# Patient Record
Sex: Male | Born: 1965
Health system: Southern US, Community
[De-identification: ages and names within clinical notes are randomized; demographics above are authoritative.]

## PROBLEM LIST (undated history)

## (undated) DIAGNOSIS — C799 Secondary malignant neoplasm of unspecified site: Principal | ICD-10-CM

## (undated) DIAGNOSIS — C787 Secondary malignant neoplasm of liver and intrahepatic bile duct: Secondary | ICD-10-CM

## (undated) DIAGNOSIS — C160 Malignant neoplasm of cardia: Principal | ICD-10-CM

## (undated) DIAGNOSIS — Z7189 Other specified counseling: Secondary | ICD-10-CM

## (undated) DIAGNOSIS — D500 Iron deficiency anemia secondary to blood loss (chronic): Secondary | ICD-10-CM

## (undated) HISTORY — DX: Iron deficiency anemia secondary to blood loss (chronic): D50.0

## (undated) HISTORY — DX: Malignant neoplasm of cardia: C16.0

## (undated) HISTORY — DX: Secondary malignant neoplasm of unspecified site: C79.9

## (undated) HISTORY — DX: Other specified counseling: Z71.89

## (undated) HISTORY — DX: Secondary malignant neoplasm of liver and intrahepatic bile duct: C78.7

---

## 2015-12-20 ENCOUNTER — Ambulatory Visit: Payer: Self-pay | Admitting: General Surgery

## 2016-01-08 NOTE — Pre-Procedure Instructions (Addendum)
Matthew Reynolds  01/08/2016      [id number] [place], Matthew [place] Phone: [phone] Fax: [phone]    Your procedure is scheduled on Monday, October 30.  Report to [facility] Admitting at 5:30 AM                 Your surgery or procedure is scheduled for 7:30 AM   Call this number if you have problems the morning of [phone]                  For any other questions, please call [phone], Monday - Friday 8 AM - 4 PM.   Remember:  Do not eat food or drink liquids after midnight Sunday, October 30.  Take these medicines the morning of surgery with A SIP OF WATER: cetirizine (ZYRTEC).                 [place]- Preparing For Surgery  Before surgery, you can play an important role. Because skin is not sterile, your skin needs to be as free of germs as possible. You can reduce the number of germs on your skin by washing with CHG (chlorahexidine gluconate) Soap before surgery.  CHG is an antiseptic cleaner which kills germs and bonds with the skin to continue killing germs even after washing.  Please do not use if you have an allergy to CHG or antibacterial soaps. If your skin becomes reddened/irritated stop using the CHG.  Do not shave (including legs and underarms) for at least 48 hours prior to first CHG shower. It is OK to shave your face.  Please follow these instructions carefully.   1. Shower the NIGHT BEFORE SURGERY and the MORNING OF SURGERY with CHG.   2. If you chose to wash your hair, wash your hair first as usual with your normal shampoo.  3. After you shampoo, rinse your hair and body thoroughly to remove the shampoo.  4. Use CHG as you would any other liquid soap. You can apply CHG directly to the skin and wash gently with a scrungie or a clean washcloth.   5. Apply the CHG Soap to your body ONLY FROM THE NECK DOWN.  Do not use on open wounds or open sores. Avoid  contact with your eyes, ears, mouth and genitals (private parts). Wash genitals (private parts) with your normal soap.  6. Wash thoroughly, paying special attention to the area where your surgery will be performed.  7. Thoroughly rinse your body with warm water from the neck down.  8. DO NOT shower/wash with your normal soap after using and rinsing off the CHG Soap.  9. Pat yourself dry with a CLEAN TOWEL.   10. Wear CLEAN PAJAMAS   11. Place CLEAN SHEETS on your bed the night of your first shower and DO NOT SLEEP WITH PETS. Day of Surgery: Do not apply any deodorants/lotions. Please wear clean clothes to the hospital/surgery center.     Do not wear jewelry, make-up or nail polish.  Do not wear lotions, powders, or perfumes, or deodoorant.             Men may shave face and neck.  Do not bring valuables to the hospital.  [facility] is not responsible for any belongings or valuables.  Contacts, dentures or bridgework may not be worn into surgery.  Leave your suitcase in the car.  After surgery it  may be brought to your room.  For patients admitted to the hospital, discharge time will be determined by your treatment team.  Patients discharged the day of surgery will not be allowed to drive home.   Name and phone number of your driver: Matthew Reynolds, spouse Special instructions-  Please read over the following fact sheets that you were given: Coughing and Deep Breathing

## 2016-01-08 NOTE — Pre-Procedure Instructions (Signed)
LEBARON BAUTCH  01/08/2016      [facility] [place], [place] [address] [place] [address] Phone: [phone] Fax: [phone]    Your procedure is scheduled on Monday, October 30.  Report to [facility] Admitting at 5:30 AM .  Call this number if you have problems the morning of surgery:[phone]                For any other questions, please call [phone], Monday - Friday 8 AM - 4 PM.  Remember:  Do not eat food or drink liquids after midnight Sunday 29.  Take these medicines the morning of surgery with A SIP OF WATER: Take if needed:cetirizine (ZYRTEC).                 1 Week prior to surgery STOP taking Aspirin , Aspirin Products (Goody Powder, Excedrin Migraine), Ibuprofen (Advil), Naproxen (Aleve), Vitamins and Herbal Products (ie Fish Oil)                 [place]- Preparing For Surgery  Before surgery, you can play an important role. Because skin is not sterile, your skin needs to be as free of germs as possible. You can reduce the number of germs on your skin by washing with CHG (chlorahexidine gluconate) Soap before surgery.  CHG is an antiseptic cleaner which kills germs and bonds with the skin to continue killing germs even after washing.  Please do not use if you have an allergy to CHG or antibacterial soaps. If your skin becomes reddened/irritated stop using the CHG.  Do not shave (including legs and underarms) for at least 48 hours prior to first CHG shower. It is OK to shave your face.  Please follow these instructions carefully.   1. Shower the NIGHT BEFORE SURGERY and the MORNING OF SURGERY with CHG.   2. If you chose to wash your hair, wash your hair first as usual with your normal shampoo.  3. After you shampoo, rinse your hair and body thoroughly to remove the shampoo.  4. Use CHG as you would any other liquid soap. You can apply CHG directly to the skin and wash gently with a scrungie or a  clean washcloth.   5. Apply the CHG Soap to your body ONLY FROM THE NECK DOWN.  Do not use on open wounds or open sores. Avoid contact with your eyes, ears, mouth and genitals (private parts). Wash genitals (private parts) with your normal soap.  6. Wash thoroughly, paying special attention to the area where your surgery will be performed.  7. Thoroughly rinse your body with warm water from the neck down.  8. DO NOT shower/wash with your normal soap after using and rinsing off the CHG Soap.  9. Pat yourself dry with a CLEAN TOWEL.   10. Wear CLEAN PAJAMAS   11. Place CLEAN SHEETS on your bed the night of your first shower and DO NOT SLEEP WITH PETS. 12.  Day of Surgery: Do not apply any deodorants/lotions. Please wear clean clothes to the hospital/surgery center.     Do not wear jewelry, make-up or nail polish.  Do not wear lotions, powders, or perfumes, or deodorant.   Men may shave face and neck.  Do not bring valuables to the hospital.  [facility] is not responsible for any belongings or valuables.  Contacts, dentures or bridgework may not be worn into surgery.  Leave your suitcase  in the car.  After surgery it may be brought to your room.  For patients admitted to the hospital, discharge time will be determined by your treatment team.  Patients discharged the day of surgery will not be allowed to drive home.  Name and phone number of your driver:    Special instructions:-   Please read over the following fact sheets that you were given

## 2016-01-09 ENCOUNTER — Encounter (HOSPITAL_COMMUNITY): Payer: Self-pay

## 2016-01-09 ENCOUNTER — Encounter (HOSPITAL_COMMUNITY)
Admission: RE | Admit: 2016-01-09 | Discharge: 2016-01-09 | Disposition: A | Discharge status: Home / Self Care | Payer: 59 | Source: Ambulatory Visit | Attending: General Surgery | Admitting: General Surgery

## 2016-01-09 DIAGNOSIS — L723 Sebaceous cyst: Secondary | ICD-10-CM | POA: Diagnosis not present

## 2016-01-09 LAB — CBC
HEMATOCRIT: 51.3 % (ref 39.0–52.0)
Hemoglobin: 17.9 g/dL — ABNORMAL HIGH (ref 13.0–17.0)
MCH: 30.5 pg (ref 26.0–34.0)
MCHC: 34.9 g/dL (ref 30.0–36.0)
MCV: 87.5 fL (ref 78.0–100.0)
Platelets: 168 10*3/uL (ref 150–400)
RBC: 5.86 MIL/uL — ABNORMAL HIGH (ref 4.22–5.81)
RDW: 12.9 % (ref 11.5–15.5)
WBC: 4.6 10*3/uL (ref 4.0–10.5)

## 2016-01-09 NOTE — Progress Notes (Signed)
PCP:Dr. Theadore Nan @ [facility] [address]

## 2016-01-15 ENCOUNTER — Encounter (HOSPITAL_COMMUNITY)
Admission: RE | Disposition: A | Discharge status: Home / Self Care | Payer: Self-pay | Source: Ambulatory Visit | Attending: General Surgery

## 2016-01-15 ENCOUNTER — Ambulatory Visit (HOSPITAL_COMMUNITY): Payer: 59 | Admitting: Vascular Surgery

## 2016-01-15 ENCOUNTER — Ambulatory Visit (HOSPITAL_COMMUNITY): Payer: 59 | Admitting: Anesthesiology

## 2016-01-15 ENCOUNTER — Ambulatory Visit (HOSPITAL_COMMUNITY)
Admission: RE | Admit: 2016-01-15 | Discharge: 2016-01-15 | Disposition: A | Discharge status: Home / Self Care | Payer: 59 | Source: Ambulatory Visit | Attending: General Surgery | Admitting: General Surgery

## 2016-01-15 ENCOUNTER — Encounter (HOSPITAL_COMMUNITY): Payer: Self-pay | Admitting: Anesthesiology

## 2016-01-15 DIAGNOSIS — L723 Sebaceous cyst: Secondary | ICD-10-CM | POA: Diagnosis present

## 2016-01-15 HISTORY — PX: CYST EXCISION: SHX5701

## 2016-01-15 SURGERY — CYST REMOVAL
Anesthesia: General | Site: Back | Laterality: Left

## 2016-01-15 MED ORDER — FENTANYL CITRATE (PF) 100 MCG/2ML IJ SOLN
INTRAMUSCULAR | Status: AC
  Filled 2016-01-15: qty 2

## 2016-01-15 MED ORDER — ONDANSETRON HCL 4 MG/2ML IJ SOLN
INTRAMUSCULAR | Status: DC | PRN
  Administered 2016-01-15: 4 mg via INTRAVENOUS

## 2016-01-15 MED ORDER — FENTANYL CITRATE (PF) 100 MCG/2ML IJ SOLN: 25.0000 ug | INTRAMUSCULAR | Status: DC | PRN

## 2016-01-15 MED ORDER — PROPOFOL 10 MG/ML IV BOLUS
INTRAVENOUS | Status: AC
  Filled 2016-01-15: qty 20

## 2016-01-15 MED ORDER — CEFAZOLIN SODIUM-DEXTROSE 2-4 GM/100ML-% IV SOLN
2.0000 g | INTRAVENOUS | Status: AC
  Administered 2016-01-15: 2 g via INTRAVENOUS
  Filled 2016-01-15: qty 100

## 2016-01-15 MED ORDER — OXYCODONE HCL 5 MG PO TABS: 5.0000 mg | ORAL_TABLET | Freq: Four times a day (QID) | ORAL | 0 refills | Status: DC | PRN

## 2016-01-15 MED ORDER — OXYCODONE HCL 5 MG PO TABS: 5.0000 mg | ORAL_TABLET | Freq: Once | ORAL | Status: DC | PRN

## 2016-01-15 MED ORDER — BUPIVACAINE HCL (PF) 0.25 % IJ SOLN
INTRAMUSCULAR | Status: AC
  Filled 2016-01-15: qty 30

## 2016-01-15 MED ORDER — LIDOCAINE HCL (CARDIAC) 20 MG/ML IV SOLN
INTRAVENOUS | Status: DC | PRN
  Administered 2016-01-15: 60 mg via INTRAVENOUS

## 2016-01-15 MED ORDER — CHLORHEXIDINE GLUCONATE CLOTH 2 % EX PADS: 6.0000 | MEDICATED_PAD | Freq: Once | CUTANEOUS | Status: DC

## 2016-01-15 MED ORDER — MIDAZOLAM HCL 5 MG/5ML IJ SOLN
INTRAMUSCULAR | Status: DC | PRN
  Administered 2016-01-15: 2 mg via INTRAVENOUS

## 2016-01-15 MED ORDER — MIDAZOLAM HCL 2 MG/2ML IJ SOLN
INTRAMUSCULAR | Status: AC
  Filled 2016-01-15: qty 2

## 2016-01-15 MED ORDER — LACTATED RINGERS IV SOLN
INTRAVENOUS | Status: DC | PRN
  Administered 2016-01-15: 07:00:00 via INTRAVENOUS

## 2016-01-15 MED ORDER — SUGAMMADEX SODIUM 200 MG/2ML IV SOLN
INTRAVENOUS | Status: AC
  Filled 2016-01-15: qty 2

## 2016-01-15 MED ORDER — EPHEDRINE 5 MG/ML INJ
INTRAVENOUS | Status: AC
  Filled 2016-01-15: qty 10

## 2016-01-15 MED ORDER — BUPIVACAINE-EPINEPHRINE 0.25% -1:200000 IJ SOLN
INTRAMUSCULAR | Status: DC | PRN
  Administered 2016-01-15: 20 mL

## 2016-01-15 MED ORDER — FENTANYL CITRATE (PF) 100 MCG/2ML IJ SOLN
INTRAMUSCULAR | Status: DC | PRN
  Administered 2016-01-15: 100 ug via INTRAVENOUS
  Administered 2016-01-15: 50 ug via INTRAVENOUS

## 2016-01-15 MED ORDER — SUGAMMADEX SODIUM 200 MG/2ML IV SOLN
INTRAVENOUS | Status: DC | PRN
Start: 2016-01-15 — End: 2016-01-15
  Administered 2016-01-15: 200 mg via INTRAVENOUS

## 2016-01-15 MED ORDER — PROPOFOL 10 MG/ML IV BOLUS
INTRAVENOUS | Status: DC | PRN
  Administered 2016-01-15: 200 mg via INTRAVENOUS

## 2016-01-15 MED ORDER — 0.9 % SODIUM CHLORIDE (POUR BTL) OPTIME
TOPICAL | Status: DC | PRN
  Administered 2016-01-15: 1000 mL

## 2016-01-15 MED ORDER — OXYCODONE HCL 5 MG/5ML PO SOLN: 5.0000 mg | Freq: Once | ORAL | Status: DC | PRN

## 2016-01-15 MED ORDER — ARTIFICIAL TEARS OP OINT
TOPICAL_OINTMENT | OPHTHALMIC | Status: AC
  Filled 2016-01-15: qty 3.5

## 2016-01-15 MED ORDER — ROCURONIUM BROMIDE 10 MG/ML (PF) SYRINGE
PREFILLED_SYRINGE | INTRAVENOUS | Status: AC
  Filled 2016-01-15: qty 10

## 2016-01-15 MED ORDER — LIDOCAINE 2% (20 MG/ML) 5 ML SYRINGE
INTRAMUSCULAR | Status: AC
  Filled 2016-01-15: qty 5

## 2016-01-15 MED ORDER — ARTIFICIAL TEARS OP OINT
TOPICAL_OINTMENT | OPHTHALMIC | Status: DC | PRN
  Administered 2016-01-15: 1 via OPHTHALMIC

## 2016-01-15 MED ORDER — ONDANSETRON HCL 4 MG/2ML IJ SOLN
INTRAMUSCULAR | Status: AC
  Filled 2016-01-15: qty 2

## 2016-01-15 MED ORDER — ROCURONIUM BROMIDE 100 MG/10ML IV SOLN
INTRAVENOUS | Status: DC | PRN
  Administered 2016-01-15: 40 mg via INTRAVENOUS

## 2016-01-15 SURGICAL SUPPLY — 36 items
BLADE SURG ROTATE 9660 (MISCELLANEOUS) IMPLANT
CANISTER SUCTION 2500CC (MISCELLANEOUS) ×2 IMPLANT
CHLORAPREP W/TINT 26ML (MISCELLANEOUS) ×2 IMPLANT
COVER SURGICAL LIGHT HANDLE (MISCELLANEOUS) ×2 IMPLANT
DECANTER SPIKE VIAL GLASS SM (MISCELLANEOUS) ×2 IMPLANT
DERMABOND ADHESIVE PROPEN (GAUZE/BANDAGES/DRESSINGS) ×1
DERMABOND ADVANCED .7 DNX6 (GAUZE/BANDAGES/DRESSINGS) ×1 IMPLANT
DRAPE HALF SHEET 40X57 (DRAPES) ×2 IMPLANT
DRAPE LAPAROSCOPIC ABDOMINAL (DRAPES) ×2 IMPLANT
DRAPE UTILITY XL STRL (DRAPES) IMPLANT
ELECT CAUTERY BLADE 6.4 (BLADE) ×2 IMPLANT
ELECT REM PT RETURN 9FT ADLT (ELECTROSURGICAL) ×2
ELECTRODE REM PT RTRN 9FT ADLT (ELECTROSURGICAL) ×1 IMPLANT
GAUZE SPONGE 4X4 12PLY STRL (GAUZE/BANDAGES/DRESSINGS) ×2 IMPLANT
GLOVE BIO SURGEON STRL SZ8 (GLOVE) ×2 IMPLANT
GLOVE BIOGEL PI IND STRL 8 (GLOVE) ×1 IMPLANT
GLOVE BIOGEL PI INDICATOR 8 (GLOVE) ×1
GOWN STRL REUS W/ TWL LRG LVL3 (GOWN DISPOSABLE) ×1 IMPLANT
GOWN STRL REUS W/ TWL XL LVL3 (GOWN DISPOSABLE) ×1 IMPLANT
GOWN STRL REUS W/TWL LRG LVL3 (GOWN DISPOSABLE) ×1
GOWN STRL REUS W/TWL XL LVL3 (GOWN DISPOSABLE) ×1
KIT BASIN OR (CUSTOM PROCEDURE TRAY) ×2 IMPLANT
KIT ROOM TURNOVER OR (KITS) ×2 IMPLANT
NEEDLE 22X1 1/2 (OR ONLY) (NEEDLE) ×2 IMPLANT
NS IRRIG 1000ML POUR BTL (IV SOLUTION) ×2 IMPLANT
PACK GENERAL/GYN (CUSTOM PROCEDURE TRAY) ×2 IMPLANT
PAD ARMBOARD 7.5X6 YLW CONV (MISCELLANEOUS) ×6 IMPLANT
SPECIMEN JAR MEDIUM (MISCELLANEOUS) ×2 IMPLANT
SUT MNCRL AB 4-0 PS2 18 (SUTURE) ×2 IMPLANT
SUT VIC AB 3-0 SH 27 (SUTURE) ×1
SUT VIC AB 3-0 SH 27X BRD (SUTURE) ×1 IMPLANT
SUT VICRYL AB 3 0 TIES (SUTURE) IMPLANT
SYR CONTROL 10ML LL (SYRINGE) IMPLANT
TOWEL OR 17X24 6PK STRL BLUE (TOWEL DISPOSABLE) IMPLANT
TOWEL OR 17X26 10 PK STRL BLUE (TOWEL DISPOSABLE) ×2 IMPLANT
WATER STERILE IRR 1000ML POUR (IV SOLUTION) ×2 IMPLANT

## 2016-01-15 NOTE — Anesthesia Preprocedure Evaluation (Addendum)
Anesthesia Evaluation  Patient identified by MRN, date of birth, ID band Patient awake    Reviewed: Allergy & Precautions, NPO status , Patient's Chart, lab work & pertinent test results  Airway Mallampati: I   Neck ROM: Full    Dental  (+) Teeth Intact, Dental Advisory Given   Pulmonary    breath sounds clear to auscultation       Cardiovascular  Rhythm:Regular     Neuro/Psych    GI/Hepatic   Endo/Other    Renal/GU      Musculoskeletal   Abdominal   Peds  Hematology   Anesthesia Other Findings   Reproductive/Obstetrics                            Anesthesia Physical Anesthesia Plan  ASA: II  Anesthesia Plan: General   Post-op Pain Management:    Induction: Intravenous  Airway Management Planned: Oral ETT  Additional Equipment: None  Intra-op Plan:   Post-operative Plan: Extubation in OR  Informed Consent: I have reviewed the patients History and Physical, chart, labs and discussed the procedure including the risks, benefits and alternatives for the proposed anesthesia with the patient or authorized representative who has indicated his/her understanding and acceptance.   Dental advisory given  Plan Discussed with: CRNA and Surgeon  Anesthesia Plan Comments:         Anesthesia Quick Evaluation

## 2016-01-15 NOTE — Transfer of Care (Signed)
Immediate Anesthesia Transfer of Care Note  Patient: Matthew Reynolds  Procedure(s) Performed: Procedure(s): EXCISION SEBACEOUS CYST LEFT UPPER BACK (Left)  Patient Location: PACU  Anesthesia Type:General  Level of Consciousness: awake, alert  and oriented  Airway & Oxygen Therapy: Patient Spontanous Breathing and Patient connected to nasal cannula oxygen  Post-op Assessment: Report given to RN, Post -op Vital signs reviewed and stable and Patient moving all extremities X 4  Post vital signs: Reviewed and stable  Last Vitals:  Vitals:   01/15/16 0607  BP: (!) 153/93  Pulse: 64  Resp: 20  Temp: 36.8 C    Last Pain:  Vitals:   01/15/16 0607  TempSrc: Oral      Patients Stated Pain Goal: 4 (90/93/11 2162)  Complications: No apparent anesthesia complications

## 2016-01-15 NOTE — H&P (Signed)
Matthew Reynolds 12/20/2015 10:50 AM Location: [facility] Patient #: [id number] DOB: Feb 13, 1966 Married / Language: English / Race: White Male   History of Present Illness Matthew Neri E. Grandville Silos MD; 12/20/2015 11:00 AM) The patient is a 50 year old male who presents with a complaint of Mass. I was asked to see Othell in consultation by Dr. Addison Lank regarding a sebaceous cyst on his upper back. This is on the left side of his upper back. His neck. He has had for many years. It is gradually gotten larger. It is becoming more uncomfortable. It has never become infected nor drained anything from the skin. 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 Frequently when he has some cervical strain related to work, this exacerbates the discomfort at the cyst site.   Allergies Elbert Ewings, CMA; 12/20/2015 10:51 AM) No Known Drug Allergies10/06/2015  Medication History Elbert Ewings, [place]; 12/20/2015 10:51 AM) No Current Medications Medications Reconciled    Review of Systems Elbert Ewings CMA; 12/20/2015 10:51 AM) General Not Present- Appetite Loss, Chills, Fatigue, Fever, Night Sweats, Weight Gain and Weight Loss. Skin Not Present- Change in Wart/Mole, Dryness, Hives, Jaundice, New Lesions, Non-Healing Wounds, Rash and Ulcer. HEENT Present- Seasonal Allergies and Wears glasses/contact lenses. Not Present- Earache, Hearing Loss, Hoarseness, Nose Bleed, Oral Ulcers, Ringing in the Ears, Sinus Pain, Sore Throat, Visual Disturbances and Yellow Eyes. Respiratory Not Present- Bloody sputum, Chronic Cough, Difficulty Breathing, Snoring and Wheezing. Breast Not Present- Breast Mass, Breast Pain, Nipple Discharge and Skin Changes. Cardiovascular Not Present- Chest Pain, Difficulty Breathing Lying Down, Leg Cramps, Palpitations, Rapid Heart Rate, Shortness of Breath and Swelling of Extremities. Gastrointestinal Not Present- Abdominal Pain, Bloating, Bloody Stool, Change in Bowel Habits, Chronic  diarrhea, Constipation, Difficulty Swallowing, Excessive gas, Gets full quickly at meals, Hemorrhoids, Indigestion, Nausea, Rectal Pain and Vomiting. Male Genitourinary Not Present- Blood in Urine, Change in Urinary Stream, Frequency, Impotence, Nocturia, Painful Urination, Urgency and Urine Leakage. Musculoskeletal Not Present- Back Pain, Joint Pain, Joint Stiffness, Muscle Pain, Muscle Weakness and Swelling of Extremities. Neurological Not Present- Decreased Memory, Fainting, Headaches, Numbness, Seizures, Tingling, Tremor, Trouble walking and Weakness. Psychiatric Not Present- Anxiety, Bipolar, Change in Sleep Pattern, Depression, Fearful and Frequent crying. Endocrine Not Present- Cold Intolerance, Excessive Hunger, Hair Changes, Heat Intolerance, Hot flashes and New Diabetes. Hematology Not Present- Blood Thinners, Easy Bruising, Excessive bleeding, Gland problems, HIV and Persistent Infections.  Vitals Elbert Ewings CMA; 12/20/2015 10:51 AM) 12/20/2015 10:51 AM Weight: 222 lb Height: 69in Body Surface Area: 2.16 m Body Mass Index: 32.78 kg/m  Temp.: 98.30F(Temporal)  Pulse: 95 (Regular)  BP: 128/86 (Sitting, Left Arm, Standard)       Physical Exam Matthew Neri E. Grandville Silos MD; 12/20/2015 11:00 AM) General Mental Status-Alert. General Appearance-Consistent with stated age. Hydration-Well hydrated. Voice-Normal.  Integumentary Note: Upper back on the left side near the base of the stomach has a 3 cm subcutaneous mass, no overlying cellulitis, no drainage   Head and Neck Head-normocephalic, atraumatic with no lesions or palpable masses. Trachea-midline. Thyroid Gland Characteristics - normal size and consistency.  Eye Eyeball - Bilateral-Extraocular movements intact. Sclera/Conjunctiva - Bilateral-No scleral icterus.  Chest and Lung Exam Chest and lung exam reveals -quiet, even and easy respiratory effort with no use of accessory muscles and on  auscultation, normal breath sounds, no adventitious sounds and normal vocal resonance. Inspection Chest Wall - Normal. Back - normal.  Cardiovascular Cardiovascular examination reveals -normal heart sounds, regular rate and rhythm with no murmurs and normal pedal pulses  bilaterally.  Abdomen Inspection Inspection of the abdomen reveals - No Hernias. Palpation/Percussion Palpation and Percussion of the abdomen reveal - Soft, Non Tender, No Rebound tenderness, No Rigidity (guarding) and No hepatosplenomegaly. Auscultation Auscultation of the abdomen reveals - Bowel sounds normal.  Neurologic Neurologic evaluation reveals -alert and oriented x 3 with no impairment of recent or remote memory. Mental Status-Normal.  Musculoskeletal Global Assessment -Note: no gross deformities.  Normal Exam - Left-Upper Extremity Strength Normal and Lower Extremity Strength Normal. Normal Exam - Right-Upper Extremity Strength Normal and Lower Extremity Strength Normal.  Lymphatic Head & Neck  General Head & Neck Lymphatics: Bilateral - Description - Normal. Axillary  General Axillary Region: Bilateral - Description - Normal. Tenderness - Non Tender. Femoral & Inguinal  Generalized Femoral & Inguinal Lymphatics: Bilateral - Description - No Generalized lymphadenopathy.    Assessment & Plan Matthew Neri E. Grandville Silos MD; 12/20/2015 11:01 AM) SEBACEOUS CYST (L72.3) Impression: Upper left back. I have offered excision under general anesthesia. Procedure, risks, and benefits were discussed in detail with him. He is agreeable. We discussed the expected postoperative course and I answered his questions.

## 2016-01-15 NOTE — Interval H&P Note (Signed)
History and Physical Interval Note:  01/15/2016 6:59 AM  Matthew Reynolds  has presented today for surgery, with the diagnosis of sebaceous cyst left upper back  The various methods of treatment have been discussed with the patient and family. After consideration of risks, benefits and other options for treatment, the patient has consented to  Procedure(s): EXCISION SEBACEOUS CYST UPPER BACK (N/A) as a surgical intervention .  The patient's history has been reviewed, patient examined, no change in status, stable for surgery.  I have reviewed the patient's chart and labs.  Questions were answered to the patient's satisfaction.     Graydon Fofana E

## 2016-01-15 NOTE — Op Note (Signed)
01/15/2016  8:07 AM  PATIENT:  Matthew Reynolds  50 y.o. male  PRE-OPERATIVE DIAGNOSIS:  sebaceous cyst left upper back  POST-OPERATIVE DIAGNOSIS:  sebaceous cyst left upper back  PROCEDURE:  Procedure(s): EXCISION SEBACEOUS CYST LEFT UPPER BACK 3CM WITH LAYERED CLOSURE  SURGEON:  Surgeon(s): Georganna Skeans, MD  ASSISTANTS: none   ANESTHESIA:   local and general  EBL:  Total I/O In: -  Out: 5 [Blood:5]  BLOOD ADMINISTERED:none  DRAINS: none   SPECIMEN:  Excision  DISPOSITION OF SPECIMEN:  PATHOLOGY  COUNTS:  YES  DICTATION: .Dragon Dictation [name] presents for excision sebaceous cyst left upper back. Informed consent was obtained. His site was marked. He received intravenous antibiotics. He was brought to operating room and general endotracheal anesthesia was administered by the anesthesia staff. He was placed in prone position. His upper back was prepped and draped in sterile fashion. We did time out procedure. Local anesthetic was injected around the cyst area. A transverse incision was made. Subcutaneous tissues were dissected down revealing the cyst wall. This was circumferentially dissected and excised in its entirety. It was sent to pathology. The wound was copiously irrigated. Hemostasis was ensured. Some additional local was injected. Deep tissues were approximated with interrupted 3-0 Vicryl. The skin was closed with running 4-0 Monocryl followed by Dermabond. All counts were correct. He tolerated the procedure well without apparent complication was taken recovery in stable condition. PATIENT DISPOSITION:  PACU - hemodynamically stable.   Delay start of Pharmacological VTE agent (>24hrs) due to surgical blood loss or risk of bleeding:  no  Georganna Skeans, MD, MPH, FACS Pager: [phone]  10/30/20178:07 AM

## 2016-01-15 NOTE — Anesthesia Procedure Notes (Signed)
Procedure Name: Intubation Date/Time: 01/15/2016 7:33 AM Performed by: Suzy Bouchard Pre-anesthesia Checklist: Patient identified, Emergency Drugs available, Suction available, Timeout performed and Patient being monitored Patient Re-evaluated:Patient Re-evaluated prior to inductionOxygen Delivery Method: Circle system utilized Preoxygenation: Pre-oxygenation with 100% oxygen Intubation Type: IV induction Ventilation: Mask ventilation without difficulty Laryngoscope Size: Miller and 2 Grade View: Grade I Tube type: Oral Laser Tube: Cuffed inflated with minimal occlusive pressure - saline Tube size: 7.5 mm Number of attempts: 1 Airway Equipment and Method: Stylet Placement Confirmation: ETT inserted through vocal cords under direct vision,  positive ETCO2 and breath sounds checked- equal and bilateral Secured at: 23 cm Tube secured with: Tape Dental Injury: Teeth and Oropharynx as per pre-operative assessment

## 2016-01-16 ENCOUNTER — Encounter (HOSPITAL_COMMUNITY): Payer: Self-pay | Admitting: General Surgery

## 2016-01-16 NOTE — Anesthesia Postprocedure Evaluation (Signed)
Anesthesia Post Note  Patient: Matthew Reynolds  Procedure(s) Performed: Procedure(s) (LRB): EXCISION SEBACEOUS CYST LEFT UPPER BACK (Left)  Patient location during evaluation: PACU Anesthesia Type: General Level of consciousness: awake Pain management: pain level controlled Vital Signs Assessment: post-procedure vital signs reviewed and stable Respiratory status: spontaneous breathing Cardiovascular status: stable Postop Assessment: no signs of nausea or vomiting Anesthetic complications: no    Last Vitals:  Vitals:   01/15/16 0900 01/15/16 0909  BP:  (!) 125/95  Pulse: 76 73  Resp: 19 20  Temp: 36.5 C     Last Pain:  Vitals:   01/15/16 0607  TempSrc: Oral                 Robby Bulkley

## 2016-06-19 DIAGNOSIS — S39012A Strain of muscle, fascia and tendon of lower back, initial encounter: Secondary | ICD-10-CM | POA: Diagnosis not present

## 2016-12-19 DIAGNOSIS — E7800 Pure hypercholesterolemia, unspecified: Secondary | ICD-10-CM | POA: Diagnosis not present

## 2016-12-19 DIAGNOSIS — Z125 Encounter for screening for malignant neoplasm of prostate: Secondary | ICD-10-CM | POA: Diagnosis not present

## 2016-12-24 DIAGNOSIS — Z0000 Encounter for general adult medical examination without abnormal findings: Secondary | ICD-10-CM | POA: Diagnosis not present

## 2016-12-24 DIAGNOSIS — Z23 Encounter for immunization: Secondary | ICD-10-CM | POA: Diagnosis not present

## 2017-09-17 DIAGNOSIS — E7800 Pure hypercholesterolemia, unspecified: Secondary | ICD-10-CM | POA: Diagnosis not present

## 2017-10-30 DIAGNOSIS — E7800 Pure hypercholesterolemia, unspecified: Secondary | ICD-10-CM | POA: Diagnosis not present

## 2017-11-13 ENCOUNTER — Other Ambulatory Visit: Payer: Self-pay

## 2017-11-13 ENCOUNTER — Encounter (HOSPITAL_BASED_OUTPATIENT_CLINIC_OR_DEPARTMENT_OTHER): Payer: Self-pay

## 2017-11-13 ENCOUNTER — Emergency Department (HOSPITAL_BASED_OUTPATIENT_CLINIC_OR_DEPARTMENT_OTHER)
Admission: EM | Admit: 2017-11-13 | Discharge: 2017-11-14 | Disposition: A | Discharge status: Home / Self Care | Payer: 59 | Attending: Emergency Medicine | Admitting: Emergency Medicine

## 2017-11-13 ENCOUNTER — Emergency Department (HOSPITAL_BASED_OUTPATIENT_CLINIC_OR_DEPARTMENT_OTHER): Payer: 59

## 2017-11-13 DIAGNOSIS — R1031 Right lower quadrant pain: Secondary | ICD-10-CM | POA: Insufficient documentation

## 2017-11-13 DIAGNOSIS — K5900 Constipation, unspecified: Secondary | ICD-10-CM | POA: Insufficient documentation

## 2017-11-13 DIAGNOSIS — K228 Other specified diseases of esophagus: Secondary | ICD-10-CM | POA: Insufficient documentation

## 2017-11-13 DIAGNOSIS — Z79899 Other long term (current) drug therapy: Secondary | ICD-10-CM | POA: Insufficient documentation

## 2017-11-13 DIAGNOSIS — R109 Unspecified abdominal pain: Secondary | ICD-10-CM

## 2017-11-13 LAB — CBC
HEMATOCRIT: 49.2 % (ref 39.0–52.0)
Hemoglobin: 17.4 g/dL — ABNORMAL HIGH (ref 13.0–17.0)
MCH: 28.3 pg (ref 26.0–34.0)
MCHC: 35.4 g/dL (ref 30.0–36.0)
MCV: 80.1 fL (ref 78.0–100.0)
Platelets: 230 10*3/uL (ref 150–400)
RBC: 6.14 MIL/uL — ABNORMAL HIGH (ref 4.22–5.81)
RDW: 12.6 % (ref 11.5–15.5)
WBC: 10 10*3/uL (ref 4.0–10.5)

## 2017-11-13 LAB — URINALYSIS, ROUTINE W REFLEX MICROSCOPIC
Bilirubin Urine: NEGATIVE
GLUCOSE, UA: NEGATIVE mg/dL
Hgb urine dipstick: NEGATIVE
Ketones, ur: >80 mg/dL — AB
Leukocytes, UA: NEGATIVE
Nitrite: NEGATIVE
PROTEIN: NEGATIVE mg/dL
Specific Gravity, Urine: 1.015 (ref 1.005–1.030)
pH: 8.5 — ABNORMAL HIGH (ref 5.0–8.0)

## 2017-11-13 MED ORDER — KETOROLAC TROMETHAMINE 30 MG/ML IJ SOLN
30.0000 mg | Freq: Once | INTRAMUSCULAR | Status: AC
  Administered 2017-11-13: 30 mg via INTRAVENOUS
  Filled 2017-11-13: qty 1

## 2017-11-13 MED ORDER — MORPHINE SULFATE (PF) 4 MG/ML IV SOLN
4.0000 mg | Freq: Once | INTRAVENOUS | Status: AC
  Administered 2017-11-13: 4 mg via INTRAVENOUS
  Filled 2017-11-13: qty 1

## 2017-11-13 MED ORDER — ONDANSETRON HCL 4 MG/2ML IJ SOLN
4.0000 mg | Freq: Once | INTRAMUSCULAR | Status: AC | PRN
  Administered 2017-11-13: 4 mg via INTRAVENOUS
  Filled 2017-11-13: qty 2

## 2017-11-13 NOTE — ED Triage Notes (Signed)
C/o right side abd.pain/flank pain x today-recent issues with constipation after starting new med-NAD-steady gait

## 2017-11-13 NOTE — ED Provider Notes (Signed)
[facility] DEPARTMENT Provider Note   CSN: [id number] Arrival date & time: 11/13/17  2139     History   Chief Complaint Chief Complaint  Patient presents with  . Abdominal Pain    HPI Matthew Reynolds is a 52 y.o. male.  Patient is a 52 year old male with no significant past medical history.  He presents for evaluation of right flank and abdominal pain.  This started earlier this afternoon in the absence of any injury or trauma.  He reports the sudden onset of pain to the right lower quadrant but radiates into his right flank.  He feels nauseated but has not vomited.  He does report recent constipation for which she has been taking different medications.  He denies any fevers or chills.  He denies any urinary complaints.  The history is provided by the patient.  Flank Pain  This is a new problem. Episode onset: Earlier this afternoon. The problem occurs constantly. The problem has been rapidly worsening. Associated symptoms include abdominal pain. Nothing relieves the symptoms. He has tried nothing for the symptoms.    History reviewed. No pertinent past medical history.  There are no active problems to display for this patient.   Past Surgical History:  Procedure Laterality Date  . CYST EXCISION Left 01/15/2016   Procedure: EXCISION SEBACEOUS CYST LEFT UPPER BACK;  Surgeon: Georganna Skeans, MD;  Location: [facility];  Service: General;  Laterality: Left;        Home Medications    Prior to Admission medications   Medication Sig Start Date End Date Taking? Authorizing Provider  cetirizine (ZYRTEC) 10 MG tablet Take 10 mg by mouth daily as needed for allergies.    [provider]  oxyCODONE (OXY IR/ROXICODONE) 5 MG immediate release tablet Take 1-2 tablets (5-10 mg total) by mouth every 6 (six) hours as needed for moderate pain or severe pain. 01/15/16   Georganna Skeans, MD    Family History No family history on file.  Social History Social  History   Tobacco Use  . Smoking status: Never Smoker  . Smokeless tobacco: Never Used  Substance Use Topics  . Alcohol use: Yes    Comment: weekly  . Drug use: No     Allergies   Patient has no known allergies.   Review of Systems Review of Systems  Gastrointestinal: Positive for abdominal pain.  Genitourinary: Positive for flank pain.  All other systems reviewed and are negative.    Physical Exam Updated Vital Signs BP (!) 143/100 (BP Location: Right Arm)   Pulse 93   Temp 97.9 F (36.6 C) (Oral)   Resp 18   Ht 5\' 9"  (1.753 m)   Wt 94.8 kg   SpO2 100%   BMI 30.86 kg/m   Physical Exam  Constitutional: He is oriented to person, place, and time. He appears well-developed and well-nourished. No distress.  HENT:  Head: Normocephalic and atraumatic.  Mouth/Throat: Oropharynx is clear and moist.  Neck: Normal range of motion. Neck supple.  Cardiovascular: Normal rate and regular rhythm. Exam reveals no friction rub.  No murmur heard. Pulmonary/Chest: Effort normal and breath sounds normal. No respiratory distress. He has no wheezes. He has no rales.  Abdominal: Soft. Bowel sounds are normal. He exhibits no distension. There is tenderness in the right lower quadrant. There is CVA tenderness. There is no rigidity, no rebound and no guarding.  Musculoskeletal: Normal range of motion. He exhibits no edema.  Neurological: He is alert and  oriented to person, place, and time. Coordination normal.  Skin: Skin is warm and dry. He is not diaphoretic.  Nursing note and vitals reviewed.    ED Treatments / Results  Labs (all labs ordered are listed, but only abnormal results are displayed) Labs Reviewed  URINALYSIS, ROUTINE W REFLEX MICROSCOPIC - Abnormal; Notable for the following components:      Result Value   pH 8.5 (*)    Ketones, ur >80 (*)    All other components within normal limits  CBC - Abnormal; Notable for the following components:   RBC 6.14 (*)     Hemoglobin 17.4 (*)    All other components within normal limits  LIPASE, BLOOD  COMPREHENSIVE METABOLIC PANEL    EKG None  Radiology No results found.  Procedures Procedures (including critical care time)  Medications Ordered in ED Medications  ketorolac (TORADOL) 30 MG/ML injection 30 mg (has no administration in time range)  morphine 4 MG/ML injection 4 mg (has no administration in time range)  ondansetron (ZOFRAN) injection 4 mg (4 mg Intravenous Given 11/13/17 2338)     Initial Impression / Assessment and Plan / ED Course  I have reviewed the triage vital signs and the nursing notes.  Pertinent labs & imaging results that were available during my care of the patient were reviewed by me and considered in my medical decision making (see chart for details).  Patient presents with right flank pain.  His presentation was suggestive of a renal calculus.  He underwent a renal CT which was negative for this, however did show what appears to be a mass at the GE junction with possible metastatic disease with endoscopy recommended.  He is feeling better after medications given in the ER and nothing appears emergent.  I have advised the patient of the significance of these findings and the importance of GI follow-up.  He has been given the number for Drs. Collene Mares and Graton to follow-up to discuss this endoscopy.  He is to call in the morning to make these arrangements.  Final Clinical Impressions(s) / ED Diagnoses   Final diagnoses:  None    ED Discharge Orders    None       Veryl Speak, MD 11/14/17 [id number]

## 2017-11-14 ENCOUNTER — Emergency Department (HOSPITAL_BASED_OUTPATIENT_CLINIC_OR_DEPARTMENT_OTHER): Payer: 59

## 2017-11-14 LAB — COMPREHENSIVE METABOLIC PANEL
ALBUMIN: 3.6 g/dL (ref 3.5–5.0)
ALK PHOS: 90 U/L (ref 38–126)
ALT: 45 U/L — AB (ref 0–44)
AST: 31 U/L (ref 15–41)
Anion gap: 12 (ref 5–15)
BILIRUBIN TOTAL: 0.9 mg/dL (ref 0.3–1.2)
BUN: 10 mg/dL (ref 6–20)
CALCIUM: 8.6 mg/dL — AB (ref 8.9–10.3)
CO2: 22 mmol/L (ref 22–32)
CREATININE: 0.9 mg/dL (ref 0.61–1.24)
Chloride: 103 mmol/L (ref 98–111)
GFR calc non Af Amer: >60 mL/min (ref >60)
GLUCOSE: 140 mg/dL — AB (ref 70–99)
Potassium: 3.7 mmol/L (ref 3.5–5.1)
SODIUM: 137 mmol/L (ref 135–145)
TOTAL PROTEIN: 7 g/dL (ref 6.5–8.1)

## 2017-11-14 LAB — LIPASE, BLOOD: Lipase: 33 U/L (ref 11–51)

## 2017-11-14 MED ORDER — MORPHINE SULFATE (PF) 4 MG/ML IV SOLN
4.0000 mg | Freq: Once | INTRAVENOUS | Status: AC
  Administered 2017-11-14: 4 mg via INTRAVENOUS

## 2017-11-14 MED ORDER — HYDROCODONE-ACETAMINOPHEN 5-325 MG PO TABS: 1.0000 | ORAL_TABLET | Freq: Four times a day (QID) | ORAL | 0 refills | Status: DC | PRN

## 2017-11-14 MED ORDER — MORPHINE SULFATE (PF) 4 MG/ML IV SOLN
INTRAVENOUS | Status: AC
  Filled 2017-11-14: qty 1

## 2017-11-14 MED ORDER — IOPAMIDOL (ISOVUE-300) INJECTION 61%
100.0000 mL | Freq: Once | INTRAVENOUS | Status: AC | PRN
  Administered 2017-11-14: 100 mL via INTRAVENOUS

## 2017-11-14 NOTE — Discharge Instructions (Addendum)
Follow-up with gastroenterology as soon as possible.  The contact information for Dr. Collene Mares has been provided in this discharge summary for you to call and make these arrangements.  Hydrocodone is prescribed as needed for pain.  Magnesium citrate: Drink the entire 10 ounce bottle mixed with equal parts Sprite or Gatorade for relief of constipation.

## 2017-11-14 NOTE — ED Notes (Signed)
Pt verbalizes understanding of dc instructions and denies any further needs at this time.  Dr. Stark Jock sent email to Dr. Collene Mares regarding pt's follow up with GI

## 2017-11-14 NOTE — ED Notes (Signed)
Patient transported to CT 

## 2017-11-21 ENCOUNTER — Other Ambulatory Visit: Payer: Self-pay

## 2017-11-21 ENCOUNTER — Inpatient Hospital Stay: Payer: 59 | Attending: Hematology & Oncology | Admitting: Hematology & Oncology

## 2017-11-21 ENCOUNTER — Encounter: Payer: Self-pay | Admitting: Hematology & Oncology

## 2017-11-21 ENCOUNTER — Inpatient Hospital Stay: Payer: 59

## 2017-11-21 VITALS — BP 133/85 | HR 110 | Temp 98.0°F | Resp 18 | Wt 199.0 lb

## 2017-11-21 DIAGNOSIS — C799 Secondary malignant neoplasm of unspecified site: Principal | ICD-10-CM

## 2017-11-21 DIAGNOSIS — C160 Malignant neoplasm of cardia: Secondary | ICD-10-CM

## 2017-11-21 DIAGNOSIS — R7989 Other specified abnormal findings of blood chemistry: Secondary | ICD-10-CM | POA: Insufficient documentation

## 2017-11-21 DIAGNOSIS — C787 Secondary malignant neoplasm of liver and intrahepatic bile duct: Secondary | ICD-10-CM | POA: Insufficient documentation

## 2017-11-21 DIAGNOSIS — C786 Secondary malignant neoplasm of retroperitoneum and peritoneum: Secondary | ICD-10-CM | POA: Insufficient documentation

## 2017-11-21 DIAGNOSIS — R970 Elevated carcinoembryonic antigen [CEA]: Secondary | ICD-10-CM | POA: Diagnosis not present

## 2017-11-21 DIAGNOSIS — K2270 Barrett's esophagus without dysplasia: Secondary | ICD-10-CM

## 2017-11-21 DIAGNOSIS — E611 Iron deficiency: Secondary | ICD-10-CM | POA: Insufficient documentation

## 2017-11-21 DIAGNOSIS — D649 Anemia, unspecified: Secondary | ICD-10-CM | POA: Insufficient documentation

## 2017-11-21 LAB — CMP (CANCER CENTER ONLY)
ALK PHOS: 111 U/L — AB (ref 26–84)
ALT: 61 U/L — AB (ref 10–47)
AST: 36 U/L (ref 11–38)
Albumin: 3.7 g/dL (ref 3.5–5.0)
Anion gap: 0 — ABNORMAL LOW (ref 5–15)
BUN: 9 mg/dL (ref 7–22)
CALCIUM: 9 mg/dL (ref 8.0–10.3)
CO2: 28 mmol/L (ref 18–33)
Chloride: 108 mmol/L (ref 98–108)
Creatinine: 1.3 mg/dL — ABNORMAL HIGH (ref 0.60–1.20)
GLUCOSE: 126 mg/dL — AB (ref 73–118)
Potassium: 3.9 mmol/L (ref 3.3–4.7)
Sodium: 135 mmol/L (ref 128–145)
TOTAL PROTEIN: 7.1 g/dL (ref 6.4–8.1)
Total Bilirubin: 0.5 mg/dL (ref 0.2–1.6)

## 2017-11-21 LAB — CBC WITH DIFFERENTIAL (CANCER CENTER ONLY)
BASOS ABS: 0 10*3/uL (ref 0.0–0.1)
Basophils Relative: 0 %
Eosinophils Absolute: 0 10*3/uL (ref 0.0–0.5)
Eosinophils Relative: 0 %
HEMATOCRIT: 48.3 % (ref 38.7–49.9)
HEMOGLOBIN: 16.4 g/dL (ref 13.0–17.1)
LYMPHS PCT: 16 %
Lymphs Abs: 1.1 10*3/uL (ref 0.9–3.3)
MCH: 28.6 pg (ref 28.0–33.4)
MCHC: 34 g/dL (ref 32.0–35.9)
MCV: 84.1 fL (ref 82.0–98.0)
Monocytes Absolute: 0.6 10*3/uL (ref 0.1–0.9)
Monocytes Relative: 9 %
NEUTROS ABS: 5.1 10*3/uL (ref 1.5–6.5)
NEUTROS PCT: 75 %
PLATELETS: 147 10*3/uL (ref 145–400)
RBC: 5.74 MIL/uL — AB (ref 4.20–5.70)
RDW: 12.7 % (ref 11.1–15.7)
WBC: 6.8 10*3/uL (ref 4.0–10.0)

## 2017-11-21 NOTE — Progress Notes (Signed)
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 New Patient (Initial Visit)  : I have cancer in my liver.  HPI: Mr. Matthew Reynolds is a really nice 52 year old white male.  I must say that he is in incredible shape.  He is working.  He has his own Immunologist.  He comes in with his wife and daughter.  He was having some issues with dyspepsia.  He was having a little bit of dysphasia.  He has family had gone down to [place] in August.  He was having some difficulty down there.  He said that food did not get stuck.  He has not lost weight.  He is still exercising.  He also was having some discomfort on the right side of his abdomen.  He thought this might have been kidney stones.    He initially went to the emergency room on November 14, 2017.  This was because of the right-sided pain.  A CT scan was done which showed multiple hepatic lesions.  They measured up to 12 mm.  There is no biliary dilatation.  There is no adenopathy.  There is no chest lesions in the lower lungs.  Also noted was thickening of the distal esophagus and gastroesophageal junction.  He was then referred to Dr. Collene Mares of gastroenterology.  She did a upper endoscopy on him on 11/19/2017.  She, and 40, found a large mass in the esophagus.  He had Barrett's esophagus.  The mass was noted from 39-43 cm.  It was nearly obstructing the esophageal lumen.  Biopsies were taken.  The pathology report ([id number]) showed a poorly differentiated invasive adenocarcinoma in the background of Barrett's esophagus.  Upon finding this, Dr. Collene Mares kindly referred Mr. Mukai to the [facility] for an evaluation.  He does not smoke.  He may have an occasional alcoholic beverage.  He really does not recall any problems with dyspepsia.  Para graph he has had some slight constipation.  He has had no urinary issues.  There is been  no leg swelling.  He has had no headache.  He has had no visual changes.  There is no family history of malignancy.  Overall, [name].   History reviewed. No pertinent past medical history.:  Past Surgical History:  Procedure Laterality Date  . CYST EXCISION Left 01/15/2016   Procedure: EXCISION SEBACEOUS CYST LEFT UPPER BACK;  Surgeon: Georganna Skeans, MD;  Location: [facility];  Service: General;  Laterality: Left;  :  No current outpatient medications on file.:  :  No Known Allergies:  History reviewed. No pertinent family history.:  Social History   Socioeconomic History  . Marital status: Married    Spouse name: Not on file  . Number of children: Not on file  . Years of education: Not on file  . Highest education level: Not on file  Occupational History  . Not on file  Social Needs  . Financial resource strain: Not on file  . Food insecurity:    Worry: Not on file    Inability: Not on file  . Transportation needs:    Medical: Not on file    Non-medical: Not on file  Tobacco Use  . Smoking status: Never Smoker  . Smokeless tobacco: Never Used  Substance and Sexual Activity  . Alcohol use: Yes    Comment: weekly  . Drug use:  No  . Sexual activity: Not on file  Lifestyle  . Physical activity:    Days per week: Not on file    Minutes per session: Not on file  . Stress: Not on file  Relationships  . Social connections:    Talks on phone: Not on file    Gets together: Not on file    Attends religious service: Not on file    Active member of club or organization: Not on file    Attends meetings of clubs or organizations: Not on file    Relationship status: Not on file  . Intimate partner violence:    Fear of current or ex partner: Not on file    Emotionally abused: Not on file    Physically abused: Not on file    Forced sexual activity: Not on file  Other Topics Concern  . Not on file  Social History Narrative  . Not on file   :  Review of Systems  Constitutional: Negative.   HENT: Negative.   Eyes: Negative.   Respiratory: Negative.   Cardiovascular: Negative.   Gastrointestinal: Positive for abdominal pain, constipation and heartburn.  Genitourinary: Negative.   Musculoskeletal: Negative.   Skin: Negative.   Neurological: Negative.   Endo/Heme/Allergies: Negative.   Psychiatric/Behavioral: Negative.      Exam: Well-developed well-nourished white male in no obvious distress.  Vital signs show a temperature of 98.  Pulse 110.  Blood pressure 133/85.  Weight is 199 pounds.  Head neck exam shows no ocular or oral lesions.  There are no palpable cervical or supraclavicular lymph nodes.  Thyroid is nonpalpable.  Lungs are clear bilaterally.  Cardiac exam tachycardic but regular.  There are no murmurs, rubs or bruits.  Abdomen is soft.  He has good bowel sounds.  There is no distention.  There is no fluid wave.  His liver edge is palpable at the right costal margin.  There is no palpable spleen tip.  There is no inguinal adenopathy bilaterally.  Back exam shows no tenderness over the spine, ribs or hips.  Extremities shows no clubbing, cyanosis or edema.  Neurological exam shows no focal neurological deficits.  Skin exam shows no rashes, ecchymoses or petechia. '@IPVITALS'$ @   Recent Labs    11/21/17 1448  WBC 6.8  HGB 16.4  HCT 48.3  PLT 147   Recent Labs    11/21/17 1448  NA 135  K 3.9  CL 108  CO2 28  GLUCOSE 126*  BUN 9  CREATININE 1.30*  CALCIUM 9.0    Blood smear review: None  Pathology: See above    Assessment and Plan: Mr. Abernathy is a very nice 52 year old white male.  It appears that he has a metastatic adenocarcinoma of the esophagus.  He underwent a biopsy which showed a poorly differentiated adenocarcinoma.  I would suspect that he probably does have liver metastasis.  He does have some slightly elevated LFTs.  I do think that we are going to have to get a liver biopsy so that we  can see what the genetic profile is of 1 of the metastatic lesions.  It would be helpful as we might be able to see if there is any actionable mutation that we can target.  I would get a PET scan on him so we can see the extent of his disease.  I would also consider an MRI of the brain.  The primary tumor in the esophagus is being sent off for HER-2 analysis.  He really looks quite good.  He is really not having a lot of problems swallowing.  I agree with trying to have soft foods or liquids.  Milkshakes would certainly be a great idea.  Given that he is in such great shape.  I think that we probably will consider him for a clinical trial.  He is young.  Again he is in fantastic shape.  I would really like to be aggressive and again I think a clinical trial would be the way to do this.  We will see what we have with the PET scan and MRI.  Again, I think the real "key" is the genetic profile of 1 of the metastatic liver lesions.  I spent a good hour with he and his family.  All the time spent face-to-face.  I counseled them.  I help coordinate all of his care and appointments.  I answered all their questions.  I did talk to him about the fact that this looks like it was stage IV disease.  Given that I think this is stage IV disease, that this is some that is not curable but is certainly treatable.  We will have the final stage known once we have our results back from the above studies.

## 2017-11-24 LAB — FERRITIN: Ferritin: 66 ng/mL (ref 24–336)

## 2017-11-24 LAB — LACTATE DEHYDROGENASE: LDH: 168 U/L (ref 98–192)

## 2017-11-24 LAB — IRON AND TIBC
Iron: 29 ug/dL — ABNORMAL LOW (ref 42–163)
Saturation Ratios: 10 % — ABNORMAL LOW (ref 42–163)
TIBC: 288 ug/dL (ref 202–409)
UIBC: 259 ug/dL

## 2017-11-24 LAB — CEA (IN HOUSE-CHCC): CEA (CHCC-IN HOUSE): 4.35 ng/mL (ref 0.00–5.00)

## 2017-12-01 ENCOUNTER — Ambulatory Visit (HOSPITAL_COMMUNITY)
Admission: RE | Admit: 2017-12-01 | Discharge: 2017-12-01 | Disposition: A | Discharge status: Home / Self Care | Payer: 59 | Source: Ambulatory Visit | Attending: Hematology & Oncology | Admitting: Hematology & Oncology

## 2017-12-01 DIAGNOSIS — C160 Malignant neoplasm of cardia: Secondary | ICD-10-CM

## 2017-12-01 DIAGNOSIS — C799 Secondary malignant neoplasm of unspecified site: Secondary | ICD-10-CM | POA: Insufficient documentation

## 2017-12-01 MED ORDER — GADOBUTROL 1 MMOL/ML IV SOLN
9.0000 mL | Freq: Once | INTRAVENOUS | Status: AC | PRN
  Administered 2017-12-01: 9 mL via INTRAVENOUS

## 2017-12-02 ENCOUNTER — Telehealth: Payer: Self-pay | Admitting: *Deleted

## 2017-12-02 NOTE — Telephone Encounter (Addendum)
-----   Message from [name], MD sent at 12/02/2017  5:51 AM EDT ----- Called patient for [name] to let him know that the MRI of the brain is (-) for any cancer.  Patient very relieved and thankful for this information

## 2017-12-03 ENCOUNTER — Other Ambulatory Visit: Payer: Self-pay | Admitting: Radiology

## 2017-12-03 ENCOUNTER — Other Ambulatory Visit: Payer: Self-pay | Admitting: Physician Assistant

## 2017-12-04 ENCOUNTER — Other Ambulatory Visit: Payer: Self-pay

## 2017-12-04 ENCOUNTER — Ambulatory Visit (HOSPITAL_COMMUNITY)
Admission: RE | Admit: 2017-12-04 | Discharge: 2017-12-04 | Disposition: A | Discharge status: Home / Self Care | Payer: 59 | Source: Ambulatory Visit | Attending: Family Medicine | Admitting: Family Medicine

## 2017-12-04 ENCOUNTER — Ambulatory Visit (HOSPITAL_COMMUNITY)
Admission: RE | Admit: 2017-12-04 | Discharge: 2017-12-04 | Disposition: A | Discharge status: Home / Self Care | Payer: 59 | Source: Ambulatory Visit | Attending: Hematology & Oncology | Admitting: Hematology & Oncology

## 2017-12-04 ENCOUNTER — Encounter (HOSPITAL_COMMUNITY): Payer: Self-pay

## 2017-12-04 DIAGNOSIS — C787 Secondary malignant neoplasm of liver and intrahepatic bile duct: Secondary | ICD-10-CM | POA: Insufficient documentation

## 2017-12-04 DIAGNOSIS — C160 Malignant neoplasm of cardia: Secondary | ICD-10-CM

## 2017-12-04 DIAGNOSIS — C799 Secondary malignant neoplasm of unspecified site: Secondary | ICD-10-CM

## 2017-12-04 DIAGNOSIS — C159 Malignant neoplasm of esophagus, unspecified: Secondary | ICD-10-CM | POA: Insufficient documentation

## 2017-12-04 LAB — CBC WITH DIFFERENTIAL/PLATELET
BASOS ABS: 0 10*3/uL (ref 0.0–0.1)
BASOS PCT: 0 %
Eosinophils Absolute: 0 10*3/uL (ref 0.0–0.7)
Eosinophils Relative: 1 %
HEMATOCRIT: 39.4 % (ref 39.0–52.0)
Hemoglobin: 13.5 g/dL (ref 13.0–17.0)
Lymphocytes Relative: 17 %
Lymphs Abs: 1.1 10*3/uL (ref 0.7–4.0)
MCH: 28.8 pg (ref 26.0–34.0)
MCHC: 34.3 g/dL (ref 30.0–36.0)
MCV: 84.2 fL (ref 78.0–100.0)
MONO ABS: 0.6 10*3/uL (ref 0.1–1.0)
Monocytes Relative: 9 %
NEUTROS ABS: 4.7 10*3/uL (ref 1.7–7.7)
Neutrophils Relative %: 73 %
PLATELETS: 281 10*3/uL (ref 150–400)
RBC: 4.68 MIL/uL (ref 4.22–5.81)
RDW: 12.9 % (ref 11.5–15.5)
WBC: 6.4 10*3/uL (ref 4.0–10.5)

## 2017-12-04 LAB — COMPREHENSIVE METABOLIC PANEL
ALBUMIN: 3.3 g/dL — AB (ref 3.5–5.0)
ALT: 126 U/L — ABNORMAL HIGH (ref 0–44)
ANION GAP: 10 (ref 5–15)
AST: 70 U/L — ABNORMAL HIGH (ref 15–41)
Alkaline Phosphatase: 158 U/L — ABNORMAL HIGH (ref 38–126)
BUN: 13 mg/dL (ref 6–20)
CHLORIDE: 103 mmol/L (ref 98–111)
CO2: 26 mmol/L (ref 22–32)
Calcium: 9 mg/dL (ref 8.9–10.3)
Creatinine, Ser: 0.85 mg/dL (ref 0.61–1.24)
GFR calc Af Amer: >60 mL/min (ref >60)
GFR calc non Af Amer: >60 mL/min (ref >60)
GLUCOSE: 112 mg/dL — AB (ref 70–99)
POTASSIUM: 4.1 mmol/L (ref 3.5–5.1)
SODIUM: 139 mmol/L (ref 135–145)
TOTAL PROTEIN: 6.6 g/dL (ref 6.5–8.1)
Total Bilirubin: 0.7 mg/dL (ref 0.3–1.2)

## 2017-12-04 LAB — PROTIME-INR
INR: 0.99
Prothrombin Time: 13 seconds (ref 11.4–15.2)

## 2017-12-04 MED ORDER — SODIUM CHLORIDE 0.9 % IV SOLN
INTRAVENOUS | Status: DC
  Administered 2017-12-04: 11:00:00 via INTRAVENOUS

## 2017-12-04 MED ORDER — FENTANYL CITRATE (PF) 100 MCG/2ML IJ SOLN
INTRAMUSCULAR | Status: AC | PRN
  Administered 2017-12-04 (×2): 50 ug via INTRAVENOUS

## 2017-12-04 MED ORDER — LIDOCAINE HCL (PF) 1 % IJ SOLN
INTRAMUSCULAR | Status: AC | PRN
  Administered 2017-12-04: 10 mL

## 2017-12-04 MED ORDER — LIDOCAINE HCL 1 % IJ SOLN
INTRAMUSCULAR | Status: AC
  Filled 2017-12-04: qty 10

## 2017-12-04 MED ORDER — MIDAZOLAM HCL 2 MG/2ML IJ SOLN
INTRAMUSCULAR | Status: AC | PRN
  Administered 2017-12-04: 1 mg via INTRAVENOUS
  Administered 2017-12-04: 2 mg via INTRAVENOUS

## 2017-12-04 MED ORDER — MIDAZOLAM HCL 2 MG/2ML IJ SOLN
INTRAMUSCULAR | Status: AC
  Filled 2017-12-04: qty 2

## 2017-12-04 MED ORDER — FENTANYL CITRATE (PF) 100 MCG/2ML IJ SOLN
INTRAMUSCULAR | Status: AC
  Filled 2017-12-04: qty 2

## 2017-12-04 MED ORDER — GELATIN ABSORBABLE 12-7 MM EX MISC
CUTANEOUS | Status: AC
  Filled 2017-12-04: qty 1

## 2017-12-04 NOTE — H&P (Signed)
Chief Complaint: Patient was seen in consultation today for liver lesion.  Referring Physician(s): Ennever,Peter R  Supervising Physician: Corrie Mckusick  Patient Status: [facility] - Out-pt  History of Present Illness: Matthew Reynolds is a 52 y.o. male with a past medical history of esophageal cancer. He was unfortunately diagnosed with esophageal cancer earlier this month. During his oncology workup, he underwent a CT abdomen/pelvis which revealed multiple hepatic hypodense lesions.  IR requested by [name] for possible image-guided liver lesion biopsy. Patient awake and alert laying in bed. Complains of intermittent abdominal pain over the past few weeks, stable at this time. Denies fever, chills, chest pain, dyspnea, dizziness, or headache.   No past medical history on file.  Past Surgical History:  Procedure Laterality Date  . CYST EXCISION Left 01/15/2016   Procedure: EXCISION SEBACEOUS CYST LEFT UPPER BACK;  Surgeon: Georganna Skeans, MD;  Location: [place];  Service: General;  Laterality: Left;    Allergies: Patient has no known allergies.  Medications: Prior to Admission medications   Not on File     No family history on file.  Social History   Socioeconomic History  . Marital status: Married    Spouse name: Not on file  . Number of children: Not on file  . Years of education: Not on file  . Highest education level: Not on file  Occupational History  . Not on file  Social Needs  . Financial resource strain: Not on file  . Food insecurity:    Worry: Not on file    Inability: Not on file  . Transportation needs:    Medical: Not on file    Non-medical: Not on file  Tobacco Use  . Smoking status: Never Smoker  . Smokeless tobacco: Never Used  Substance and Sexual Activity  . Alcohol use: Yes    Comment: weekly  . Drug use: No  . Sexual activity: Not on file  Lifestyle  . Physical activity:    Days per week: Not on file    Minutes per session: Not on  file  . Stress: Not on file  Relationships  . Social connections:    Talks on phone: Not on file    Gets together: Not on file    Attends religious service: Not on file    Active member of club or organization: Not on file    Attends meetings of clubs or organizations: Not on file    Relationship status: Not on file  Other Topics Concern  . Not on file  Social History Narrative  . Not on file     Review of Systems: A 12 point ROS discussed and pertinent positives are indicated in the HPI above.  All other systems are negative.  Review of Systems  Constitutional: Negative for chills and fever.  Respiratory: Negative for shortness of breath and wheezing.   Cardiovascular: Negative for chest pain and palpitations.  Gastrointestinal: Positive for abdominal pain.  Neurological: Negative for dizziness and headaches.  Psychiatric/Behavioral: Negative for behavioral problems and confusion.    Vital Signs: There were no vitals taken for this visit.  Physical Exam  Constitutional: He is oriented to person, place, and time. He appears well-developed and well-nourished. No distress.  Cardiovascular: Normal rate, regular rhythm and normal heart sounds.  No murmur heard. Pulmonary/Chest: Effort normal and breath sounds normal. No respiratory distress. He has no wheezes.  Neurological: He is alert and oriented to person, place, and time.  Skin: Skin is warm  and dry.  Psychiatric: He has a normal mood and affect. His behavior is normal. Judgment and thought content normal.  Nursing note and vitals reviewed.    MD Evaluation Airway: WNL Heart: WNL Abdomen: WNL Chest/ Lungs: WNL ASA  Classification: 3 Mallampati/Airway Score: One   Imaging: Mr Jeri Cos XQ Contrast  Result Date: 12/01/2017 CLINICAL DATA:  52 y/o  M; esophageal cancer, initial workup. EXAM: MRI HEAD WITHOUT AND WITH CONTRAST TECHNIQUE: Multiplanar, multiecho pulse sequences of the brain and surrounding structures  were obtained without and with intravenous contrast. CONTRAST:  10 cc MultiHance. COMPARISON:  None. FINDINGS: Brain: No acute infarction, hemorrhage, hydrocephalus, extra-axial collection or mass lesion. Few punctate foci of T2 FLAIR hyperintense signal abnormality in bifrontal subcortical white matter of unlikely clinical significance given age. After administration of intravenous contrast there is no abnormal enhancement of the brain. Vascular: Normal flow voids. Skull and upper cervical spine: Normal marrow signal. Sinuses/Orbits: Negative. Other: None. IMPRESSION: No intracranial metastatic disease identified. Unremarkable MRI of the brain for age. Electronically Signed   By: Kristine Garbe M.D.   On: 12/01/2017 21:42   Ct Abdomen Pelvis W Contrast  Result Date: 11/14/2017 CLINICAL DATA:  52 year old male with right lower quadrant/right flank pain. EXAM: CT ABDOMEN AND PELVIS WITH CONTRAST TECHNIQUE: Multidetector CT imaging of the abdomen and pelvis was performed using the standard protocol following bolus administration of intravenous contrast. CONTRAST:  12mL ISOVUE-300 IOPAMIDOL (ISOVUE-300) INJECTION 61% COMPARISON:  CT of the abdomen pelvis dated 11/14/2017 FINDINGS: Lower chest: Minimal bibasilar dependent atelectatic changes. The visualized lung bases are otherwise clear. No intra-abdominal free air.  Small free fluid within the pelvis. Hepatobiliary: There are multiple hepatic hypodense lesions measure up to 12 mm in the dome of the liver, indeterminate but concerning for metastatic disease. Further characterization with MRI is recommended. There is no intrahepatic biliary ductal dilatation. The gallbladder is unremarkable. Pancreas: Unremarkable. No pancreatic ductal dilatation or surrounding inflammatory changes. Spleen: Normal in size without focal abnormality. Adrenals/Urinary Tract: Adrenal glands are unremarkable. Kidneys are normal, without renal calculi, focal lesion, or  hydronephrosis. Bladder is unremarkable. Stomach/Bowel: There is thickening of the distal esophagus which although may be related to varices or represent esophagitis, is concerning for a neoplastic process. Further evaluation with endoscopy is recommended. There is infiltration of the fat surrounding the gastroesophageal junction and along the gastrohepatic ligament concerning for tumoral infiltration. There is no bowel obstruction. The appendix is normal. Vascular/Lymphatic: The abdominal aorta and IVC appear unremarkable. The origins of the celiac axis, SMA, IMA and the renal arteries are patent. The SMV, splenic vein, and main portal vein are patent. No portal venous gas. Several small but somewhat rounded retroperitoneal lymph nodes for example at the cavoatrial junction on series 2, image 43 measuring 9 mm. Reproductive: The prostate and seminal vesicles are grossly unremarkable. Other: There is scattered areas of omental nodularity primarily in the right anterior abdomen (series 2, image 45) as well as additional scattered nodularity in the left abdomen concerning for implants. Musculoskeletal: Bilateral L5 pars defects with grade 1 L5-S1 anterolisthesis. No acute osseous pathology. IMPRESSION: 1. Thickened and irregular distal esophagus and gastroesophageal junction concerning for neoplasm. Further evaluation with endoscopy is recommended. 2. Infiltration of the fat surrounding the GE junction and along the gastrohepatic ligament suspicious for tumoral infiltration. 3. Multiple hepatic hypodense lesions are not characterized but concerning for metastatic disease. Further evaluation with MRI is recommended. 4. Omental nodularity represent implants. Electronically Signed   By: Milas Hock  Radparvar M.D.   On: 11/14/2017 02:01   Ct Renal Stone Study  Result Date: 11/14/2017 CLINICAL DATA:  Right flank pain EXAM: CT ABDOMEN AND PELVIS WITHOUT CONTRAST TECHNIQUE: Multidetector CT imaging of the abdomen and pelvis  was performed following the standard protocol without IV contrast. COMPARISON:  None. FINDINGS: Lower chest: Scarring at the left base. No effusions. There small hiatal hernia. Hepatobiliary: Small hypodensity in the right hepatic dome which cannot be characterized on this unenhanced study but likely small cysts. No biliary duct dilatation. Gallbladder unremarkable. Pancreas: No focal abnormality or ductal dilatation. Spleen: No focal abnormality.  Normal size. Adrenals/Urinary Tract: Bilateral perinephric stranding. No renal or ureteral stones. No hydronephrosis. Adrenal glands and urinary bladder unremarkable. Stomach/Bowel: Normal appendix Stomach, large and small bowel grossly unremarkable. Vascular/Lymphatic: No evidence of aneurysm or adenopathy. Reproductive: No visible focal abnormality. Other: Small amount of free fluid in the pelvis. There appear to be prominent blood vessels, possibly varices in the gastrohepatic ligament region of the upper abdomen with stranding noted in the gastrohepatic ligament. This is of unknown etiology. This could conceivably be related to vessel occlusion. This would be better evaluated with contrast-enhanced CT. Musculoskeletal: No acute bony abnormality. Bilateral L5 pars defects with grade 1 anterolisthesis of L5 on S1. IMPRESSION: Prominent, tangled vessels noted in the gastrohepatic ligament with stranding noted. These may reflect varices although there are no changes of cirrhosis to suggest the cause of the varices. These could be related to vessel occlusion. Recommend further evaluation with contrast-enhanced CT of the abdomen. No renal or ureteral stones. Small amount of free fluid in the pelvis. Electronically Signed   By: Rolm Baptise M.D.   On: 11/14/2017 00:56    Labs:  CBC: Recent Labs    11/13/17 2332 11/21/17 1448  WBC 10.0 6.8  HGB 17.4* 16.4  HCT 49.2 48.3  PLT 230 147    COAGS: No results for input(s): INR, APTT in the last 8760  hours.  BMP: Recent Labs    11/14/17 0010 11/21/17 1448  NA 137 135  K 3.7 3.9  CL 103 108  CO2 22 28  GLUCOSE 140* 126*  BUN 10 9  CALCIUM 8.6* 9.0  CREATININE 0.90 1.30*  GFRNONAA >60  --   GFRAA >60  --     LIVER FUNCTION TESTS: Recent Labs    11/14/17 0010 11/21/17 1448  BILITOT 0.9 0.5  AST 31 36  ALT 45* 61*  ALKPHOS 90 111*  PROT 7.0 7.1  ALBUMIN 3.6 3.7    TUMOR MARKERS: No results for input(s): AFPTM, CEA, CA199, CHROMGRNA in the last 8760 hours.  Assessment and Plan:  Liver lesion. Plan for image-guided liver lesion biopsy today with Dr. Earleen Newport. Patient is NPO. Denies fever. He does not take blood thinners. INR pending.  Risks and benefits discussed with the patient including, but not limited to bleeding, infection, damage to adjacent structures or low yield requiring additional tests. All of the patient's questions were answered, patient is agreeable to proceed. Consent signed and in chart.   Thank you for this interesting consult.  I greatly enjoyed meeting Matthew Reynolds and look forward to participating in their care.  A copy of this report was sent to the requesting provider on this date.  Electronically Signed: Earley Abide, PA-C 12/04/2017, 11:05 AM   I spent a total of 15 Minutes in face to face in clinical consultation, greater than 50% of which was counseling/coordinating care for liver lesion.

## 2017-12-04 NOTE — Procedures (Signed)
Interventional Radiology Procedure Note  Procedure: US guided liver lesion bx.  mx 16O core  Complications: None Recommendations:  - Ok to shower tomorrow - Do not submerge for 7 days - Routine care - 2 hour dc home - advance diet   Signed,  Dulcy Fanny. Earleen Newport, DO

## 2017-12-04 NOTE — Discharge Instructions (Signed)
Moderate Conscious Sedation, Adult, Care After °These instructions provide you with information about caring for yourself after your procedure. Your health care provider may also give you more specific instructions. Your treatment has been planned according to current medical practices, but problems sometimes occur. Call your health care provider if you have any problems or questions after your procedure. °What can I expect after the procedure? °After your procedure, it is common: °· To feel sleepy for several hours. °· To feel clumsy and have poor balance for several hours. °· To have poor judgment for several hours. °· To vomit if you eat too soon. ° °Follow these instructions at home: °For at least 24 hours after the procedure: ° °· Do not: °? Participate in activities where you could fall or become injured. °? Drive. °? Use heavy machinery. °? Drink alcohol. °? Take sleeping pills or medicines that cause drowsiness. °? Make important decisions or sign legal documents. °? Take care of children on your own. °· Rest. °Eating and drinking °· Follow the diet recommended by your health care provider. °· If you vomit: °? Drink water, juice, or soup when you can drink without vomiting. °? Make sure you have little or no nausea before eating solid foods. °General instructions °· Have a responsible adult stay with you until you are awake and alert. °· Take over-the-counter and prescription medicines only as told by your health care provider. °· If you smoke, do not smoke without supervision. °· Keep all follow-up visits as told by your health care provider. This is important. °Contact a health care provider if: °· You keep feeling nauseous or you keep vomiting. °· You feel light-headed. °· You develop a rash. °· You have a fever. °Get help right away if: °· You have trouble breathing. °This information is not intended to replace advice given to you by your health care provider. Make sure you discuss any questions you have  with your health care provider. °Document Released: 12/23/2012 Document Revised: 08/07/2015 Document Reviewed: 06/24/2015 °Elsevier Interactive Patient Education © 2018 [employer] ° ° °Liver Biopsy, Care After °These instructions give you information on caring for yourself after your procedure. Your doctor may also give you more specific instructions. Call your doctor if you have any problems or questions after your procedure. °Follow these instructions at home: °· Rest at home for 1-2 days or as told by your doctor. °· Have someone stay with you for at least 24 hours. °· Do not do these things in the first 24 hours: °? Drive. °? Use machinery. °? Take care of other people. °? Sign legal documents. °? Take a bath or shower. °· There are many different ways to close and cover a cut (incision). For example, a cut can be closed with stitches, skin glue, or adhesive strips. Follow your doctor's instructions on: °? Taking care of your cut. °? Changing and removing your bandage (dressing). °? Removing whatever was used to close your cut. °· Do not drink alcohol in the first week. °· Do not lift more than 5 pounds or play contact sports for the first 2 weeks. °· Take medicines only as told by your doctor. For 1 week, do not take medicine that has aspirin in it or medicines like ibuprofen. °· Get your test results. °Contact a doctor if: °· A cut bleeds and leaves more than just a small spot of blood. °· A cut is red, puffs up (swells), or hurts more than before. °· Fluid or something else   comes from a cut. °· A cut smells bad. °· You have a fever or chills. °Get help right away if: °· You have swelling, bloating, or pain in your belly (abdomen). °· You get dizzy or faint. °· You have a rash. °· You feel sick to your stomach (nauseous) or throw up (vomit). °· You have trouble breathing, feel short of breath, or feel faint. °· Your chest hurts. °· You have problems talking or seeing. °· You have trouble balancing or moving  your arms or legs. °This information is not intended to replace advice given to you by your health care provider. Make sure you discuss any questions you have with your health care provider. °Document Released: 12/12/2007 Document Revised: 08/10/2015 Document Reviewed: 04/30/2013 °Elsevier Interactive Patient Education © 2018 [employer] Inc[employer] ° ° °

## 2017-12-08 ENCOUNTER — Encounter (HOSPITAL_COMMUNITY)
Admission: RE | Admit: 2017-12-08 | Discharge: 2017-12-08 | Disposition: A | Discharge status: Home / Self Care | Payer: 59 | Source: Ambulatory Visit | Attending: Hematology & Oncology | Admitting: Hematology & Oncology

## 2017-12-08 DIAGNOSIS — C799 Secondary malignant neoplasm of unspecified site: Secondary | ICD-10-CM | POA: Diagnosis not present

## 2017-12-08 DIAGNOSIS — C160 Malignant neoplasm of cardia: Secondary | ICD-10-CM | POA: Insufficient documentation

## 2017-12-08 LAB — GLUCOSE, CAPILLARY: GLUCOSE-CAPILLARY: 115 mg/dL — AB (ref 70–99)

## 2017-12-08 MED ORDER — FLUDEOXYGLUCOSE F - 18 (FDG) INJECTION
9.7000 | Freq: Once | INTRAVENOUS | Status: AC | PRN
  Administered 2017-12-08: 9.7 via INTRAVENOUS

## 2017-12-10 ENCOUNTER — Other Ambulatory Visit: Payer: Self-pay | Admitting: Hematology & Oncology

## 2017-12-10 ENCOUNTER — Encounter: Payer: Self-pay | Admitting: Hematology & Oncology

## 2017-12-10 DIAGNOSIS — C160 Malignant neoplasm of cardia: Secondary | ICD-10-CM | POA: Insufficient documentation

## 2017-12-10 DIAGNOSIS — Z7189 Other specified counseling: Secondary | ICD-10-CM

## 2017-12-10 DIAGNOSIS — C799 Secondary malignant neoplasm of unspecified site: Principal | ICD-10-CM

## 2017-12-10 DIAGNOSIS — C787 Secondary malignant neoplasm of liver and intrahepatic bile duct: Secondary | ICD-10-CM

## 2017-12-10 HISTORY — DX: Secondary malignant neoplasm of liver and intrahepatic bile duct: C78.7

## 2017-12-10 HISTORY — DX: Other specified counseling: Z71.89

## 2017-12-10 HISTORY — DX: Malignant neoplasm of cardia: C79.9

## 2017-12-10 HISTORY — DX: Malignant neoplasm of cardia: C16.0

## 2017-12-11 ENCOUNTER — Encounter: Payer: Self-pay | Admitting: Hematology & Oncology

## 2017-12-11 ENCOUNTER — Inpatient Hospital Stay (HOSPITAL_BASED_OUTPATIENT_CLINIC_OR_DEPARTMENT_OTHER): Payer: 59 | Admitting: Hematology & Oncology

## 2017-12-11 ENCOUNTER — Other Ambulatory Visit: Payer: Self-pay | Admitting: *Deleted

## 2017-12-11 ENCOUNTER — Other Ambulatory Visit: Payer: Self-pay

## 2017-12-11 ENCOUNTER — Inpatient Hospital Stay: Payer: 59

## 2017-12-11 VITALS — BP 122/67 | HR 113 | Temp 98.9°F | Resp 18 | Wt 195.0 lb

## 2017-12-11 DIAGNOSIS — Z5111 Encounter for antineoplastic chemotherapy: Secondary | ICD-10-CM

## 2017-12-11 DIAGNOSIS — C786 Secondary malignant neoplasm of retroperitoneum and peritoneum: Secondary | ICD-10-CM

## 2017-12-11 DIAGNOSIS — C160 Malignant neoplasm of cardia: Secondary | ICD-10-CM | POA: Diagnosis not present

## 2017-12-11 DIAGNOSIS — C787 Secondary malignant neoplasm of liver and intrahepatic bile duct: Secondary | ICD-10-CM

## 2017-12-11 DIAGNOSIS — R970 Elevated carcinoembryonic antigen [CEA]: Secondary | ICD-10-CM

## 2017-12-11 DIAGNOSIS — D500 Iron deficiency anemia secondary to blood loss (chronic): Secondary | ICD-10-CM

## 2017-12-11 DIAGNOSIS — E611 Iron deficiency: Secondary | ICD-10-CM

## 2017-12-11 DIAGNOSIS — D649 Anemia, unspecified: Secondary | ICD-10-CM | POA: Diagnosis not present

## 2017-12-11 DIAGNOSIS — C799 Secondary malignant neoplasm of unspecified site: Principal | ICD-10-CM

## 2017-12-11 HISTORY — DX: Iron deficiency anemia secondary to blood loss (chronic): D50.0

## 2017-12-11 LAB — CBC WITH DIFFERENTIAL (CANCER CENTER ONLY)
Basophils Absolute: 0 10*3/uL (ref 0.0–0.1)
Basophils Relative: 0 %
Eosinophils Absolute: 0 10*3/uL (ref 0.0–0.5)
Eosinophils Relative: 0 %
HCT: 24.5 % — ABNORMAL LOW (ref 38.7–49.9)
HEMOGLOBIN: 8 g/dL — AB (ref 13.0–17.1)
LYMPHS ABS: 1.1 10*3/uL (ref 0.9–3.3)
LYMPHS PCT: 12 %
MCH: 28 pg (ref 28.0–33.4)
MCHC: 32.7 g/dL (ref 32.0–35.9)
MCV: 85.7 fL (ref 82.0–98.0)
Monocytes Absolute: 0.7 10*3/uL (ref 0.1–0.9)
Monocytes Relative: 8 %
Neutro Abs: 7.1 10*3/uL — ABNORMAL HIGH (ref 1.5–6.5)
Neutrophils Relative %: 80 %
Platelet Count: 321 10*3/uL (ref 145–400)
RBC: 2.86 MIL/uL — AB (ref 4.20–5.70)
RDW: 12.3 % (ref 11.1–15.7)
WBC Count: 9 10*3/uL (ref 4.0–10.0)

## 2017-12-11 LAB — PREPARE RBC (CROSSMATCH)

## 2017-12-11 LAB — CMP (CANCER CENTER ONLY)
ALK PHOS: 176 U/L — AB (ref 26–84)
ALT: 93 U/L — AB (ref 10–47)
AST: 44 U/L — AB (ref 11–38)
Albumin: 3 g/dL — ABNORMAL LOW (ref 3.5–5.0)
Anion gap: 0 — ABNORMAL LOW (ref 5–15)
BUN: 15 mg/dL (ref 7–22)
CALCIUM: 8.5 mg/dL (ref 8.0–10.3)
CO2: 29 mmol/L (ref 18–33)
CREATININE: 0.9 mg/dL (ref 0.60–1.20)
Chloride: 106 mmol/L (ref 98–108)
Glucose, Bld: 112 mg/dL (ref 73–118)
POTASSIUM: 4.4 mmol/L (ref 3.3–4.7)
Sodium: 132 mmol/L (ref 128–145)
TOTAL PROTEIN: 6.1 g/dL — AB (ref 6.4–8.1)
Total Bilirubin: 0.5 mg/dL (ref 0.2–1.6)

## 2017-12-11 LAB — SAMPLE TO BLOOD BANK

## 2017-12-11 LAB — LACTATE DEHYDROGENASE: LDH: 145 U/L (ref 98–192)

## 2017-12-11 NOTE — Progress Notes (Signed)
START ON PATHWAY REGIMEN - Gastroesophageal     Cycles 1 through 8: every 14 days:     Oxaliplatin      Leucovorin      5-Fluorouracil      5-Fluorouracil      Trastuzumab-xxxx      Trastuzumab-xxxx    Cycles 9 and beyond: every 21 days :     Trastuzumab-xxxx   **Always confirm dose/schedule in your pharmacy ordering system**  Patient Characteristics: Distant Metastases (cM1/pM1) / Locally Recurrent Disease, Adenocarcinoma - Esophageal, GE Junction, and Gastric, First Line, HER2 Positive Histology: Adenocarcinoma Disease Classification: GE Junction Therapeutic Status: Distant Metastases (No Additional Staging) Line of Therapy: First Line HER2 Status: Positive Intent of Therapy: Non-Curative / Palliative Intent, Discussed with Patient

## 2017-12-11 NOTE — Progress Notes (Signed)
Hematology and Oncology Follow Up Visit  Matthew Reynolds [id number] 21-Apr-1965 52 y.o. 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 Frady is back for follow-up.  We have moved along pretty quickly with his work-up.  I did get a MRI of the brain.  This was negative for any brain metastasis.  I did get a PET scan on him.  Not surprisingly, the PET scan showed the primary at the GE junction.  This had SUV of 17.  He had multiple hepatic tumors.  There was mediastinal nodal involvement.  He had left supraclavicular nodal involvement.  He had upper abdominal nodal involvement.  Also noted was a left pelvic metastasis in the peritoneum.  There is also a metastasis at T7.  We did get some additional studies on the primary biopsy.  We found that the tumor is HER-2 positive.    I did do a liver biopsy.  I really would like to have molecular analysis of a metastatic focus.  This was done on 12/04/2017.  The pathology report ([id number]) showed adenocarcinoma consistent with esophageal/gastroesophageal primary.  The genetic profiling is pending.  He says his stools are black.  I am sure that he is bleeding from the primary tumor.  He is still able to swallow.  However he is able to swallow liquids and soft food.  His hemoglobin today was 8.  As such, we are going to have to get started on treatment quickly.  He is going to need to be transfused with 2 units of blood.  We will set him up for a blood transfusion tomorrow.  He is still in great shape.  He looks like he can run a marathon.  As [name], I feel that we can be very aggressive with his therapy.  I would favor systemic chemotherapy with FOLFOX/Herceptin.  I think this would be considered standard of care for his kind of tumor.  He will need to have a Port-A-Cath  to be placed.  I will also have to get an echocardiogram on him.  He is not hurting.  He has had no cough or shortness of breath.  He has had no nausea or vomiting.  There is been no leg swelling.  Again, [name].  Medications:  Current Outpatient Medications:  .  bisacodyl (DULCOLAX) 5 MG EC tablet, Take 5 mg by mouth daily as needed for moderate constipation., Disp: , Rfl:  .  diphenhydramine-acetaminophen (TYLENOL PM) 25-500 MG TABS tablet, Take 1 tablet by mouth at bedtime as needed., Disp: , Rfl:  .  ranitidine (ZANTAC) 150 MG tablet, Take 250 mg by mouth 2 (two) times daily., Disp: , Rfl:  .  rosuvastatin (CRESTOR) 5 MG tablet, Take 5 mg by mouth daily., Disp: , Rfl:   Allergies: No Known Allergies  Past Medical History, Surgical history, Social history, and Family History were reviewed and updated.  Review of Systems: Review of Systems  Constitutional: Negative.   HENT:  Negative.   Eyes: Negative.   Respiratory: Negative.   Cardiovascular: Negative.   Gastrointestinal: Positive for blood in stool and constipation.  Endocrine: Negative.   Genitourinary: Negative.    Musculoskeletal: Negative.   Neurological: Negative.   Hematological: Negative.   Psychiatric/Behavioral: Negative.     Physical Exam:  weight is 195 lb (  88.5 kg). His oral temperature is 98.9 F (37.2 C). His blood pressure is 122/67 and his pulse is 113 (abnormal). His respiration is 18 and oxygen saturation is 100%.   Wt Readings from Last 3 Encounters:  12/11/17 195 lb (88.5 kg)  12/08/17 195 lb (88.5 kg)  11/21/17 199 lb (90.3 kg)    Physical Exam  Constitutional: He is oriented to person, place, and time.  Well-developed and well-nourished white male.  There is no adenopathy in the neck.  His lungs are clear.  Oral exam shows no mucositis.  Extremities shows no clubbing, cyanosis or edema.  HENT:  Head: Normocephalic and atraumatic.  Mouth/Throat: Oropharynx is clear  and moist.  Eyes: Pupils are equal, round, and reactive to light. EOM are normal.  Neck: Normal range of motion.  Cardiovascular: Normal rate, regular rhythm and normal heart sounds.  Pulmonary/Chest: Effort normal and breath sounds normal.  Abdominal: Soft. Bowel sounds are normal.  Abdomen is soft.  He has decent bowel sounds.  There is no fluid wave.  There is no obvious hepato-splenomegaly.  Musculoskeletal: Normal range of motion. He exhibits no edema, tenderness or deformity.  Lymphadenopathy:    He has no cervical adenopathy.  Neurological: He is alert and oriented to person, place, and time.  Skin: Skin is warm and dry. No rash noted. No erythema.  Psychiatric: He has a normal mood and affect. His behavior is normal. Judgment and thought content normal.  Vitals reviewed.    Lab Results  Component Value Date   WBC 9.0 12/11/2017   HGB 8.0 (L) 12/11/2017   HCT 24.5 (L) 12/11/2017   MCV 85.7 12/11/2017   PLT 321 12/11/2017     Chemistry      Component Value Date/Time   NA 132 12/11/2017 1339   K 4.4 12/11/2017 1339   CL 106 12/11/2017 1339   CO2 29 12/11/2017 1339   BUN 15 12/11/2017 1339   CREATININE 0.90 12/11/2017 1339      Component Value Date/Time   CALCIUM 8.5 12/11/2017 1339   ALKPHOS 176 (H) 12/11/2017 1339   AST 44 (H) 12/11/2017 1339   ALT 93 (H) 12/11/2017 1339   BILITOT 0.5 12/11/2017 1339       Impression and Plan: Mr. Seeberger is a 52 year old white male.  He has metastatic adenocarcinoma of the GE junction.  I would clearly treat this as a GE junction primary.  Thankfully, his tumor is HER-2 positive.  I believe that the FOLFOX/Herceptin protocol would be very appropriate for him.  I think that the chance of response is over 75-80%.  I forgot to mention that he does not have an elevated CEA level.  As such, we cannot utilize this to test for response.  I think that we will be able to look at his liver function studies and his hemoglobin as a  measure of response.  We clearly need to transfuse him.  I will set up 2 units of blood for him tomorrow.  His iron studies are also low.  I will give him a dose of IV iron.  I talked to he and his wife for about an hour.  I explained the chemotherapy for them.  I went over the side effects of treatment.  I gave him information sheets about each agent that we will use.  He understands all this.  He wishes to be aggressive.  We will move ahead with treatment as soon as we can get the Port-A-Cath in.  I do not think it is mandatory that we have a echocardiogram before the Herceptin.  He has no clinical issue with respect to cardiac disease.  I would plan for 4 cycles of FOLFOX/Herceptin and then we will really evaluate him with our PET scan.  I reviewed the PET scan with he and his wife.  I spent 80% of the time with he and his wife face-to-face.  I counseled them.  I coordinated all of his care and his transfusions.  I plan to start his treatments on October 2.  We will have him come back to see [place] for cycle #2 of treatment on October 16.   [name], MD 9/26/20195:23 PM

## 2017-12-12 ENCOUNTER — Other Ambulatory Visit: Payer: Self-pay | Admitting: Radiology

## 2017-12-12 ENCOUNTER — Inpatient Hospital Stay: Payer: 59

## 2017-12-12 ENCOUNTER — Encounter: Payer: Self-pay | Admitting: Hematology & Oncology

## 2017-12-12 VITALS — BP 115/68 | HR 84 | Temp 97.9°F | Resp 16

## 2017-12-12 DIAGNOSIS — C787 Secondary malignant neoplasm of liver and intrahepatic bile duct: Secondary | ICD-10-CM | POA: Diagnosis not present

## 2017-12-12 DIAGNOSIS — D500 Iron deficiency anemia secondary to blood loss (chronic): Secondary | ICD-10-CM

## 2017-12-12 DIAGNOSIS — C799 Secondary malignant neoplasm of unspecified site: Secondary | ICD-10-CM

## 2017-12-12 DIAGNOSIS — C160 Malignant neoplasm of cardia: Secondary | ICD-10-CM

## 2017-12-12 LAB — IRON AND TIBC
IRON: 13 ug/dL — AB (ref 42–163)
Saturation Ratios: 4 % — ABNORMAL LOW (ref 42–163)
TIBC: 294 ug/dL (ref 202–409)
UIBC: 281 ug/dL

## 2017-12-12 LAB — CEA (IN HOUSE-CHCC): CEA (CHCC-In House): 7.51 ng/mL — ABNORMAL HIGH (ref 0.00–5.00)

## 2017-12-12 LAB — ABO/RH: ABO/RH(D): O POS

## 2017-12-12 LAB — FERRITIN: Ferritin: 12 ng/mL — ABNORMAL LOW (ref 24–336)

## 2017-12-12 MED ORDER — ACETAMINOPHEN 325 MG PO TABS
650.0000 mg | ORAL_TABLET | Freq: Once | ORAL | Status: AC
  Administered 2017-12-12: 650 mg via ORAL

## 2017-12-12 MED ORDER — FUROSEMIDE 10 MG/ML IJ SOLN: 20.0000 mg | Freq: Once | INTRAMUSCULAR | Status: DC

## 2017-12-12 MED ORDER — DIPHENHYDRAMINE HCL 25 MG PO CAPS
25.0000 mg | ORAL_CAPSULE | Freq: Once | ORAL | Status: AC
  Administered 2017-12-12: 25 mg via ORAL

## 2017-12-12 MED ORDER — FAMOTIDINE 40 MG PO TABS: 40.0000 mg | ORAL_TABLET | Freq: Two times a day (BID) | ORAL | 3 refills | Status: DC

## 2017-12-12 MED ORDER — SODIUM CHLORIDE 0.9 % IV SOLN
Freq: Once | INTRAVENOUS | Status: AC
  Administered 2017-12-12: 10:00:00 via INTRAVENOUS
  Filled 2017-12-12: qty 250

## 2017-12-12 MED ORDER — SODIUM CHLORIDE 0.9% IV SOLUTION
250.0000 mL | Freq: Once | INTRAVENOUS | Status: AC
  Filled 2017-12-12: qty 250

## 2017-12-12 MED ORDER — ACETAMINOPHEN 325 MG PO TABS
ORAL_TABLET | ORAL | Status: AC
  Filled 2017-12-12: qty 2

## 2017-12-12 MED ORDER — DIPHENHYDRAMINE HCL 25 MG PO CAPS
ORAL_CAPSULE | ORAL | Status: AC
  Filled 2017-12-12: qty 1

## 2017-12-12 MED ORDER — SODIUM CHLORIDE 0.9 % IV SOLN
510.0000 mg | Freq: Once | INTRAVENOUS | Status: AC
  Administered 2017-12-12: 510 mg via INTRAVENOUS
  Filled 2017-12-12: qty 17

## 2017-12-12 MED FILL — FAMOTIDINE 40 MG TABLET: 40 | 30 days supply | Qty: 60 | Fill #0

## 2017-12-12 NOTE — Patient Instructions (Addendum)

## 2017-12-15 ENCOUNTER — Telehealth: Payer: Self-pay | Admitting: Hematology & Oncology

## 2017-12-15 ENCOUNTER — Encounter: Payer: Self-pay | Admitting: Hematology & Oncology

## 2017-12-15 ENCOUNTER — Ambulatory Visit (HOSPITAL_COMMUNITY)
Admission: RE | Admit: 2017-12-15 | Discharge: 2017-12-15 | Disposition: A | Discharge status: Home / Self Care | Payer: 59 | Source: Ambulatory Visit | Attending: Hematology & Oncology | Admitting: Hematology & Oncology

## 2017-12-15 ENCOUNTER — Encounter (HOSPITAL_COMMUNITY): Payer: Self-pay

## 2017-12-15 ENCOUNTER — Encounter: Payer: Self-pay | Admitting: *Deleted

## 2017-12-15 DIAGNOSIS — Z5111 Encounter for antineoplastic chemotherapy: Secondary | ICD-10-CM

## 2017-12-15 DIAGNOSIS — C7951 Secondary malignant neoplasm of bone: Secondary | ICD-10-CM | POA: Insufficient documentation

## 2017-12-15 DIAGNOSIS — Z79899 Other long term (current) drug therapy: Secondary | ICD-10-CM | POA: Diagnosis not present

## 2017-12-15 DIAGNOSIS — D500 Iron deficiency anemia secondary to blood loss (chronic): Secondary | ICD-10-CM | POA: Insufficient documentation

## 2017-12-15 DIAGNOSIS — C787 Secondary malignant neoplasm of liver and intrahepatic bile duct: Secondary | ICD-10-CM | POA: Diagnosis not present

## 2017-12-15 DIAGNOSIS — C160 Malignant neoplasm of cardia: Secondary | ICD-10-CM

## 2017-12-15 DIAGNOSIS — Z9889 Other specified postprocedural states: Secondary | ICD-10-CM | POA: Insufficient documentation

## 2017-12-15 DIAGNOSIS — C778 Secondary and unspecified malignant neoplasm of lymph nodes of multiple regions: Secondary | ICD-10-CM | POA: Diagnosis not present

## 2017-12-15 HISTORY — PX: IR IMAGING GUIDED PORT INSERTION: IMG5740

## 2017-12-15 LAB — BASIC METABOLIC PANEL
ANION GAP: 9 (ref 5–15)
BUN: 6 mg/dL (ref 6–20)
CALCIUM: 8.6 mg/dL — AB (ref 8.9–10.3)
CHLORIDE: 102 mmol/L (ref 98–111)
CO2: 26 mmol/L (ref 22–32)
Creatinine, Ser: 0.94 mg/dL (ref 0.61–1.24)
GFR calc non Af Amer: >60 mL/min (ref >60)
Glucose, Bld: 106 mg/dL — ABNORMAL HIGH (ref 70–99)
Potassium: 4.1 mmol/L (ref 3.5–5.1)
Sodium: 137 mmol/L (ref 135–145)

## 2017-12-15 LAB — CBC
HEMATOCRIT: 34.3 % — AB (ref 39.0–52.0)
Hemoglobin: 10.9 g/dL — ABNORMAL LOW (ref 13.0–17.0)
MCH: 28.2 pg (ref 26.0–34.0)
MCHC: 31.8 g/dL (ref 30.0–36.0)
MCV: 88.9 fL (ref 78.0–100.0)
Platelets: 316 10*3/uL (ref 150–400)
RBC: 3.86 MIL/uL — ABNORMAL LOW (ref 4.22–5.81)
RDW: 13.5 % (ref 11.5–15.5)
WBC: 6 10*3/uL (ref 4.0–10.5)

## 2017-12-15 LAB — TYPE AND SCREEN
ABO/RH(D): O POS
Antibody Screen: NEGATIVE
UNIT DIVISION: 0
Unit division: 0

## 2017-12-15 LAB — BPAM RBC
BLOOD PRODUCT EXPIRATION DATE: 201910232359
Blood Product Expiration Date: 201910232359
ISSUE DATE / TIME: 201909270735
ISSUE DATE / TIME: 201909270735
Unit Type and Rh: 5100
Unit Type and Rh: 5100

## 2017-12-15 LAB — PROTIME-INR
INR: 1.08
PROTHROMBIN TIME: 13.9 s (ref 11.4–15.2)

## 2017-12-15 MED ORDER — CEFAZOLIN SODIUM-DEXTROSE 2-4 GM/100ML-% IV SOLN
2.0000 g | INTRAVENOUS | Status: AC
  Administered 2017-12-15: 2 g via INTRAVENOUS

## 2017-12-15 MED ORDER — LIDOCAINE HCL 1 % IJ SOLN
INTRAMUSCULAR | Status: AC
  Filled 2017-12-15: qty 20

## 2017-12-15 MED ORDER — HEPARIN SOD (PORK) LOCK FLUSH 100 UNIT/ML IV SOLN
INTRAVENOUS | Status: AC
  Filled 2017-12-15: qty 5

## 2017-12-15 MED ORDER — LIDOCAINE HCL (PF) 1 % IJ SOLN
INTRAMUSCULAR | Status: AC | PRN
  Administered 2017-12-15: 15 mL

## 2017-12-15 MED ORDER — MIDAZOLAM HCL 2 MG/2ML IJ SOLN
INTRAMUSCULAR | Status: AC | PRN
  Administered 2017-12-15: 2 mg via INTRAVENOUS
  Administered 2017-12-15 (×3): 0.5 mg via INTRAVENOUS

## 2017-12-15 MED ORDER — FENTANYL CITRATE (PF) 100 MCG/2ML IJ SOLN
INTRAMUSCULAR | Status: AC
  Filled 2017-12-15: qty 4

## 2017-12-15 MED ORDER — CEFAZOLIN SODIUM-DEXTROSE 2-4 GM/100ML-% IV SOLN
INTRAVENOUS | Status: AC
  Filled 2017-12-15: qty 100

## 2017-12-15 MED ORDER — FENTANYL CITRATE (PF) 100 MCG/2ML IJ SOLN
INTRAMUSCULAR | Status: AC | PRN
  Administered 2017-12-15: 25 ug via INTRAVENOUS
  Administered 2017-12-15: 50 ug via INTRAVENOUS
  Administered 2017-12-15: 25 ug via INTRAVENOUS

## 2017-12-15 MED ORDER — SODIUM CHLORIDE 0.9 % IV SOLN: INTRAVENOUS | Status: DC

## 2017-12-15 MED ORDER — MIDAZOLAM HCL 2 MG/2ML IJ SOLN
INTRAMUSCULAR | Status: AC
  Filled 2017-12-15: qty 4

## 2017-12-15 MED ORDER — SODIUM CHLORIDE 0.9 % IV SOLN
INTRAVENOUS | Status: AC | PRN
  Administered 2017-12-15: 10 mL/h via INTRAVENOUS

## 2017-12-15 NOTE — Discharge Instructions (Addendum)
Implanted Port Insertion, Care After °This sheet gives you information about how to care for yourself after your procedure. Your health care provider may also give you more specific instructions. If you have problems or questions, contact your health care provider. °What can I expect after the procedure? °After your procedure, it is common to have: °· Discomfort at the port insertion site. °· Bruising on the skin over the port. This should improve over 3-4 days. ° °Follow these instructions at home: °Port care °· After your port is placed, you will get a manufacturer's information card. The card has information about your port. Keep this card with you at all times. °· Take care of the port as told by your health care provider. Ask your health care provider if you or a family member can get training for taking care of the port at home. A home health care nurse may also take care of the port. °· Make sure to remember what type of port you have. °Incision care °· Follow instructions from your health care provider about how to take care of your port insertion site. Make sure you: °? Wash your hands with soap and water before you change your bandage (dressing). If soap and water are not available, use hand sanitizer. °? Change your dressing as told by your health care provider. °? Leave stitches (sutures), skin glue, or adhesive strips in place. These skin closures may need to stay in place for 2 weeks or longer. If adhesive strip edges start to loosen and curl up, you may trim the loose edges. Do not remove adhesive strips completely unless your health care provider tells you to do that. °· Check your port insertion site every day for signs of infection. Check for: °? More redness, swelling, or pain. °? More fluid or blood. °? Warmth. °? Pus or a bad smell. °General instructions °· Do not take baths, swim, or use a hot tub until your health care provider approves. °· Do not lift anything that is heavier than 10 lb (4.5  kg) for a week, or as told by your health care provider. °· Ask your health care provider when it is okay to: °? Return to work or school. °? Resume usual physical activities or sports. °· Do not drive for 24 hours if you were given a medicine to help you relax (sedative). °· Take over-the-counter and prescription medicines only as told by your health care provider. °· Wear a medical alert bracelet in case of an emergency. This will tell any health care providers that you have a port. °· Keep all follow-up visits as told by your health care provider. This is important. °Contact a health care provider if: °· You cannot flush your port with saline as directed, or you cannot draw blood from the port. °· You have a fever or chills. °· You have more redness, swelling, or pain around your port insertion site. °· You have more fluid or blood coming from your port insertion site. °· Your port insertion site feels warm to the touch. °· You have pus or a bad smell coming from the port insertion site. °Get help right away if: °· You have chest pain or shortness of breath. °· You have bleeding from your port that you cannot control. °Summary °· Take care of the port as told by your health care provider. °· Change your dressing as told by your health care provider. °· Keep all follow-up visits as told by your health care provider. °  This information is not intended to replace advice given to you by your health care provider. Make sure you discuss any questions you have with your health care provider. °Document Released: 12/23/2012 Document Revised: 01/24/2016 Document Reviewed: 01/24/2016 °Elsevier Interactive Patient Education © 2017 [employer] °Moderate Conscious Sedation, Adult, Care After °These instructions provide you with information about caring for yourself after your procedure. Your health care provider may also give you more specific instructions. Your treatment has been planned according to current medical  practices, but problems sometimes occur. Call your health care provider if you have any problems or questions after your procedure. °What can I expect after the procedure? °After your procedure, it is common: °· To feel sleepy for several hours. °· To feel clumsy and have poor balance for several hours. °· To have poor judgment for several hours. °· To vomit if you eat too soon. ° °Follow these instructions at home: °For at least 24 hours after the procedure: ° °· Do not: °? Participate in activities where you could fall or become injured. °? Drive. °? Use heavy machinery. °? Drink alcohol. °? Take sleeping pills or medicines that cause drowsiness. °? Make important decisions or sign legal documents. °? Take care of children on your own. °· Rest. °Eating and drinking °· Follow the diet recommended by your health care provider. °· If you vomit: °? Drink water, juice, or soup when you can drink without vomiting. °? Make sure you have little or no nausea before eating solid foods. °General instructions °· Have a responsible adult stay with you until you are awake and alert. °· Take over-the-counter and prescription medicines only as told by your health care provider. °· If you smoke, do not smoke without supervision. °· Keep all follow-up visits as told by your health care provider. This is important. °Contact a health care provider if: °· You keep feeling nauseous or you keep vomiting. °· You feel light-headed. °· You develop a rash. °· You have a fever. °Get help right away if: °· You have trouble breathing. °This information is not intended to replace advice given to you by your health care provider. Make sure you discuss any questions you have with your health care provider. °Document Released: 12/23/2012 Document Revised: 08/07/2015 Document Reviewed: 06/24/2015 °Elsevier Interactive Patient Education © 2018 [employer] ° °

## 2017-12-15 NOTE — H&P (Signed)
Chief Complaint: scheduled for [facility] a cath placement as ordered per Dr Pearletha Alfred   Referring Physician(s): [name]  Supervising Physician: Corrie Mckusick  Patient Status: [facility] - Out-pt  History of Present Illness: Matthew Reynolds is a 52 y.o. 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 a Cath placement    Past Medical History:  Diagnosis Date  . 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 Goals of care, counseling/discussion 12/10/2017  . Iron deficiency anemia due to chronic blood loss 12/11/2017  . 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 CYST EXCISION Left 01/15/2016   Procedure: EXCISION SEBACEOUS CYST LEFT UPPER BACK;  Surgeon: Georganna Skeans, MD;  Location: [place];  Service: General;  Laterality: Left;    Allergies: Patient has no known allergies.  Medications: Prior to Admission medications   Medication Sig Start Date End Date Taking? Authorizing Provider  famotidine (PEPCID) 40 MG tablet Take 1 tablet (40 mg total) by mouth 2 (two) times daily. 12/12/17  Yes [name], MD  rosuvastatin (CRESTOR) 5 MG tablet Take 5 mg by mouth daily. 11/28/17  Yes [provider]  bisacodyl (DULCOLAX) 5 MG EC tablet Take 5 mg by mouth daily as needed for moderate constipation.    [provider]  diphenhydramine-acetaminophen (TYLENOL PM) 25-500 MG TABS tablet Take 1 tablet by mouth at bedtime as needed.    [provider]  ranitidine (ZANTAC) 150 MG tablet Take 250 mg by mouth 2 (two) times daily.    [provider]     History reviewed. No pertinent family history.  Social History   Socioeconomic History  . Marital status: Married    Spouse name: Not on file  . Number of  children: Not on file  . Years of education: Not on file  . Highest education level: Not on file  Occupational History  . Not on file  Social Needs  . Financial resource strain: Not on file  . Food insecurity:    Worry: Not on file    Inability: Not on file  . Transportation needs:    Medical: Not on file    Non-medical: Not on file  Tobacco Use  . Smoking status: Never Smoker  . Smokeless tobacco: Never Used  Substance and Sexual Activity  . Alcohol use: Yes    Comment: weekly  . Drug use: No  . Sexual activity: Not on file  Lifestyle  . Physical activity:    Days per week: Not on file    Minutes per session: Not on file  . Stress: Not on file  Relationships  . Social connections:    Talks on phone: Not on file    Gets together: Not on file    Attends religious service: Not on file    Active member of club or organization: Not on file    Attends meetings of clubs or organizations: Not on file    Relationship status: Not on file  Other Topics Concern  . Not on file  Social History Narrative  . Not on file    Review of Systems: A 12 point ROS discussed and pertinent positives are indicated in the HPI above.  All other systems are negative.  Review of Systems  Constitutional: Negative for fatigue and fever.  Respiratory: Negative for cough and shortness of breath.   Cardiovascular: Negative for chest pain.  Gastrointestinal: Negative for abdominal pain.  Neurological: Negative for weakness.  Psychiatric/Behavioral: Negative for behavioral problems and confusion.    Vital Signs: BP 120/86 (BP Location: Right Arm)   Pulse 84   Temp 97.8 F (36.6 C) (Oral)   Ht 5' 9" (1.753 m)   Wt 195 lb (88.5 kg)   SpO2 100%   BMI 28.80 kg/m   Physical Exam  Constitutional: He is oriented to person, place, and time.  Cardiovascular: Normal rate, regular rhythm and normal heart sounds.  Pulmonary/Chest: Effort normal and breath sounds normal.  Abdominal: Soft. Bowel  sounds are normal.  Musculoskeletal: Normal range of motion.  Neurological: He is alert and oriented to person, place, and time.  Skin: Skin is warm and dry.  Psychiatric: He has a normal mood and affect. His behavior is normal. Judgment and thought content normal.  Vitals reviewed.   Imaging: Mr Brain W Wo Contrast  Result Date: 12/01/2017 CLINICAL DATA:  52 y/o  M; esophageal cancer, initial workup. EXAM: MRI HEAD WITHOUT AND WITH CONTRAST TECHNIQUE: Multiplanar, multiecho pulse sequences of the brain and surrounding structures were obtained without and with intravenous contrast. CONTRAST:  10 cc MultiHance. COMPARISON:  None. FINDINGS: Brain: No acute infarction, hemorrhage, hydrocephalus, extra-axial collection or mass lesion. Few punctate foci of T2 FLAIR hyperintense signal abnormality in bifrontal subcortical white matter of unlikely clinical significance given age. After administration of intravenous contrast there is no abnormal enhancement of the brain. Vascular: Normal flow voids. Skull and upper cervical spine: Normal marrow signal. Sinuses/Orbits: Negative. Other: None. IMPRESSION: No intracranial metastatic disease identified. Unremarkable MRI of the brain for age. Electronically Signed   By: Lance  Furusawa-Stratton M.D.   On: 12/01/2017 21:42   Nm Pet Image Initial (pi) Skull Base To Thigh  Result Date: 12/08/2017 CLINICAL DATA:  Initial treatment strategy for esophageal cancer. EXAM: NUCLEAR MEDICINE PET SKULL BASE TO THIGH TECHNIQUE: 9.7 mCi F-18 FDG was injected intravenously. Full-ring PET imaging was performed from the skull base to thigh after the radiotracer. CT data was obtained and used for attenuation correction and anatomic localization. Fasting blood glucose: 115 mg/dl COMPARISON:  None. FINDINGS: Mediastinal blood pool activity: SUV max 2.2 NECK: Hypermetabolic LEFT supraclavicular lymph node is small at 1 cm but with intense metabolic activity for size with SUV max equal  5.6 Incidental CT findings: none CHEST: Intense metabolic activity involving the distal esophagus up to the GE junction with SUV max equal 17. Activity in extends into the gastric cardiac region. There is fluid within the esophagus. Hypermetabolic lymph node between the esophagus and bronchus intermedius SUV max equal 8.3. Small hypermetabolic paratracheal lymph nodes. There is a thin rim of hypermetabolic activity along the anterior margin of the diaphragm and over the RIGHT hepatic lobe consistent with metastasis along the diaphragm (SUV max equal 7.0 anteriorly on image 105). Incidental CT findings: none ABDOMEN/PELVIS: Hypermetabolic lesion in the RIGHT hepatic lobe SUV max equal 12.7. Three smaller lesions in the more superior RIGHT hepatic lobe in central LEFT hepatic lobe on image 94. Intensely hypermetabolic lymph nodes in the gastrohepatic ligament with SUV max equal 12.9. Hypermetabolic lymph nodes in the upper abdomen in the periaortic retroperitoneum with SUV max equal 12.5. Hypermetabolic nodule in the deep pelvis just superior to the LEFT seminal vesicle SUV max equal 8.2. This small nodule measures 7 mm (image 186/4). Moderate volume intraperitoneal free fluid collects in   the pelvis. Incidental CT findings: Large volume intraperitoneal free fluid collects in the pelvis. SKELETON: Hypermetabolic lesion centrally within the T7 vertebral body with SUV max equal 5.6. No clear CT lesion to correlate. Incidental CT findings: none IMPRESSION: 1. Intense hypermetabolic activity in distal esophagus extending into the gastric cardia consistent with primary esophageal carcinoma. 2. Hypermetabolic mediastinal nodal metastasis and LEFT supraclavicular nodal metastasis. 3. Nodal metastasis in the abdomen to the gastrohepatic ligament and upper abdominal periaortic lymph nodes. 4. Four hypermetabolic HEPATIC METASTASIS. 5. Hypermetabolic thickening along the central and RIGHT diaphragm consistent with peritoneal or  pleural metastasis. 6. Small nodule in the deep LEFT pelvis most consistent with PERITONEAL METASTASIS. 7. Solitary SKELETAL METASTASIS to T7 vertebral body. Electronically Signed   By: Stewart  Edmunds M.D.   On: 12/08/2017 16:53   Us Biopsy (liver)  Result Date: 12/04/2017 INDICATION: 52-year-old male with a history of esophageal cancer and multiple lesions on CT scan. He has been referred for biopsy EXAM: ULTRASOUND-GUIDED BIOPSY LIVER LESION MEDICATIONS: None. ANESTHESIA/SEDATION: Moderate (conscious) sedation was employed during this procedure. A total of Versed 3.0 mg and Fentanyl 100 mcg was administered intravenously. Moderate Sedation Time: 10 minutes. The patient's level of consciousness and vital signs were monitored continuously by radiology nursing throughout the procedure under my direct supervision. FLUOROSCOPY TIME:  Ultrasound COMPLICATIONS: None PROCEDURE: Informed written consent was obtained from the patient after a thorough discussion of the procedural risks, benefits and alternatives. All questions were addressed. Maximal Sterile Barrier Technique was utilized including caps, mask, sterile gowns, sterile gloves, sterile drape, hand hygiene and skin antiseptic. A timeout was performed prior to the initiation of the procedure. Patient positioned supine position on the ultrasound table, right anterior oblique. Ultrasound images were acquired with images stored sent to PACs. Patient is prepped and draped in the usual sterile fashion. 1% lidocaine was used for local anesthesia. Using ultrasound guidance, hyperechoic lesion of the right liver was targeted with a 17 gauge guide needle. Once we confirmed needle position, multiple 18 gauge core biopsy were acquired. Two Gel-Foam pledgets were infused in the needle was removed. Final images were stored. Patient tolerated the procedure well and remained hemodynamically stable throughout. No complications were encountered and no significant blood loss.  IMPRESSION: Status post ultrasound-guided biopsy of right liver lesion. Tissue specimen sent to pathology for complete histopathologic analysis. Signed, Jaime S. Wagner, DO, RPVI [facility] Electronically Signed   By: Jaime  Wagner D.O.   On: 12/04/2017 13:56    Labs:  CBC: Recent Labs    11/21/17 1448 12/04/17 1115 12/11/17 1339 12/15/17 1047  WBC 6.8 6.4 9.0 6.0  HGB 16.4 13.5 8.0* 10.9*  HCT 48.3 39.4 24.5* 34.3*  PLT 147 281 321 316    COAGS: Recent Labs    12/04/17 1115  INR 0.99    BMP: Recent Labs    11/14/17 0010 11/21/17 1448 12/04/17 1115 12/11/17 1339  NA 137 135 139 132  K 3.7 3.9 4.1 4.4  CL 103 108 103 106  CO2 22 28 26 29  GLUCOSE 140* 126* 112* 112  BUN 10 9 13 15  CALCIUM 8.6* 9.0 9.0 8.5  CREATININE 0.90 1.30* 0.85 0.90  GFRNONAA >60  --  >60  --   GFRAA >60  --  >60  --     LIVER FUNCTION TESTS: Recent Labs    11/14/17 0010 11/21/17 1448 12/04/17 1115 12/11/17 1339  BILITOT 0.9 0.5 0.7 0.5  AST 31   36 70* 44*  ALT 45* 61* 126* 93*  ALKPHOS 90 111* 158* 176*  PROT 7.0 7.1 6.6 6.1*  ALBUMIN 3.6 3.7 3.3* 3.0*    TUMOR MARKERS: No results for input(s): AFPTM, CEA, CA199, CHROMGRNA in the last 8760 hours.  Assessment and Plan:  Gastro esophageal Ca Liver; Lymph; bony metastasis Scheduled for chemo Wednesday Port a cath placement in IR today Risks and benefits of image guided port-a-catheter placement was discussed with the patient including, but not limited to bleeding, infection, pneumothorax, or fibrin sheath development and need for additional procedures. All of the patient's questions were answered, patient is agreeable to proceed. Consent signed and in chart.   Thank you for this interesting consult.  I greatly enjoyed meeting Matthew Reynolds and look forward to participating in their care.  A copy of this report was sent to the requesting provider on this  date.  Electronically Signed: Lavonia Drafts, PA-C 12/15/2017, 11:18 AM   I spent a total of  30 Minutes   in face to face in clinical consultation, greater than 50% of which was counseling/coordinating care for [facility]

## 2017-12-15 NOTE — Procedures (Signed)
Interventional Radiology Procedure Note  Procedure: Placement of a right IJ approach single lumen PowerPort.  Tip is positioned at the superior cavoatrial junction and catheter is ready for immediate use.  Complications: None Recommendations:  - Ok to shower tomorrow - Do not submerge for 7 days - Routine line care   Signed,  Korban Shearer S. Aleasha Fregeau, DO   

## 2017-12-15 NOTE — Telephone Encounter (Signed)
lmom to inform patient of chemo ed aappt 10/ at 9 am at [facility] and echo appt 10/1 at 11 am at [facility]. Also informed patient of treatment start date of 10/2 at 8 am at [facility] location

## 2017-12-16 ENCOUNTER — Ambulatory Visit: Payer: 59

## 2017-12-16 ENCOUNTER — Other Ambulatory Visit: Payer: 59

## 2017-12-16 ENCOUNTER — Other Ambulatory Visit: Payer: Self-pay | Admitting: *Deleted

## 2017-12-16 ENCOUNTER — Inpatient Hospital Stay: Payer: 59 | Attending: Hematology & Oncology

## 2017-12-16 ENCOUNTER — Ambulatory Visit (HOSPITAL_COMMUNITY)
Admission: RE | Admit: 2017-12-16 | Discharge: 2017-12-16 | Disposition: A | Discharge status: Home / Self Care | Payer: 59 | Source: Ambulatory Visit | Attending: Hematology & Oncology | Admitting: Hematology & Oncology

## 2017-12-16 ENCOUNTER — Encounter: Payer: Self-pay | Admitting: *Deleted

## 2017-12-16 DIAGNOSIS — Z5112 Encounter for antineoplastic immunotherapy: Secondary | ICD-10-CM | POA: Insufficient documentation

## 2017-12-16 DIAGNOSIS — Z79899 Other long term (current) drug therapy: Secondary | ICD-10-CM | POA: Insufficient documentation

## 2017-12-16 DIAGNOSIS — C160 Malignant neoplasm of cardia: Secondary | ICD-10-CM

## 2017-12-16 DIAGNOSIS — D500 Iron deficiency anemia secondary to blood loss (chronic): Secondary | ICD-10-CM | POA: Insufficient documentation

## 2017-12-16 DIAGNOSIS — Z5111 Encounter for antineoplastic chemotherapy: Secondary | ICD-10-CM | POA: Insufficient documentation

## 2017-12-16 DIAGNOSIS — Z452 Encounter for adjustment and management of vascular access device: Secondary | ICD-10-CM | POA: Insufficient documentation

## 2017-12-16 DIAGNOSIS — K5900 Constipation, unspecified: Secondary | ICD-10-CM | POA: Insufficient documentation

## 2017-12-16 DIAGNOSIS — C799 Secondary malignant neoplasm of unspecified site: Principal | ICD-10-CM

## 2017-12-16 DIAGNOSIS — C787 Secondary malignant neoplasm of liver and intrahepatic bile duct: Secondary | ICD-10-CM | POA: Insufficient documentation

## 2017-12-16 DIAGNOSIS — K921 Melena: Secondary | ICD-10-CM | POA: Insufficient documentation

## 2017-12-16 MED ORDER — TEMAZEPAM 15 MG PO CAPS: 15.0000 mg | ORAL_CAPSULE | Freq: Every evening | ORAL | 0 refills | Status: DC | PRN

## 2017-12-16 MED ORDER — LORAZEPAM 0.5 MG PO TABS: 0.5000 mg | ORAL_TABLET | Freq: Three times a day (TID) | ORAL | 0 refills | Status: DC

## 2017-12-16 MED ORDER — DEXAMETHASONE 4 MG PO TABS: 8.0000 mg | ORAL_TABLET | Freq: Every day | ORAL | 1 refills | Status: DC

## 2017-12-16 MED ORDER — LORAZEPAM 0.5 MG PO TABS: 0.5000 mg | ORAL_TABLET | Freq: Four times a day (QID) | ORAL | 0 refills | Status: DC | PRN

## 2017-12-16 MED ORDER — LIDOCAINE-PRILOCAINE 2.5-2.5 % EX CREA: TOPICAL_CREAM | CUTANEOUS | 3 refills | Status: DC

## 2017-12-16 MED ORDER — PROCHLORPERAZINE MALEATE 10 MG PO TABS: 10.0000 mg | ORAL_TABLET | Freq: Four times a day (QID) | ORAL | 1 refills | Status: DC | PRN

## 2017-12-16 MED ORDER — ONDANSETRON HCL 8 MG PO TABS: 8.0000 mg | ORAL_TABLET | Freq: Two times a day (BID) | ORAL | 1 refills | Status: DC | PRN

## 2017-12-16 NOTE — Progress Notes (Signed)
Echocardiogram 2D Echocardiogram has been performed.  Matthew Reynolds 12/16/2017, 11:33 AM

## 2017-12-17 ENCOUNTER — Inpatient Hospital Stay: Payer: 59

## 2017-12-17 DIAGNOSIS — K921 Melena: Secondary | ICD-10-CM | POA: Diagnosis not present

## 2017-12-17 DIAGNOSIS — Z79899 Other long term (current) drug therapy: Secondary | ICD-10-CM | POA: Diagnosis not present

## 2017-12-17 DIAGNOSIS — D500 Iron deficiency anemia secondary to blood loss (chronic): Secondary | ICD-10-CM

## 2017-12-17 DIAGNOSIS — Z5111 Encounter for antineoplastic chemotherapy: Secondary | ICD-10-CM | POA: Diagnosis not present

## 2017-12-17 DIAGNOSIS — Z5112 Encounter for antineoplastic immunotherapy: Secondary | ICD-10-CM | POA: Diagnosis not present

## 2017-12-17 DIAGNOSIS — C160 Malignant neoplasm of cardia: Secondary | ICD-10-CM | POA: Diagnosis not present

## 2017-12-17 DIAGNOSIS — K5900 Constipation, unspecified: Secondary | ICD-10-CM | POA: Diagnosis not present

## 2017-12-17 DIAGNOSIS — C799 Secondary malignant neoplasm of unspecified site: Principal | ICD-10-CM

## 2017-12-17 DIAGNOSIS — Z452 Encounter for adjustment and management of vascular access device: Secondary | ICD-10-CM | POA: Diagnosis not present

## 2017-12-17 DIAGNOSIS — C787 Secondary malignant neoplasm of liver and intrahepatic bile duct: Secondary | ICD-10-CM | POA: Diagnosis not present

## 2017-12-17 LAB — CBC WITH DIFFERENTIAL (CANCER CENTER ONLY)
BASOS ABS: 0 10*3/uL (ref 0.0–0.1)
BASOS PCT: 1 %
EOS ABS: 0 10*3/uL (ref 0.0–0.5)
EOS PCT: 1 %
HCT: 34 % — ABNORMAL LOW (ref 38.7–49.9)
Hemoglobin: 11.1 g/dL — ABNORMAL LOW (ref 13.0–17.1)
Lymphocytes Relative: 16 %
Lymphs Abs: 0.7 10*3/uL — ABNORMAL LOW (ref 0.9–3.3)
MCH: 28.8 pg (ref 28.0–33.4)
MCHC: 32.6 g/dL (ref 32.0–35.9)
MCV: 88.1 fL (ref 82.0–98.0)
MONO ABS: 0.7 10*3/uL (ref 0.1–0.9)
Monocytes Relative: 16 %
Neutro Abs: 2.7 10*3/uL (ref 1.5–6.5)
Neutrophils Relative %: 66 %
PLATELETS: 275 10*3/uL (ref 145–400)
RBC: 3.86 MIL/uL — ABNORMAL LOW (ref 4.20–5.70)
RDW: 14.9 % (ref 11.1–15.7)
WBC Count: 4.1 10*3/uL (ref 4.0–10.0)

## 2017-12-17 LAB — CMP (CANCER CENTER ONLY)
ALBUMIN: 3.1 g/dL — AB (ref 3.5–5.0)
ALK PHOS: 243 U/L — AB (ref 26–84)
ALT: 77 U/L — ABNORMAL HIGH (ref 10–47)
AST: 53 U/L — AB (ref 11–38)
Anion gap: 0 — ABNORMAL LOW (ref 5–15)
BUN: 8 mg/dL (ref 7–22)
CALCIUM: 9 mg/dL (ref 8.0–10.3)
CO2: 28 mmol/L (ref 18–33)
CREATININE: 0.7 mg/dL (ref 0.60–1.20)
Chloride: 109 mmol/L — ABNORMAL HIGH (ref 98–108)
Glucose, Bld: 117 mg/dL (ref 73–118)
Potassium: 4 mmol/L (ref 3.3–4.7)
SODIUM: 135 mmol/L (ref 128–145)
Total Bilirubin: 0.5 mg/dL (ref 0.2–1.6)
Total Protein: 6.3 g/dL — ABNORMAL LOW (ref 6.4–8.1)

## 2017-12-17 MED ORDER — DIPHENHYDRAMINE HCL 25 MG PO CAPS
50.0000 mg | ORAL_CAPSULE | Freq: Once | ORAL | Status: AC
  Administered 2017-12-17: 50 mg via ORAL

## 2017-12-17 MED ORDER — KETOROLAC TROMETHAMINE 30 MG/ML IJ SOLN
30.0000 mg | Freq: Once | INTRAMUSCULAR | Status: AC
  Administered 2017-12-17: 30 mg via INTRAVENOUS
  Filled 2017-12-17: qty 1

## 2017-12-17 MED ORDER — ACETAMINOPHEN 325 MG PO TABS
ORAL_TABLET | ORAL | Status: AC
  Filled 2017-12-17: qty 2

## 2017-12-17 MED ORDER — DEXAMETHASONE SODIUM PHOSPHATE 10 MG/ML IJ SOLN
INTRAMUSCULAR | Status: AC
  Filled 2017-12-17: qty 1

## 2017-12-17 MED ORDER — PALONOSETRON HCL INJECTION 0.25 MG/5ML
INTRAVENOUS | Status: AC
  Filled 2017-12-17: qty 5

## 2017-12-17 MED ORDER — KETOROLAC TROMETHAMINE 15 MG/ML IJ SOLN
INTRAMUSCULAR | Status: AC
  Filled 2017-12-17: qty 2

## 2017-12-17 MED ORDER — KETOROLAC TROMETHAMINE 10 MG PO TABS: 10.0000 mg | ORAL_TABLET | Freq: Four times a day (QID) | ORAL | 2 refills | Status: DC | PRN

## 2017-12-17 MED ORDER — TRASTUZUMAB CHEMO 150 MG IV SOLR
4.0000 mg/kg | Freq: Once | INTRAVENOUS | Status: AC
  Administered 2017-12-17: 357 mg via INTRAVENOUS
  Filled 2017-12-17: qty 17

## 2017-12-17 MED ORDER — DIPHENHYDRAMINE HCL 25 MG PO CAPS
ORAL_CAPSULE | ORAL | Status: AC
  Filled 2017-12-17: qty 2

## 2017-12-17 MED ORDER — SODIUM CHLORIDE 0.9 % IV SOLN
Freq: Once | INTRAVENOUS | Status: AC
  Administered 2017-12-17: 10:00:00 via INTRAVENOUS
  Filled 2017-12-17: qty 250

## 2017-12-17 MED ORDER — LEUCOVORIN CALCIUM INJECTION 350 MG
400.0000 mg/m2 | Freq: Once | INTRAVENOUS | Status: AC
  Administered 2017-12-17: 832 mg via INTRAVENOUS
  Filled 2017-12-17: qty 41.6

## 2017-12-17 MED ORDER — ACETAMINOPHEN 325 MG PO TABS
650.0000 mg | ORAL_TABLET | Freq: Once | ORAL | Status: AC
  Administered 2017-12-17: 650 mg via ORAL

## 2017-12-17 MED ORDER — OXALIPLATIN CHEMO INJECTION 100 MG/20ML
85.0000 mg/m2 | Freq: Once | INTRAVENOUS | Status: AC
  Administered 2017-12-17: 175 mg via INTRAVENOUS
  Filled 2017-12-17: qty 35

## 2017-12-17 MED ORDER — ACETAMINOPHEN 500 MG PO TABS
ORAL_TABLET | ORAL | Status: AC
  Filled 2017-12-17: qty 2

## 2017-12-17 MED ORDER — DEXTROSE 5 % IV SOLN
Freq: Once | INTRAVENOUS | Status: AC
  Administered 2017-12-17: 12:00:00 via INTRAVENOUS
  Filled 2017-12-17: qty 250

## 2017-12-17 MED ORDER — DEXAMETHASONE SODIUM PHOSPHATE 10 MG/ML IJ SOLN
10.0000 mg | Freq: Once | INTRAMUSCULAR | Status: AC
  Administered 2017-12-17: 10 mg via INTRAVENOUS

## 2017-12-17 MED ORDER — PALONOSETRON HCL INJECTION 0.25 MG/5ML
0.2500 mg | Freq: Once | INTRAVENOUS | Status: AC
  Administered 2017-12-17: 0.25 mg via INTRAVENOUS

## 2017-12-17 MED ORDER — FLUOROURACIL CHEMO INJECTION 2.5 GM/50ML
400.0000 mg/m2 | Freq: Once | INTRAVENOUS | Status: AC
  Administered 2017-12-17: 850 mg via INTRAVENOUS
  Filled 2017-12-17: qty 17

## 2017-12-17 MED ORDER — SODIUM CHLORIDE 0.9 % IV SOLN
2400.0000 mg/m2 | INTRAVENOUS | Status: DC
  Administered 2017-12-17: 5000 mg via INTRAVENOUS
  Filled 2017-12-17: qty 100

## 2017-12-17 MED FILL — KETOROLAC 10 MG TABLET: 10 | 7 days supply | Qty: 30 | Fill #0

## 2017-12-17 NOTE — Patient Instructions (Signed)
Leucovorin injection What is this medicine? LEUCOVORIN (loo koe VOR in) is used to prevent or treat the harmful effects of some medicines. This medicine is used to treat anemia caused by a low amount of folic acid in the body. It is also used with 5-fluorouracil (5-FU) to treat colon cancer. This medicine may be used for other purposes; ask your health care provider or pharmacist if you have questions. What should I tell my health care provider before I take this medicine? They need to know if you have any of these conditions: -anemia from low levels of vitamin B-12 in the blood -an unusual or allergic reaction to leucovorin, folic acid, other medicines, foods, dyes, or preservatives -pregnant or trying to get pregnant -breast-feeding How should I use this medicine? This medicine is for injection into a muscle or into a vein. It is given by a health care professional in a hospital or clinic setting. Talk to your pediatrician regarding the use of this medicine in children. Special care may be needed. Overdosage: If you think you have taken too much of this medicine contact a poison control center or emergency room at once. NOTE: This medicine is only for you. Do not share this medicine with others. What if I miss a dose? This does not apply. What may interact with this medicine? -capecitabine -fluorouracil -phenobarbital -phenytoin -primidone -trimethoprim-sulfamethoxazole This list may not describe all possible interactions. Give your health care provider a list of all the medicines, herbs, non-prescription drugs, or dietary supplements you use. Also tell them if you smoke, drink alcohol, or use illegal drugs. Some items may interact with your medicine. What should I watch for while using this medicine? Your condition will be monitored carefully while you are receiving this medicine. This medicine may increase the side effects of 5-fluorouracil, 5-FU. Tell your doctor or health care  professional if you have diarrhea or mouth sores that do not get better or that get worse. What side effects may I notice from receiving this medicine? Side effects that you should report to your doctor or health care professional as soon as possible: -allergic reactions like skin rash, itching or hives, swelling of the face, lips, or tongue -breathing problems -fever, infection -mouth sores -unusual bleeding or bruising -unusually weak or tired Side effects that usually do not require medical attention (report to your doctor or health care professional if they continue or are bothersome): -constipation or diarrhea -loss of appetite -nausea, vomiting This list may not describe all possible side effects. Call your doctor for medical advice about side effects. You may report side effects to FDA at 1-800-FDA-1088. Where should I keep my medicine? This drug is given in a hospital or clinic and will not be stored at home. NOTE: This sheet is a summary. It may not cover all possible information. If you have questions about this medicine, talk to your doctor, pharmacist, or health care provider.  2018 Else[employer]/Gold Standard (2007-09-08 16:50:29) Oxaliplatin Injection What is this medicine? OXALIPLATIN (ox AL i PLA tin) is a chemotherapy drug. It targets fast dividing cells, like cancer cells, and causes these cells to die. This medicine is used to treat cancers of the colon and rectum, and many other cancers. This medicine may be used for other purposes; ask your health care provider or pharmacist if you have questions. COMMON BRAND NAME(S): Eloxatin What should I tell my health care provider before I take this medicine? They need to know if you have any of these conditions: -kidney  disease -an unusual or allergic reaction to oxaliplatin, other chemotherapy, other medicines, foods, dyes, or preservatives -pregnant or trying to get pregnant -breast-feeding How should I use this medicine? This  drug is given as an infusion into a vein. It is administered in a hospital or clinic by a specially trained health care professional. Talk to your pediatrician regarding the use of this medicine in children. Special care may be needed. Overdosage: If you think you have taken too much of this medicine contact a poison control center or emergency room at once. NOTE: This medicine is only for you. Do not share this medicine with others. What if I miss a dose? It is important not to miss a dose. Call your doctor or health care professional if you are unable to keep an appointment. What may interact with this medicine? -medicines to increase blood counts like filgrastim, pegfilgrastim, sargramostim -probenecid -some antibiotics like amikacin, gentamicin, neomycin, polymyxin B, streptomycin, tobramycin -zalcitabine Talk to your doctor or health care professional before taking any of these medicines: -acetaminophen -aspirin -ibuprofen -ketoprofen -naproxen This list may not describe all possible interactions. Give your health care provider a list of all the medicines, herbs, non-prescription drugs, or dietary supplements you use. Also tell them if you smoke, drink alcohol, or use illegal drugs. Some items may interact with your medicine. What should I watch for while using this medicine? Your condition will be monitored carefully while you are receiving this medicine. You will need important blood work done while you are taking this medicine. This medicine can make you more sensitive to cold. Do not drink cold drinks or use ice. Cover exposed skin before coming in contact with cold temperatures or cold objects. When out in cold weather wear warm clothing and cover your mouth and nose to warm the air that goes into your lungs. Tell your doctor if you get sensitive to the cold. This drug may make you feel generally unwell. This is not uncommon, as chemotherapy can affect healthy cells as well as cancer  cells. Report any side effects. Continue your course of treatment even though you feel ill unless your doctor tells you to stop. In some cases, you may be given additional medicines to help with side effects. Follow all directions for their use. Call your doctor or health care professional for advice if you get a fever, chills or sore throat, or other symptoms of a cold or flu. Do not treat yourself. This drug decreases your body's ability to fight infections. Try to avoid being around people who are sick. This medicine may increase your risk to bruise or bleed. Call your doctor or health care professional if you notice any unusual bleeding. Be careful brushing and flossing your teeth or using a toothpick because you may get an infection or bleed more easily. If you have any dental work done, tell your dentist you are receiving this medicine. Avoid taking products that contain aspirin, acetaminophen, ibuprofen, naproxen, or ketoprofen unless instructed by your doctor. These medicines may hide a fever. Do not become pregnant while taking this medicine. Women should inform their doctor if they wish to become pregnant or think they might be pregnant. There is a potential for serious side effects to an unborn child. Talk to your health care professional or pharmacist for more information. Do not breast-feed an infant while taking this medicine. Call your doctor or health care professional if you get diarrhea. Do not treat yourself. What side effects may I notice from receiving  this medicine? Side effects that you should report to your doctor or health care professional as soon as possible: -allergic reactions like skin rash, itching or hives, swelling of the face, lips, or tongue -low blood counts - This drug may decrease the number of white blood cells, red blood cells and platelets. You may be at increased risk for infections and bleeding. -signs of infection - fever or chills, cough, sore throat, pain or  difficulty passing urine -signs of decreased platelets or bleeding - bruising, pinpoint red spots on the skin, black, tarry stools, nosebleeds -signs of decreased red blood cells - unusually weak or tired, fainting spells, lightheadedness -breathing problems -chest pain, pressure -cough -diarrhea -jaw tightness -mouth sores -nausea and vomiting -pain, swelling, redness or irritation at the injection site -pain, tingling, numbness in the hands or feet -problems with balance, talking, walking -redness, blistering, peeling or loosening of the skin, including inside the mouth -trouble passing urine or change in the amount of urine Side effects that usually do not require medical attention (report to your doctor or health care professional if they continue or are bothersome): -changes in vision -constipation -hair loss -loss of appetite -metallic taste in the mouth or changes in taste -stomach pain This list may not describe all possible side effects. Call your doctor for medical advice about side effects. You may report side effects to FDA at 1-800-FDA-1088. Where should I keep my medicine? This drug is given in a hospital or clinic and will not be stored at home. NOTE: This sheet is a summary. It may not cover all possible information. If you have questions about this medicine, talk to your doctor, pharmacist, or health care provider.  2018 Elsevier/Gold Standard (2007-09-29 17:22:47) Trastuzumab injection for infusion What is this medicine? TRASTUZUMAB (tras TOO zoo mab) is a monoclonal antibody. It is used to treat breast cancer and stomach cancer. This medicine may be used for other purposes; ask your health care provider or pharmacist if you have questions. COMMON BRAND NAME(S): Herceptin What should I tell my health care provider before I take this medicine? They need to know if you have any of these conditions: -heart disease -heart failure -lung or breathing disease, like  asthma -an unusual or allergic reaction to trastuzumab, benzyl alcohol, or other medications, foods, dyes, or preservatives -pregnant or trying to get pregnant -breast-feeding How should I use this medicine? This drug is given as an infusion into a vein. It is administered in a hospital or clinic by a specially trained health care professional. Talk to your pediatrician regarding the use of this medicine in children. This medicine is not approved for use in children. Overdosage: If you think you have taken too much of this medicine contact a poison control center or emergency room at once. NOTE: This medicine is only for you. Do not share this medicine with others. What if I miss a dose? It is important not to miss a dose. Call your doctor or health care professional if you are unable to keep an appointment. What may interact with this medicine? This medicine may interact with the following medications: -certain types of chemotherapy, such as daunorubicin, doxorubicin, epirubicin, and idarubicin This list may not describe all possible interactions. Give your health care provider a list of all the medicines, herbs, non-prescription drugs, or dietary supplements you use. Also tell them if you smoke, drink alcohol, or use illegal drugs. Some items may interact with your medicine. What should I watch for while using this  medicine? Visit your doctor for checks on your progress. Report any side effects. Continue your course of treatment even though you feel ill unless your doctor tells you to stop. Call your doctor or health care professional for advice if you get a fever, chills or sore throat, or other symptoms of a cold or flu. Do not treat yourself. Try to avoid being around people who are sick. You may experience fever, chills and shaking during your first infusion. These effects are usually mild and can be treated with other medicines. Report any side effects during the infusion to your health care  professional. Fever and chills usually do not happen with later infusions. Do not become pregnant while taking this medicine or for 7 months after stopping it. Women should inform their doctor if they wish to become pregnant or think they might be pregnant. Women of child-bearing potential will need to have a negative pregnancy test before starting this medicine. There is a potential for serious side effects to an unborn child. Talk to your health care professional or pharmacist for more information. Do not breast-feed an infant while taking this medicine or for 7 months after stopping it. Women must use effective birth control with this medicine. What side effects may I notice from receiving this medicine? Side effects that you should report to your doctor or health care professional as soon as possible: -allergic reactions like skin rash, itching or hives, swelling of the face, lips, or tongue -chest pain or palpitations -cough -dizziness -feeling faint or lightheaded, falls -fever -general ill feeling or flu-like symptoms -signs of worsening heart failure like breathing problems; swelling in your legs and feet -unusually weak or tired Side effects that usually do not require medical attention (report to your doctor or health care professional if they continue or are bothersome): -bone pain -changes in taste -diarrhea -joint pain -nausea/vomiting -weight loss This list may not describe all possible side effects. Call your doctor for medical advice about side effects. You may report side effects to FDA at 1-800-FDA-1088. Where should I keep my medicine? This drug is given in a hospital or clinic and will not be stored at home. NOTE: This sheet is a summary. It may not cover all possible information. If you have questions about this medicine, talk to your doctor, pharmacist, or health care provider.  2018 Else[employer]/Gold Standard (2016-02-27 14:37:52) Fluorouracil, 5-FU injection What is  this medicine? FLUOROURACIL, 5-FU (flure oh YOOR a sil) is a chemotherapy drug. It slows the growth of cancer cells. This medicine is used to treat many types of cancer like breast cancer, colon or rectal cancer, pancreatic cancer, and stomach cancer. This medicine may be used for other purposes; ask your health care provider or pharmacist if you have questions. COMMON BRAND NAME(S): Adrucil What should I tell my health care provider before I take this medicine? They need to know if you have any of these conditions: -blood disorders -dihydropyrimidine dehydrogenase (DPD) deficiency -infection (especially a virus infection such as chickenpox, cold sores, or herpes) -kidney disease -liver disease -malnourished, poor nutrition -recent or ongoing radiation therapy -an unusual or allergic reaction to fluorouracil, other chemotherapy, other medicines, foods, dyes, or preservatives -pregnant or trying to get pregnant -breast-feeding How should I use this medicine? This drug is given as an infusion or injection into a vein. It is administered in a hospital or clinic by a specially trained health care professional. Talk to your pediatrician regarding the use of this medicine in children. Special  care may be needed. Overdosage: If you think you have taken too much of this medicine contact a poison control center or emergency room at once. NOTE: This medicine is only for you. Do not share this medicine with others. What if I miss a dose? It is important not to miss your dose. Call your doctor or health care professional if you are unable to keep an appointment. What may interact with this medicine? -allopurinol -cimetidine -dapsone -digoxin -hydroxyurea -leucovorin -levamisole -medicines for seizures like ethotoin, fosphenytoin, phenytoin -medicines to increase blood counts like filgrastim, pegfilgrastim, sargramostim -medicines that treat or prevent blood clots like warfarin, enoxaparin, and  dalteparin -methotrexate -metronidazole -pyrimethamine -some other chemotherapy drugs like busulfan, cisplatin, estramustine, vinblastine -trimethoprim -trimetrexate -vaccines Talk to your doctor or health care professional before taking any of these medicines: -acetaminophen -aspirin -ibuprofen -ketoprofen -naproxen This list may not describe all possible interactions. Give your health care provider a list of all the medicines, herbs, non-prescription drugs, or dietary supplements you use. Also tell them if you smoke, drink alcohol, or use illegal drugs. Some items may interact with your medicine. What should I watch for while using this medicine? Visit your doctor for checks on your progress. This drug may make you feel generally unwell. This is not uncommon, as chemotherapy can affect healthy cells as well as cancer cells. Report any side effects. Continue your course of treatment even though you feel ill unless your doctor tells you to stop. In some cases, you may be given additional medicines to help with side effects. Follow all directions for their use. Call your doctor or health care professional for advice if you get a fever, chills or sore throat, or other symptoms of a cold or flu. Do not treat yourself. This drug decreases your body's ability to fight infections. Try to avoid being around people who are sick. This medicine may increase your risk to bruise or bleed. Call your doctor or health care professional if you notice any unusual bleeding. Be careful brushing and flossing your teeth or using a toothpick because you may get an infection or bleed more easily. If you have any dental work done, tell your dentist you are receiving this medicine. Avoid taking products that contain aspirin, acetaminophen, ibuprofen, naproxen, or ketoprofen unless instructed by your doctor. These medicines may hide a fever. Do not become pregnant while taking this medicine. Women should inform their  doctor if they wish to become pregnant or think they might be pregnant. There is a potential for serious side effects to an unborn child. Talk to your health care professional or pharmacist for more information. Do not breast-feed an infant while taking this medicine. Men should inform their doctor if they wish to father a child. This medicine may lower sperm counts. Do not treat diarrhea with over the counter products. Contact your doctor if you have diarrhea that lasts more than 2 days or if it is severe and watery. This medicine can make you more sensitive to the sun. Keep out of the sun. If you cannot avoid being in the sun, wear protective clothing and use sunscreen. Do not use sun lamps or tanning beds/booths. What side effects may I notice from receiving this medicine? Side effects that you should report to your doctor or health care professional as soon as possible: -allergic reactions like skin rash, itching or hives, swelling of the face, lips, or tongue -low blood counts - this medicine may decrease the number of white blood cells, red  blood cells and platelets. You may be at increased risk for infections and bleeding. -signs of infection - fever or chills, cough, sore throat, pain or difficulty passing urine -signs of decreased platelets or bleeding - bruising, pinpoint red spots on the skin, black, tarry stools, blood in the urine -signs of decreased red blood cells - unusually weak or tired, fainting spells, lightheadedness -breathing problems -changes in vision -chest pain -mouth sores -nausea and vomiting -pain, swelling, redness at site where injected -pain, tingling, numbness in the hands or feet -redness, swelling, or sores on hands or feet -stomach pain -unusual bleeding Side effects that usually do not require medical attention (report to your doctor or health care professional if they continue or are bothersome): -changes in finger or toe nails -diarrhea -dry or itchy  skin -hair loss -headache -loss of appetite -sensitivity of eyes to the light -stomach upset -unusually teary eyes This list may not describe all possible side effects. Call your doctor for medical advice about side effects. You may report side effects to FDA at 1-800-FDA-1088. Where should I keep my medicine? This drug is given in a hospital or clinic and will not be stored at home. NOTE: This sheet is a summary. It may not cover all possible information. If you have questions about this medicine, talk to your doctor, pharmacist, or health care provider.  2018 [employer]/Gold Standard (2007-07-08 13:53:16) Dexamethasone injection What is this medicine? DEXAMETHASONE (dex a METH a sone) is a corticosteroid. It is used to treat inflammation of the skin, joints, lungs, and other organs. Common conditions treated include asthma, allergies, and arthritis. It is also used for other conditions, like blood disorders and diseases of the adrenal glands. This medicine may be used for other purposes; ask your health care provider or pharmacist if you have questions. COMMON BRAND NAME(S): Decadron, DoubleDex, Simplist Dexamethasone, Solurex What should I tell my health care provider before I take this medicine? They need to know if you have any of these conditions: -blood clotting problems -Cushing's syndrome -diabetes -glaucoma -heart problems or disease -high blood pressure -infection like herpes, measles, tuberculosis, or chickenpox -kidney disease -liver disease -mental problems -myasthenia gravis -osteoporosis -previous heart attack -seizures -stomach, ulcer or intestine disease including colitis and diverticulitis -thyroid problem -an unusual or allergic reaction to dexamethasone, corticosteroids, other medicines, lactose, foods, dyes, or preservatives -pregnant or trying to get pregnant -breast-feeding How should I use this medicine? This medicine is for injection into a muscle,  joint, lesion, soft tissue, or vein. It is given by a health care professional in a hospital or clinic setting. Talk to your pediatrician regarding the use of this medicine in children. Special care may be needed. Overdosage: If you think you have taken too much of this medicine contact a poison control center or emergency room at once. NOTE: This medicine is only for you. Do not share this medicine with others. What if I miss a dose? This may not apply. If you are having a series of injections over a prolonged period, try not to miss an appointment. Call your doctor or health care professional to reschedule if you are unable to keep an appointment. What may interact with this medicine? Do not take this medicine with any of the following medications: -mifepristone, RU-486 -vaccines This medicine may also interact with the following medications: -amphotericin B -antibiotics like clarithromycin, erythromycin, and troleandomycin -aspirin and aspirin-like drugs -barbiturates like phenobarbital -carbamazepine -cholestyramine -cholinesterase inhibitors like donepezil, galantamine, rivastigmine, and tacrine -cyclosporine -digoxin -diuretics -  ephedrine -male hormones, like estrogens or progestins and birth control pills -indinavir -isoniazid -ketoconazole -medicines for diabetes -medicines that improve muscle tone or strength for conditions like myasthenia gravis -NSAIDs, medicines for pain and inflammation, like ibuprofen or naproxen -phenytoin -rifampin -thalidomide -warfarin This list may not describe all possible interactions. Give your health care provider a list of all the medicines, herbs, non-prescription drugs, or dietary supplements you use. Also tell them if you smoke, drink alcohol, or use illegal drugs. Some items may interact with your medicine. What should I watch for while using this medicine? Your condition will be monitored carefully while you are receiving this  medicine. If you are taking this medicine for a long time, carry an identification card with your name and address, the type and dose of your medicine, and your doctor's name and address. This medicine may increase your risk of getting an infection. Stay away from people who are sick. Tell your doctor or health care professional if you are around anyone with measles or chickenpox. Talk to your health care provider before you get any vaccines that you take this medicine. If you are going to have surgery, tell your doctor or health care professional that you have taken this medicine within the last twelve months. Ask your doctor or health care professional about your diet. You may need to lower the amount of salt you eat. The medicine can increase your blood sugar. If you are a diabetic check with your doctor if you need help adjusting the dose of your diabetic medicine. What side effects may I notice from receiving this medicine? Side effects that you should report to your doctor or health care professional as soon as possible: -allergic reactions like skin rash, itching or hives, swelling of the face, lips, or tongue -black or tarry stools -change in the amount of urine -changes in vision -confusion, excitement, restlessness, a false sense of well-being -fever, sore throat, sneezing, cough, or other signs of infection, wounds that will not heal -hallucinations -increased thirst -mental depression, mood swings, mistaken feelings of self importance or of being mistreated -pain in hips, back, ribs, arms, shoulders, or legs -pain, redness, or irritation at the injection site -redness, blistering, peeling or loosening of the skin, including inside the mouth -rounding out of face -swelling of feet or lower legs -unusual bleeding or bruising -unusual tired or weak -wounds that do not heal Side effects that usually do not require medical attention (report to your doctor or health care professional  if they continue or are bothersome): -diarrhea or constipation -change in taste -headache -nausea, vomiting -skin problems, acne, thin and shiny skin -touble sleeping -unusual growth of hair on the face or body -weight gain This list may not describe all possible side effects. Call your doctor for medical advice about side effects. You may report side effects to FDA at 1-800-FDA-1088. Where should I keep my medicine? This drug is given in a hospital or clinic and will not be stored at home. NOTE: This sheet is a summary. It may not cover all possible information. If you have questions about this medicine, talk to your doctor, pharmacist, or health care provider.  2018 Elsevier/Gold Standard (2007-06-25 14:04:12) Palonosetron Injection What is this medicine? PALONOSETRON (pal oh NOE se tron) is used to prevent nausea and vomiting caused by chemotherapy. It also helps prevent delayed nausea and vomiting that may occur a few days after your treatment. This medicine may be used for other purposes; ask your health care provider  or pharmacist if you have questions. COMMON BRAND NAME(S): Aloxi What should I tell my health care provider before I take this medicine? They need to know if you have any of these conditions: -an unusual or allergic reaction to palonosetron, dolasetron, granisetron, ondansetron, other medicines, foods, dyes, or preservatives -pregnant or trying to get pregnant -breast-feeding How should I use this medicine? This medicine is for infusion into a vein. It is given by a health care professional in a hospital or clinic setting. Talk to your pediatrician regarding the use of this medicine in children. While this drug may be prescribed for children as young as 1 month for selected conditions, precautions do apply. Overdosage: If you think you have taken too much of this medicine contact a poison control center or emergency room at once. NOTE: This medicine is only for you. Do  not share this medicine with others. What if I miss a dose? This does not apply. What may interact with this medicine? -certain medicines for depression, anxiety, or psychotic disturbances -fentanyl -linezolid -MAOIs like Carbex, Eldepryl, Marplan, Nardil, and Parnate -methylene blue (injected into a vein) -tramadol This list may not describe all possible interactions. Give your health care provider a list of all the medicines, herbs, non-prescription drugs, or dietary supplements you use. Also tell them if you smoke, drink alcohol, or use illegal drugs. Some items may interact with your medicine. What should I watch for while using this medicine? Your condition will be monitored carefully while you are receiving this medicine. What side effects may I notice from receiving this medicine? Side effects that you should report to your doctor or health care professional as soon as possible: -allergic reactions like skin rash, itching or hives, swelling of the face, lips, or tongue -breathing problems -confusion -dizziness -fast, irregular heartbeat -fever and chills -loss of balance or coordination -seizures -sweating -swelling of the hands and feet -tremors -unusually weak or tired Side effects that usually do not require medical attention (report to your doctor or health care professional if they continue or are bothersome): -constipation or diarrhea -headache This list may not describe all possible side effects. Call your doctor for medical advice about side effects. You may report side effects to FDA at 1-800-FDA-1088. Where should I keep my medicine? This drug is given in a hospital or clinic and will not be stored at home. NOTE: This sheet is a summary. It may not cover all possible information. If you have questions about this medicine, talk to your doctor, pharmacist, or health care provider.  2018 Elsevier/Gold Standard (2013-01-08 10:38:36) Diphenhydramine capsules or  tablets What is this medicine? DIPHENHYDRAMINE (dye fen HYE dra meen) is an antihistamine. It is used to treat the symptoms of an allergic reaction. It is also used to treat Parkinson's disease. This medicine is also used to prevent and to treat motion sickness and as a nighttime sleep aid. This medicine may be used for other purposes; ask your health care provider or pharmacist if you have questions. COMMON BRAND NAME(S): Alka-Seltzer Plus Allergy, Aller-G-Time, Banophen, Benadryl Allergy, Benadryl Allergy Dye Free, Benadryl Allergy Kapgel, Benadryl Allergy Ultratab, Diphedryl, Diphenhist, Genahist, PHARBEDRYL, Q-Dryl, Gretta Began, Valu-Dryl, Vicks ZzzQuil Nightime Sleep-Aid What should I tell my health care provider before I take this medicine? They need to know if you have any of these conditions: -asthma or lung disease -glaucoma -high blood pressure or heart disease -liver disease -pain or difficulty passing urine -prostate trouble -ulcers or other stomach problems -an unusual or allergic  reaction to diphenhydramine, other medicines foods, dyes, or preservatives such as sulfites -pregnant or trying to get pregnant -breast-feeding How should I use this medicine? Take this medicine by mouth with a full glass of water. Follow the directions on the prescription label. Take your doses at regular intervals. Do not take your medicine more often than directed. To prevent motion sickness start taking this medicine 30 to 60 minutes before you leave. Talk to your pediatrician regarding the use of this medicine in children. Special care may be needed. Patients over 66 years old may have a stronger reaction and need a smaller dose. Overdosage: If you think you have taken too much of this medicine contact a poison control center or emergency room at once. NOTE: This medicine is only for you. Do not share this medicine with others. What if I miss a dose? If you miss a dose, take it as soon as you can. If  it is almost time for your next dose, take only that dose. Do not take double or extra doses. What may interact with this medicine? Do not take this medicine with any of the following medications: -MAOIs like Carbex, Eldepryl, Marplan, Nardil, and Parnate This medicine may also interact with the following medications: -alcohol -barbiturates, like phenobarbital -medicines for bladder spasm like oxybutynin, tolterodine -medicines for blood pressure -medicines for depression, anxiety, or psychotic disturbances -medicines for movement abnormalities or Parkinson's disease -medicines for sleep -other medicines for cold, cough or allergy -some medicines for the stomach like chlordiazepoxide, dicyclomine This list may not describe all possible interactions. Give your health care provider a list of all the medicines, herbs, non-prescription drugs, or dietary supplements you use. Also tell them if you smoke, drink alcohol, or use illegal drugs. Some items may interact with your medicine. What should I watch for while using this medicine? Visit your doctor or health care professional for regular check ups. Tell your doctor if your symptoms do not improve or if they get worse. Your mouth may get dry. Chewing sugarless gum or sucking hard candy, and drinking plenty of water may help. Contact your doctor if the problem does not go away or is severe. This medicine may cause dry eyes and blurred vision. If you wear contact lenses you may feel some discomfort. Lubricating drops may help. See your eye doctor if the problem does not go away or is severe. You may get drowsy or dizzy. Do not drive, use machinery, or do anything that needs mental alertness until you know how this medicine affects you. Do not stand or sit up quickly, especially if you are an older patient. This reduces the risk of dizzy or fainting spells. Alcohol may interfere with the effect of this medicine. Avoid alcoholic drinks. What side effects  may I notice from receiving this medicine? Side effects that you should report to your doctor or health care professional as soon as possible: -allergic reactions like skin rash, itching or hives, swelling of the face, lips, or tongue -changes in vision -confused, agitated, nervous -irregular or fast heartbeat -tremor -trouble passing urine -unusual bleeding or bruising -unusually weak or tired Side effects that usually do not require medical attention (report to your doctor or health care professional if they continue or are bothersome): -constipation, diarrhea -drowsy -headache -loss of appetite -stomach upset, vomiting -thick mucous This list may not describe all possible side effects. Call your doctor for medical advice about side effects. You may report side effects to FDA at 1-800-FDA-1088. Where should  I keep my medicine? Keep out of the reach of children. Store at room temperature between 15 and 30 degrees C (59 and 86 degrees F). Keep container closed tightly. Throw away any unused medicine after the expiration date. NOTE: This sheet is a summary. It may not cover all possible information. If you have questions about this medicine, talk to your doctor, pharmacist, or health care provider.  2018 Elsevier/Gold Standard (2007-06-22 17:06:22) Acetaminophen tablets or caplets What is this medicine? ACETAMINOPHEN (a set a MEE noe fen) is a pain reliever. It is used to treat mild pain and fever. This medicine may be used for other purposes; ask your health care provider or pharmacist if you have questions. COMMON BRAND NAME(S): Aceta, Actamin, Anacin Aspirin Free, Genapap, Genebs, Mapap, Pain & Fever, Pain and Fever, PAIN RELIEF, PAIN RELIEF Extra Strength, Pain Reliever, Panadol, PHARBETOL, Q-Pap, Q-Pap Extra Strength, Tylenol, Tylenol CrushableTablet, Tylenol Extra Strength, XS No Aspirin, XS Pain Reliever What should I tell my health care provider before I take this medicine? They  need to know if you have any of these conditions: -if you often drink alcohol -liver disease -an unusual or allergic reaction to acetaminophen, other medicines, foods, dyes, or preservatives -pregnant or trying to get pregnant -breast-feeding How should I use this medicine? Take this medicine by mouth with a glass of water. Follow the directions on the package or prescription label. Take your medicine at regular intervals. Do not take your medicine more often than directed. Talk to your pediatrician regarding the use of this medicine in children. While this drug may be prescribed for children as young as 80 years of age for selected conditions, precautions do apply. Overdosage: If you think you have taken too much of this medicine contact a poison control center or emergency room at once. NOTE: This medicine is only for you. Do not share this medicine with others. What if I miss a dose? If you miss a dose, take it as soon as you can. If it is almost time for your next dose, take only that dose. Do not take double or extra doses. What may interact with this medicine? -alcohol -imatinib -isoniazid -other medicines with acetaminophen This list may not describe all possible interactions. Give your health care provider a list of all the medicines, herbs, non-prescription drugs, or dietary supplements you use. Also tell them if you smoke, drink alcohol, or use illegal drugs. Some items may interact with your medicine. What should I watch for while using this medicine? Tell your doctor or health care professional if the pain lasts more than 10 days (5 days for children), if it gets worse, or if there is a new or different kind of pain. Also, check with your doctor if a fever lasts for more than 3 days. Do not take other medicines that contain acetaminophen with this medicine. Always read labels carefully. If you have questions, ask your doctor or pharmacist. If you take too much acetaminophen get  medical help right away. Too much acetaminophen can be very dangerous and cause liver damage. Even if you do not have symptoms, it is important to get help right away. What side effects may I notice from receiving this medicine? Side effects that you should report to your doctor or health care professional as soon as possible: -allergic reactions like skin rash, itching or hives, swelling of the face, lips, or tongue -breathing problems -fever or sore throat -redness, blistering, peeling or loosening of the skin, including inside the  mouth -trouble passing urine or change in the amount of urine -unusual bleeding or bruising -unusually weak or tired -yellowing of the eyes or skin Side effects that usually do not require medical attention (report to your doctor or health care professional if they continue or are bothersome): -headache -nausea, stomach upset This list may not describe all possible side effects. Call your doctor for medical advice about side effects. You may report side effects to FDA at 1-800-FDA-1088. Where should I keep my medicine? Keep out of reach of children. Store at room temperature between 20 and 25 degrees C (68 and 77 degrees F). Protect from moisture and heat. Throw away any unused medicine after the expiration date. NOTE: This sheet is a summary. It may not cover all possible information. If you have questions about this medicine, talk to your doctor, pharmacist, or health care provider.  2018 Elsevier/Gold Standard (2012-10-26 12:54:16)

## 2017-12-17 NOTE — Patient Instructions (Signed)
Implanted Port Home Guide An implanted port is a type of central line that is placed under the skin. Central lines are used to provide IV access when treatment or nutrition needs to be given through a person's veins. Implanted ports are used for long-term IV access. An implanted port may be placed because:  You need IV medicine that would be irritating to the small veins in your hands or arms.  You need long-term IV medicines, such as antibiotics.  You need IV nutrition for a long period.  You need frequent blood draws for lab tests.  You need dialysis.  Implanted ports are usually placed in the chest area, but they can also be placed in the upper arm, the abdomen, or the leg. An implanted port has two main parts:  Reservoir. The reservoir is round and will appear as a small, raised area under your skin. The reservoir is the part where a needle is inserted to give medicines or draw blood.  Catheter. The catheter is a thin, flexible tube that extends from the reservoir. The catheter is placed into a large vein. Medicine that is inserted into the reservoir goes into the catheter and then into the vein.  How will I care for my incision site? Do not get the incision site wet. Bathe or shower as directed by your health care provider. How is my port accessed? Special steps must be taken to access the port:  Before the port is accessed, a numbing cream can be placed on the skin. This helps numb the skin over the port site.  Your health care provider uses a sterile technique to access the port. ? Your health care provider must put on a mask and sterile gloves. ? The skin over your port is cleaned carefully with an antiseptic and allowed to dry. ? The port is gently pinched between sterile gloves, and a needle is inserted into the port.  Only "non-coring" port needles should be used to access the port. Once the port is accessed, a blood return should be checked. This helps ensure that the port  is in the vein and is not clogged.  If your port needs to remain accessed for a constant infusion, a clear (transparent) bandage will be placed over the needle site. The bandage and needle will need to be changed every week, or as directed by your health care provider.  Keep the bandage covering the needle clean and dry. Do not get it wet. Follow your health care provider's instructions on how to take a shower or bath while the port is accessed.  If your port does not need to stay accessed, no bandage is needed over the port.  What is flushing? Flushing helps keep the port from getting clogged. Follow your health care provider's instructions on how and when to flush the port. Ports are usually flushed with saline solution or a medicine called heparin. The need for flushing will depend on how the port is used.  If the port is used for intermittent medicines or blood draws, the port will need to be flushed: ? After medicines have been given. ? After blood has been drawn. ? As part of routine maintenance.  If a constant infusion is running, the port may not need to be flushed.  How long will my port stay implanted? The port can stay in for as long as your health care provider thinks it is needed. When it is time for the port to come out, surgery will be   done to remove it. The procedure is similar to the one performed when the port was put in. When should I seek immediate medical care? When you have an implanted port, you should seek immediate medical care if:  You notice a bad smell coming from the incision site.  You have swelling, redness, or drainage at the incision site.  You have more swelling or pain at the port site or the surrounding area.  You have a fever that is not controlled with medicine.  This information is not intended to replace advice given to you by your health care provider. Make sure you discuss any questions you have with your health care provider. Document  Released: 03/04/2005 Document Revised: 08/10/2015 Document Reviewed: 11/09/2012 Elsevier Interactive Patient Education  2017 [employer]  

## 2017-12-19 ENCOUNTER — Telehealth: Payer: Self-pay | Admitting: Oncology

## 2017-12-19 ENCOUNTER — Other Ambulatory Visit: Payer: Self-pay

## 2017-12-19 ENCOUNTER — Inpatient Hospital Stay: Payer: 59

## 2017-12-19 VITALS — BP 128/86 | HR 75 | Temp 97.8°F | Resp 18

## 2017-12-19 DIAGNOSIS — C160 Malignant neoplasm of cardia: Secondary | ICD-10-CM | POA: Diagnosis not present

## 2017-12-19 DIAGNOSIS — C799 Secondary malignant neoplasm of unspecified site: Principal | ICD-10-CM

## 2017-12-19 MED ORDER — SODIUM CHLORIDE 0.9% FLUSH
10.0000 mL | INTRAVENOUS | Status: DC | PRN
  Administered 2017-12-19: 10 mL
  Filled 2017-12-19: qty 10

## 2017-12-19 MED ORDER — HEPARIN SOD (PORK) LOCK FLUSH 100 UNIT/ML IV SOLN
500.0000 [IU] | Freq: Once | INTRAVENOUS | Status: AC | PRN
  Administered 2017-12-19: 500 [IU]
  Filled 2017-12-19: qty 5

## 2017-12-19 NOTE — Telephone Encounter (Signed)
Patient received Herceptin/FOLFOX on 12/17/2017. Patient returns for pump DC today. Patient states he has no complaints at this time. Patient reports no side effects from Chemotherapy administered on 12/17/2017. Patient instructed if he has any questions or complaints to feel free to contact our office. Patient and spouse verbalized understanding.

## 2017-12-19 NOTE — Patient Instructions (Signed)
Implanted Port Home Guide An implanted port is a type of central line that is placed under the skin. Central lines are used to provide IV access when treatment or nutrition needs to be given through a person's veins. Implanted ports are used for long-term IV access. An implanted port may be placed because:  You need IV medicine that would be irritating to the small veins in your hands or arms.  You need long-term IV medicines, such as antibiotics.  You need IV nutrition for a long period.  You need frequent blood draws for lab tests.  You need dialysis.  Implanted ports are usually placed in the chest area, but they can also be placed in the upper arm, the abdomen, or the leg. An implanted port has two main parts:  Reservoir. The reservoir is round and will appear as a small, raised area under your skin. The reservoir is the part where a needle is inserted to give medicines or draw blood.  Catheter. The catheter is a thin, flexible tube that extends from the reservoir. The catheter is placed into a large vein. Medicine that is inserted into the reservoir goes into the catheter and then into the vein.  How will I care for my incision site? Do not get the incision site wet. Bathe or shower as directed by your health care provider. How is my port accessed? Special steps must be taken to access the port:  Before the port is accessed, a numbing cream can be placed on the skin. This helps numb the skin over the port site.  Your health care provider uses a sterile technique to access the port. ? Your health care provider must put on a mask and sterile gloves. ? The skin over your port is cleaned carefully with an antiseptic and allowed to dry. ? The port is gently pinched between sterile gloves, and a needle is inserted into the port.  Only "non-coring" port needles should be used to access the port. Once the port is accessed, a blood return should be checked. This helps ensure that the port  is in the vein and is not clogged.  If your port needs to remain accessed for a constant infusion, a clear (transparent) bandage will be placed over the needle site. The bandage and needle will need to be changed every week, or as directed by your health care provider.  Keep the bandage covering the needle clean and dry. Do not get it wet. Follow your health care provider's instructions on how to take a shower or bath while the port is accessed.  If your port does not need to stay accessed, no bandage is needed over the port.  What is flushing? Flushing helps keep the port from getting clogged. Follow your health care provider's instructions on how and when to flush the port. Ports are usually flushed with saline solution or a medicine called heparin. The need for flushing will depend on how the port is used.  If the port is used for intermittent medicines or blood draws, the port will need to be flushed: ? After medicines have been given. ? After blood has been drawn. ? As part of routine maintenance.  If a constant infusion is running, the port may not need to be flushed.  How long will my port stay implanted? The port can stay in for as long as your health care provider thinks it is needed. When it is time for the port to come out, surgery will be   done to remove it. The procedure is similar to the one performed when the port was put in. When should I seek immediate medical care? When you have an implanted port, you should seek immediate medical care if:  You notice a bad smell coming from the incision site.  You have swelling, redness, or drainage at the incision site.  You have more swelling or pain at the port site or the surrounding area.  You have a fever that is not controlled with medicine.  This information is not intended to replace advice given to you by your health care provider. Make sure you discuss any questions you have with your health care provider. Document  Released: 03/04/2005 Document Revised: 08/10/2015 Document Reviewed: 11/09/2012 Elsevier Interactive Patient Education  2017 [employer]  

## 2017-12-24 ENCOUNTER — Encounter: Payer: Self-pay | Admitting: Hematology & Oncology

## 2017-12-30 ENCOUNTER — Inpatient Hospital Stay: Payer: 59

## 2017-12-30 ENCOUNTER — Encounter: Payer: Self-pay | Admitting: Hematology & Oncology

## 2017-12-30 ENCOUNTER — Inpatient Hospital Stay (HOSPITAL_BASED_OUTPATIENT_CLINIC_OR_DEPARTMENT_OTHER): Payer: 59 | Admitting: Hematology & Oncology

## 2017-12-30 ENCOUNTER — Other Ambulatory Visit: Payer: Self-pay

## 2017-12-30 VITALS — BP 118/78 | HR 83 | Temp 99.1°F | Resp 16 | Wt 184.4 lb

## 2017-12-30 DIAGNOSIS — C160 Malignant neoplasm of cardia: Secondary | ICD-10-CM | POA: Diagnosis not present

## 2017-12-30 DIAGNOSIS — Z79899 Other long term (current) drug therapy: Secondary | ICD-10-CM

## 2017-12-30 DIAGNOSIS — K921 Melena: Secondary | ICD-10-CM

## 2017-12-30 DIAGNOSIS — K5900 Constipation, unspecified: Secondary | ICD-10-CM | POA: Diagnosis not present

## 2017-12-30 DIAGNOSIS — D500 Iron deficiency anemia secondary to blood loss (chronic): Secondary | ICD-10-CM

## 2017-12-30 DIAGNOSIS — C799 Secondary malignant neoplasm of unspecified site: Principal | ICD-10-CM

## 2017-12-30 DIAGNOSIS — Z5111 Encounter for antineoplastic chemotherapy: Secondary | ICD-10-CM

## 2017-12-30 LAB — CBC WITH DIFFERENTIAL (CANCER CENTER ONLY)
ABS IMMATURE GRANULOCYTES: 0.01 10*3/uL (ref 0.00–0.07)
BASOS ABS: 0 10*3/uL (ref 0.0–0.1)
BASOS PCT: 0 %
EOS ABS: 0.1 10*3/uL (ref 0.0–0.5)
Eosinophils Relative: 2 %
HCT: 33.5 % — ABNORMAL LOW (ref 39.0–52.0)
Hemoglobin: 10.5 g/dL — ABNORMAL LOW (ref 13.0–17.0)
IMMATURE GRANULOCYTES: 0 %
LYMPHS ABS: 0.8 10*3/uL (ref 0.7–4.0)
Lymphocytes Relative: 34 %
MCH: 27.5 pg (ref 26.0–34.0)
MCHC: 31.3 g/dL (ref 30.0–36.0)
MCV: 87.7 fL (ref 80.0–100.0)
MONOS PCT: 12 %
Monocytes Absolute: 0.3 10*3/uL (ref 0.1–1.0)
NEUTROS ABS: 1.3 10*3/uL — AB (ref 1.7–7.7)
NEUTROS PCT: 52 %
NRBC: 0 % (ref 0.0–0.2)
Platelet Count: 187 10*3/uL (ref 150–400)
RBC: 3.82 MIL/uL — ABNORMAL LOW (ref 4.22–5.81)
RDW: 14.6 % (ref 11.5–15.5)
WBC Count: 2.4 10*3/uL — ABNORMAL LOW (ref 4.0–10.5)

## 2017-12-30 LAB — CMP (CANCER CENTER ONLY)
ALBUMIN: 2.9 g/dL — AB (ref 3.5–5.0)
ALT: 64 U/L — AB (ref 10–47)
AST: 37 U/L (ref 11–38)
Alkaline Phosphatase: 218 U/L — ABNORMAL HIGH (ref 26–84)
Anion gap: 3 — ABNORMAL LOW (ref 5–15)
BUN: 15 mg/dL (ref 7–22)
CHLORIDE: 110 mmol/L — AB (ref 98–108)
CO2: 28 mmol/L (ref 18–33)
Calcium: 8.6 mg/dL (ref 8.0–10.3)
Creatinine: 0.9 mg/dL (ref 0.60–1.20)
GLUCOSE: 128 mg/dL — AB (ref 73–118)
Potassium: 4 mmol/L (ref 3.3–4.7)
Sodium: 141 mmol/L (ref 128–145)
Total Bilirubin: 0.4 mg/dL (ref 0.2–1.6)
Total Protein: 6 g/dL — ABNORMAL LOW (ref 6.4–8.1)

## 2017-12-30 LAB — IRON AND TIBC
Iron: 27 ug/dL — ABNORMAL LOW (ref 42–163)
Saturation Ratios: 9 % — ABNORMAL LOW (ref 42–163)
TIBC: 298 ug/dL (ref 202–409)
UIBC: 270 ug/dL

## 2017-12-30 LAB — CEA (IN HOUSE-CHCC): CEA (CHCC-In House): 8.59 ng/mL — ABNORMAL HIGH (ref 0.00–5.00)

## 2017-12-30 LAB — LACTATE DEHYDROGENASE: LDH: 135 U/L (ref 98–192)

## 2017-12-30 LAB — FERRITIN: Ferritin: 147 ng/mL (ref 24–336)

## 2017-12-30 MED ORDER — ALTEPLASE 2 MG IJ SOLR
2.0000 mg | Freq: Once | INTRAMUSCULAR | Status: DC | PRN
  Filled 2017-12-30: qty 2

## 2017-12-30 MED ORDER — SODIUM CHLORIDE 0.9% FLUSH
10.0000 mL | INTRAVENOUS | Status: DC | PRN
  Filled 2017-12-30: qty 10

## 2017-12-30 MED ORDER — FLUOROURACIL CHEMO INJECTION 2.5 GM/50ML
400.0000 mg/m2 | Freq: Once | INTRAVENOUS | Status: AC
  Administered 2017-12-30: 850 mg via INTRAVENOUS
  Filled 2017-12-30: qty 17

## 2017-12-30 MED ORDER — PALONOSETRON HCL INJECTION 0.25 MG/5ML
0.2500 mg | Freq: Once | INTRAVENOUS | Status: AC
  Administered 2017-12-30: 0.25 mg via INTRAVENOUS

## 2017-12-30 MED ORDER — DEXTROSE 5 % IV SOLN
Freq: Once | INTRAVENOUS | Status: AC
  Administered 2017-12-30: 10:00:00 via INTRAVENOUS
  Filled 2017-12-30: qty 250

## 2017-12-30 MED ORDER — ACETAMINOPHEN 325 MG PO TABS
650.0000 mg | ORAL_TABLET | Freq: Once | ORAL | Status: AC
  Administered 2017-12-30: 650 mg via ORAL

## 2017-12-30 MED ORDER — TRASTUZUMAB CHEMO 150 MG IV SOLR
4.0000 mg/kg | Freq: Once | INTRAVENOUS | Status: AC
  Administered 2017-12-30: 357 mg via INTRAVENOUS
  Filled 2017-12-30: qty 17

## 2017-12-30 MED ORDER — DIPHENHYDRAMINE HCL 25 MG PO CAPS
ORAL_CAPSULE | ORAL | Status: AC
  Filled 2017-12-30: qty 2

## 2017-12-30 MED ORDER — HEPARIN SOD (PORK) LOCK FLUSH 100 UNIT/ML IV SOLN
500.0000 [IU] | Freq: Once | INTRAVENOUS | Status: DC | PRN
  Filled 2017-12-30: qty 5

## 2017-12-30 MED ORDER — ACETAMINOPHEN 325 MG PO TABS
ORAL_TABLET | ORAL | Status: AC
  Filled 2017-12-30: qty 2

## 2017-12-30 MED ORDER — DEXTROSE 5 % IV SOLN
Freq: Once | INTRAVENOUS | Status: AC
  Administered 2017-12-30: 11:00:00 via INTRAVENOUS
  Filled 2017-12-30: qty 250

## 2017-12-30 MED ORDER — DIPHENHYDRAMINE HCL 25 MG PO CAPS
50.0000 mg | ORAL_CAPSULE | Freq: Once | ORAL | Status: AC
  Administered 2017-12-30: 50 mg via ORAL

## 2017-12-30 MED ORDER — DEXAMETHASONE SODIUM PHOSPHATE 10 MG/ML IJ SOLN
INTRAMUSCULAR | Status: AC
  Filled 2017-12-30: qty 1

## 2017-12-30 MED ORDER — SODIUM CHLORIDE 0.9 % IV SOLN
Freq: Once | INTRAVENOUS | Status: AC
  Administered 2017-12-30: 10:00:00 via INTRAVENOUS
  Filled 2017-12-30: qty 250

## 2017-12-30 MED ORDER — PALONOSETRON HCL INJECTION 0.25 MG/5ML
INTRAVENOUS | Status: AC
  Filled 2017-12-30: qty 5

## 2017-12-30 MED ORDER — OXALIPLATIN CHEMO INJECTION 100 MG/20ML
85.0000 mg/m2 | Freq: Once | INTRAVENOUS | Status: AC
  Administered 2017-12-30: 175 mg via INTRAVENOUS
  Filled 2017-12-30: qty 35

## 2017-12-30 MED ORDER — SODIUM CHLORIDE 0.9 % IV SOLN
2400.0000 mg/m2 | INTRAVENOUS | Status: DC
  Administered 2017-12-30: 5000 mg via INTRAVENOUS
  Filled 2017-12-30: qty 100

## 2017-12-30 MED ORDER — DEXAMETHASONE SODIUM PHOSPHATE 10 MG/ML IJ SOLN
10.0000 mg | Freq: Once | INTRAMUSCULAR | Status: AC
  Administered 2017-12-30: 10 mg via INTRAVENOUS

## 2017-12-30 MED ORDER — LEUCOVORIN CALCIUM INJECTION 350 MG
400.0000 mg/m2 | Freq: Once | INTRAVENOUS | Status: AC
  Administered 2017-12-30: 832 mg via INTRAVENOUS
  Filled 2017-12-30: qty 41.6

## 2017-12-30 NOTE — Progress Notes (Signed)
Hematology and Oncology Follow Up Visit  Matthew Reynolds [id number] 07-20-1965 52 y.o. 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  He tolerated the first cycle of treatment quite well.  He is now eating.  He began to eat about 4-5 days after his treatment.  I have to believe that this is a very good sign for [place] that he is responding.  His weight is still down a little bit.  Hopefully this will continue to improve.  He has had no melena.  Is been no bright red blood per rectum.   There has been no nausea or vomiting.  He has had no mouth sores.  There is been no tingling in the hands or feet.  Overall, I said his performance status is ECOG 1.  Medications:  Current Outpatient Medications:  .  polyethylene glycol (MIRALAX / GLYCOLAX) packet, Take 17 g by mouth 2 (two) times daily., Disp: , Rfl:  .  dexamethasone (DECADRON) 4 MG tablet, Take 2 tablets (8 mg total) by mouth daily. Start the day after chemotherapy for 2 days. Take with food., Disp: 30 tablet, Rfl: 1 .  famotidine (PEPCID) 40 MG tablet, Take 1 tablet (40 mg total) by mouth 2 (two) times daily., Disp: 180 tablet, Rfl: 3 .  ketorolac (TORADOL) 10 MG tablet, Take 1 tablet (10 mg total) by mouth every 6 (six) hours as needed., Disp: 30 tablet, Rfl: 2 .  lidocaine-prilocaine (EMLA) cream, Apply to affected area once, Disp: 30 g, Rfl: 3 .  LORazepam (ATIVAN) 0.5 MG tablet, Take 1 tablet (0.5 mg total) by mouth every 6 (six) hours as needed (Nausea or vomiting)., Disp: 30 tablet, Rfl: 0 .  LORazepam (ATIVAN) 0.5 MG tablet, Take 1 tablet (0.5 mg total) by mouth every 8 (eight) hours., Disp: 30 tablet, Rfl: 0 .  ondansetron (ZOFRAN) 8 MG tablet, Take 1 tablet (8 mg total) by mouth 2 (two) times daily as needed for refractory nausea / vomiting. Start on day 3 after  chemotherapy., Disp: 30 tablet, Rfl: 1 .  prochlorperazine (COMPAZINE) 10 MG tablet, Take 1 tablet (10 mg total) by mouth every 6 (six) hours as needed (Nausea or vomiting)., Disp: 30 tablet, Rfl: 1 .  temazepam (RESTORIL) 15 MG capsule, Take 1 capsule (15 mg total) by mouth at bedtime as needed for sleep (or nausea or vomiting)., Disp: 30 capsule, Rfl: 0  Allergies: No Known Allergies  Past Medical History, Surgical history, Social history, and Family History were reviewed and updated.  Review of Systems: Review of Systems  Constitutional: Negative.   HENT:  Negative.   Eyes: Negative.   Respiratory: Negative.   Cardiovascular: Negative.   Gastrointestinal: Positive for blood in stool and constipation.  Endocrine: Negative.   Genitourinary: Negative.    Musculoskeletal: Negative.   Neurological: Negative.   Hematological: Negative.   Psychiatric/Behavioral: Negative.     Physical Exam:  weight is 184 lb 6 oz (83.6 kg). His oral temperature is 99.1 F (37.3 C). His blood pressure is 118/78 and his pulse is 83. His respiration is 16 and oxygen saturation is 100%.   Wt Readings from Last 3 Encounters:  12/30/17 184 lb 6 oz (83.6 kg)  12/15/17 195 lb (88.5 kg)  12/11/17 195 lb (88.5 kg)    Physical Exam  Constitutional: He is oriented to  person, place, and time.  Well-developed and well-nourished white male.  There is no adenopathy in the neck.  His lungs are clear.  Oral exam shows no mucositis.  Extremities shows no clubbing, cyanosis or edema.  HENT:  Head: Normocephalic and atraumatic.  Mouth/Throat: Oropharynx is clear and moist.  Eyes: Pupils are equal, round, and reactive to light. EOM are normal.  Neck: Normal range of motion.  Cardiovascular: Normal rate, regular rhythm and normal heart sounds.  Pulmonary/Chest: Effort normal and breath sounds normal.  Abdominal: Soft. Bowel sounds are normal.  Abdomen is soft.  He has decent bowel sounds.  There is no fluid wave.   There is no obvious hepato-splenomegaly.  Musculoskeletal: Normal range of motion. He exhibits no edema, tenderness or deformity.  Lymphadenopathy:    He has no cervical adenopathy.  Neurological: He is alert and oriented to person, place, and time.  Skin: Skin is warm and dry. No rash noted. No erythema.  Psychiatric: He has a normal mood and affect. His behavior is normal. Judgment and thought content normal.  Vitals reviewed.    Lab Results  Component Value Date   WBC 2.4 (L) 12/30/2017   HGB 10.5 (L) 12/30/2017   HCT 33.5 (L) 12/30/2017   MCV 87.7 12/30/2017   PLT 187 12/30/2017     Chemistry      Component Value Date/Time   NA 141 12/30/2017 0823   K 4.0 12/30/2017 0823   CL 110 (H) 12/30/2017 0823   CO2 28 12/30/2017 0823   BUN 15 12/30/2017 0823   CREATININE 0.90 12/30/2017 0823      Component Value Date/Time   CALCIUM 8.6 12/30/2017 0823   ALKPHOS 218 (H) 12/30/2017 0823   AST 37 12/30/2017 0823   ALT 64 (H) 12/30/2017 0823   BILITOT 0.4 12/30/2017 0823       [name] is a 52 year old white male.  He has metastatic adenocarcinoma of the GE junction.  I would clearly treat this as a GE junction primary.  Thankfully, his tumor is HER-2 positive.  We will go ahead with his second cycle of treatment.  I am still awaiting the genetic profile of his malignancy.  Hopefully, we will have this back soon.  I think the fact that he is eating better is a good sign that he is responding.  The fact that the liver function tests are improved.  And the fact that his hemoglobin really has not dropped much I think all point to the fact that he is responding.  We will plan to have him back in 2 more weeks for follow-up and cycle #3.   [name], MD 10/15/20199:38 AM

## 2017-12-30 NOTE — Progress Notes (Signed)
Okay to treat today with ANC = 1.3 per [name].

## 2018-01-01 ENCOUNTER — Inpatient Hospital Stay: Payer: 59

## 2018-01-01 VITALS — BP 125/84 | HR 68 | Temp 98.2°F | Resp 20

## 2018-01-01 DIAGNOSIS — C160 Malignant neoplasm of cardia: Secondary | ICD-10-CM | POA: Diagnosis not present

## 2018-01-01 DIAGNOSIS — C799 Secondary malignant neoplasm of unspecified site: Principal | ICD-10-CM

## 2018-01-01 MED ORDER — SODIUM CHLORIDE 0.9% FLUSH
10.0000 mL | INTRAVENOUS | Status: DC | PRN
  Administered 2018-01-01: 10 mL
  Filled 2018-01-01: qty 10

## 2018-01-01 MED ORDER — HEPARIN SOD (PORK) LOCK FLUSH 100 UNIT/ML IV SOLN
500.0000 [IU] | Freq: Once | INTRAVENOUS | Status: AC | PRN
  Administered 2018-01-01: 500 [IU]
  Filled 2018-01-01: qty 5

## 2018-01-02 ENCOUNTER — Telehealth: Payer: Self-pay

## 2018-01-02 NOTE — Telephone Encounter (Signed)
Received call from pt stating that he is taking Restoril as prescribed but is still "tossing and turning" until about 3-4am when the medications seems to start to take effect.   Per Judson Roch, NP, pt to increase Restoril dose to 2 pills (30mg ) qhs. Pt verbalizes understanding and repeats back instructions. dph

## 2018-01-06 ENCOUNTER — Telehealth: Payer: Self-pay | Admitting: *Deleted

## 2018-01-06 ENCOUNTER — Other Ambulatory Visit: Payer: Self-pay | Admitting: Family

## 2018-01-06 NOTE — Telephone Encounter (Signed)
Patient continues to have problems falling asleep. He is taking 30mg  of Restoril qhs without effective treatment of his insomnia. He states he still stays awake until 2-3a each night.  Reviewed with Laverna Peace NP. She would like patient to try taking his ATIVAN 1mg  45 minutes prior to sleep to see if that helps with his ability to fall asleep. Patient understands instruction. He will use ativan for the next few nights and let office know how effective this treatment is.

## 2018-01-08 MED FILL — FAMOTIDINE 40 MG TABLET: 40 | 30 days supply | Qty: 60 | Fill #1

## 2018-01-13 ENCOUNTER — Inpatient Hospital Stay: Payer: 59

## 2018-01-13 ENCOUNTER — Other Ambulatory Visit: Payer: Self-pay

## 2018-01-13 ENCOUNTER — Inpatient Hospital Stay (HOSPITAL_BASED_OUTPATIENT_CLINIC_OR_DEPARTMENT_OTHER): Payer: 59 | Admitting: Hematology & Oncology

## 2018-01-13 ENCOUNTER — Encounter: Payer: Self-pay | Admitting: Hematology & Oncology

## 2018-01-13 VITALS — BP 116/79 | HR 78 | Temp 98.6°F | Resp 16 | Wt 179.4 lb

## 2018-01-13 DIAGNOSIS — D500 Iron deficiency anemia secondary to blood loss (chronic): Secondary | ICD-10-CM

## 2018-01-13 DIAGNOSIS — K921 Melena: Secondary | ICD-10-CM

## 2018-01-13 DIAGNOSIS — C787 Secondary malignant neoplasm of liver and intrahepatic bile duct: Secondary | ICD-10-CM

## 2018-01-13 DIAGNOSIS — Z79899 Other long term (current) drug therapy: Secondary | ICD-10-CM

## 2018-01-13 DIAGNOSIS — C160 Malignant neoplasm of cardia: Secondary | ICD-10-CM

## 2018-01-13 DIAGNOSIS — C799 Secondary malignant neoplasm of unspecified site: Principal | ICD-10-CM

## 2018-01-13 DIAGNOSIS — K5900 Constipation, unspecified: Secondary | ICD-10-CM

## 2018-01-13 LAB — CMP (CANCER CENTER ONLY)
ALBUMIN: 3.1 g/dL — AB (ref 3.5–5.0)
ALK PHOS: 132 U/L — AB (ref 26–84)
ALT: 51 U/L — AB (ref 10–47)
AST: 33 U/L (ref 11–38)
Anion gap: 4 — ABNORMAL LOW (ref 5–15)
BILIRUBIN TOTAL: 0.5 mg/dL (ref 0.2–1.6)
BUN: 11 mg/dL (ref 7–22)
CALCIUM: 9.3 mg/dL (ref 8.0–10.3)
CO2: 28 mmol/L (ref 18–33)
CREATININE: 1.1 mg/dL (ref 0.60–1.20)
Chloride: 111 mmol/L — ABNORMAL HIGH (ref 98–108)
Glucose, Bld: 151 mg/dL — ABNORMAL HIGH (ref 73–118)
Potassium: 3.9 mmol/L (ref 3.3–4.7)
SODIUM: 143 mmol/L (ref 128–145)
TOTAL PROTEIN: 6.2 g/dL — AB (ref 6.4–8.1)

## 2018-01-13 LAB — CBC WITH DIFFERENTIAL (CANCER CENTER ONLY)
ABS IMMATURE GRANULOCYTES: 0.01 10*3/uL (ref 0.00–0.07)
BASOS PCT: 1 %
Basophils Absolute: 0 10*3/uL (ref 0.0–0.1)
EOS ABS: 0 10*3/uL (ref 0.0–0.5)
EOS PCT: 1 %
HCT: 35.3 % — ABNORMAL LOW (ref 39.0–52.0)
Hemoglobin: 11.1 g/dL — ABNORMAL LOW (ref 13.0–17.0)
Immature Granulocytes: 0 %
Lymphocytes Relative: 38 %
Lymphs Abs: 1.1 10*3/uL (ref 0.7–4.0)
MCH: 26.7 pg (ref 26.0–34.0)
MCHC: 31.4 g/dL (ref 30.0–36.0)
MCV: 85.1 fL (ref 80.0–100.0)
MONO ABS: 0.4 10*3/uL (ref 0.1–1.0)
MONOS PCT: 14 %
Neutro Abs: 1.3 10*3/uL — ABNORMAL LOW (ref 1.7–7.7)
Neutrophils Relative %: 46 %
Platelet Count: 118 10*3/uL — ABNORMAL LOW (ref 150–400)
RBC: 4.15 MIL/uL — ABNORMAL LOW (ref 4.22–5.81)
RDW: 14.1 % (ref 11.5–15.5)
WBC Count: 2.8 10*3/uL — ABNORMAL LOW (ref 4.0–10.5)
nRBC: 0 % (ref 0.0–0.2)

## 2018-01-13 LAB — IRON AND TIBC
IRON: 31 ug/dL — AB (ref 42–163)
SATURATION RATIOS: 10 % — AB (ref 20–55)
TIBC: 319 ug/dL (ref 202–409)
UIBC: 288 ug/dL (ref 117–376)

## 2018-01-13 LAB — FERRITIN: FERRITIN: 105 ng/mL (ref 24–336)

## 2018-01-13 LAB — CEA (IN HOUSE-CHCC): CEA (CHCC-IN HOUSE): 3.91 ng/mL (ref 0.00–5.00)

## 2018-01-13 MED ORDER — TRASTUZUMAB CHEMO 150 MG IV SOLR
4.0000 mg/kg | Freq: Once | INTRAVENOUS | Status: AC
  Administered 2018-01-13: 357 mg via INTRAVENOUS
  Filled 2018-01-13: qty 17

## 2018-01-13 MED ORDER — LORAZEPAM 0.5 MG PO TABS: 1.0000 mg | ORAL_TABLET | Freq: Every day | ORAL | 0 refills | Status: DC

## 2018-01-13 MED ORDER — DEXAMETHASONE SODIUM PHOSPHATE 10 MG/ML IJ SOLN
INTRAMUSCULAR | Status: AC
  Filled 2018-01-13: qty 1

## 2018-01-13 MED ORDER — SODIUM CHLORIDE 0.9% FLUSH
10.0000 mL | INTRAVENOUS | Status: DC | PRN
  Filled 2018-01-13: qty 10

## 2018-01-13 MED ORDER — SODIUM CHLORIDE 0.9 % IV SOLN
2400.0000 mg/m2 | INTRAVENOUS | Status: DC
  Administered 2018-01-13: 5000 mg via INTRAVENOUS
  Filled 2018-01-13: qty 100

## 2018-01-13 MED ORDER — ACETAMINOPHEN 325 MG PO TABS
650.0000 mg | ORAL_TABLET | Freq: Once | ORAL | Status: AC
  Administered 2018-01-13: 650 mg via ORAL

## 2018-01-13 MED ORDER — PALONOSETRON HCL INJECTION 0.25 MG/5ML
INTRAVENOUS | Status: AC
  Filled 2018-01-13: qty 5

## 2018-01-13 MED ORDER — DEXAMETHASONE SODIUM PHOSPHATE 10 MG/ML IJ SOLN
10.0000 mg | Freq: Once | INTRAMUSCULAR | Status: AC
  Administered 2018-01-13: 10 mg via INTRAVENOUS

## 2018-01-13 MED ORDER — HEPARIN SOD (PORK) LOCK FLUSH 100 UNIT/ML IV SOLN
500.0000 [IU] | Freq: Once | INTRAVENOUS | Status: DC | PRN
  Filled 2018-01-13: qty 5

## 2018-01-13 MED ORDER — ACETAMINOPHEN 325 MG PO TABS
ORAL_TABLET | ORAL | Status: AC
  Filled 2018-01-13: qty 2

## 2018-01-13 MED ORDER — SODIUM CHLORIDE 0.9 % IV SOLN
Freq: Once | INTRAVENOUS | Status: AC
  Administered 2018-01-13: 13:00:00 via INTRAVENOUS
  Filled 2018-01-13: qty 250

## 2018-01-13 MED ORDER — OXALIPLATIN CHEMO INJECTION 100 MG/20ML
85.0000 mg/m2 | Freq: Once | INTRAVENOUS | Status: AC
  Administered 2018-01-13: 175 mg via INTRAVENOUS
  Filled 2018-01-13: qty 35

## 2018-01-13 MED ORDER — PALONOSETRON HCL INJECTION 0.25 MG/5ML
0.2500 mg | Freq: Once | INTRAVENOUS | Status: AC
  Administered 2018-01-13: 0.25 mg via INTRAVENOUS

## 2018-01-13 MED ORDER — FLUOROURACIL CHEMO INJECTION 2.5 GM/50ML
400.0000 mg/m2 | Freq: Once | INTRAVENOUS | Status: AC
  Administered 2018-01-13: 850 mg via INTRAVENOUS
  Filled 2018-01-13: qty 17

## 2018-01-13 MED ORDER — DEXTROSE 5 % IV SOLN
Freq: Once | INTRAVENOUS | Status: AC
  Administered 2018-01-13: 10:00:00 via INTRAVENOUS
  Filled 2018-01-13: qty 250

## 2018-01-13 MED ORDER — DEXTROSE 5 % IV SOLN
Freq: Once | INTRAVENOUS | Status: AC
  Administered 2018-01-13: 11:00:00 via INTRAVENOUS
  Filled 2018-01-13: qty 250

## 2018-01-13 MED ORDER — LEUCOVORIN CALCIUM INJECTION 350 MG
400.0000 mg/m2 | Freq: Once | INTRAVENOUS | Status: AC
  Administered 2018-01-13: 832 mg via INTRAVENOUS
  Filled 2018-01-13: qty 41.6

## 2018-01-13 MED ORDER — DIPHENHYDRAMINE HCL 25 MG PO CAPS
50.0000 mg | ORAL_CAPSULE | Freq: Once | ORAL | Status: AC
  Administered 2018-01-13: 50 mg via ORAL

## 2018-01-13 MED ORDER — ONDANSETRON HCL 8 MG PO TABS: 8.0000 mg | ORAL_TABLET | Freq: Two times a day (BID) | ORAL | 1 refills | Status: DC | PRN

## 2018-01-13 MED ORDER — DIPHENHYDRAMINE HCL 25 MG PO CAPS
ORAL_CAPSULE | ORAL | Status: AC
  Filled 2018-01-13: qty 2

## 2018-01-13 MED FILL — LORazepam 0.5 MG TABS: 0.5 | 30 days supply | Qty: 60 | Fill #0

## 2018-01-13 MED FILL — ONDANSETRON HCL 8 MG TAB: 8 | 15 days supply | Qty: 30 | Fill #0

## 2018-01-13 NOTE — Addendum Note (Signed)
Addended by: Burney Gauze R on: 01/13/2018 10:09 AM   Modules accepted: Orders

## 2018-01-13 NOTE — Patient Instructions (Signed)
[facility] Discharge Instructions for Patients Receiving Chemotherapy  Today you received the following chemotherapy agents 5FU, Oxaliplatin  To help prevent nausea and vomiting after your treatment, we encourage you to take your nausea medication    If you develop nausea and vomiting that is not controlled by your nausea medication, call the clinic.   BELOW ARE SYMPTOMS THAT SHOULD BE REPORTED IMMEDIATELY:  *FEVER GREATER THAN 100.5 F  *CHILLS WITH OR WITHOUT FEVER  NAUSEA AND VOMITING THAT IS NOT CONTROLLED WITH YOUR NAUSEA MEDICATION  *UNUSUAL SHORTNESS OF BREATH  *UNUSUAL BRUISING OR BLEEDING  TENDERNESS IN MOUTH AND THROAT WITH OR WITHOUT PRESENCE OF ULCERS  *URINARY PROBLEMS  *BOWEL PROBLEMS  UNUSUAL RASH Items with * indicate a potential emergency and should be followed up as soon as possible.  Feel free to call the clinic should you have any questions or concerns. The clinic phone number is [phone].  Please show the [facility] at check-in to the Emergency Department and triage nurse.

## 2018-01-13 NOTE — Progress Notes (Signed)
Ok to treat with ANC of 1.3 per [name]

## 2018-01-13 NOTE — Patient Instructions (Signed)
Implanted Port Insertion, Care After °This sheet gives you information about how to care for yourself after your procedure. Your health care provider may also give you more specific instructions. If you have problems or questions, contact your health care provider. °What can I expect after the procedure? °After your procedure, it is common to have: °· Discomfort at the port insertion site. °· Bruising on the skin over the port. This should improve over 3-4 days. ° °Follow these instructions at home: °Port care °· After your port is placed, you will get a manufacturer's information card. The card has information about your port. Keep this card with you at all times. °· Take care of the port as told by your health care provider. Ask your health care provider if you or a family member can get training for taking care of the port at home. A home health care nurse may also take care of the port. °· Make sure to remember what type of port you have. °Incision care °· Follow instructions from your health care provider about how to take care of your port insertion site. Make sure you: °? Wash your hands with soap and water before you change your bandage (dressing). If soap and water are not available, use hand sanitizer. °? Change your dressing as told by your health care provider. °? Leave stitches (sutures), skin glue, or adhesive strips in place. These skin closures may need to stay in place for 2 weeks or longer. If adhesive strip edges start to loosen and curl up, you may trim the loose edges. Do not remove adhesive strips completely unless your health care provider tells you to do that. °· Check your port insertion site every day for signs of infection. Check for: °? More redness, swelling, or pain. °? More fluid or blood. °? Warmth. °? Pus or a bad smell. °General instructions °· Do not take baths, swim, or use a hot tub until your health care provider approves. °· Do not lift anything that is heavier than 10 lb (4.5  kg) for a week, or as told by your health care provider. °· Ask your health care provider when it is okay to: °? Return to work or school. °? Resume usual physical activities or sports. °· Do not drive for 24 hours if you were given a medicine to help you relax (sedative). °· Take over-the-counter and prescription medicines only as told by your health care provider. °· Wear a medical alert bracelet in case of an emergency. This will tell any health care providers that you have a port. °· Keep all follow-up visits as told by your health care provider. This is important. °Contact a health care provider if: °· You cannot flush your port with saline as directed, or you cannot draw blood from the port. °· You have a fever or chills. °· You have more redness, swelling, or pain around your port insertion site. °· You have more fluid or blood coming from your port insertion site. °· Your port insertion site feels warm to the touch. °· You have pus or a bad smell coming from the port insertion site. °Get help right away if: °· You have chest pain or shortness of breath. °· You have bleeding from your port that you cannot control. °Summary °· Take care of the port as told by your health care provider. °· Change your dressing as told by your health care provider. °· Keep all follow-up visits as told by your health care provider. °  This information is not intended to replace advice given to you by your health care provider. Make sure you discuss any questions you have with your health care provider. °Document Released: 12/23/2012 Document Revised: 01/24/2016 Document Reviewed: 01/24/2016 °Elsevier Interactive Patient Education © 2017 [employer] ° °

## 2018-01-13 NOTE — Progress Notes (Signed)
Hematology and Oncology Follow Up Visit  Matthew Reynolds [id number] [birth year] 52 y.o. 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  He continues to do incredibly well.  He is swallowing much better.  He is eating solid food right now.  He is having steak and chicken.  There is been no bleeding.  He has had no melena or bright red blood per rectum.  He has had no abdominal pain.  He has had no cough.  There has been no tingling in the hands or feet.  He has had no mouth sores.  He has had no rashes.  His liver studies have improved nicely.  We have not had to transfuse him.  His genetic profile came back.  Unfortunately, the really is not much for [place] to target outside of the HER-2.  He is negative for PD-L1.  He has a stable MSI.  His tumor mutation burden is low.    Overall,  his performance status is ECOG 1.  Medications:  Current Outpatient Medications:  .  dexamethasone (DECADRON) 4 MG tablet, Take 2 tablets (8 mg total) by mouth daily. Start the day after chemotherapy for 2 days. Take with food., Disp: 30 tablet, Rfl: 1 .  famotidine (PEPCID) 40 MG tablet, Take 1 tablet (40 mg total) by mouth 2 (two) times daily., Disp: 180 tablet, Rfl: 3 .  lidocaine-prilocaine (EMLA) cream, Apply to affected area once, Disp: 30 g, Rfl: 3 .  LORazepam (ATIVAN) 0.5 MG tablet, Take 1 tablet (0.5 mg total) by mouth every 8 (eight) hours. (Patient taking differently: Take 1 mg by mouth at bedtime. ), Disp: 30 tablet, Rfl: 0 .  ondansetron (ZOFRAN) 8 MG tablet, Take 1 tablet (8 mg total) by mouth 2 (two) times daily as needed for refractory nausea / vomiting. Start on day 3 after chemotherapy., Disp: 30 tablet, Rfl: 1 .  polyethylene glycol (MIRALAX / GLYCOLAX) packet, Take 17 g by mouth 2 (two) times daily., Disp: , Rfl:  .  prochlorperazine  (COMPAZINE) 10 MG tablet, Take 1 tablet (10 mg total) by mouth every 6 (six) hours as needed (Nausea or vomiting)., Disp: 30 tablet, Rfl: 1  Allergies: No Known Allergies  Past Medical History, Surgical history, Social history, and Family History were reviewed and updated.  Review of Systems: Review of Systems  Constitutional: Negative.   HENT:  Negative.   Eyes: Negative.   Respiratory: Negative.   Cardiovascular: Negative.   Gastrointestinal: Positive for blood in stool and constipation.  Endocrine: Negative.   Genitourinary: Negative.    Musculoskeletal: Negative.   Neurological: Negative.   Hematological: Negative.   Psychiatric/Behavioral: Negative.     Physical Exam:  weight is 179 lb 6.4 oz (81.4 kg). His oral temperature is 98.6 F (37 C). His blood pressure is 116/79 and his pulse is 78. His respiration is 16 and oxygen saturation is 100%.   Wt Readings from Last 3 Encounters:  01/13/18 179 lb 6.4 oz (81.4 kg)  12/30/17 184 lb 6 oz (83.6 kg)  12/15/17 195 lb (88.5 kg)    Physical Exam  Constitutional: He is oriented to person, place, and time.  Well-developed and well-nourished white male.  There is no adenopathy in the neck.  His lungs are clear.  Oral exam shows no mucositis.  Extremities shows no clubbing, cyanosis  or edema.  HENT:  Head: Normocephalic and atraumatic.  Mouth/Throat: Oropharynx is clear and moist.  Eyes: Pupils are equal, round, and reactive to light. EOM are normal.  Neck: Normal range of motion.  Cardiovascular: Normal rate, regular rhythm and normal heart sounds.  Pulmonary/Chest: Effort normal and breath sounds normal.  Abdominal: Soft. Bowel sounds are normal.  Abdomen is soft.  He has decent bowel sounds.  There is no fluid wave.  There is no obvious hepato-splenomegaly.  Musculoskeletal: Normal range of motion. He exhibits no edema, tenderness or deformity.  Lymphadenopathy:    He has no cervical adenopathy.  Neurological: He is alert  and oriented to person, place, and time.  Skin: Skin is warm and dry. No rash noted. No erythema.  Psychiatric: He has a normal mood and affect. His behavior is normal. Judgment and thought content normal.  Vitals reviewed.    Lab Results  Component Value Date   WBC 2.8 (L) 01/13/2018   HGB 11.1 (L) 01/13/2018   HCT 35.3 (L) 01/13/2018   MCV 85.1 01/13/2018   PLT 118 (L) 01/13/2018     Chemistry      Component Value Date/Time   NA 143 01/13/2018 0800   K 3.9 01/13/2018 0800   CL 111 (H) 01/13/2018 0800   CO2 28 01/13/2018 0800   BUN 11 01/13/2018 0800   CREATININE 1.10 01/13/2018 0800      Component Value Date/Time   CALCIUM 9.3 01/13/2018 0800   ALKPHOS 132 (H) 01/13/2018 0800   AST 33 01/13/2018 0800   ALT 51 (H) 01/13/2018 0800   BILITOT 0.5 01/13/2018 0800       [name] is a 52 year old white male.  He has metastatic adenocarcinoma of the GE junction.  I would clearly treat this as a GE junction primary.  Thankfully, his tumor is HER-2 positive.  We will go ahead with his 3rd cycle of treatment.  I think the fact that he is eating better is a good sign that he is responding.  The fact that the liver function tests are improved.  And the fact that his hemoglobin really has not dropped much I think all point to the fact that he is responding.  We will plan to have him back in 2 more weeks for follow-up and cycle #4.  When we repeat his scans, also will get him back to gastroenterology for an EGD.  [name], MD 10/29/20199:38 AM

## 2018-01-15 ENCOUNTER — Inpatient Hospital Stay: Payer: 59

## 2018-01-15 VITALS — BP 117/81 | HR 85 | Temp 98.2°F | Resp 18

## 2018-01-15 DIAGNOSIS — C160 Malignant neoplasm of cardia: Secondary | ICD-10-CM

## 2018-01-15 DIAGNOSIS — C799 Secondary malignant neoplasm of unspecified site: Principal | ICD-10-CM

## 2018-01-15 MED ORDER — HEPARIN SOD (PORK) LOCK FLUSH 100 UNIT/ML IV SOLN
500.0000 [IU] | Freq: Once | INTRAVENOUS | Status: AC | PRN
  Administered 2018-01-15: 500 [IU]
  Filled 2018-01-15: qty 5

## 2018-01-15 MED ORDER — SODIUM CHLORIDE 0.9% FLUSH
10.0000 mL | INTRAVENOUS | Status: DC | PRN
  Administered 2018-01-15: 10 mL
  Filled 2018-01-15: qty 10

## 2018-01-27 ENCOUNTER — Inpatient Hospital Stay (HOSPITAL_BASED_OUTPATIENT_CLINIC_OR_DEPARTMENT_OTHER): Payer: 59 | Admitting: Hematology & Oncology

## 2018-01-27 ENCOUNTER — Inpatient Hospital Stay: Payer: 59

## 2018-01-27 ENCOUNTER — Encounter: Payer: Self-pay | Admitting: Hematology & Oncology

## 2018-01-27 ENCOUNTER — Inpatient Hospital Stay: Payer: 59 | Attending: Hematology & Oncology

## 2018-01-27 ENCOUNTER — Other Ambulatory Visit: Payer: Self-pay

## 2018-01-27 VITALS — Wt 173.1 lb

## 2018-01-27 DIAGNOSIS — K5900 Constipation, unspecified: Secondary | ICD-10-CM

## 2018-01-27 DIAGNOSIS — C160 Malignant neoplasm of cardia: Secondary | ICD-10-CM

## 2018-01-27 DIAGNOSIS — Z5111 Encounter for antineoplastic chemotherapy: Secondary | ICD-10-CM | POA: Insufficient documentation

## 2018-01-27 DIAGNOSIS — R202 Paresthesia of skin: Secondary | ICD-10-CM | POA: Diagnosis not present

## 2018-01-27 DIAGNOSIS — Z5112 Encounter for antineoplastic immunotherapy: Secondary | ICD-10-CM | POA: Insufficient documentation

## 2018-01-27 DIAGNOSIS — K921 Melena: Secondary | ICD-10-CM

## 2018-01-27 DIAGNOSIS — D500 Iron deficiency anemia secondary to blood loss (chronic): Secondary | ICD-10-CM

## 2018-01-27 DIAGNOSIS — Z79899 Other long term (current) drug therapy: Secondary | ICD-10-CM | POA: Insufficient documentation

## 2018-01-27 DIAGNOSIS — C799 Secondary malignant neoplasm of unspecified site: Secondary | ICD-10-CM

## 2018-01-27 LAB — CBC WITH DIFFERENTIAL (CANCER CENTER ONLY)
ABS IMMATURE GRANULOCYTES: 0 10*3/uL (ref 0.00–0.07)
BASOS PCT: 1 %
Basophils Absolute: 0 10*3/uL (ref 0.0–0.1)
EOS PCT: 1 %
Eosinophils Absolute: 0 10*3/uL (ref 0.0–0.5)
HCT: 36.5 % — ABNORMAL LOW (ref 39.0–52.0)
Hemoglobin: 11.5 g/dL — ABNORMAL LOW (ref 13.0–17.0)
Immature Granulocytes: 0 %
Lymphocytes Relative: 41 %
Lymphs Abs: 1.2 10*3/uL (ref 0.7–4.0)
MCH: 26.5 pg (ref 26.0–34.0)
MCHC: 31.5 g/dL (ref 30.0–36.0)
MCV: 84.1 fL (ref 80.0–100.0)
MONO ABS: 0.5 10*3/uL (ref 0.1–1.0)
MONOS PCT: 16 %
NEUTROS ABS: 1.2 10*3/uL — AB (ref 1.7–7.7)
Neutrophils Relative %: 41 %
PLATELETS: 90 10*3/uL — AB (ref 150–400)
RBC: 4.34 MIL/uL (ref 4.22–5.81)
RDW: 14.1 % (ref 11.5–15.5)
WBC Count: 2.9 10*3/uL — ABNORMAL LOW (ref 4.0–10.5)
nRBC: 0 % (ref 0.0–0.2)

## 2018-01-27 LAB — CMP (CANCER CENTER ONLY)
ALT: 29 U/L (ref 10–47)
ANION GAP: 5 (ref 5–15)
AST: 34 U/L (ref 11–38)
Albumin: 3.1 g/dL — ABNORMAL LOW (ref 3.5–5.0)
Alkaline Phosphatase: 88 U/L — ABNORMAL HIGH (ref 26–84)
BUN: 13 mg/dL (ref 7–22)
CHLORIDE: 107 mmol/L (ref 98–108)
CO2: 27 mmol/L (ref 18–33)
Calcium: 9.2 mg/dL (ref 8.0–10.3)
Creatinine: 1.1 mg/dL (ref 0.60–1.20)
GLUCOSE: 113 mg/dL (ref 73–118)
POTASSIUM: 4.1 mmol/L (ref 3.3–4.7)
SODIUM: 139 mmol/L (ref 128–145)
TOTAL PROTEIN: 6.3 g/dL — AB (ref 6.4–8.1)
Total Bilirubin: 0.7 mg/dL (ref 0.2–1.6)

## 2018-01-27 MED ORDER — DEXTROSE 5 % IV SOLN
Freq: Once | INTRAVENOUS | Status: AC
  Administered 2018-01-27: 12:00:00 via INTRAVENOUS
  Filled 2018-01-27: qty 250

## 2018-01-27 MED ORDER — LEUCOVORIN CALCIUM INJECTION 350 MG
400.0000 mg/m2 | Freq: Once | INTRAVENOUS | Status: AC
  Administered 2018-01-27: 832 mg via INTRAVENOUS
  Filled 2018-01-27: qty 41.6

## 2018-01-27 MED ORDER — ACETAMINOPHEN 325 MG PO TABS
650.0000 mg | ORAL_TABLET | Freq: Once | ORAL | Status: AC
  Administered 2018-01-27: 650 mg via ORAL

## 2018-01-27 MED ORDER — PALONOSETRON HCL INJECTION 0.25 MG/5ML
0.2500 mg | Freq: Once | INTRAVENOUS | Status: AC
  Administered 2018-01-27: 0.25 mg via INTRAVENOUS

## 2018-01-27 MED ORDER — TRASTUZUMAB CHEMO 150 MG IV SOLR
4.0000 mg/kg | Freq: Once | INTRAVENOUS | Status: AC
  Administered 2018-01-27: 357 mg via INTRAVENOUS
  Filled 2018-01-27: qty 17

## 2018-01-27 MED ORDER — ACETAMINOPHEN 325 MG PO TABS
ORAL_TABLET | ORAL | Status: AC
  Filled 2018-01-27: qty 2

## 2018-01-27 MED ORDER — HEPARIN SOD (PORK) LOCK FLUSH 100 UNIT/ML IV SOLN
500.0000 [IU] | Freq: Once | INTRAVENOUS | Status: DC | PRN
  Filled 2018-01-27: qty 5

## 2018-01-27 MED ORDER — SODIUM CHLORIDE 0.9 % IV SOLN
Freq: Once | INTRAVENOUS | Status: AC
  Administered 2018-01-27: 10:00:00 via INTRAVENOUS
  Filled 2018-01-27: qty 250

## 2018-01-27 MED ORDER — PALONOSETRON HCL INJECTION 0.25 MG/5ML
INTRAVENOUS | Status: AC
  Filled 2018-01-27: qty 5

## 2018-01-27 MED ORDER — SODIUM CHLORIDE 0.9 % IV SOLN
2400.0000 mg/m2 | INTRAVENOUS | Status: DC
  Administered 2018-01-27: 5000 mg via INTRAVENOUS
  Filled 2018-01-27: qty 100

## 2018-01-27 MED ORDER — SODIUM CHLORIDE 0.9 % IV SOLN
510.0000 mg | Freq: Once | INTRAVENOUS | Status: AC
  Administered 2018-01-27: 510 mg via INTRAVENOUS
  Filled 2018-01-27: qty 17

## 2018-01-27 MED ORDER — DIPHENHYDRAMINE HCL 25 MG PO CAPS
50.0000 mg | ORAL_CAPSULE | Freq: Once | ORAL | Status: AC
  Administered 2018-01-27: 50 mg via ORAL

## 2018-01-27 MED ORDER — OXALIPLATIN CHEMO INJECTION 100 MG/20ML
85.0000 mg/m2 | Freq: Once | INTRAVENOUS | Status: AC
  Administered 2018-01-27: 175 mg via INTRAVENOUS
  Filled 2018-01-27: qty 35

## 2018-01-27 MED ORDER — DEXAMETHASONE SODIUM PHOSPHATE 10 MG/ML IJ SOLN
10.0000 mg | Freq: Once | INTRAMUSCULAR | Status: AC
  Administered 2018-01-27: 10 mg via INTRAVENOUS

## 2018-01-27 MED ORDER — FLUOROURACIL CHEMO INJECTION 2.5 GM/50ML
400.0000 mg/m2 | Freq: Once | INTRAVENOUS | Status: AC
  Administered 2018-01-27: 850 mg via INTRAVENOUS
  Filled 2018-01-27: qty 17

## 2018-01-27 MED ORDER — SODIUM CHLORIDE 0.9% FLUSH
10.0000 mL | INTRAVENOUS | Status: DC | PRN
  Filled 2018-01-27: qty 10

## 2018-01-27 MED ORDER — DEXAMETHASONE SODIUM PHOSPHATE 10 MG/ML IJ SOLN
INTRAMUSCULAR | Status: AC
  Filled 2018-01-27: qty 1

## 2018-01-27 MED ORDER — DIPHENHYDRAMINE HCL 25 MG PO CAPS
ORAL_CAPSULE | ORAL | Status: AC
  Filled 2018-01-27: qty 2

## 2018-01-27 NOTE — Progress Notes (Signed)
Ok to treat with platelets of 90 per Dr Marin Olp. dph

## 2018-01-27 NOTE — Patient Instructions (Signed)
Implanted Port Home Guide An implanted port is a type of central line that is placed under the skin. Central lines are used to provide IV access when treatment or nutrition needs to be given through a person's veins. Implanted ports are used for long-term IV access. An implanted port may be placed because:  You need IV medicine that would be irritating to the small veins in your hands or arms.  You need long-term IV medicines, such as antibiotics.  You need IV nutrition for a long period.  You need frequent blood draws for lab tests.  You need dialysis.  Implanted ports are usually placed in the chest area, but they can also be placed in the upper arm, the abdomen, or the leg. An implanted port has two main parts:  Reservoir. The reservoir is round and will appear as a small, raised area under your skin. The reservoir is the part where a needle is inserted to give medicines or draw blood.  Catheter. The catheter is a thin, flexible tube that extends from the reservoir. The catheter is placed into a large vein. Medicine that is inserted into the reservoir goes into the catheter and then into the vein.  How will I care for my incision site? Do not get the incision site wet. Bathe or shower as directed by your health care provider. How is my port accessed? Special steps must be taken to access the port:  Before the port is accessed, a numbing cream can be placed on the skin. This helps numb the skin over the port site.  Your health care provider uses a sterile technique to access the port. ? Your health care provider must put on a mask and sterile gloves. ? The skin over your port is cleaned carefully with an antiseptic and allowed to dry. ? The port is gently pinched between sterile gloves, and a needle is inserted into the port.  Only "non-coring" port needles should be used to access the port. Once the port is accessed, a blood return should be checked. This helps ensure that the port  is in the vein and is not clogged.  If your port needs to remain accessed for a constant infusion, a clear (transparent) bandage will be placed over the needle site. The bandage and needle will need to be changed every week, or as directed by your health care provider.  Keep the bandage covering the needle clean and dry. Do not get it wet. Follow your health care provider's instructions on how to take a shower or bath while the port is accessed.  If your port does not need to stay accessed, no bandage is needed over the port.  What is flushing? Flushing helps keep the port from getting clogged. Follow your health care provider's instructions on how and when to flush the port. Ports are usually flushed with saline solution or a medicine called heparin. The need for flushing will depend on how the port is used.  If the port is used for intermittent medicines or blood draws, the port will need to be flushed: ? After medicines have been given. ? After blood has been drawn. ? As part of routine maintenance.  If a constant infusion is running, the port may not need to be flushed.  How long will my port stay implanted? The port can stay in for as long as your health care provider thinks it is needed. When it is time for the port to come out, surgery will be   done to remove it. The procedure is similar to the one performed when the port was put in. When should I seek immediate medical care? When you have an implanted port, you should seek immediate medical care if:  You notice a bad smell coming from the incision site.  You have swelling, redness, or drainage at the incision site.  You have more swelling or pain at the port site or the surrounding area.  You have a fever that is not controlled with medicine.  This information is not intended to replace advice given to you by your health care provider. Make sure you discuss any questions you have with your health care provider. Document  Released: 03/04/2005 Document Revised: 08/10/2015 Document Reviewed: 11/09/2012 Elsevier Interactive Patient Education  2017 [employer]  

## 2018-01-27 NOTE — Patient Instructions (Signed)
[facility] Discharge Instructions for Patients Receiving Chemotherapy  Today you received the following chemotherapy agents 5FU, Leucovorin, Oxaliplatin, Herceptin  To help prevent nausea and vomiting after your treatment, we encourage you to take your nausea medication    If you develop nausea and vomiting that is not controlled by your nausea medication, call the clinic.   BELOW ARE SYMPTOMS THAT SHOULD BE REPORTED IMMEDIATELY:  *FEVER GREATER THAN 100.5 F  *CHILLS WITH OR WITHOUT FEVER  NAUSEA AND VOMITING THAT IS NOT CONTROLLED WITH YOUR NAUSEA MEDICATION  *UNUSUAL SHORTNESS OF BREATH  *UNUSUAL BRUISING OR BLEEDING  TENDERNESS IN MOUTH AND THROAT WITH OR WITHOUT PRESENCE OF ULCERS  *URINARY PROBLEMS  *BOWEL PROBLEMS  UNUSUAL RASH Items with * indicate a potential emergency and should be followed up as soon as possible.  Feel free to call the clinic should you have any questions or concerns. The clinic phone number is [phone].  Please show the [facility] at check-in to the Emergency Department and triage nurse.

## 2018-01-27 NOTE — Progress Notes (Signed)
Patient has had weight loss.  Leave Herceptin at current dose per [name].

## 2018-01-27 NOTE — Progress Notes (Signed)
Reviewed labs with [name].  Ok to treat

## 2018-01-27 NOTE — Progress Notes (Signed)
Hematology and Oncology Follow Up Visit  CORAL SOLER [id number] February 24, 1966 52 y.o. 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 Dacanay is back for follow-up.  He looks fantastic.  He really has improved ever since we started him on treatment.  He is eating well.  He is pretty much swallowing what he would like although he is having some problems with bread and pastries.  He is not having any pain.  There is no bleeding.  His iron is still on the low side.  Only last saw him a couple weeks ago, his ferritin was only 10.  We will give him a dose of IV iron today.  He has had no cough.  Has had no mouth sores.    He has had some tingling in the hands and feet.  I am sure this is probably from the oxaliplatin.  There is no evidence of any type of cardiac issues from the Herceptin.  He is still working.  He and his family looking forward to a nice Thanksgiving.  Overall,  his performance status is ECOG 1.  Medications:  Current Outpatient Medications:  .  dexamethasone (DECADRON) 4 MG tablet, Take 2 tablets (8 mg total) by mouth daily. Start the day after chemotherapy for 2 days. Take with food., Disp: 30 tablet, Rfl: 1 .  famotidine (PEPCID) 40 MG tablet, Take 1 tablet (40 mg total) by mouth 2 (two) times daily., Disp: 180 tablet, Rfl: 3 .  lidocaine-prilocaine (EMLA) cream, Apply to affected area once, Disp: 30 g, Rfl: 3 .  LORazepam (ATIVAN) 0.5 MG tablet, Take 2 tablets (1 mg total) by mouth at bedtime., Disp: 90 tablet, Rfl: 0 .  ondansetron (ZOFRAN) 8 MG tablet, Take 1 tablet (8 mg total) by mouth 2 (two) times daily as needed for refractory nausea / vomiting. Start on day 3 after chemotherapy., Disp: 30 tablet, Rfl: 1 .  polyethylene glycol (MIRALAX / GLYCOLAX) packet, Take 17 g by mouth 2 (two) times daily., Disp: , Rfl:  .  prochlorperazine  (COMPAZINE) 10 MG tablet, Take 1 tablet (10 mg total) by mouth every 6 (six) hours as needed (Nausea or vomiting)., Disp: 30 tablet, Rfl: 1 No current facility-administered medications for this visit.   Facility-Administered Medications Ordered in Other Visits:  .  fluorouracil (ADRUCIL) 5,000 mg in sodium chloride 0.9 % 150 mL chemo infusion, 2,400 mg/m2 (Treatment Plan Recorded), Intravenous, 1 day or 1 dose, Addelynn Batte R, MD .  heparin lock flush 100 unit/mL, 500 Units, Intracatheter, Once PRN, [name], MD .  sodium chloride flush (NS) 0.9 % injection 10 mL, 10 mL, Intracatheter, PRN, Marin Olp, Rudell Cobb, MD  Allergies: No Known Allergies  Past Medical History, Surgical history, Social history, and Family History were reviewed and updated.  Review of Systems: Review of Systems  Constitutional: Negative.   HENT:  Negative.   Eyes: Negative.   Respiratory: Negative.   Cardiovascular: Negative.   Gastrointestinal: Positive for blood in stool and constipation.  Endocrine: Negative.   Genitourinary: Negative.    Musculoskeletal: Negative.   Neurological: Negative.   Hematological: Negative.   Psychiatric/Behavioral: Negative.     Physical Exam:  vitals were not taken for this visit.   Wt Readings from Last 3 Encounters:  01/27/18 173 lb 1.3 oz (78.5 kg)  01/13/18 179 lb 6.4 oz (81.4 kg)  12/30/17 184 lb 6 oz (83.6 kg)    Physical Exam  Constitutional: He is oriented to person, place, and time.  Well-developed and well-nourished white male.  There is no adenopathy in the neck.  His lungs are clear.  Oral exam shows no mucositis.  Extremities shows no clubbing, cyanosis or edema.  HENT:  Head: Normocephalic and atraumatic.  Mouth/Throat: Oropharynx is clear and moist.  Eyes: Pupils are equal, round, and reactive to light. EOM are normal.  Neck: Normal range of motion.  Cardiovascular: Normal rate, regular rhythm and normal heart sounds.  Pulmonary/Chest: Effort  normal and breath sounds normal.  Abdominal: Soft. Bowel sounds are normal.  Abdomen is soft.  He has decent bowel sounds.  There is no fluid wave.  There is no obvious hepato-splenomegaly.  Musculoskeletal: Normal range of motion. He exhibits no edema, tenderness or deformity.  Lymphadenopathy:    He has no cervical adenopathy.  Neurological: He is alert and oriented to person, place, and time.  Skin: Skin is warm and dry. No rash noted. No erythema.  Psychiatric: He has a normal mood and affect. His behavior is normal. Judgment and thought content normal.  Vitals reviewed.    Lab Results  Component Value Date   WBC 2.9 (L) 01/27/2018   HGB 11.5 (L) 01/27/2018   HCT 36.5 (L) 01/27/2018   MCV 84.1 01/27/2018   PLT 90 (L) 01/27/2018     Chemistry      Component Value Date/Time   NA 139 01/27/2018 0823   K 4.1 01/27/2018 0823   CL 107 01/27/2018 0823   CO2 27 01/27/2018 0823   BUN 13 01/27/2018 0823   CREATININE 1.10 01/27/2018 0823      Component Value Date/Time   CALCIUM 9.2 01/27/2018 0823   ALKPHOS 88 (H) 01/27/2018 0823   AST 34 01/27/2018 0823   ALT 29 01/27/2018 0823   BILITOT 0.7 01/27/2018 0823       Impression and Plan: Mr. Hollenbach is a 52 year old white male.  He has metastatic adenocarcinoma of the GE junction.  I would clearly treat this as a GE junction primary.  Thankfully, his tumor is HER-2 positive.  We will now go with his fourth cycle of treatment.  With the fourth cycle, we will then rescan him with a PET scan.  I will also set him up with Dr.Mann of gastroenterology so that she can do a EGD on him.  I think that she will be very surprised as to how well he is responded.  I will give him an extra week off.  I think this would help.  His platelet count is down a little bit which might indicate some toxicity from treatment.  We will get him back in 3 weeks.  I am just so happy that his quality of life is doing so well right now.  [name],  MD 11/12/20191:55 PM

## 2018-01-29 ENCOUNTER — Inpatient Hospital Stay: Payer: 59

## 2018-01-29 ENCOUNTER — Other Ambulatory Visit: Payer: Self-pay

## 2018-01-29 VITALS — BP 120/84 | HR 61 | Temp 98.4°F | Resp 20

## 2018-01-29 DIAGNOSIS — C799 Secondary malignant neoplasm of unspecified site: Principal | ICD-10-CM

## 2018-01-29 DIAGNOSIS — C160 Malignant neoplasm of cardia: Secondary | ICD-10-CM | POA: Diagnosis not present

## 2018-01-29 MED ORDER — HEPARIN SOD (PORK) LOCK FLUSH 100 UNIT/ML IV SOLN
500.0000 [IU] | Freq: Once | INTRAVENOUS | Status: AC | PRN
  Administered 2018-01-29: 500 [IU]
  Filled 2018-01-29: qty 5

## 2018-01-29 MED ORDER — SODIUM CHLORIDE 0.9% FLUSH
10.0000 mL | INTRAVENOUS | Status: DC | PRN
  Administered 2018-01-29: 10 mL
  Filled 2018-01-29: qty 10

## 2018-01-29 NOTE — Patient Instructions (Signed)
Implanted Port Home Guide An implanted port is a type of central line that is placed under the skin. Central lines are used to provide IV access when treatment or nutrition needs to be given through a person's veins. Implanted ports are used for long-term IV access. An implanted port may be placed because:  You need IV medicine that would be irritating to the small veins in your hands or arms.  You need long-term IV medicines, such as antibiotics.  You need IV nutrition for a long period.  You need frequent blood draws for lab tests.  You need dialysis.  Implanted ports are usually placed in the chest area, but they can also be placed in the upper arm, the abdomen, or the leg. An implanted port has two main parts:  Reservoir. The reservoir is round and will appear as a small, raised area under your skin. The reservoir is the part where a needle is inserted to give medicines or draw blood.  Catheter. The catheter is a thin, flexible tube that extends from the reservoir. The catheter is placed into a large vein. Medicine that is inserted into the reservoir goes into the catheter and then into the vein.  How will I care for my incision site? Do not get the incision site wet. Bathe or shower as directed by your health care provider. How is my port accessed? Special steps must be taken to access the port:  Before the port is accessed, a numbing cream can be placed on the skin. This helps numb the skin over the port site.  Your health care provider uses a sterile technique to access the port. ? Your health care provider must put on a mask and sterile gloves. ? The skin over your port is cleaned carefully with an antiseptic and allowed to dry. ? The port is gently pinched between sterile gloves, and a needle is inserted into the port.  Only "non-coring" port needles should be used to access the port. Once the port is accessed, a blood return should be checked. This helps ensure that the port  is in the vein and is not clogged.  If your port needs to remain accessed for a constant infusion, a clear (transparent) bandage will be placed over the needle site. The bandage and needle will need to be changed every week, or as directed by your health care provider.  Keep the bandage covering the needle clean and dry. Do not get it wet. Follow your health care provider's instructions on how to take a shower or bath while the port is accessed.  If your port does not need to stay accessed, no bandage is needed over the port.  What is flushing? Flushing helps keep the port from getting clogged. Follow your health care provider's instructions on how and when to flush the port. Ports are usually flushed with saline solution or a medicine called heparin. The need for flushing will depend on how the port is used.  If the port is used for intermittent medicines or blood draws, the port will need to be flushed: ? After medicines have been given. ? After blood has been drawn. ? As part of routine maintenance.  If a constant infusion is running, the port may not need to be flushed.  How long will my port stay implanted? The port can stay in for as long as your health care provider thinks it is needed. When it is time for the port to come out, surgery will be   done to remove it. The procedure is similar to the one performed when the port was put in. When should I seek immediate medical care? When you have an implanted port, you should seek immediate medical care if:  You notice a bad smell coming from the incision site.  You have swelling, redness, or drainage at the incision site.  You have more swelling or pain at the port site or the surrounding area.  You have a fever that is not controlled with medicine.  This information is not intended to replace advice given to you by your health care provider. Make sure you discuss any questions you have with your health care provider. Document  Released: 03/04/2005 Document Revised: 08/10/2015 Document Reviewed: 11/09/2012 Elsevier Interactive Patient Education  2017 [employer]  

## 2018-02-09 ENCOUNTER — Ambulatory Visit: Payer: 59 | Admitting: Hematology & Oncology

## 2018-02-09 ENCOUNTER — Other Ambulatory Visit: Payer: 59

## 2018-02-09 ENCOUNTER — Ambulatory Visit: Payer: 59

## 2018-02-09 MED FILL — FAMOTIDINE 40 MG TABLET: 40 | 30 days supply | Qty: 60 | Fill #2

## 2018-02-10 ENCOUNTER — Other Ambulatory Visit: Payer: 59

## 2018-02-10 ENCOUNTER — Ambulatory Visit: Payer: 59

## 2018-02-10 ENCOUNTER — Encounter (HOSPITAL_COMMUNITY)
Admission: RE | Admit: 2018-02-10 | Discharge: 2018-02-10 | Disposition: A | Discharge status: Home / Self Care | Payer: 59 | Source: Ambulatory Visit | Attending: Hematology & Oncology | Admitting: Hematology & Oncology

## 2018-02-10 ENCOUNTER — Ambulatory Visit: Payer: 59 | Admitting: Hematology & Oncology

## 2018-02-10 ENCOUNTER — Telehealth: Payer: Self-pay | Admitting: *Deleted

## 2018-02-10 DIAGNOSIS — C160 Malignant neoplasm of cardia: Secondary | ICD-10-CM | POA: Diagnosis not present

## 2018-02-10 LAB — GLUCOSE, CAPILLARY: Glucose-Capillary: 89 mg/dL (ref 70–99)

## 2018-02-10 MED ORDER — FLUDEOXYGLUCOSE F - 18 (FDG) INJECTION
8.6000 | Freq: Once | INTRAVENOUS | Status: AC | PRN
  Administered 2018-02-10: 8.6 via INTRAVENOUS

## 2018-02-10 NOTE — Telephone Encounter (Signed)
As noted below by [name], I informed the patient that he has had a dramatic response to therapy. A lot of the PET scan activity is gone. He verbalized understanding.

## 2018-02-10 NOTE — Telephone Encounter (Signed)
-----   Message from Matthew Napoleon, MD sent at 02/10/2018 11:21 AM EST ----- Call - dramatic response to therapy!!  A lot of the PET scan activity is now gone!!!  Happy thanksgiving!!!  Laurey Arrow

## 2018-02-13 ENCOUNTER — Encounter: Payer: Self-pay | Admitting: Hematology & Oncology

## 2018-02-16 ENCOUNTER — Other Ambulatory Visit: Payer: Self-pay | Admitting: Gastroenterology

## 2018-02-16 DIAGNOSIS — R933 Abnormal findings on diagnostic imaging of other parts of digestive tract: Secondary | ICD-10-CM

## 2018-02-17 ENCOUNTER — Inpatient Hospital Stay: Payer: 59 | Attending: Hematology & Oncology

## 2018-02-17 ENCOUNTER — Other Ambulatory Visit: Payer: Self-pay

## 2018-02-17 ENCOUNTER — Encounter: Payer: Self-pay | Admitting: Hematology & Oncology

## 2018-02-17 ENCOUNTER — Inpatient Hospital Stay: Payer: 59

## 2018-02-17 ENCOUNTER — Inpatient Hospital Stay (HOSPITAL_BASED_OUTPATIENT_CLINIC_OR_DEPARTMENT_OTHER): Payer: 59 | Admitting: Hematology & Oncology

## 2018-02-17 ENCOUNTER — Telehealth: Payer: Self-pay | Admitting: Hematology & Oncology

## 2018-02-17 VITALS — BP 126/76 | HR 71 | Temp 98.4°F | Resp 16 | Wt 175.0 lb

## 2018-02-17 DIAGNOSIS — K921 Melena: Secondary | ICD-10-CM | POA: Insufficient documentation

## 2018-02-17 DIAGNOSIS — Z79899 Other long term (current) drug therapy: Secondary | ICD-10-CM | POA: Diagnosis not present

## 2018-02-17 DIAGNOSIS — K5900 Constipation, unspecified: Secondary | ICD-10-CM | POA: Diagnosis not present

## 2018-02-17 DIAGNOSIS — Z5112 Encounter for antineoplastic immunotherapy: Secondary | ICD-10-CM | POA: Diagnosis not present

## 2018-02-17 DIAGNOSIS — C160 Malignant neoplasm of cardia: Secondary | ICD-10-CM | POA: Diagnosis not present

## 2018-02-17 DIAGNOSIS — C799 Secondary malignant neoplasm of unspecified site: Principal | ICD-10-CM

## 2018-02-17 DIAGNOSIS — Z5111 Encounter for antineoplastic chemotherapy: Secondary | ICD-10-CM | POA: Diagnosis not present

## 2018-02-17 LAB — CBC WITH DIFFERENTIAL (CANCER CENTER ONLY)
Abs Immature Granulocytes: 0.01 10*3/uL (ref 0.00–0.07)
Basophils Absolute: 0 10*3/uL (ref 0.0–0.1)
Basophils Relative: 1 %
EOS ABS: 0 10*3/uL (ref 0.0–0.5)
Eosinophils Relative: 1 %
HCT: 41.9 % (ref 39.0–52.0)
Hemoglobin: 13 g/dL (ref 13.0–17.0)
IMMATURE GRANULOCYTES: 1 %
Lymphocytes Relative: 54 %
Lymphs Abs: 1.2 10*3/uL (ref 0.7–4.0)
MCH: 26.8 pg (ref 26.0–34.0)
MCHC: 31 g/dL (ref 30.0–36.0)
MCV: 86.4 fL (ref 80.0–100.0)
MONOS PCT: 19 %
Monocytes Absolute: 0.4 10*3/uL (ref 0.1–1.0)
NEUTROS PCT: 24 %
NRBC: 0 % (ref 0.0–0.2)
Neutro Abs: 0.5 10*3/uL — ABNORMAL LOW (ref 1.7–7.7)
PLATELETS: 169 10*3/uL (ref 150–400)
RBC: 4.85 MIL/uL (ref 4.22–5.81)
RDW: 16.5 % — AB (ref 11.5–15.5)
WBC: 2.1 10*3/uL — AB (ref 4.0–10.5)

## 2018-02-17 LAB — CMP (CANCER CENTER ONLY)
ALK PHOS: 91 U/L (ref 38–126)
ALT: 29 U/L (ref 0–44)
AST: 31 U/L (ref 15–41)
Albumin: 3.7 g/dL (ref 3.5–5.0)
Anion gap: 8 (ref 5–15)
BILIRUBIN TOTAL: 0.3 mg/dL (ref 0.3–1.2)
BUN: 7 mg/dL (ref 6–20)
CALCIUM: 8.8 mg/dL — AB (ref 8.9–10.3)
CO2: 28 mmol/L (ref 22–32)
CREATININE: 0.77 mg/dL (ref 0.61–1.24)
Chloride: 106 mmol/L (ref 98–111)
GFR, Est AFR Am: >60 mL/min (ref >60)
GFR, Estimated: >60 mL/min (ref >60)
Glucose, Bld: 94 mg/dL (ref 70–99)
Potassium: 4 mmol/L (ref 3.5–5.1)
Sodium: 142 mmol/L (ref 135–145)
TOTAL PROTEIN: 6.3 g/dL — AB (ref 6.5–8.1)

## 2018-02-17 MED ORDER — DIPHENHYDRAMINE HCL 25 MG PO CAPS
ORAL_CAPSULE | ORAL | Status: AC
  Filled 2018-02-17: qty 2

## 2018-02-17 MED ORDER — DIPHENHYDRAMINE HCL 25 MG PO CAPS
50.0000 mg | ORAL_CAPSULE | Freq: Once | ORAL | Status: AC
  Administered 2018-02-17: 50 mg via ORAL

## 2018-02-17 MED ORDER — DEXAMETHASONE SODIUM PHOSPHATE 10 MG/ML IJ SOLN
10.0000 mg | Freq: Once | INTRAMUSCULAR | Status: AC
  Administered 2018-02-17: 10 mg via INTRAVENOUS

## 2018-02-17 MED ORDER — LEUCOVORIN CALCIUM INJECTION 350 MG
400.0000 mg/m2 | Freq: Once | INTRAVENOUS | Status: AC
  Administered 2018-02-17: 832 mg via INTRAVENOUS
  Filled 2018-02-17: qty 41.6

## 2018-02-17 MED ORDER — PALONOSETRON HCL INJECTION 0.25 MG/5ML
INTRAVENOUS | Status: AC
  Filled 2018-02-17: qty 5

## 2018-02-17 MED ORDER — HEPARIN SOD (PORK) LOCK FLUSH 100 UNIT/ML IV SOLN
500.0000 [IU] | Freq: Once | INTRAVENOUS | Status: DC | PRN
  Filled 2018-02-17: qty 5

## 2018-02-17 MED ORDER — SODIUM CHLORIDE 0.9 % IV SOLN
Freq: Once | INTRAVENOUS | Status: AC
  Administered 2018-02-17: 12:00:00 via INTRAVENOUS
  Filled 2018-02-17: qty 250

## 2018-02-17 MED ORDER — DEXTROSE 5 % IV SOLN
Freq: Once | INTRAVENOUS | Status: AC
  Administered 2018-02-17: 14:00:00 via INTRAVENOUS
  Filled 2018-02-17: qty 250

## 2018-02-17 MED ORDER — PALONOSETRON HCL INJECTION 0.25 MG/5ML
0.2500 mg | Freq: Once | INTRAVENOUS | Status: AC
  Administered 2018-02-17: 0.25 mg via INTRAVENOUS

## 2018-02-17 MED ORDER — ACETAMINOPHEN 325 MG PO TABS
650.0000 mg | ORAL_TABLET | Freq: Once | ORAL | Status: AC
  Administered 2018-02-17: 650 mg via ORAL

## 2018-02-17 MED ORDER — SODIUM CHLORIDE 0.9% FLUSH
10.0000 mL | INTRAVENOUS | Status: DC | PRN
  Filled 2018-02-17: qty 10

## 2018-02-17 MED ORDER — ACETAMINOPHEN 325 MG PO TABS
ORAL_TABLET | ORAL | Status: AC
  Filled 2018-02-17: qty 2

## 2018-02-17 MED ORDER — TRASTUZUMAB CHEMO 150 MG IV SOLR
4.0000 mg/kg | Freq: Once | INTRAVENOUS | Status: AC
  Administered 2018-02-17: 357 mg via INTRAVENOUS
  Filled 2018-02-17: qty 17

## 2018-02-17 MED ORDER — SODIUM CHLORIDE 0.9 % IV SOLN
2400.0000 mg/m2 | INTRAVENOUS | Status: DC
  Administered 2018-02-17: 5000 mg via INTRAVENOUS
  Filled 2018-02-17: qty 100

## 2018-02-17 MED ORDER — FLUOROURACIL CHEMO INJECTION 2.5 GM/50ML
400.0000 mg/m2 | Freq: Once | INTRAVENOUS | Status: AC
  Administered 2018-02-17: 850 mg via INTRAVENOUS
  Filled 2018-02-17: qty 17

## 2018-02-17 MED ORDER — OXALIPLATIN CHEMO INJECTION 100 MG/20ML
85.0000 mg/m2 | Freq: Once | INTRAVENOUS | Status: AC
  Administered 2018-02-17: 175 mg via INTRAVENOUS
  Filled 2018-02-17: qty 20

## 2018-02-17 MED ORDER — DEXAMETHASONE SODIUM PHOSPHATE 10 MG/ML IJ SOLN
INTRAMUSCULAR | Status: AC
  Filled 2018-02-17: qty 1

## 2018-02-17 MED FILL — ONDANSETRON HCL 8 MG TABLET: 8 | 15 days supply | Qty: 30 | Fill #1

## 2018-02-17 NOTE — Progress Notes (Signed)
Hematology and Oncology Follow Up Visit  EZIO WIECK [id number] 18-Mar-1966 52 y.o. 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 Blandon is back for follow-up.  He is doing incredibly well.  We got good news on 2 different "fronts."  On his PET scan, the vast majority of his disease has disappeared.  He only has I think a small area of activity noted in the distal esophagus.  He had an upper endoscopy done.  Dr. Collene Mares could not find any obvious malignancy that was residual.  As such, I would have to say that Mr. Manninen is in a very good partial remission.  After 4 cycles of treatment, this is amazing.  He had a wonderful Thanksgiving.  He was able to eat what he would like.  He has had no problems with fatigue or weakness.  There is been no bleeding.  He has had no cough.  He has had no fever.  He has had no mouth sores.  The iron that we have given him on occasion really has helped his hemoglobin.  Overall,  his performance status is ECOG 1.  Medications:  Current Outpatient Medications:  .  dexlansoprazole (DEXILANT) 60 MG capsule, Take 60 mg by mouth daily., Disp: , Rfl:  .  dexamethasone (DECADRON) 4 MG tablet, Take 2 tablets (8 mg total) by mouth daily. Start the day after chemotherapy for 2 days. Take with food., Disp: 30 tablet, Rfl: 1 .  lidocaine-prilocaine (EMLA) cream, Apply to affected area once, Disp: 30 g, Rfl: 3 .  ondansetron (ZOFRAN) 8 MG tablet, Take 1 tablet (8 mg total) by mouth 2 (two) times daily as needed for refractory nausea / vomiting. Start on day 3 after chemotherapy., Disp: 30 tablet, Rfl: 1 .  polyethylene glycol (MIRALAX / GLYCOLAX) packet, Take 17 g by mouth 2 (two) times daily., Disp: , Rfl:  .  prochlorperazine (COMPAZINE) 10 MG tablet, Take 1 tablet (10 mg total) by mouth every 6 (six) hours as needed (Nausea or  vomiting)., Disp: 30 tablet, Rfl: 1  Allergies: No Known Allergies  Past Medical History, Surgical history, Social history, and Family History were reviewed and updated.  Review of Systems: Review of Systems  Constitutional: Negative.   HENT:  Negative.   Eyes: Negative.   Respiratory: Negative.   Cardiovascular: Negative.   Gastrointestinal: Positive for blood in stool and constipation.  Endocrine: Negative.   Genitourinary: Negative.    Musculoskeletal: Negative.   Neurological: Negative.   Hematological: Negative.   Psychiatric/Behavioral: Negative.     Physical Exam:  weight is 175 lb (79.4 kg). His oral temperature is 98.4 F (36.9 C). His blood pressure is 126/76 and his pulse is 71. His respiration is 16 and oxygen saturation is 100%.   Wt Readings from Last 3 Encounters:  02/17/18 175 lb (79.4 kg)  01/27/18 173 lb 1.3 oz (78.5 kg)  01/13/18 179 lb 6.4 oz (81.4 kg)    Physical Exam  Constitutional: He is oriented to person, place, and time.  Well-developed and well-nourished white male.  There is no adenopathy in the neck.  His lungs are clear.  Oral exam shows no mucositis.  Extremities shows no clubbing, cyanosis or edema.  HENT:  Head: Normocephalic and atraumatic.  Mouth/Throat: Oropharynx is clear and moist.  Eyes: Pupils are equal, round, and reactive to light. EOM are  normal.  Neck: Normal range of motion.  Cardiovascular: Normal rate, regular rhythm and normal heart sounds.  Pulmonary/Chest: Effort normal and breath sounds normal.  Abdominal: Soft. Bowel sounds are normal.  Abdomen is soft.  He has decent bowel sounds.  There is no fluid wave.  There is no obvious hepato-splenomegaly.  Musculoskeletal: Normal range of motion. He exhibits no edema, tenderness or deformity.  Lymphadenopathy:    He has no cervical adenopathy.  Neurological: He is alert and oriented to person, place, and time.  Skin: Skin is warm and dry. No rash noted. No erythema.    Psychiatric: He has a normal mood and affect. His behavior is normal. Judgment and thought content normal.  Vitals reviewed.    Lab Results  Component Value Date   WBC 2.1 (L) 02/17/2018   HGB 13.0 02/17/2018   HCT 41.9 02/17/2018   MCV 86.4 02/17/2018   PLT 169 02/17/2018     Chemistry      Component Value Date/Time   NA 142 02/17/2018 1051   K 4.0 02/17/2018 1051   CL 106 02/17/2018 1051   CO2 28 02/17/2018 1051   BUN 7 02/17/2018 1051   CREATININE 0.77 02/17/2018 1051      Component Value Date/Time   CALCIUM 8.8 (L) 02/17/2018 1051   ALKPHOS 91 02/17/2018 1051   AST 31 02/17/2018 1051   ALT 29 02/17/2018 1051   BILITOT 0.3 02/17/2018 1051       Impression and Plan: Mr. Sanger is a 52 year old white male.  He has metastatic adenocarcinoma of the GE junction.  I would clearly treat this as a GE junction primary.  Thankfully, his tumor is HER-2 positive.  I believe that the vast majority of the positive response is because of his tumor being HER-2 positive.  We will continue him on our FOLFOX/Herceptin protocol.  We will give him 4 more cycles.  After the eighth cycle, we will then repeat another PET scan on him.  If he has remained in a very good partial remission or even a complete remission, then we will see about a maintenance protocol for him.  I am just so happy that his quality of life has improved.  He feels like his old self.  His friends are incredibly impressed with how well he looks.  I know that he will have a wonderful Christmas and Sunset holiday.    I will plan to see him back in 2 more weeks.Marland Kitchen  [name], MD 12/3/201911:50 AM

## 2018-02-17 NOTE — Patient Instructions (Signed)
Implanted Port Insertion, Care After °This sheet gives you information about how to care for yourself after your procedure. Your health care provider may also give you more specific instructions. If you have problems or questions, contact your health care provider. °What can I expect after the procedure? °After your procedure, it is common to have: °· Discomfort at the port insertion site. °· Bruising on the skin over the port. This should improve over 3-4 days. ° °Follow these instructions at home: °Port care °· After your port is placed, you will get a manufacturer's information card. The card has information about your port. Keep this card with you at all times. °· Take care of the port as told by your health care provider. Ask your health care provider if you or a family member can get training for taking care of the port at home. A home health care nurse may also take care of the port. °· Make sure to remember what type of port you have. °Incision care °· Follow instructions from your health care provider about how to take care of your port insertion site. Make sure you: °? Wash your hands with soap and water before you change your bandage (dressing). If soap and water are not available, use hand sanitizer. °? Change your dressing as told by your health care provider. °? Leave stitches (sutures), skin glue, or adhesive strips in place. These skin closures may need to stay in place for 2 weeks or longer. If adhesive strip edges start to loosen and curl up, you may trim the loose edges. Do not remove adhesive strips completely unless your health care provider tells you to do that. °· Check your port insertion site every day for signs of infection. Check for: °? More redness, swelling, or pain. °? More fluid or blood. °? Warmth. °? Pus or a bad smell. °General instructions °· Do not take baths, swim, or use a hot tub until your health care provider approves. °· Do not lift anything that is heavier than 10 lb (4.5  kg) for a week, or as told by your health care provider. °· Ask your health care provider when it is okay to: °? Return to work or school. °? Resume usual physical activities or sports. °· Do not drive for 24 hours if you were given a medicine to help you relax (sedative). °· Take over-the-counter and prescription medicines only as told by your health care provider. °· Wear a medical alert bracelet in case of an emergency. This will tell any health care providers that you have a port. °· Keep all follow-up visits as told by your health care provider. This is important. °Contact a health care provider if: °· You cannot flush your port with saline as directed, or you cannot draw blood from the port. °· You have a fever or chills. °· You have more redness, swelling, or pain around your port insertion site. °· You have more fluid or blood coming from your port insertion site. °· Your port insertion site feels warm to the touch. °· You have pus or a bad smell coming from the port insertion site. °Get help right away if: °· You have chest pain or shortness of breath. °· You have bleeding from your port that you cannot control. °Summary °· Take care of the port as told by your health care provider. °· Change your dressing as told by your health care provider. °· Keep all follow-up visits as told by your health care provider. °  This information is not intended to replace advice given to you by your health care provider. Make sure you discuss any questions you have with your health care provider. °Document Released: 12/23/2012 Document Revised: 01/24/2016 Document Reviewed: 01/24/2016 °Elsevier Interactive Patient Education © 2017 [employer] ° °

## 2018-02-17 NOTE — Telephone Encounter (Signed)
Appointments scheduled calendar printed per 12/3 los

## 2018-02-17 NOTE — Patient Instructions (Signed)
[facility] °Discharge Instructions for Patients receiving Home Portable Chemo Pump  ° ° °**The bag should finish at 46 hours, 96 hours or 7 days. For example, if your pump is scheduled for 46 hours and it was put on at 4pm, it should finish at 2 pm the day it is scheduled to come off regardless of your appointment time.   ° °Estimated time to finish   _________________________ °(Have your nurse fill in)   °  °** if the display on your pump reads "Low Volume" and it is beeping, take the batteries out of the pump and come to the cancer center for it to be taken off.  ° °**If the pump alarms go off prior to the pump reading "Low Volume" then call the [phone] and someone can assist you. ° °**If the plunger comes out and the bag fluid is running out, please use your chemo spill kit to clean up the spill. Do not use paper towels or other house hold products. ° °** If you have problems or questions regarding your pump, please call either the [phone] or the cancer center Monday-Friday 8:00am-4:30pm at [phone] and we will assist you.  If you are unable to get assistance then go to [facility] Emergency Room, ask the staff to contact the IV team for assistance.   ° °

## 2018-02-17 NOTE — Progress Notes (Signed)
Patient with weight loss, leave Herceptin at current dose per [name].

## 2018-02-17 NOTE — Progress Notes (Signed)
Reviewed CBC with [name].  Ok to treat.

## 2018-02-18 LAB — IRON AND TIBC
Iron: 61 ug/dL (ref 42–163)
Saturation Ratios: 18 % — ABNORMAL LOW (ref 20–55)
TIBC: 335 ug/dL (ref 202–409)
UIBC: 274 ug/dL (ref 117–376)

## 2018-02-18 LAB — FERRITIN: Ferritin: 238 ng/mL (ref 24–336)

## 2018-02-19 ENCOUNTER — Inpatient Hospital Stay: Payer: 59

## 2018-02-19 ENCOUNTER — Other Ambulatory Visit: Payer: Self-pay

## 2018-02-19 VITALS — BP 128/89 | HR 66 | Temp 98.4°F | Resp 20

## 2018-02-19 DIAGNOSIS — C160 Malignant neoplasm of cardia: Secondary | ICD-10-CM

## 2018-02-19 DIAGNOSIS — C799 Secondary malignant neoplasm of unspecified site: Principal | ICD-10-CM

## 2018-02-19 MED ORDER — HEPARIN SOD (PORK) LOCK FLUSH 100 UNIT/ML IV SOLN
500.0000 [IU] | Freq: Once | INTRAVENOUS | Status: AC | PRN
  Administered 2018-02-19: 500 [IU]
  Filled 2018-02-19: qty 5

## 2018-02-19 MED ORDER — SODIUM CHLORIDE 0.9% FLUSH
10.0000 mL | INTRAVENOUS | Status: DC | PRN
  Administered 2018-02-19: 10 mL
  Filled 2018-02-19: qty 10

## 2018-02-19 NOTE — Patient Instructions (Signed)
Implanted Port Home Guide An implanted port is a type of central line that is placed under the skin. Central lines are used to provide IV access when treatment or nutrition needs to be given through a person's veins. Implanted ports are used for long-term IV access. An implanted port may be placed because:  You need IV medicine that would be irritating to the small veins in your hands or arms.  You need long-term IV medicines, such as antibiotics.  You need IV nutrition for a long period.  You need frequent blood draws for lab tests.  You need dialysis.  Implanted ports are usually placed in the chest area, but they can also be placed in the upper arm, the abdomen, or the leg. An implanted port has two main parts:  Reservoir. The reservoir is round and will appear as a small, raised area under your skin. The reservoir is the part where a needle is inserted to give medicines or draw blood.  Catheter. The catheter is a thin, flexible tube that extends from the reservoir. The catheter is placed into a large vein. Medicine that is inserted into the reservoir goes into the catheter and then into the vein.  How will I care for my incision site? Do not get the incision site wet. Bathe or shower as directed by your health care provider. How is my port accessed? Special steps must be taken to access the port:  Before the port is accessed, a numbing cream can be placed on the skin. This helps numb the skin over the port site.  Your health care provider uses a sterile technique to access the port. ? Your health care provider must put on a mask and sterile gloves. ? The skin over your port is cleaned carefully with an antiseptic and allowed to dry. ? The port is gently pinched between sterile gloves, and a needle is inserted into the port.  Only "non-coring" port needles should be used to access the port. Once the port is accessed, a blood return should be checked. This helps ensure that the port  is in the vein and is not clogged.  If your port needs to remain accessed for a constant infusion, a clear (transparent) bandage will be placed over the needle site. The bandage and needle will need to be changed every week, or as directed by your health care provider.  Keep the bandage covering the needle clean and dry. Do not get it wet. Follow your health care provider's instructions on how to take a shower or bath while the port is accessed.  If your port does not need to stay accessed, no bandage is needed over the port.  What is flushing? Flushing helps keep the port from getting clogged. Follow your health care provider's instructions on how and when to flush the port. Ports are usually flushed with saline solution or a medicine called heparin. The need for flushing will depend on how the port is used.  If the port is used for intermittent medicines or blood draws, the port will need to be flushed: ? After medicines have been given. ? After blood has been drawn. ? As part of routine maintenance.  If a constant infusion is running, the port may not need to be flushed.  How long will my port stay implanted? The port can stay in for as long as your health care provider thinks it is needed. When it is time for the port to come out, surgery will be   done to remove it. The procedure is similar to the one performed when the port was put in. When should I seek immediate medical care? When you have an implanted port, you should seek immediate medical care if:  You notice a bad smell coming from the incision site.  You have swelling, redness, or drainage at the incision site.  You have more swelling or pain at the port site or the surrounding area.  You have a fever that is not controlled with medicine.  This information is not intended to replace advice given to you by your health care provider. Make sure you discuss any questions you have with your health care provider. Document  Released: 03/04/2005 Document Revised: 08/10/2015 Document Reviewed: 11/09/2012 Elsevier Interactive Patient Education  2017 [employer]  

## 2018-02-24 ENCOUNTER — Other Ambulatory Visit: Payer: 59

## 2018-02-24 ENCOUNTER — Ambulatory Visit: Payer: 59

## 2018-02-24 ENCOUNTER — Ambulatory Visit: Payer: 59 | Admitting: Hematology & Oncology

## 2018-03-03 ENCOUNTER — Inpatient Hospital Stay: Payer: 59

## 2018-03-03 ENCOUNTER — Inpatient Hospital Stay (HOSPITAL_BASED_OUTPATIENT_CLINIC_OR_DEPARTMENT_OTHER): Payer: 59 | Admitting: Family

## 2018-03-03 ENCOUNTER — Other Ambulatory Visit: Payer: Self-pay

## 2018-03-03 VITALS — BP 120/83 | HR 82 | Temp 98.3°F | Resp 20 | Wt 177.0 lb

## 2018-03-03 DIAGNOSIS — K5900 Constipation, unspecified: Secondary | ICD-10-CM | POA: Diagnosis not present

## 2018-03-03 DIAGNOSIS — C160 Malignant neoplasm of cardia: Secondary | ICD-10-CM

## 2018-03-03 DIAGNOSIS — Z79899 Other long term (current) drug therapy: Secondary | ICD-10-CM | POA: Diagnosis not present

## 2018-03-03 DIAGNOSIS — K921 Melena: Secondary | ICD-10-CM | POA: Diagnosis not present

## 2018-03-03 DIAGNOSIS — C799 Secondary malignant neoplasm of unspecified site: Principal | ICD-10-CM

## 2018-03-03 LAB — IRON AND TIBC
Iron: 57 ug/dL (ref 42–163)
SATURATION RATIOS: 17 % — AB (ref 20–55)
TIBC: 334 ug/dL (ref 202–409)
UIBC: 277 ug/dL (ref 117–376)

## 2018-03-03 LAB — CMP (CANCER CENTER ONLY)
ALBUMIN: 3.7 g/dL (ref 3.5–5.0)
ALT: 33 U/L (ref 0–44)
AST: 29 U/L (ref 15–41)
Alkaline Phosphatase: 90 U/L (ref 38–126)
Anion gap: 8 (ref 5–15)
BUN: 9 mg/dL (ref 6–20)
CO2: 27 mmol/L (ref 22–32)
Calcium: 8.8 mg/dL — ABNORMAL LOW (ref 8.9–10.3)
Chloride: 106 mmol/L (ref 98–111)
Creatinine: 0.86 mg/dL (ref 0.61–1.24)
GFR, Est AFR Am: >60 mL/min (ref >60)
GFR, Estimated: >60 mL/min (ref >60)
Glucose, Bld: 93 mg/dL (ref 70–99)
Potassium: 3.7 mmol/L (ref 3.5–5.1)
Sodium: 141 mmol/L (ref 135–145)
Total Bilirubin: 0.4 mg/dL (ref 0.3–1.2)
Total Protein: 6 g/dL — ABNORMAL LOW (ref 6.5–8.1)

## 2018-03-03 LAB — CBC WITH DIFFERENTIAL (CANCER CENTER ONLY)
Abs Immature Granulocytes: 0.01 10*3/uL (ref 0.00–0.07)
BASOS ABS: 0 10*3/uL (ref 0.0–0.1)
Basophils Relative: 1 %
Eosinophils Absolute: 0 10*3/uL (ref 0.0–0.5)
Eosinophils Relative: 1 %
HEMATOCRIT: 40.1 % (ref 39.0–52.0)
Hemoglobin: 12.7 g/dL — ABNORMAL LOW (ref 13.0–17.0)
Immature Granulocytes: 0 %
LYMPHS ABS: 1.3 10*3/uL (ref 0.7–4.0)
Lymphocytes Relative: 44 %
MCH: 27.1 pg (ref 26.0–34.0)
MCHC: 31.7 g/dL (ref 30.0–36.0)
MCV: 85.7 fL (ref 80.0–100.0)
Monocytes Absolute: 0.4 10*3/uL (ref 0.1–1.0)
Monocytes Relative: 14 %
Neutro Abs: 1.2 10*3/uL — ABNORMAL LOW (ref 1.7–7.7)
Neutrophils Relative %: 40 %
Platelet Count: 65 10*3/uL — ABNORMAL LOW (ref 150–400)
RBC: 4.68 MIL/uL (ref 4.22–5.81)
RDW: 17.2 % — ABNORMAL HIGH (ref 11.5–15.5)
WBC Count: 3 10*3/uL — ABNORMAL LOW (ref 4.0–10.5)
nRBC: 0 % (ref 0.0–0.2)

## 2018-03-03 LAB — LACTATE DEHYDROGENASE: LDH: 162 U/L (ref 98–192)

## 2018-03-03 LAB — FERRITIN: Ferritin: 156 ng/mL (ref 24–336)

## 2018-03-03 MED ORDER — HEPARIN SOD (PORK) LOCK FLUSH 100 UNIT/ML IV SOLN
500.0000 [IU] | Freq: Once | INTRAVENOUS | Status: AC
  Administered 2018-03-03: 500 [IU] via INTRAVENOUS
  Filled 2018-03-03: qty 5

## 2018-03-03 MED ORDER — SODIUM CHLORIDE 0.9% FLUSH
10.0000 mL | INTRAVENOUS | Status: DC | PRN
  Administered 2018-03-03: 10 mL via INTRAVENOUS
  Filled 2018-03-03: qty 10

## 2018-03-03 NOTE — Progress Notes (Signed)
Hold treatment today per Dr. Benay Spice d/t platelet count of 65.

## 2018-03-03 NOTE — Progress Notes (Signed)
Hematology and Oncology Follow Up Visit  Matthew Reynolds [id number] 10-29-1965 52 y.o. 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 He is doing quite well and has tolerated treatment nicely so far.  He has had some tenderness right inside the nose after treatment the last couple cycles. On exam there are no lesions present. No oral sores. He will try moisturizing for dryness and seeing if this helps.  No fever, chills, n/v, cough, rash, dizziness, SOB, chest pain, palpitations, abdominal pain or changes in bladder habits.  He has mild constipation after each cycle and takes Miralax as needed.  No swelling, tenderness, numbness or tingling in his extremities at this time. He will have the sensitivity to cold for several days after each treatment.  No c/o pain at this time.  No episodes of bleeding, no bruising or petechiae.  No lymphadenopathy noted on exam.  He has maintained a good appetite and is staying well hydrated. His weight is stable.   ECOG Performance Status: 1 - Symptomatic but completely ambulatory  Medications:  Allergies as of 03/03/2018   No Known Allergies     Medication List       Accurate as of March 03, 2018  8:15 AM. Always use your most recent med list.        dexamethasone 4 MG tablet Commonly known as:  DECADRON Take 2 tablets (8 mg total) by mouth daily. Start the day after chemotherapy for 2 days. Take with food.   DEXILANT 60 MG capsule Generic drug:  dexlansoprazole Take 60 mg by mouth daily.   lidocaine-prilocaine cream Commonly known as:  EMLA Apply to affected area once   ondansetron 8 MG tablet Commonly known as:  ZOFRAN Take 1 tablet (8 mg total) by mouth 2 (two) times daily as needed for refractory nausea / vomiting. Start on day 3 after chemotherapy.     polyethylene glycol packet Commonly known as:  MIRALAX / GLYCOLAX Take 17 g by mouth 2 (two) times daily.   prochlorperazine 10 MG tablet Commonly known as:  COMPAZINE Take 1 tablet (10 mg total) by mouth every 6 (six) hours as needed (Nausea or vomiting).       Allergies: No Known Allergies  Past Medical History, Surgical history, Social history, and Family History were reviewed and updated.  Review of Systems: All other 10 point review of systems is negative.   Physical Exam:  vitals were not taken for this visit.   Wt Readings from Last 3 Encounters:  02/17/18 175 lb (79.4 kg)  01/27/18 173 lb 1.3 oz (78.5 kg)  01/13/18 179 lb 6.4 oz (81.4 kg)    Ocular: Sclerae unicteric, pupils equal, round and reactive to light Ear-nose-throat: Oropharynx clear, dentition fair Lymphatic: No cervical, supraclavicular or axillary adenopathy Lungs no rales or rhonchi, good excursion bilaterally Heart regular rate and rhythm, no murmur appreciated Abd soft, nontender, positive bowel sounds, no liver or spleen tip palpated on exam, no fluid wave  MSK no focal spinal tenderness, no joint edema Neuro: non-focal, well-oriented, appropriate affect Breasts: Deferred   Lab Results  Component Value Date   WBC 2.1 (L) 02/17/2018   HGB 13.0 02/17/2018   HCT 41.9 02/17/2018   MCV 86.4 02/17/2018   PLT 169 02/17/2018   Lab Results  Component Value Date   FERRITIN 238 02/17/2018  IRON 61 02/17/2018   TIBC 335 02/17/2018   UIBC 274 02/17/2018   IRONPCTSAT 18 (L) 02/17/2018   Lab Results  Component Value Date   RBC 4.85 02/17/2018   No results found for: KPAFRELGTCHN, LAMBDASER, KAPLAMBRATIO No results found for: IGGSERUM, IGA, IGMSERUM No results found for: Odetta Pink, SPEI   Chemistry      Component Value Date/Time   NA 142 02/17/2018 1051   K 4.0 02/17/2018 1051   CL 106 02/17/2018 1051   CO2 28 02/17/2018 1051   BUN 7  02/17/2018 1051   CREATININE 0.77 02/17/2018 1051      Component Value Date/Time   CALCIUM 8.8 (L) 02/17/2018 1051   ALKPHOS 91 02/17/2018 1051   AST 31 02/17/2018 1051   ALT 29 02/17/2018 1051   BILITOT 0.3 02/17/2018 1051      [name] is a very pleasant 52 yo caucasian gentleman with metastatic adenocarcinoma of the GE junction, HER-2 positive.  He continues to do well and is tolerating treatment nicely. His platelet count is donw to 65 but he remains asymptomatic. We contacted Dr. Ammie Dalton regarding his lab work and will hold treatment today.  We will see him in another 2 weeks, after Christmas.  We will repeat a PET scan after cycle 8 and then determine if he can go to maintenance therapy.  They will contact our office with any questions or concerns. We can certainly see her sooner if need be.    Laverna Peace, NP 12/17/20198:15 AM

## 2018-03-04 ENCOUNTER — Inpatient Hospital Stay: Payer: 59

## 2018-03-05 ENCOUNTER — Inpatient Hospital Stay: Payer: 59

## 2018-03-06 ENCOUNTER — Inpatient Hospital Stay: Payer: 59

## 2018-03-17 ENCOUNTER — Other Ambulatory Visit: Payer: Self-pay | Admitting: Family

## 2018-03-17 ENCOUNTER — Inpatient Hospital Stay: Payer: 59

## 2018-03-17 ENCOUNTER — Encounter: Payer: Self-pay | Admitting: Hematology & Oncology

## 2018-03-17 ENCOUNTER — Other Ambulatory Visit: Payer: Self-pay

## 2018-03-17 ENCOUNTER — Inpatient Hospital Stay (HOSPITAL_BASED_OUTPATIENT_CLINIC_OR_DEPARTMENT_OTHER): Payer: 59 | Admitting: Hematology & Oncology

## 2018-03-17 VITALS — BP 117/83 | HR 72 | Resp 18 | Wt 181.0 lb

## 2018-03-17 DIAGNOSIS — D500 Iron deficiency anemia secondary to blood loss (chronic): Secondary | ICD-10-CM

## 2018-03-17 DIAGNOSIS — C799 Secondary malignant neoplasm of unspecified site: Principal | ICD-10-CM

## 2018-03-17 DIAGNOSIS — K5900 Constipation, unspecified: Secondary | ICD-10-CM

## 2018-03-17 DIAGNOSIS — C160 Malignant neoplasm of cardia: Secondary | ICD-10-CM

## 2018-03-17 DIAGNOSIS — Z79899 Other long term (current) drug therapy: Secondary | ICD-10-CM | POA: Diagnosis not present

## 2018-03-17 DIAGNOSIS — K921 Melena: Secondary | ICD-10-CM | POA: Diagnosis not present

## 2018-03-17 LAB — CBC WITH DIFFERENTIAL (CANCER CENTER ONLY)
Abs Immature Granulocytes: 0.05 10*3/uL (ref 0.00–0.07)
Basophils Absolute: 0 10*3/uL (ref 0.0–0.1)
Basophils Relative: 1 %
Eosinophils Absolute: 0.1 10*3/uL (ref 0.0–0.5)
Eosinophils Relative: 2 %
HCT: 43.1 % (ref 39.0–52.0)
Hemoglobin: 13.4 g/dL (ref 13.0–17.0)
Immature Granulocytes: 1 %
Lymphocytes Relative: 37 %
Lymphs Abs: 1.6 10*3/uL (ref 0.7–4.0)
MCH: 27 pg (ref 26.0–34.0)
MCHC: 31.1 g/dL (ref 30.0–36.0)
MCV: 86.7 fL (ref 80.0–100.0)
MONO ABS: 0.4 10*3/uL (ref 0.1–1.0)
MONOS PCT: 10 %
Neutro Abs: 2.1 10*3/uL (ref 1.7–7.7)
Neutrophils Relative %: 49 %
Platelet Count: 145 10*3/uL — ABNORMAL LOW (ref 150–400)
RBC: 4.97 MIL/uL (ref 4.22–5.81)
RDW: 17.2 % — ABNORMAL HIGH (ref 11.5–15.5)
WBC Count: 4.2 10*3/uL (ref 4.0–10.5)
nRBC: 0 % (ref 0.0–0.2)

## 2018-03-17 LAB — CMP (CANCER CENTER ONLY)
ALT: 27 U/L (ref 0–44)
AST: 29 U/L (ref 15–41)
Albumin: 3.7 g/dL (ref 3.5–5.0)
Alkaline Phosphatase: 88 U/L (ref 38–126)
Anion gap: 7 (ref 5–15)
BILIRUBIN TOTAL: 0.3 mg/dL (ref 0.3–1.2)
BUN: 10 mg/dL (ref 6–20)
CO2: 28 mmol/L (ref 22–32)
Calcium: 8.8 mg/dL — ABNORMAL LOW (ref 8.9–10.3)
Chloride: 108 mmol/L (ref 98–111)
Creatinine: 0.85 mg/dL (ref 0.61–1.24)
GFR, Est AFR Am: >60 mL/min (ref >60)
GFR, Estimated: >60 mL/min (ref >60)
Glucose, Bld: 122 mg/dL — ABNORMAL HIGH (ref 70–99)
Potassium: 3.6 mmol/L (ref 3.5–5.1)
Sodium: 143 mmol/L (ref 135–145)
TOTAL PROTEIN: 6.5 g/dL (ref 6.5–8.1)

## 2018-03-17 LAB — IRON AND TIBC
Iron: 52 ug/dL (ref 42–163)
Saturation Ratios: 15 % — ABNORMAL LOW (ref 20–55)
TIBC: 344 ug/dL (ref 202–409)
UIBC: 291 ug/dL (ref 117–376)

## 2018-03-17 LAB — LACTATE DEHYDROGENASE: LDH: 156 U/L (ref 98–192)

## 2018-03-17 LAB — FERRITIN: Ferritin: 21 ng/mL — ABNORMAL LOW (ref 24–336)

## 2018-03-17 LAB — CEA (IN HOUSE-CHCC): CEA (CHCC-In House): 1.79 ng/mL (ref 0.00–5.00)

## 2018-03-17 MED ORDER — PALONOSETRON HCL INJECTION 0.25 MG/5ML
INTRAVENOUS | Status: AC
  Filled 2018-03-17: qty 5

## 2018-03-17 MED ORDER — ACETAMINOPHEN 325 MG PO TABS
650.0000 mg | ORAL_TABLET | Freq: Once | ORAL | Status: AC
  Administered 2018-03-17: 650 mg via ORAL

## 2018-03-17 MED ORDER — SODIUM CHLORIDE 0.9 % IV SOLN
510.0000 mg | Freq: Once | INTRAVENOUS | Status: AC
  Administered 2018-03-17: 510 mg via INTRAVENOUS
  Filled 2018-03-17: qty 17

## 2018-03-17 MED ORDER — DIPHENHYDRAMINE HCL 25 MG PO CAPS
ORAL_CAPSULE | ORAL | Status: AC
  Filled 2018-03-17: qty 2

## 2018-03-17 MED ORDER — TRASTUZUMAB CHEMO 150 MG IV SOLR
4.0000 mg/kg | Freq: Once | INTRAVENOUS | Status: AC
  Administered 2018-03-17: 357 mg via INTRAVENOUS
  Filled 2018-03-17: qty 17

## 2018-03-17 MED ORDER — SODIUM CHLORIDE 0.9 % IV SOLN
2400.0000 mg/m2 | INTRAVENOUS | Status: DC
  Administered 2018-03-17: 5000 mg via INTRAVENOUS
  Filled 2018-03-17: qty 100

## 2018-03-17 MED ORDER — LEUCOVORIN CALCIUM INJECTION 350 MG
400.0000 mg/m2 | Freq: Once | INTRAVENOUS | Status: AC
  Administered 2018-03-17: 832 mg via INTRAVENOUS
  Filled 2018-03-17: qty 41.6

## 2018-03-17 MED ORDER — HEPARIN SOD (PORK) LOCK FLUSH 100 UNIT/ML IV SOLN
500.0000 [IU] | Freq: Once | INTRAVENOUS | Status: DC | PRN
  Filled 2018-03-17: qty 5

## 2018-03-17 MED ORDER — PALONOSETRON HCL INJECTION 0.25 MG/5ML
0.2500 mg | Freq: Once | INTRAVENOUS | Status: AC
  Administered 2018-03-17: 0.25 mg via INTRAVENOUS

## 2018-03-17 MED ORDER — DEXTROSE 5 % IV SOLN
Freq: Once | INTRAVENOUS | Status: AC
  Administered 2018-03-17: 11:00:00 via INTRAVENOUS
  Filled 2018-03-17: qty 250

## 2018-03-17 MED ORDER — FLUOROURACIL CHEMO INJECTION 2.5 GM/50ML
400.0000 mg/m2 | Freq: Once | INTRAVENOUS | Status: AC
  Administered 2018-03-17: 850 mg via INTRAVENOUS
  Filled 2018-03-17: qty 17

## 2018-03-17 MED ORDER — SODIUM CHLORIDE 0.9% FLUSH
10.0000 mL | INTRAVENOUS | Status: DC | PRN
  Filled 2018-03-17: qty 10

## 2018-03-17 MED ORDER — DEXAMETHASONE SODIUM PHOSPHATE 10 MG/ML IJ SOLN
INTRAMUSCULAR | Status: AC
  Filled 2018-03-17: qty 1

## 2018-03-17 MED ORDER — SODIUM CHLORIDE 0.9 % IV SOLN
Freq: Once | INTRAVENOUS | Status: AC
  Filled 2018-03-17: qty 250

## 2018-03-17 MED ORDER — DEXAMETHASONE SODIUM PHOSPHATE 10 MG/ML IJ SOLN
10.0000 mg | Freq: Once | INTRAMUSCULAR | Status: AC
  Administered 2018-03-17: 10 mg via INTRAVENOUS

## 2018-03-17 MED ORDER — ACETAMINOPHEN 325 MG PO TABS
ORAL_TABLET | ORAL | Status: AC
  Filled 2018-03-17: qty 2

## 2018-03-17 MED ORDER — DIPHENHYDRAMINE HCL 25 MG PO CAPS
50.0000 mg | ORAL_CAPSULE | Freq: Once | ORAL | Status: AC
  Administered 2018-03-17: 50 mg via ORAL

## 2018-03-17 MED ORDER — DIPHENHYDRAMINE HCL 25 MG PO CAPS
ORAL_CAPSULE | ORAL | Status: AC
  Filled 2018-03-17: qty 1

## 2018-03-17 MED ORDER — SODIUM CHLORIDE 0.9 % IV SOLN
Freq: Once | INTRAVENOUS | Status: AC
  Administered 2018-03-17: 10:00:00 via INTRAVENOUS
  Filled 2018-03-17: qty 250

## 2018-03-17 MED ORDER — OXALIPLATIN CHEMO INJECTION 100 MG/20ML
85.0000 mg/m2 | Freq: Once | INTRAVENOUS | Status: AC
  Administered 2018-03-17: 175 mg via INTRAVENOUS
  Filled 2018-03-17: qty 35

## 2018-03-17 NOTE — Progress Notes (Signed)
Hematology and Oncology Follow Up Visit  Matthew Reynolds [id number] 1965-05-27 52 y.o. 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  So far, he is doing incredibly well.  He had a wonderful Christmas.  He is pretty much eating what he would like to eat.  There is no problems with pain.  He has had no mouth sores.  He has had no change in bowel or bladder habits.  He has had no bleeding.  He has had no leg swelling.  He has had no issues with fever.  Again, he is come along way.  I have just so happy that his quality of life has been so good.  Overall, I would say his performance status is ECOG 1.   Medications:  Allergies as of 03/17/2018   No Known Allergies     Medication List       Accurate as of March 17, 2018  9:21 AM. Always use your most recent med list.        dexamethasone 4 MG tablet Commonly known as:  DECADRON Take 2 tablets (8 mg total) by mouth daily. Start the day after chemotherapy for 2 days. Take with food.   DEXILANT 60 MG capsule Generic drug:  dexlansoprazole Take 60 mg by mouth daily.   lidocaine-prilocaine cream Commonly known as:  EMLA Apply to affected area once   ondansetron 8 MG tablet Commonly known as:  ZOFRAN Take 1 tablet (8 mg total) by mouth 2 (two) times daily as needed for refractory nausea / vomiting. Start on day 3 after chemotherapy.   polyethylene glycol packet Commonly known as:  MIRALAX / GLYCOLAX Take 17 g by mouth 2 (two) times daily.   prochlorperazine 10 MG tablet Commonly known as:  COMPAZINE Take 1 tablet (10 mg total) by mouth every 6 (six) hours as needed (Nausea or vomiting).       Allergies: No Known Allergies  Past Medical History, Surgical history, Social history, and Family History were reviewed and updated.  Review of  Systems: Review of Systems  Constitutional: Negative.   HENT: Negative.   Eyes: Negative.   Respiratory: Negative.   Cardiovascular: Negative.   Gastrointestinal: Negative.   Genitourinary: Negative.   Musculoskeletal: Negative.   Skin: Negative.   Neurological: Negative.   Endo/Heme/Allergies: Negative.   Psychiatric/Behavioral: Negative.      Physical Exam:  weight is 181 lb (82.1 kg). His blood pressure is 117/83 and his pulse is 72. His respiration is 18 and oxygen saturation is 99%.   Wt Readings from Last 3 Encounters:  03/17/18 181 lb (82.1 kg)  03/03/18 177 lb (80.3 kg)  02/17/18 175 lb (79.4 kg)    Physical Exam Vitals signs reviewed.  HENT:     Head: Normocephalic and atraumatic.  Eyes:     Pupils: Pupils are equal, round, and reactive to light.  Neck:     Musculoskeletal: Normal range of motion.  Cardiovascular:     Rate and Rhythm: Normal rate and regular rhythm.     Heart sounds: Normal heart sounds.  Pulmonary:     Effort: Pulmonary effort is normal.     Breath sounds: Normal breath sounds.  Abdominal:     General: Bowel sounds are normal.     Palpations: Abdomen is soft.  Musculoskeletal: Normal range of motion.  General: No tenderness or deformity.  Lymphadenopathy:     Cervical: No cervical adenopathy.  Skin:    General: Skin is warm and dry.     Findings: No erythema or rash.  Neurological:     Mental Status: He is alert and oriented to person, place, and time.  Psychiatric:        Behavior: Behavior normal.        Thought Content: Thought content normal.        Judgment: Judgment normal.      Lab Results  Component Value Date   WBC 4.2 03/17/2018   HGB 13.4 03/17/2018   HCT 43.1 03/17/2018   MCV 86.7 03/17/2018   PLT 145 (L) 03/17/2018   Lab Results  Component Value Date   FERRITIN 156 03/03/2018   IRON 57 03/03/2018   TIBC 334 03/03/2018   UIBC 277 03/03/2018   IRONPCTSAT 17 (L) 03/03/2018   Lab Results  Component  Value Date   RBC 4.97 03/17/2018   No results found for: KPAFRELGTCHN, LAMBDASER, KAPLAMBRATIO No results found for: IGGSERUM, IGA, IGMSERUM No results found for: Odetta Pink, SPEI   Chemistry      Component Value Date/Time   NA 143 03/17/2018 0850   K 3.6 03/17/2018 0850   CL 108 03/17/2018 0850   CO2 28 03/17/2018 0850   BUN 10 03/17/2018 0850   CREATININE 0.85 03/17/2018 0850      Component Value Date/Time   CALCIUM 8.8 (L) 03/17/2018 0850   ALKPHOS 88 03/17/2018 0850   AST 29 03/17/2018 0850   ALT 27 03/17/2018 0850   BILITOT 0.3 03/17/2018 0850      [name] is a very pleasant 52 yo caucasian gentleman with metastatic adenocarcinoma of the GE junction, HER-2 positive.   He is doing incredibly well.  Again, everything is being driven by the fact that his cancer is HER-2 positive.  We will proceed with his sixth cycle of treatment.  I will do 8 cycles and then we will rescan and then think about some kind of maintenance therapy.  Given the fact that he has a HER-2 positive cancer, I would think that Herceptin is going to be critical with respect to maintenance therapy.  We will get her back in 2 weeks.  I know that he will have a very nice New Year's Eve.     [name], MD 12/31/20199:21 AM

## 2018-03-17 NOTE — Patient Instructions (Signed)
Ferumoxytol injection What is this medicine? FERUMOXYTOL is an iron complex. Iron is used to make healthy red blood cells, which carry oxygen and nutrients throughout the body. This medicine is used to treat iron deficiency anemia. This medicine may be used for other purposes; ask your health care provider or pharmacist if you have questions. COMMON BRAND NAME(S): Feraheme What should I tell my health care provider before I take this medicine? They need to know if you have any of these conditions: -anemia not caused by low iron levels -high levels of iron in the blood -magnetic resonance imaging (MRI) test scheduled -an unusual or allergic reaction to iron, other medicines, foods, dyes, or preservatives -pregnant or trying to get pregnant -breast-feeding How should I use this medicine? This medicine is for injection into a vein. It is given by a health care professional in a hospital or clinic setting. Talk to your pediatrician regarding the use of this medicine in children. Special care may be needed. Overdosage: If you think you have taken too much of this medicine contact a poison control center or emergency room at once. NOTE: This medicine is only for you. Do not share this medicine with others. What if I miss a dose? It is important not to miss your dose. Call your doctor or health care professional if you are unable to keep an appointment. What may interact with this medicine? This medicine may interact with the following medications: -other iron products This list may not describe all possible interactions. Give your health care provider a list of all the medicines, herbs, non-prescription drugs, or dietary supplements you use. Also tell them if you smoke, drink alcohol, or use illegal drugs. Some items may interact with your medicine. What should I watch for while using this medicine? Visit your doctor or healthcare professional regularly. Tell your doctor or healthcare professional  if your symptoms do not start to get better or if they get worse. You may need blood work done while you are taking this medicine. You may need to follow a special diet. Talk to your doctor. Foods that contain iron include: whole grains/cereals, dried fruits, beans, or peas, leafy green vegetables, and organ meats (liver, kidney). What side effects may I notice from receiving this medicine? Side effects that you should report to your doctor or health care professional as soon as possible: -allergic reactions like skin rash, itching or hives, swelling of the face, lips, or tongue -breathing problems -changes in blood pressure -feeling faint or lightheaded, falls -fever or chills -flushing, sweating, or hot feelings -swelling of the ankles or feet Side effects that usually do not require medical attention (report to your doctor or health care professional if they continue or are bothersome): -diarrhea -headache -nausea, vomiting -stomach pain This list may not describe all possible side effects. Call your doctor for medical advice about side effects. You may report side effects to FDA at 1-800-FDA-1088. Where should I keep my medicine? This drug is given in a hospital or clinic and will not be stored at home. NOTE: This sheet is a summary. It may not cover all possible information. If you have questions about this medicine, talk to your doctor, pharmacist, or health care provider.  2019 Else[employer]/Gold Standard (2016-04-22 20:21:10) Fluorouracil, 5-FU injection What is this medicine? FLUOROURACIL, 5-FU (flure oh YOOR a sil) is a chemotherapy drug. It slows the growth of cancer cells. This medicine is used to treat many types of cancer like breast cancer, colon or rectal cancer,  pancreatic cancer, and stomach cancer. This medicine may be used for other purposes; ask your health care provider or pharmacist if you have questions. COMMON BRAND NAME(S): Adrucil What should I tell my health care  provider before I take this medicine? They need to know if you have any of these conditions: -blood disorders -dihydropyrimidine dehydrogenase (DPD) deficiency -infection (especially a virus infection such as chickenpox, cold sores, or herpes) -kidney disease -liver disease -malnourished, poor nutrition -recent or ongoing radiation therapy -an unusual or allergic reaction to fluorouracil, other chemotherapy, other medicines, foods, dyes, or preservatives -pregnant or trying to get pregnant -breast-feeding How should I use this medicine? This drug is given as an infusion or injection into a vein. It is administered in a hospital or clinic by a specially trained health care professional. Talk to your pediatrician regarding the use of this medicine in children. Special care may be needed. Overdosage: If you think you have taken too much of this medicine contact a poison control center or emergency room at once. NOTE: This medicine is only for you. Do not share this medicine with others. What if I miss a dose? It is important not to miss your dose. Call your doctor or health care professional if you are unable to keep an appointment. What may interact with this medicine? -allopurinol -cimetidine -dapsone -digoxin -hydroxyurea -leucovorin -levamisole -medicines for seizures like ethotoin, fosphenytoin, phenytoin -medicines to increase blood counts like filgrastim, pegfilgrastim, sargramostim -medicines that treat or prevent blood clots like warfarin, enoxaparin, and dalteparin -methotrexate -metronidazole -pyrimethamine -some other chemotherapy drugs like busulfan, cisplatin, estramustine, vinblastine -trimethoprim -trimetrexate -vaccines Talk to your doctor or health care professional before taking any of these medicines: -acetaminophen -aspirin -ibuprofen -ketoprofen -naproxen This list may not describe all possible interactions. Give your health care provider a list of all  the medicines, herbs, non-prescription drugs, or dietary supplements you use. Also tell them if you smoke, drink alcohol, or use illegal drugs. Some items may interact with your medicine. What should I watch for while using this medicine? Visit your doctor for checks on your progress. This drug may make you feel generally unwell. This is not uncommon, as chemotherapy can affect healthy cells as well as cancer cells. Report any side effects. Continue your course of treatment even though you feel ill unless your doctor tells you to stop. In some cases, you may be given additional medicines to help with side effects. Follow all directions for their use. Call your doctor or health care professional for advice if you get a fever, chills or sore throat, or other symptoms of a cold or flu. Do not treat yourself. This drug decreases your body's ability to fight infections. Try to avoid being around people who are sick. This medicine may increase your risk to bruise or bleed. Call your doctor or health care professional if you notice any unusual bleeding. Be careful brushing and flossing your teeth or using a toothpick because you may get an infection or bleed more easily. If you have any dental work done, tell your dentist you are receiving this medicine. Avoid taking products that contain aspirin, acetaminophen, ibuprofen, naproxen, or ketoprofen unless instructed by your doctor. These medicines may hide a fever. Do not become pregnant while taking this medicine. Women should inform their doctor if they wish to become pregnant or think they might be pregnant. There is a potential for serious side effects to an unborn child. Talk to your health care professional or pharmacist for more information.  Do not breast-feed an infant while taking this medicine. Men should inform their doctor if they wish to father a child. This medicine may lower sperm counts. Do not treat diarrhea with over the counter products. Contact  your doctor if you have diarrhea that lasts more than 2 days or if it is severe and watery. This medicine can make you more sensitive to the sun. Keep out of the sun. If you cannot avoid being in the sun, wear protective clothing and use sunscreen. Do not use sun lamps or tanning beds/booths. What side effects may I notice from receiving this medicine? Side effects that you should report to your doctor or health care professional as soon as possible: -allergic reactions like skin rash, itching or hives, swelling of the face, lips, or tongue -low blood counts - this medicine may decrease the number of white blood cells, red blood cells and platelets. You may be at increased risk for infections and bleeding. -signs of infection - fever or chills, cough, sore throat, pain or difficulty passing urine -signs of decreased platelets or bleeding - bruising, pinpoint red spots on the skin, black, tarry stools, blood in the urine -signs of decreased red blood cells - unusually weak or tired, fainting spells, lightheadedness -breathing problems -changes in vision -chest pain -mouth sores -nausea and vomiting -pain, swelling, redness at site where injected -pain, tingling, numbness in the hands or feet -redness, swelling, or sores on hands or feet -stomach pain -unusual bleeding Side effects that usually do not require medical attention (report to your doctor or health care professional if they continue or are bothersome): -changes in finger or toe nails -diarrhea -dry or itchy skin -hair loss -headache -loss of appetite -sensitivity of eyes to the light -stomach upset -unusually teary eyes This list may not describe all possible side effects. Call your doctor for medical advice about side effects. You may report side effects to FDA at 1-800-FDA-1088. Where should I keep my medicine? This drug is given in a hospital or clinic and will not be stored at home. NOTE: This sheet is a summary. It may  not cover all possible information. If you have questions about this medicine, talk to your doctor, pharmacist, or health care provider.  2019 Elsevier/Gold Standard (2007-07-08 13:53:16) Trastuzumab injection for infusion What is this medicine? TRASTUZUMAB (tras TOO zoo mab) is a monoclonal antibody. It is used to treat breast cancer and stomach cancer. This medicine may be used for other purposes; ask your health care provider or pharmacist if you have questions. COMMON BRAND NAME(S): Herceptin, Calla Kicks, OGIVRI What should I tell my health care provider before I take this medicine? They need to know if you have any of these conditions: -heart disease -heart failure -lung or breathing disease, like asthma -an unusual or allergic reaction to trastuzumab, benzyl alcohol, or other medications, foods, dyes, or preservatives -pregnant or trying to get pregnant -breast-feeding How should I use this medicine? This drug is given as an infusion into a vein. It is administered in a hospital or clinic by a specially trained health care professional. Talk to your pediatrician regarding the use of this medicine in children. This medicine is not approved for use in children. Overdosage: If you think you have taken too much of this medicine contact a poison control center or emergency room at once. NOTE: This medicine is only for you. Do not share this medicine with others. What if I miss a dose? It is important not to miss a  dose. Call your doctor or health care professional if you are unable to keep an appointment. What may interact with this medicine? This medicine may interact with the following medications: -certain types of chemotherapy, such as daunorubicin, doxorubicin, epirubicin, and idarubicin This list may not describe all possible interactions. Give your health care provider a list of all the medicines, herbs, non-prescription drugs, or dietary supplements you use. Also tell them if you  smoke, drink alcohol, or use illegal drugs. Some items may interact with your medicine. What should I watch for while using this medicine? Visit your doctor for checks on your progress. Report any side effects. Continue your course of treatment even though you feel ill unless your doctor tells you to stop. Call your doctor or health care professional for advice if you get a fever, chills or sore throat, or other symptoms of a cold or flu. Do not treat yourself. Try to avoid being around people who are sick. You may experience fever, chills and shaking during your first infusion. These effects are usually mild and can be treated with other medicines. Report any side effects during the infusion to your health care professional. Fever and chills usually do not happen with later infusions. Do not become pregnant while taking this medicine or for 7 months after stopping it. Women should inform their doctor if they wish to become pregnant or think they might be pregnant. Women of child-bearing potential will need to have a negative pregnancy test before starting this medicine. There is a potential for serious side effects to an unborn child. Talk to your health care professional or pharmacist for more information. Do not breast-feed an infant while taking this medicine or for 7 months after stopping it. Women must use effective birth control with this medicine. What side effects may I notice from receiving this medicine? Side effects that you should report to your doctor or health care professional as soon as possible: -allergic reactions like skin rash, itching or hives, swelling of the face, lips, or tongue -chest pain or palpitations -cough -dizziness -feeling faint or lightheaded, falls -fever -general ill feeling or flu-like symptoms -signs of worsening heart failure like breathing problems; swelling in your legs and feet -unusually weak or tired Side effects that usually do not require medical  attention (report to your doctor or health care professional if they continue or are bothersome): -bone pain -changes in taste -diarrhea -joint pain -nausea/vomiting -weight loss This list may not describe all possible side effects. Call your doctor for medical advice about side effects. You may report side effects to FDA at 1-800-FDA-1088. Where should I keep my medicine? This drug is given in a hospital or clinic and will not be stored at home. NOTE: This sheet is a summary. It may not cover all possible information. If you have questions about this medicine, talk to your doctor, pharmacist, or health care provider.  2019 Else[employer] Standard (2016-02-27 14:37:52) Leucovorin injection What is this medicine? LEUCOVORIN (loo koe VOR in) is used to prevent or treat the harmful effects of some medicines. This medicine is used to treat anemia caused by a low amount of folic acid in the body. It is also used with 5-fluorouracil (5-FU) to treat colon cancer. This medicine may be used for other purposes; ask your health care provider or pharmacist if you have questions. What should I tell my health care provider before I take this medicine? They need to know if you have any of these conditions: -anemia from  low levels of vitamin B-12 in the blood -an unusual or allergic reaction to leucovorin, folic acid, other medicines, foods, dyes, or preservatives -pregnant or trying to get pregnant -breast-feeding How should I use this medicine? This medicine is for injection into a muscle or into a vein. It is given by a health care professional in a hospital or clinic setting. Talk to your pediatrician regarding the use of this medicine in children. Special care may be needed. Overdosage: If you think you have taken too much of this medicine contact a poison control center or emergency room at once. NOTE: This medicine is only for you. Do not share this medicine with others. What if I miss a  dose? This does not apply. What may interact with this medicine? -capecitabine -fluorouracil -phenobarbital -phenytoin -primidone -trimethoprim-sulfamethoxazole This list may not describe all possible interactions. Give your health care provider a list of all the medicines, herbs, non-prescription drugs, or dietary supplements you use. Also tell them if you smoke, drink alcohol, or use illegal drugs. Some items may interact with your medicine. What should I watch for while using this medicine? Your condition will be monitored carefully while you are receiving this medicine. This medicine may increase the side effects of 5-fluorouracil, 5-FU. Tell your doctor or health care professional if you have diarrhea or mouth sores that do not get better or that get worse. What side effects may I notice from receiving this medicine? Side effects that you should report to your doctor or health care professional as soon as possible: -allergic reactions like skin rash, itching or hives, swelling of the face, lips, or tongue -breathing problems -fever, infection -mouth sores -unusual bleeding or bruising -unusually weak or tired Side effects that usually do not require medical attention (report to your doctor or health care professional if they continue or are bothersome): -constipation or diarrhea -loss of appetite -nausea, vomiting This list may not describe all possible side effects. Call your doctor for medical advice about side effects. You may report side effects to FDA at 1-800-FDA-1088. Where should I keep my medicine? This drug is given in a hospital or clinic and will not be stored at home. NOTE: This sheet is a summary. It may not cover all possible information. If you have questions about this medicine, talk to your doctor, pharmacist, or health care provider.  2019 Elsevier/Gold Standard (2007-09-08 16:50:29) Oxaliplatin Injection What is this medicine? OXALIPLATIN (ox AL i PLA tin) is  a chemotherapy drug. It targets fast dividing cells, like cancer cells, and causes these cells to die. This medicine is used to treat cancers of the colon and rectum, and many other cancers. This medicine may be used for other purposes; ask your health care provider or pharmacist if you have questions. COMMON BRAND NAME(S): Eloxatin What should I tell my health care provider before I take this medicine? They need to know if you have any of these conditions: -kidney disease -an unusual or allergic reaction to oxaliplatin, other chemotherapy, other medicines, foods, dyes, or preservatives -pregnant or trying to get pregnant -breast-feeding How should I use this medicine? This drug is given as an infusion into a vein. It is administered in a hospital or clinic by a specially trained health care professional. Talk to your pediatrician regarding the use of this medicine in children. Special care may be needed. Overdosage: If you think you have taken too much of this medicine contact a poison control center or emergency room at once. NOTE: This medicine  is only for you. Do not share this medicine with others. What if I miss a dose? It is important not to miss a dose. Call your doctor or health care professional if you are unable to keep an appointment. What may interact with this medicine? -medicines to increase blood counts like filgrastim, pegfilgrastim, sargramostim -probenecid -some antibiotics like amikacin, gentamicin, neomycin, polymyxin B, streptomycin, tobramycin -zalcitabine Talk to your doctor or health care professional before taking any of these medicines: -acetaminophen -aspirin -ibuprofen -ketoprofen -naproxen This list may not describe all possible interactions. Give your health care provider a list of all the medicines, herbs, non-prescription drugs, or dietary supplements you use. Also tell them if you smoke, drink alcohol, or use illegal drugs. Some items may interact with  your medicine. What should I watch for while using this medicine? Your condition will be monitored carefully while you are receiving this medicine. You will need important blood work done while you are taking this medicine. This medicine can make you more sensitive to cold. Do not drink cold drinks or use ice. Cover exposed skin before coming in contact with cold temperatures or cold objects. When out in cold weather wear warm clothing and cover your mouth and nose to warm the air that goes into your lungs. Tell your doctor if you get sensitive to the cold. This drug may make you feel generally unwell. This is not uncommon, as chemotherapy can affect healthy cells as well as cancer cells. Report any side effects. Continue your course of treatment even though you feel ill unless your doctor tells you to stop. In some cases, you may be given additional medicines to help with side effects. Follow all directions for their use. Call your doctor or health care professional for advice if you get a fever, chills or sore throat, or other symptoms of a cold or flu. Do not treat yourself. This drug decreases your body's ability to fight infections. Try to avoid being around people who are sick. This medicine may increase your risk to bruise or bleed. Call your doctor or health care professional if you notice any unusual bleeding. Be careful brushing and flossing your teeth or using a toothpick because you may get an infection or bleed more easily. If you have any dental work done, tell your dentist you are receiving this medicine. Avoid taking products that contain aspirin, acetaminophen, ibuprofen, naproxen, or ketoprofen unless instructed by your doctor. These medicines may hide a fever. Do not become pregnant while taking this medicine. Women should inform their doctor if they wish to become pregnant or think they might be pregnant. There is a potential for serious side effects to an unborn child. Talk to your  health care professional or pharmacist for more information. Do not breast-feed an infant while taking this medicine. Call your doctor or health care professional if you get diarrhea. Do not treat yourself. What side effects may I notice from receiving this medicine? Side effects that you should report to your doctor or health care professional as soon as possible: -allergic reactions like skin rash, itching or hives, swelling of the face, lips, or tongue -low blood counts - This drug may decrease the number of white blood cells, red blood cells and platelets. You may be at increased risk for infections and bleeding. -signs of infection - fever or chills, cough, sore throat, pain or difficulty passing urine -signs of decreased platelets or bleeding - bruising, pinpoint red spots on the skin, black, tarry stools, nosebleeds -  signs of decreased red blood cells - unusually weak or tired, fainting spells, lightheadedness -breathing problems -chest pain, pressure -cough -diarrhea -jaw tightness -mouth sores -nausea and vomiting -pain, swelling, redness or irritation at the injection site -pain, tingling, numbness in the hands or feet -problems with balance, talking, walking -redness, blistering, peeling or loosening of the skin, including inside the mouth -trouble passing urine or change in the amount of urine Side effects that usually do not require medical attention (report to your doctor or health care professional if they continue or are bothersome): -changes in vision -constipation -hair loss -loss of appetite -metallic taste in the mouth or changes in taste -stomach pain This list may not describe all possible side effects. Call your doctor for medical advice about side effects. You may report side effects to FDA at 1-800-FDA-1088. Where should I keep my medicine? This drug is given in a hospital or clinic and will not be stored at home. NOTE: This sheet is a summary. It may not cover  all possible information. If you have questions about this medicine, talk to your doctor, pharmacist, or health care provider.  2019 Elsevier/Gold Standard (2007-09-29 17:22:47)

## 2018-03-17 NOTE — Patient Instructions (Signed)
Implanted Port Insertion, Care After  This sheet gives you information about how to care for yourself after your procedure. Your health care provider may also give you more specific instructions. If you have problems or questions, contact your health care provider.  What can I expect after the procedure?  After the procedure, it is common to have:  · Discomfort at the port insertion site.  · Bruising on the skin over the port. This should improve over 3-4 days.  Follow these instructions at home:  Port care  · After your port is placed, you will get a manufacturer's information card. The card has information about your port. Keep this card with you at all times.  · Take care of the port as told by your health care provider. Ask your health care provider if you or a family member can get training for taking care of the port at home. A home health care nurse may also take care of the port.  · Make sure to remember what type of port you have.  Incision care         · Follow instructions from your health care provider about how to take care of your port insertion site. Make sure you:  ? Wash your hands with soap and water before and after you change your bandage (dressing). If soap and water are not available, use hand sanitizer.  ? Change your dressing as told by your health care provider.  ? Leave stitches (sutures), skin glue, or adhesive strips in place. These skin closures may need to stay in place for 2 weeks or longer. If adhesive strip edges start to loosen and curl up, you may trim the loose edges. Do not remove adhesive strips completely unless your health care provider tells you to do that.  · Check your port insertion site every day for signs of infection. Check for:  ? Redness, swelling, or pain.  ? Fluid or blood.  ? Warmth.  ? Pus or a bad smell.  Activity  · Return to your normal activities as told by your health care provider. Ask your health care provider what activities are safe for you.  · Do not  lift anything that is heavier than 10 lb (4.5 kg), or the limit that you are told, until your health care provider says that it is safe.  General instructions  · Take over-the-counter and prescription medicines only as told by your health care provider.  · Do not take baths, swim, or use a hot tub until your health care provider approves. Ask your health care provider if you may take showers. You may only be allowed to take sponge baths.  · Do not drive for 24 hours if you were given a sedative during your procedure.  · Wear a medical alert bracelet in case of an emergency. This will tell any health care providers that you have a port.  · Keep all follow-up visits as told by your health care provider. This is important.  Contact a health care provider if:  · You cannot flush your port with saline as directed, or you cannot draw blood from the port.  · You have a fever or chills.  · You have redness, swelling, or pain around your port insertion site.  · You have fluid or blood coming from your port insertion site.  · Your port insertion site feels warm to the touch.  · You have pus or a bad smell coming from the port   insertion site.  Get help right away if:  · You have chest pain or shortness of breath.  · You have bleeding from your port that you cannot control.  Summary  · Take care of the port as told by your health care provider. Keep the manufacturer's information card with you at all times.  · Change your dressing as told by your health care provider.  · Contact a health care provider if you have a fever or chills or if you have redness, swelling, or pain around your port insertion site.  · Keep all follow-up visits as told by your health care provider.  This information is not intended to replace advice given to you by your health care provider. Make sure you discuss any questions you have with your health care provider.  Document Released: 12/23/2012 Document Revised: 09/30/2017 Document Reviewed:  09/30/2017  [employer] Interactive Patient Education © 2019 [employer]

## 2018-03-19 ENCOUNTER — Inpatient Hospital Stay: Payer: 59 | Attending: Hematology & Oncology

## 2018-03-19 VITALS — BP 124/75 | HR 59 | Temp 97.8°F | Resp 20

## 2018-03-19 DIAGNOSIS — Z5112 Encounter for antineoplastic immunotherapy: Secondary | ICD-10-CM | POA: Insufficient documentation

## 2018-03-19 DIAGNOSIS — Z5111 Encounter for antineoplastic chemotherapy: Secondary | ICD-10-CM | POA: Insufficient documentation

## 2018-03-19 DIAGNOSIS — C799 Secondary malignant neoplasm of unspecified site: Secondary | ICD-10-CM

## 2018-03-19 DIAGNOSIS — C160 Malignant neoplasm of cardia: Secondary | ICD-10-CM

## 2018-03-19 MED ORDER — SODIUM CHLORIDE 0.9% FLUSH
10.0000 mL | INTRAVENOUS | Status: DC | PRN
  Administered 2018-03-19: 10 mL
  Filled 2018-03-19: qty 10

## 2018-03-19 MED ORDER — HEPARIN SOD (PORK) LOCK FLUSH 100 UNIT/ML IV SOLN
500.0000 [IU] | Freq: Once | INTRAVENOUS | Status: AC | PRN
  Administered 2018-03-19: 500 [IU]
  Filled 2018-03-19: qty 5

## 2018-03-23 ENCOUNTER — Ambulatory Visit: Payer: 59 | Admitting: Family

## 2018-03-23 ENCOUNTER — Other Ambulatory Visit: Payer: 59

## 2018-03-23 ENCOUNTER — Ambulatory Visit: Payer: 59

## 2018-03-24 ENCOUNTER — Other Ambulatory Visit: Payer: 59

## 2018-03-24 ENCOUNTER — Ambulatory Visit: Payer: 59

## 2018-03-31 ENCOUNTER — Inpatient Hospital Stay: Payer: 59

## 2018-03-31 ENCOUNTER — Telehealth: Payer: Self-pay | Admitting: *Deleted

## 2018-03-31 DIAGNOSIS — Z95828 Presence of other vascular implants and grafts: Secondary | ICD-10-CM

## 2018-03-31 DIAGNOSIS — C160 Malignant neoplasm of cardia: Secondary | ICD-10-CM | POA: Diagnosis not present

## 2018-03-31 DIAGNOSIS — C799 Secondary malignant neoplasm of unspecified site: Principal | ICD-10-CM

## 2018-03-31 LAB — CBC WITH DIFFERENTIAL (CANCER CENTER ONLY)
Abs Immature Granulocytes: 0 10*3/uL (ref 0.00–0.07)
Basophils Absolute: 0 10*3/uL (ref 0.0–0.1)
Basophils Relative: 1 %
Eosinophils Absolute: 0.1 10*3/uL (ref 0.0–0.5)
Eosinophils Relative: 3 %
HCT: 41.9 % (ref 39.0–52.0)
Hemoglobin: 13.7 g/dL (ref 13.0–17.0)
Immature Granulocytes: 0 %
Lymphocytes Relative: 38 %
Lymphs Abs: 1.1 10*3/uL (ref 0.7–4.0)
MCH: 27.8 pg (ref 26.0–34.0)
MCHC: 32.7 g/dL (ref 30.0–36.0)
MCV: 85.2 fL (ref 80.0–100.0)
Monocytes Absolute: 0.3 10*3/uL (ref 0.1–1.0)
Monocytes Relative: 10 %
Neutro Abs: 1.5 10*3/uL — ABNORMAL LOW (ref 1.7–7.7)
Neutrophils Relative %: 48 %
PLATELETS: 54 10*3/uL — AB (ref 150–400)
RBC: 4.92 MIL/uL (ref 4.22–5.81)
RDW: 17.9 % — ABNORMAL HIGH (ref 11.5–15.5)
WBC: 3 10*3/uL — AB (ref 4.0–10.5)
nRBC: 0 % (ref 0.0–0.2)

## 2018-03-31 LAB — CMP (CANCER CENTER ONLY)
ALT: 34 U/L (ref 0–44)
AST: 31 U/L (ref 15–41)
Albumin: 3.7 g/dL (ref 3.5–5.0)
Alkaline Phosphatase: 96 U/L (ref 38–126)
Anion gap: 8 (ref 5–15)
BUN: 10 mg/dL (ref 6–20)
CO2: 27 mmol/L (ref 22–32)
Calcium: 8.8 mg/dL — ABNORMAL LOW (ref 8.9–10.3)
Chloride: 107 mmol/L (ref 98–111)
Creatinine: 0.92 mg/dL (ref 0.61–1.24)
GFR, Estimated: >60 mL/min (ref >60)
Glucose, Bld: 113 mg/dL — ABNORMAL HIGH (ref 70–99)
Potassium: 3.6 mmol/L (ref 3.5–5.1)
SODIUM: 142 mmol/L (ref 135–145)
Total Bilirubin: 0.3 mg/dL (ref 0.3–1.2)
Total Protein: 6.2 g/dL — ABNORMAL LOW (ref 6.5–8.1)

## 2018-03-31 MED ORDER — HEPARIN SOD (PORK) LOCK FLUSH 100 UNIT/ML IV SOLN
500.0000 [IU] | Freq: Once | INTRAVENOUS | Status: AC
  Administered 2018-03-31: 500 [IU] via INTRAVENOUS
  Filled 2018-03-31: qty 5

## 2018-03-31 MED ORDER — SODIUM CHLORIDE 0.9% FLUSH
10.0000 mL | INTRAVENOUS | Status: AC | PRN
  Administered 2018-03-31: 10 mL via INTRAVENOUS
  Filled 2018-03-31: qty 10

## 2018-03-31 NOTE — Telephone Encounter (Signed)
Richardson Landry, in lab, informed me of a critical lab value. Platelets are 54. Will inform MD and infusion nurse.

## 2018-03-31 NOTE — Addendum Note (Signed)
Addended by: San Morelle on: 03/31/2018 11:21 AM   Modules accepted: Orders, SmartSet

## 2018-03-31 NOTE — Progress Notes (Signed)
Hold chemo today per Dr. Maylon Peppers for platelets of 54.  Pt to reschedule in one week.

## 2018-03-31 NOTE — Patient Instructions (Signed)
Implanted Port Insertion, Care After  This sheet gives you information about how to care for yourself after your procedure. Your health care provider may also give you more specific instructions. If you have problems or questions, contact your health care provider.  What can I expect after the procedure?  After the procedure, it is common to have:  · Discomfort at the port insertion site.  · Bruising on the skin over the port. This should improve over 3-4 days.  Follow these instructions at home:  Port care  · After your port is placed, you will get a manufacturer's information card. The card has information about your port. Keep this card with you at all times.  · Take care of the port as told by your health care provider. Ask your health care provider if you or a family member can get training for taking care of the port at home. A home health care nurse may also take care of the port.  · Make sure to remember what type of port you have.  Incision care         · Follow instructions from your health care provider about how to take care of your port insertion site. Make sure you:  ? Wash your hands with soap and water before and after you change your bandage (dressing). If soap and water are not available, use hand sanitizer.  ? Change your dressing as told by your health care provider.  ? Leave stitches (sutures), skin glue, or adhesive strips in place. These skin closures may need to stay in place for 2 weeks or longer. If adhesive strip edges start to loosen and curl up, you may trim the loose edges. Do not remove adhesive strips completely unless your health care provider tells you to do that.  · Check your port insertion site every day for signs of infection. Check for:  ? Redness, swelling, or pain.  ? Fluid or blood.  ? Warmth.  ? Pus or a bad smell.  Activity  · Return to your normal activities as told by your health care provider. Ask your health care provider what activities are safe for you.  · Do not  lift anything that is heavier than 10 lb (4.5 kg), or the limit that you are told, until your health care provider says that it is safe.  General instructions  · Take over-the-counter and prescription medicines only as told by your health care provider.  · Do not take baths, swim, or use a hot tub until your health care provider approves. Ask your health care provider if you may take showers. You may only be allowed to take sponge baths.  · Do not drive for 24 hours if you were given a sedative during your procedure.  · Wear a medical alert bracelet in case of an emergency. This will tell any health care providers that you have a port.  · Keep all follow-up visits as told by your health care provider. This is important.  Contact a health care provider if:  · You cannot flush your port with saline as directed, or you cannot draw blood from the port.  · You have a fever or chills.  · You have redness, swelling, or pain around your port insertion site.  · You have fluid or blood coming from your port insertion site.  · Your port insertion site feels warm to the touch.  · You have pus or a bad smell coming from the port   insertion site.  Get help right away if:  · You have chest pain or shortness of breath.  · You have bleeding from your port that you cannot control.  Summary  · Take care of the port as told by your health care provider. Keep the manufacturer's information card with you at all times.  · Change your dressing as told by your health care provider.  · Contact a health care provider if you have a fever or chills or if you have redness, swelling, or pain around your port insertion site.  · Keep all follow-up visits as told by your health care provider.  This information is not intended to replace advice given to you by your health care provider. Make sure you discuss any questions you have with your health care provider.  Document Released: 12/23/2012 Document Revised: 09/30/2017 Document Reviewed:  09/30/2017  [employer] Interactive Patient Education © 2019 [employer]

## 2018-04-07 ENCOUNTER — Other Ambulatory Visit: Payer: 59

## 2018-04-07 ENCOUNTER — Inpatient Hospital Stay: Payer: 59

## 2018-04-07 ENCOUNTER — Telehealth: Payer: Self-pay | Admitting: *Deleted

## 2018-04-07 ENCOUNTER — Ambulatory Visit: Payer: 59 | Admitting: Hematology & Oncology

## 2018-04-07 ENCOUNTER — Ambulatory Visit: Payer: 59

## 2018-04-07 ENCOUNTER — Other Ambulatory Visit: Payer: Self-pay

## 2018-04-07 DIAGNOSIS — C160 Malignant neoplasm of cardia: Secondary | ICD-10-CM

## 2018-04-07 DIAGNOSIS — C799 Secondary malignant neoplasm of unspecified site: Principal | ICD-10-CM

## 2018-04-07 LAB — CMP (CANCER CENTER ONLY)
ALT: 32 U/L (ref 0–44)
AST: 32 U/L (ref 15–41)
Albumin: 3.8 g/dL (ref 3.5–5.0)
Alkaline Phosphatase: 96 U/L (ref 38–126)
Anion gap: 8 (ref 5–15)
BUN: 10 mg/dL (ref 6–20)
CO2: 28 mmol/L (ref 22–32)
Calcium: 9.3 mg/dL (ref 8.9–10.3)
Chloride: 106 mmol/L (ref 98–111)
Creatinine: 0.88 mg/dL (ref 0.61–1.24)
GFR, Est AFR Am: >60 mL/min (ref >60)
GFR, Estimated: >60 mL/min (ref >60)
GLUCOSE: 103 mg/dL — AB (ref 70–99)
Potassium: 3.7 mmol/L (ref 3.5–5.1)
Sodium: 142 mmol/L (ref 135–145)
Total Bilirubin: 0.4 mg/dL (ref 0.3–1.2)
Total Protein: 6.1 g/dL — ABNORMAL LOW (ref 6.5–8.1)

## 2018-04-07 LAB — CBC WITH DIFFERENTIAL (CANCER CENTER ONLY)
Band Neutrophils: 0 %
Basophils Absolute: 0.1 10*3/uL (ref 0.0–0.1)
Basophils Relative: 1 %
Eosinophils Absolute: 0.1 10*3/uL (ref 0.0–0.5)
Eosinophils Relative: 3 %
HCT: 42.8 % (ref 39.0–52.0)
Hemoglobin: 13.9 g/dL (ref 13.0–17.0)
LYMPHS ABS: 1.3 10*3/uL (ref 0.7–4.0)
Lymphocytes Relative: 56 %
MCH: 27.8 pg (ref 26.0–34.0)
MCHC: 32.5 g/dL (ref 30.0–36.0)
MCV: 85.6 fL (ref 80.0–100.0)
MONOS PCT: 20 %
Monocytes Absolute: 0.5 10*3/uL (ref 0.1–1.0)
Neutro Abs: 0.4 10*3/uL — CL (ref 1.7–7.7)
Neutrophils Relative %: 20 %
Platelet Count: 156 10*3/uL (ref 150–400)
RBC: 5 MIL/uL (ref 4.22–5.81)
RDW: 17.5 % — ABNORMAL HIGH (ref 11.5–15.5)
WBC Count: 2.3 10*3/uL — ABNORMAL LOW (ref 4.0–10.5)
nRBC: 0 % (ref 0.0–0.2)

## 2018-04-07 MED ORDER — PALONOSETRON HCL INJECTION 0.25 MG/5ML
INTRAVENOUS | Status: AC
  Filled 2018-04-07: qty 5

## 2018-04-07 MED ORDER — ACETAMINOPHEN 325 MG PO TABS
ORAL_TABLET | ORAL | Status: AC
  Filled 2018-04-07: qty 2

## 2018-04-07 MED ORDER — DEXAMETHASONE SODIUM PHOSPHATE 10 MG/ML IJ SOLN
INTRAMUSCULAR | Status: AC
  Filled 2018-04-07: qty 1

## 2018-04-07 MED ORDER — HEPARIN SOD (PORK) LOCK FLUSH 100 UNIT/ML IV SOLN
500.0000 [IU] | Freq: Once | INTRAVENOUS | Status: AC
  Administered 2018-04-07: 500 [IU] via INTRAVENOUS
  Filled 2018-04-07: qty 5

## 2018-04-07 MED ORDER — SODIUM CHLORIDE 0.9% FLUSH
10.0000 mL | INTRAVENOUS | Status: DC | PRN
  Administered 2018-04-07: 10 mL via INTRAVENOUS
  Filled 2018-04-07: qty 10

## 2018-04-07 MED ORDER — DIPHENHYDRAMINE HCL 25 MG PO CAPS
ORAL_CAPSULE | ORAL | Status: AC
  Filled 2018-04-07: qty 2

## 2018-04-07 NOTE — Progress Notes (Signed)
Pt. Did not get treated today d/t his blood test results from today. Will reschedule in one week. - per [name].

## 2018-04-07 NOTE — Patient Instructions (Signed)
Implanted Port Insertion, Care After  This sheet gives you information about how to care for yourself after your procedure. Your health care provider may also give you more specific instructions. If you have problems or questions, contact your health care provider.  What can I expect after the procedure?  After the procedure, it is common to have:  · Discomfort at the port insertion site.  · Bruising on the skin over the port. This should improve over 3-4 days.  Follow these instructions at home:  Port care  · After your port is placed, you will get a manufacturer's information card. The card has information about your port. Keep this card with you at all times.  · Take care of the port as told by your health care provider. Ask your health care provider if you or a family member can get training for taking care of the port at home. A home health care nurse may also take care of the port.  · Make sure to remember what type of port you have.  Incision care         · Follow instructions from your health care provider about how to take care of your port insertion site. Make sure you:  ? Wash your hands with soap and water before and after you change your bandage (dressing). If soap and water are not available, use hand sanitizer.  ? Change your dressing as told by your health care provider.  ? Leave stitches (sutures), skin glue, or adhesive strips in place. These skin closures may need to stay in place for 2 weeks or longer. If adhesive strip edges start to loosen and curl up, you may trim the loose edges. Do not remove adhesive strips completely unless your health care provider tells you to do that.  · Check your port insertion site every day for signs of infection. Check for:  ? Redness, swelling, or pain.  ? Fluid or blood.  ? Warmth.  ? Pus or a bad smell.  Activity  · Return to your normal activities as told by your health care provider. Ask your health care provider what activities are safe for you.  · Do not  lift anything that is heavier than 10 lb (4.5 kg), or the limit that you are told, until your health care provider says that it is safe.  General instructions  · Take over-the-counter and prescription medicines only as told by your health care provider.  · Do not take baths, swim, or use a hot tub until your health care provider approves. Ask your health care provider if you may take showers. You may only be allowed to take sponge baths.  · Do not drive for 24 hours if you were given a sedative during your procedure.  · Wear a medical alert bracelet in case of an emergency. This will tell any health care providers that you have a port.  · Keep all follow-up visits as told by your health care provider. This is important.  Contact a health care provider if:  · You cannot flush your port with saline as directed, or you cannot draw blood from the port.  · You have a fever or chills.  · You have redness, swelling, or pain around your port insertion site.  · You have fluid or blood coming from your port insertion site.  · Your port insertion site feels warm to the touch.  · You have pus or a bad smell coming from the port   insertion site.  Get help right away if:  · You have chest pain or shortness of breath.  · You have bleeding from your port that you cannot control.  Summary  · Take care of the port as told by your health care provider. Keep the manufacturer's information card with you at all times.  · Change your dressing as told by your health care provider.  · Contact a health care provider if you have a fever or chills or if you have redness, swelling, or pain around your port insertion site.  · Keep all follow-up visits as told by your health care provider.  This information is not intended to replace advice given to you by your health care provider. Make sure you discuss any questions you have with your health care provider.  Document Released: 12/23/2012 Document Revised: 09/30/2017 Document Reviewed:  09/30/2017  [employer] Interactive Patient Education © 2019 [employer]

## 2018-04-07 NOTE — Telephone Encounter (Signed)
Richardson Landry from lab informed me of a critical [place]. MD notified.

## 2018-04-09 ENCOUNTER — Inpatient Hospital Stay: Payer: 59

## 2018-04-14 ENCOUNTER — Other Ambulatory Visit: Payer: 59

## 2018-04-14 ENCOUNTER — Ambulatory Visit: Payer: 59

## 2018-04-14 ENCOUNTER — Ambulatory Visit: Payer: 59 | Admitting: Hematology & Oncology

## 2018-04-15 ENCOUNTER — Inpatient Hospital Stay: Payer: 59

## 2018-04-15 ENCOUNTER — Other Ambulatory Visit: Payer: Self-pay

## 2018-04-15 VITALS — BP 128/85 | HR 70 | Temp 98.2°F | Resp 18 | Ht 69.0 in | Wt 185.8 lb

## 2018-04-15 DIAGNOSIS — C160 Malignant neoplasm of cardia: Secondary | ICD-10-CM

## 2018-04-15 DIAGNOSIS — C799 Secondary malignant neoplasm of unspecified site: Principal | ICD-10-CM

## 2018-04-15 DIAGNOSIS — Z95828 Presence of other vascular implants and grafts: Secondary | ICD-10-CM

## 2018-04-15 LAB — CMP (CANCER CENTER ONLY)
ALT: 36 U/L (ref 0–44)
AST: 34 U/L (ref 15–41)
Albumin: 4 g/dL (ref 3.5–5.0)
Alkaline Phosphatase: 92 U/L (ref 38–126)
Anion gap: 8 (ref 5–15)
BUN: 11 mg/dL (ref 6–20)
CO2: 27 mmol/L (ref 22–32)
Calcium: 9.3 mg/dL (ref 8.9–10.3)
Chloride: 103 mmol/L (ref 98–111)
Creatinine: 0.87 mg/dL (ref 0.61–1.24)
GFR, Est AFR Am: >60 mL/min (ref >60)
GFR, Estimated: >60 mL/min (ref >60)
Glucose, Bld: 146 mg/dL — ABNORMAL HIGH (ref 70–99)
Potassium: 3.8 mmol/L (ref 3.5–5.1)
Sodium: 138 mmol/L (ref 135–145)
Total Bilirubin: 0.4 mg/dL (ref 0.3–1.2)
Total Protein: 6.4 g/dL — ABNORMAL LOW (ref 6.5–8.1)

## 2018-04-15 LAB — CBC WITH DIFFERENTIAL (CANCER CENTER ONLY)
Abs Immature Granulocytes: 0.06 10*3/uL (ref 0.00–0.07)
Basophils Absolute: 0 10*3/uL (ref 0.0–0.1)
Basophils Relative: 1 %
Eosinophils Absolute: 0 10*3/uL (ref 0.0–0.5)
Eosinophils Relative: 1 %
HCT: 44.5 % (ref 39.0–52.0)
Hemoglobin: 14.6 g/dL (ref 13.0–17.0)
Immature Granulocytes: 2 %
Lymphocytes Relative: 34 %
Lymphs Abs: 1.4 10*3/uL (ref 0.7–4.0)
MCH: 28.1 pg (ref 26.0–34.0)
MCHC: 32.8 g/dL (ref 30.0–36.0)
MCV: 85.7 fL (ref 80.0–100.0)
Monocytes Absolute: 0.5 10*3/uL (ref 0.1–1.0)
Monocytes Relative: 11 %
NEUTROS ABS: 2 10*3/uL (ref 1.7–7.7)
Neutrophils Relative %: 51 %
Platelet Count: 129 10*3/uL — ABNORMAL LOW (ref 150–400)
RBC: 5.19 MIL/uL (ref 4.22–5.81)
RDW: 16.9 % — ABNORMAL HIGH (ref 11.5–15.5)
WBC Count: 4 10*3/uL (ref 4.0–10.5)
nRBC: 0 % (ref 0.0–0.2)

## 2018-04-15 MED ORDER — ACETAMINOPHEN 325 MG PO TABS
650.0000 mg | ORAL_TABLET | Freq: Once | ORAL | Status: AC
  Administered 2018-04-15: 650 mg via ORAL

## 2018-04-15 MED ORDER — DEXAMETHASONE 4 MG PO TABS: 8.0000 mg | ORAL_TABLET | Freq: Every day | ORAL | 4 refills | Status: DC

## 2018-04-15 MED ORDER — OXALIPLATIN CHEMO INJECTION 100 MG/20ML
65.0000 mg/m2 | Freq: Once | INTRAVENOUS | Status: AC
  Administered 2018-04-15: 135 mg via INTRAVENOUS
  Filled 2018-04-15: qty 10

## 2018-04-15 MED ORDER — DIPHENHYDRAMINE HCL 25 MG PO CAPS
50.0000 mg | ORAL_CAPSULE | Freq: Once | ORAL | Status: AC
  Administered 2018-04-15: 50 mg via ORAL

## 2018-04-15 MED ORDER — SODIUM CHLORIDE 0.9 % IV SOLN
1800.0000 mg/m2 | INTRAVENOUS | Status: DC
  Administered 2018-04-15: 3750 mg via INTRAVENOUS
  Filled 2018-04-15: qty 75

## 2018-04-15 MED ORDER — DEXTROSE 5 % IV SOLN
Freq: Once | INTRAVENOUS | Status: AC
  Administered 2018-04-15: 13:00:00 via INTRAVENOUS
  Filled 2018-04-15: qty 250

## 2018-04-15 MED ORDER — ACETAMINOPHEN 325 MG PO TABS
ORAL_TABLET | ORAL | Status: AC
  Filled 2018-04-15: qty 1

## 2018-04-15 MED ORDER — SODIUM CHLORIDE 0.9% FLUSH
10.0000 mL | Freq: Once | INTRAVENOUS | Status: AC
  Administered 2018-04-15: 10 mL via INTRAVENOUS
  Filled 2018-04-15: qty 10

## 2018-04-15 MED ORDER — PALONOSETRON HCL INJECTION 0.25 MG/5ML
0.2500 mg | Freq: Once | INTRAVENOUS | Status: AC
  Administered 2018-04-15: 0.25 mg via INTRAVENOUS

## 2018-04-15 MED ORDER — DEXAMETHASONE SODIUM PHOSPHATE 10 MG/ML IJ SOLN
INTRAMUSCULAR | Status: AC
  Filled 2018-04-15: qty 1

## 2018-04-15 MED ORDER — DEXAMETHASONE SODIUM PHOSPHATE 10 MG/ML IJ SOLN
10.0000 mg | Freq: Once | INTRAMUSCULAR | Status: AC
  Administered 2018-04-15: 10 mg via INTRAVENOUS

## 2018-04-15 MED ORDER — PALONOSETRON HCL INJECTION 0.25 MG/5ML
INTRAVENOUS | Status: AC
  Filled 2018-04-15: qty 5

## 2018-04-15 MED ORDER — FLUOROURACIL CHEMO INJECTION 2.5 GM/50ML
300.0000 mg/m2 | Freq: Once | INTRAVENOUS | Status: AC
  Administered 2018-04-15: 600 mg via INTRAVENOUS
  Filled 2018-04-15: qty 12

## 2018-04-15 MED ORDER — SODIUM CHLORIDE 0.9 % IV SOLN
Freq: Once | INTRAVENOUS | Status: AC
  Administered 2018-04-15: 11:00:00 via INTRAVENOUS
  Filled 2018-04-15: qty 250

## 2018-04-15 MED ORDER — DIPHENHYDRAMINE HCL 25 MG PO CAPS
ORAL_CAPSULE | ORAL | Status: AC
  Filled 2018-04-15: qty 2

## 2018-04-15 MED ORDER — LEUCOVORIN CALCIUM INJECTION 350 MG
300.0000 mg/m2 | Freq: Once | INTRAVENOUS | Status: AC
  Administered 2018-04-15: 624 mg via INTRAVENOUS
  Filled 2018-04-15: qty 31.2

## 2018-04-15 MED ORDER — TRASTUZUMAB CHEMO 150 MG IV SOLR
4.0000 mg/kg | Freq: Once | INTRAVENOUS | Status: AC
  Administered 2018-04-15: 357 mg via INTRAVENOUS
  Filled 2018-04-15: qty 17

## 2018-04-15 MED FILL — DEXAMETHASONE 4 MG TABLET: 4 | 15 days supply | Qty: 30 | Fill #0

## 2018-04-15 NOTE — Patient Instructions (Signed)

## 2018-04-15 NOTE — Patient Instructions (Signed)
Fluorouracil, 5-FU injection What is this medicine? FLUOROURACIL, 5-FU (flure oh YOOR a sil) is a chemotherapy drug. It slows the growth of cancer cells. This medicine is used to treat many types of cancer like breast cancer, colon or rectal cancer, pancreatic cancer, and stomach cancer. This medicine may be used for other purposes; ask your health care provider or pharmacist if you have questions. COMMON BRAND NAME(S): Adrucil What should I tell my health care provider before I take this medicine? They need to know if you have any of these conditions: -blood disorders -dihydropyrimidine dehydrogenase (DPD) deficiency -infection (especially a virus infection such as chickenpox, cold sores, or herpes) -kidney disease -liver disease -malnourished, poor nutrition -recent or ongoing radiation therapy -an unusual or allergic reaction to fluorouracil, other chemotherapy, other medicines, foods, dyes, or preservatives -pregnant or trying to get pregnant -breast-feeding How should I use this medicine? This drug is given as an infusion or injection into a vein. It is administered in a hospital or clinic by a specially trained health care professional. Talk to your pediatrician regarding the use of this medicine in children. Special care may be needed. Overdosage: If you think you have taken too much of this medicine contact a poison control center or emergency room at once. NOTE: This medicine is only for you. Do not share this medicine with others. What if I miss a dose? It is important not to miss your dose. Call your doctor or health care professional if you are unable to keep an appointment. What may interact with this medicine? -allopurinol -cimetidine -dapsone -digoxin -hydroxyurea -leucovorin -levamisole -medicines for seizures like ethotoin, fosphenytoin, phenytoin -medicines to increase blood counts like filgrastim, pegfilgrastim, sargramostim -medicines that treat or prevent blood  clots like warfarin, enoxaparin, and dalteparin -methotrexate -metronidazole -pyrimethamine -some other chemotherapy drugs like busulfan, cisplatin, estramustine, vinblastine -trimethoprim -trimetrexate -vaccines Talk to your doctor or health care professional before taking any of these medicines: -acetaminophen -aspirin -ibuprofen -ketoprofen -naproxen This list may not describe all possible interactions. Give your health care provider a list of all the medicines, herbs, non-prescription drugs, or dietary supplements you use. Also tell them if you smoke, drink alcohol, or use illegal drugs. Some items may interact with your medicine. What should I watch for while using this medicine? Visit your doctor for checks on your progress. This drug may make you feel generally unwell. This is not uncommon, as chemotherapy can affect healthy cells as well as cancer cells. Report any side effects. Continue your course of treatment even though you feel ill unless your doctor tells you to stop. In some cases, you may be given additional medicines to help with side effects. Follow all directions for their use. Call your doctor or health care professional for advice if you get a fever, chills or sore throat, or other symptoms of a cold or flu. Do not treat yourself. This drug decreases your body's ability to fight infections. Try to avoid being around people who are sick. This medicine may increase your risk to bruise or bleed. Call your doctor or health care professional if you notice any unusual bleeding. Be careful brushing and flossing your teeth or using a toothpick because you may get an infection or bleed more easily. If you have any dental work done, tell your dentist you are receiving this medicine. Avoid taking products that contain aspirin, acetaminophen, ibuprofen, naproxen, or ketoprofen unless instructed by your doctor. These medicines may hide a fever. Do not become pregnant while taking this  medicine. Women should inform their doctor if they wish to become pregnant or think they might be pregnant. There is a potential for serious side effects to an unborn child. Talk to your health care professional or pharmacist for more information. Do not breast-feed an infant while taking this medicine. Men should inform their doctor if they wish to father a child. This medicine may lower sperm counts. Do not treat diarrhea with over the counter products. Contact your doctor if you have diarrhea that lasts more than 2 days or if it is severe and watery. This medicine can make you more sensitive to the sun. Keep out of the sun. If you cannot avoid being in the sun, wear protective clothing and use sunscreen. Do not use sun lamps or tanning beds/booths. What side effects may I notice from receiving this medicine? Side effects that you should report to your doctor or health care professional as soon as possible: -allergic reactions like skin rash, itching or hives, swelling of the face, lips, or tongue -low blood counts - this medicine may decrease the number of white blood cells, red blood cells and platelets. You may be at increased risk for infections and bleeding. -signs of infection - fever or chills, cough, sore throat, pain or difficulty passing urine -signs of decreased platelets or bleeding - bruising, pinpoint red spots on the skin, black, tarry stools, blood in the urine -signs of decreased red blood cells - unusually weak or tired, fainting spells, lightheadedness -breathing problems -changes in vision -chest pain -mouth sores -nausea and vomiting -pain, swelling, redness at site where injected -pain, tingling, numbness in the hands or feet -redness, swelling, or sores on hands or feet -stomach pain -unusual bleeding Side effects that usually do not require medical attention (report to your doctor or health care professional if they continue or are bothersome): -changes in finger or  toe nails -diarrhea -dry or itchy skin -hair loss -headache -loss of appetite -sensitivity of eyes to the light -stomach upset -unusually teary eyes This list may not describe all possible side effects. Call your doctor for medical advice about side effects. You may report side effects to FDA at 1-800-FDA-1088. Where should I keep my medicine? This drug is given in a hospital or clinic and will not be stored at home. NOTE: This sheet is a summary. It may not cover all possible information. If you have questions about this medicine, talk to your doctor, pharmacist, or health care provider.  2019 [employer] Standard (2007-07-08 13:53:16) Leucovorin injection What is this medicine? LEUCOVORIN (loo koe VOR in) is used to prevent or treat the harmful effects of some medicines. This medicine is used to treat anemia caused by a low amount of folic acid in the body. It is also used with 5-fluorouracil (5-FU) to treat colon cancer. This medicine may be used for other purposes; ask your health care provider or pharmacist if you have questions. What should I tell my health care provider before I take this medicine? They need to know if you have any of these conditions: -anemia from low levels of vitamin B-12 in the blood -an unusual or allergic reaction to leucovorin, folic acid, other medicines, foods, dyes, or preservatives -pregnant or trying to get pregnant -breast-feeding How should I use this medicine? This medicine is for injection into a muscle or into a vein. It is given by a health care professional in a hospital or clinic setting. Talk to your pediatrician regarding the use of this medicine in   children. Special care may be needed. Overdosage: If you think you have taken too much of this medicine contact a poison control center or emergency room at once. NOTE: This medicine is only for you. Do not share this medicine with others. What if I miss a dose? This does not apply. What may  interact with this medicine? -capecitabine -fluorouracil -phenobarbital -phenytoin -primidone -trimethoprim-sulfamethoxazole This list may not describe all possible interactions. Give your health care provider a list of all the medicines, herbs, non-prescription drugs, or dietary supplements you use. Also tell them if you smoke, drink alcohol, or use illegal drugs. Some items may interact with your medicine. What should I watch for while using this medicine? Your condition will be monitored carefully while you are receiving this medicine. This medicine may increase the side effects of 5-fluorouracil, 5-FU. Tell your doctor or health care professional if you have diarrhea or mouth sores that do not get better or that get worse. What side effects may I notice from receiving this medicine? Side effects that you should report to your doctor or health care professional as soon as possible: -allergic reactions like skin rash, itching or hives, swelling of the face, lips, or tongue -breathing problems -fever, infection -mouth sores -unusual bleeding or bruising -unusually weak or tired Side effects that usually do not require medical attention (report to your doctor or health care professional if they continue or are bothersome): -constipation or diarrhea -loss of appetite -nausea, vomiting This list may not describe all possible side effects. Call your doctor for medical advice about side effects. You may report side effects to FDA at 1-800-FDA-1088. Where should I keep my medicine? This drug is given in a hospital or clinic and will not be stored at home. NOTE: This sheet is a summary. It may not cover all possible information. If you have questions about this medicine, talk to your doctor, pharmacist, or health care provider.  2019 [employer] Standard (2007-09-08 16:50:29) Oxaliplatin Injection What is this medicine? OXALIPLATIN (ox AL i PLA tin) is a chemotherapy drug. It targets fast  dividing cells, like cancer cells, and causes these cells to die. This medicine is used to treat cancers of the colon and rectum, and many other cancers. This medicine may be used for other purposes; ask your health care provider or pharmacist if you have questions. COMMON BRAND NAME(S): Eloxatin What should I tell my health care provider before I take this medicine? They need to know if you have any of these conditions: -kidney disease -an unusual or allergic reaction to oxaliplatin, other chemotherapy, other medicines, foods, dyes, or preservatives -pregnant or trying to get pregnant -breast-feeding How should I use this medicine? This drug is given as an infusion into a vein. It is administered in a hospital or clinic by a specially trained health care professional. Talk to your pediatrician regarding the use of this medicine in children. Special care may be needed. Overdosage: If you think you have taken too much of this medicine contact a poison control center or emergency room at once. NOTE: This medicine is only for you. Do not share this medicine with others. What if I miss a dose? It is important not to miss a dose. Call your doctor or health care professional if you are unable to keep an appointment. What may interact with this medicine? -medicines to increase blood counts like filgrastim, pegfilgrastim, sargramostim -probenecid -some antibiotics like amikacin, gentamicin, neomycin, polymyxin B, streptomycin, tobramycin -zalcitabine Talk to your doctor or health  care professional before taking any of these medicines: -acetaminophen -aspirin -ibuprofen -ketoprofen -naproxen This list may not describe all possible interactions. Give your health care provider a list of all the medicines, herbs, non-prescription drugs, or dietary supplements you use. Also tell them if you smoke, drink alcohol, or use illegal drugs. Some items may interact with your medicine. What should I watch for  while using this medicine? Your condition will be monitored carefully while you are receiving this medicine. You will need important blood work done while you are taking this medicine. This medicine can make you more sensitive to cold. Do not drink cold drinks or use ice. Cover exposed skin before coming in contact with cold temperatures or cold objects. When out in cold weather wear warm clothing and cover your mouth and nose to warm the air that goes into your lungs. Tell your doctor if you get sensitive to the cold. This drug may make you feel generally unwell. This is not uncommon, as chemotherapy can affect healthy cells as well as cancer cells. Report any side effects. Continue your course of treatment even though you feel ill unless your doctor tells you to stop. In some cases, you may be given additional medicines to help with side effects. Follow all directions for their use. Call your doctor or health care professional for advice if you get a fever, chills or sore throat, or other symptoms of a cold or flu. Do not treat yourself. This drug decreases your body's ability to fight infections. Try to avoid being around people who are sick. This medicine may increase your risk to bruise or bleed. Call your doctor or health care professional if you notice any unusual bleeding. Be careful brushing and flossing your teeth or using a toothpick because you may get an infection or bleed more easily. If you have any dental work done, tell your dentist you are receiving this medicine. Avoid taking products that contain aspirin, acetaminophen, ibuprofen, naproxen, or ketoprofen unless instructed by your doctor. These medicines may hide a fever. Do not become pregnant while taking this medicine. Women should inform their doctor if they wish to become pregnant or think they might be pregnant. There is a potential for serious side effects to an unborn child. Talk to your health care professional or pharmacist for  more information. Do not breast-feed an infant while taking this medicine. Call your doctor or health care professional if you get diarrhea. Do not treat yourself. What side effects may I notice from receiving this medicine? Side effects that you should report to your doctor or health care professional as soon as possible: -allergic reactions like skin rash, itching or hives, swelling of the face, lips, or tongue -low blood counts - This drug may decrease the number of white blood cells, red blood cells and platelets. You may be at increased risk for infections and bleeding. -signs of infection - fever or chills, cough, sore throat, pain or difficulty passing urine -signs of decreased platelets or bleeding - bruising, pinpoint red spots on the skin, black, tarry stools, nosebleeds -signs of decreased red blood cells - unusually weak or tired, fainting spells, lightheadedness -breathing problems -chest pain, pressure -cough -diarrhea -jaw tightness -mouth sores -nausea and vomiting -pain, swelling, redness or irritation at the injection site -pain, tingling, numbness in the hands or feet -problems with balance, talking, walking -redness, blistering, peeling or loosening of the skin, including inside the mouth -trouble passing urine or change in the amount of urine Side   effects that usually do not require medical attention (report to your doctor or health care professional if they continue or are bothersome): -changes in vision -constipation -hair loss -loss of appetite -metallic taste in the mouth or changes in taste -stomach pain This list may not describe all possible side effects. Call your doctor for medical advice about side effects. You may report side effects to FDA at 1-800-FDA-1088. Where should I keep my medicine? This drug is given in a hospital or clinic and will not be stored at home. NOTE: This sheet is a summary. It may not cover all possible information. If you have  questions about this medicine, talk to your doctor, pharmacist, or health care provider.  2019 [employer] Standard (2007-09-29 17:22:47) Trastuzumab injection for infusion What is this medicine? TRASTUZUMAB (tras TOO zoo mab) is a monoclonal antibody. It is used to treat breast cancer and stomach cancer. This medicine may be used for other purposes; ask your health care provider or pharmacist if you have questions. COMMON BRAND NAME(S): Herceptin, Calla Kicks, OGIVRI What should I tell my health care provider before I take this medicine? They need to know if you have any of these conditions: -heart disease -heart failure -lung or breathing disease, like asthma -an unusual or allergic reaction to trastuzumab, benzyl alcohol, or other medications, foods, dyes, or preservatives -pregnant or trying to get pregnant -breast-feeding How should I use this medicine? This drug is given as an infusion into a vein. It is administered in a hospital or clinic by a specially trained health care professional. Talk to your pediatrician regarding the use of this medicine in children. This medicine is not approved for use in children. Overdosage: If you think you have taken too much of this medicine contact a poison control center or emergency room at once. NOTE: This medicine is only for you. Do not share this medicine with others. What if I miss a dose? It is important not to miss a dose. Call your doctor or health care professional if you are unable to keep an appointment. What may interact with this medicine? This medicine may interact with the following medications: -certain types of chemotherapy, such as daunorubicin, doxorubicin, epirubicin, and idarubicin This list may not describe all possible interactions. Give your health care provider a list of all the medicines, herbs, non-prescription drugs, or dietary supplements you use. Also tell them if you smoke, drink alcohol, or use illegal drugs. Some  items may interact with your medicine. What should I watch for while using this medicine? Visit your doctor for checks on your progress. Report any side effects. Continue your course of treatment even though you feel ill unless your doctor tells you to stop. Call your doctor or health care professional for advice if you get a fever, chills or sore throat, or other symptoms of a cold or flu. Do not treat yourself. Try to avoid being around people who are sick. You may experience fever, chills and shaking during your first infusion. These effects are usually mild and can be treated with other medicines. Report any side effects during the infusion to your health care professional. Fever and chills usually do not happen with later infusions. Do not become pregnant while taking this medicine or for 7 months after stopping it. Women should inform their doctor if they wish to become pregnant or think they might be pregnant. Women of child-bearing potential will need to have a negative pregnancy test before starting this medicine. There is a potential for  serious side effects to an unborn child. Talk to your health care professional or pharmacist for more information. Do not breast-feed an infant while taking this medicine or for 7 months after stopping it. Women must use effective birth control with this medicine. What side effects may I notice from receiving this medicine? Side effects that you should report to your doctor or health care professional as soon as possible: -allergic reactions like skin rash, itching or hives, swelling of the face, lips, or tongue -chest pain or palpitations -cough -dizziness -feeling faint or lightheaded, falls -fever -general ill feeling or flu-like symptoms -signs of worsening heart failure like breathing problems; swelling in your legs and feet -unusually weak or tired Side effects that usually do not require medical attention (report to your doctor or health care  professional if they continue or are bothersome): -bone pain -changes in taste -diarrhea -joint pain -nausea/vomiting -weight loss This list may not describe all possible side effects. Call your doctor for medical advice about side effects. You may report side effects to FDA at 1-800-FDA-1088. Where should I keep my medicine? This drug is given in a hospital or clinic and will not be stored at home. NOTE: This sheet is a summary. It may not cover all possible information. If you have questions about this medicine, talk to your doctor, pharmacist, or health care provider.  2019 [employer] Standard (2016-02-27 14:37:52)

## 2018-04-17 ENCOUNTER — Inpatient Hospital Stay: Payer: 59

## 2018-04-17 DIAGNOSIS — C160 Malignant neoplasm of cardia: Secondary | ICD-10-CM | POA: Diagnosis not present

## 2018-04-17 DIAGNOSIS — C799 Secondary malignant neoplasm of unspecified site: Principal | ICD-10-CM

## 2018-04-17 MED ORDER — SODIUM CHLORIDE 0.9% FLUSH
10.0000 mL | INTRAVENOUS | Status: DC | PRN
  Administered 2018-04-17: 10 mL
  Filled 2018-04-17: qty 10

## 2018-04-17 MED ORDER — HEPARIN SOD (PORK) LOCK FLUSH 100 UNIT/ML IV SOLN
500.0000 [IU] | Freq: Once | INTRAVENOUS | Status: AC | PRN
  Administered 2018-04-17: 500 [IU]
  Filled 2018-04-17: qty 5

## 2018-04-20 ENCOUNTER — Inpatient Hospital Stay: Payer: 59

## 2018-04-20 ENCOUNTER — Inpatient Hospital Stay: Payer: 59 | Admitting: Hematology & Oncology

## 2018-04-22 ENCOUNTER — Inpatient Hospital Stay: Payer: 59

## 2018-04-29 ENCOUNTER — Inpatient Hospital Stay: Payer: 59 | Attending: Hematology & Oncology

## 2018-04-29 ENCOUNTER — Inpatient Hospital Stay (HOSPITAL_BASED_OUTPATIENT_CLINIC_OR_DEPARTMENT_OTHER): Payer: 59 | Admitting: Hematology & Oncology

## 2018-04-29 ENCOUNTER — Inpatient Hospital Stay: Payer: 59

## 2018-04-29 VITALS — BP 137/93 | HR 76 | Temp 98.1°F | Resp 18 | Wt 186.2 lb

## 2018-04-29 DIAGNOSIS — C160 Malignant neoplasm of cardia: Secondary | ICD-10-CM | POA: Diagnosis not present

## 2018-04-29 DIAGNOSIS — C799 Secondary malignant neoplasm of unspecified site: Secondary | ICD-10-CM | POA: Insufficient documentation

## 2018-04-29 DIAGNOSIS — Z452 Encounter for adjustment and management of vascular access device: Secondary | ICD-10-CM | POA: Insufficient documentation

## 2018-04-29 DIAGNOSIS — Z79899 Other long term (current) drug therapy: Secondary | ICD-10-CM | POA: Diagnosis not present

## 2018-04-29 DIAGNOSIS — D500 Iron deficiency anemia secondary to blood loss (chronic): Secondary | ICD-10-CM

## 2018-04-29 DIAGNOSIS — Z1211 Encounter for screening for malignant neoplasm of colon: Secondary | ICD-10-CM

## 2018-04-29 DIAGNOSIS — R202 Paresthesia of skin: Secondary | ICD-10-CM | POA: Insufficient documentation

## 2018-04-29 LAB — CMP (CANCER CENTER ONLY)
ALBUMIN: 4.1 g/dL (ref 3.5–5.0)
ALT: 35 U/L (ref 0–44)
AST: 34 U/L (ref 15–41)
Alkaline Phosphatase: 91 U/L (ref 38–126)
Anion gap: 8 (ref 5–15)
BUN: 8 mg/dL (ref 6–20)
CO2: 27 mmol/L (ref 22–32)
Calcium: 9.5 mg/dL (ref 8.9–10.3)
Chloride: 105 mmol/L (ref 98–111)
Creatinine: 0.99 mg/dL (ref 0.61–1.24)
GFR, Est AFR Am: >60 mL/min (ref >60)
GFR, Estimated: >60 mL/min (ref >60)
GLUCOSE: 94 mg/dL (ref 70–99)
Potassium: 3.8 mmol/L (ref 3.5–5.1)
Sodium: 140 mmol/L (ref 135–145)
Total Bilirubin: 0.5 mg/dL (ref 0.3–1.2)
Total Protein: 6.4 g/dL — ABNORMAL LOW (ref 6.5–8.1)

## 2018-04-29 LAB — CBC WITH DIFFERENTIAL (CANCER CENTER ONLY)
Abs Immature Granulocytes: 0.01 10*3/uL (ref 0.00–0.07)
BASOS ABS: 0 10*3/uL (ref 0.0–0.1)
Basophils Relative: 1 %
Eosinophils Absolute: 0.1 10*3/uL (ref 0.0–0.5)
Eosinophils Relative: 2 %
HEMATOCRIT: 46.2 % (ref 39.0–52.0)
Hemoglobin: 15.1 g/dL (ref 13.0–17.0)
Immature Granulocytes: 0 %
LYMPHS ABS: 1.3 10*3/uL (ref 0.7–4.0)
Lymphocytes Relative: 34 %
MCH: 28.1 pg (ref 26.0–34.0)
MCHC: 32.7 g/dL (ref 30.0–36.0)
MCV: 85.9 fL (ref 80.0–100.0)
Monocytes Absolute: 0.4 10*3/uL (ref 0.1–1.0)
Monocytes Relative: 12 %
Neutro Abs: 1.9 10*3/uL (ref 1.7–7.7)
Neutrophils Relative %: 51 %
Platelet Count: 63 10*3/uL — ABNORMAL LOW (ref 150–400)
RBC: 5.38 MIL/uL (ref 4.22–5.81)
RDW: 16.7 % — ABNORMAL HIGH (ref 11.5–15.5)
WBC Count: 3.7 10*3/uL — ABNORMAL LOW (ref 4.0–10.5)
nRBC: 0 % (ref 0.0–0.2)

## 2018-04-29 MED ORDER — SODIUM CHLORIDE 0.9% FLUSH
10.0000 mL | Freq: Once | INTRAVENOUS | Status: AC | PRN
  Administered 2018-04-29: 10 mL
  Filled 2018-04-29: qty 10

## 2018-04-29 MED ORDER — HEPARIN SOD (PORK) LOCK FLUSH 100 UNIT/ML IV SOLN
500.0000 [IU] | Freq: Once | INTRAVENOUS | Status: AC | PRN
  Administered 2018-04-29: 500 [IU]
  Filled 2018-04-29: qty 5

## 2018-04-29 NOTE — Progress Notes (Signed)
Hematology and Oncology Follow Up Visit  SACRAMENTO MONDS [id number] Jan 22, 1966 53 y.o. 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  Unfortunately, we are having more problems with his blood counts.  His platelet count is low.  He seems to be dropping his blood counts limit too much.  We are going to have to make an adjustment with his protocol.  I think we will get him set up with a PET scan next week.  If the PET scan shows that he is in a continued response, then we will drop the oxaliplatin and just use the 5-FU with Herceptin.  He is eating well.  He is having no problems with dysphasia.  He has had no bleeding.  He has had no nausea or vomiting.  He has had no rashes.  He has had a little bit of tingling in his fingers and toes.  Overall, his performance status is ECOG 1.   Medications:  Allergies as of 04/29/2018   No Known Allergies     Medication List       Accurate as of April 29, 2018 12:22 PM. Always use your most recent med list.        dexamethasone 4 MG tablet Commonly known as:  DECADRON Take 2 tablets (8 mg total) by mouth daily. Start the day after chemotherapy for 2 days. Take with food.   DEXILANT 60 MG capsule Generic drug:  dexlansoprazole Take 60 mg by mouth daily.   lidocaine-prilocaine cream Commonly known as:  EMLA Apply to affected area once   ondansetron 8 MG tablet Commonly known as:  ZOFRAN Take 1 tablet (8 mg total) by mouth 2 (two) times daily as needed for refractory nausea / vomiting. Start on day 3 after chemotherapy.   polyethylene glycol packet Commonly known as:  MIRALAX / GLYCOLAX Take 17 g by mouth 2 (two) times daily.   prochlorperazine 10 MG tablet Commonly known as:  COMPAZINE Take 1 tablet (10 mg total) by mouth every 6 (six) hours as needed  (Nausea or vomiting).       Allergies: No Known Allergies  Past Medical History, Surgical history, Social history, and Family History were reviewed and updated.  Review of Systems: Review of Systems  Constitutional: Negative.   HENT: Negative.   Eyes: Negative.   Respiratory: Negative.   Cardiovascular: Negative.   Gastrointestinal: Negative.   Genitourinary: Negative.   Musculoskeletal: Negative.   Skin: Negative.   Neurological: Negative.   Endo/Heme/Allergies: Negative.   Psychiatric/Behavioral: Negative.      Physical Exam:  weight is 186 lb 4 oz (84.5 kg). His oral temperature is 98.1 F (36.7 C). His blood pressure is 137/93 (abnormal) and his pulse is 76. His respiration is 18 and oxygen saturation is 98%.   Wt Readings from Last 3 Encounters:  04/29/18 186 lb 4 oz (84.5 kg)  04/15/18 185 lb 12.8 oz (84.3 kg)  03/17/18 181 lb (82.1 kg)    Physical Exam Vitals signs reviewed.  HENT:     Head: Normocephalic and atraumatic.  Eyes:     Pupils: Pupils are equal, round, and reactive to light.  Neck:     Musculoskeletal: Normal range of motion.  Cardiovascular:     Rate and Rhythm: Normal rate and regular rhythm.     Heart sounds: Normal heart sounds.  Pulmonary:     Effort: Pulmonary effort is normal.     Breath sounds: Normal breath sounds.  Abdominal:     General: Bowel sounds are normal.     Palpations: Abdomen is soft.  Musculoskeletal: Normal range of motion.        General: No tenderness or deformity.  Lymphadenopathy:     Cervical: No cervical adenopathy.  Skin:    General: Skin is warm and dry.     Findings: No erythema or rash.  Neurological:     Mental Status: He is alert and oriented to person, place, and time.  Psychiatric:        Behavior: Behavior normal.        Thought Content: Thought content normal.        Judgment: Judgment normal.      Lab Results  Component Value Date   WBC 3.7 (L) 04/29/2018   HGB 15.1 04/29/2018   HCT  46.2 04/29/2018   MCV 85.9 04/29/2018   PLT 63 (L) 04/29/2018   Lab Results  Component Value Date   FERRITIN 21 (L) 03/17/2018   IRON 52 03/17/2018   TIBC 344 03/17/2018   UIBC 291 03/17/2018   IRONPCTSAT 15 (L) 03/17/2018   Lab Results  Component Value Date   RBC 5.38 04/29/2018   No results found for: KPAFRELGTCHN, LAMBDASER, KAPLAMBRATIO No results found for: IGGSERUM, IGA, IGMSERUM No results found for: Ronnald Ramp, A1GS, A2GS, Tillman Sers, SPEI   Chemistry      Component Value Date/Time   NA 140 04/29/2018 1040   K 3.8 04/29/2018 1040   CL 105 04/29/2018 1040   CO2 27 04/29/2018 1040   BUN 8 04/29/2018 1040   CREATININE 0.99 04/29/2018 1040      Component Value Date/Time   CALCIUM 9.5 04/29/2018 1040   ALKPHOS 91 04/29/2018 1040   AST 34 04/29/2018 1040   ALT 35 04/29/2018 1040   BILITOT 0.5 04/29/2018 1040      [name] is a very pleasant 53 yo caucasian gentleman with metastatic adenocarcinoma of the GE junction, HER-2 positive.   He is doing incredibly well.  Again, everything is being driven by the fact that his cancer is HER-2 positive.  Again, we will see about the PET scan.  We will get this next week.  I will see him back in 2 weeks.  At that time, we will then make an adjustment with his protocol.     [name], MD 2/12/202012:22 PM

## 2018-04-29 NOTE — Patient Instructions (Signed)

## 2018-04-29 NOTE — Addendum Note (Signed)
Addended by: Perlie Gold on: 04/29/2018 12:42 PM   Modules accepted: Orders

## 2018-04-30 LAB — IRON AND TIBC
Iron: 89 ug/dL (ref 42–163)
Saturation Ratios: 28 % (ref 20–55)
TIBC: 313 ug/dL (ref 202–409)
UIBC: 224 ug/dL (ref 117–376)

## 2018-04-30 LAB — FERRITIN: Ferritin: 163 ng/mL (ref 24–336)

## 2018-05-01 ENCOUNTER — Inpatient Hospital Stay: Payer: 59

## 2018-05-05 ENCOUNTER — Encounter: Payer: Self-pay | Admitting: Hematology & Oncology

## 2018-05-05 ENCOUNTER — Telehealth: Payer: Self-pay | Admitting: Hematology & Oncology

## 2018-05-05 NOTE — Telephone Encounter (Signed)
Called and spoke with patient regarding appointment for PET scan date/time/location/ NPO 6 hrs

## 2018-05-07 ENCOUNTER — Telehealth: Payer: Self-pay | Admitting: Hematology & Oncology

## 2018-05-07 NOTE — Telephone Encounter (Signed)
Appointments scheduled after PET and patient notified per 2/20 staff message

## 2018-05-13 ENCOUNTER — Encounter: Payer: Self-pay | Admitting: *Deleted

## 2018-05-14 ENCOUNTER — Encounter (HOSPITAL_COMMUNITY)
Admission: RE | Admit: 2018-05-14 | Discharge: 2018-05-14 | Disposition: A | Discharge status: Home / Self Care | Payer: 59 | Source: Ambulatory Visit | Attending: Hematology & Oncology | Admitting: Hematology & Oncology

## 2018-05-14 DIAGNOSIS — Z1211 Encounter for screening for malignant neoplasm of colon: Secondary | ICD-10-CM | POA: Insufficient documentation

## 2018-05-14 LAB — GLUCOSE, CAPILLARY: Glucose-Capillary: 77 mg/dL (ref 70–99)

## 2018-05-14 MED ORDER — FLUDEOXYGLUCOSE F - 18 (FDG) INJECTION
9.3500 | Freq: Once | INTRAVENOUS | Status: AC
  Administered 2018-05-14: 9.35 via INTRAVENOUS

## 2018-05-15 ENCOUNTER — Telehealth: Payer: Self-pay | Admitting: *Deleted

## 2018-05-15 NOTE — Telephone Encounter (Addendum)
Patient is aware of results  ----- Message from [name], MD sent at 05/15/2018  7:03 AM EST ----- Call - the PET scan is actually normal!!!  NO active cancer in the liver, lungs, lymph nodes!!!  This is fantastic!!  Laurey Arrow

## 2018-05-19 ENCOUNTER — Inpatient Hospital Stay: Payer: 59

## 2018-05-19 ENCOUNTER — Inpatient Hospital Stay: Payer: 59 | Attending: Hematology & Oncology

## 2018-05-19 ENCOUNTER — Inpatient Hospital Stay (HOSPITAL_BASED_OUTPATIENT_CLINIC_OR_DEPARTMENT_OTHER): Payer: 59 | Admitting: Family

## 2018-05-19 ENCOUNTER — Other Ambulatory Visit: Payer: Self-pay

## 2018-05-19 ENCOUNTER — Telehealth: Payer: Self-pay | Admitting: Family

## 2018-05-19 ENCOUNTER — Encounter: Payer: Self-pay | Admitting: Family

## 2018-05-19 VITALS — BP 129/84 | HR 75 | Temp 98.2°F | Resp 16 | Wt 192.1 lb

## 2018-05-19 DIAGNOSIS — Z79899 Other long term (current) drug therapy: Secondary | ICD-10-CM

## 2018-05-19 DIAGNOSIS — C160 Malignant neoplasm of cardia: Secondary | ICD-10-CM

## 2018-05-19 DIAGNOSIS — C799 Secondary malignant neoplasm of unspecified site: Principal | ICD-10-CM

## 2018-05-19 DIAGNOSIS — Z5112 Encounter for antineoplastic immunotherapy: Secondary | ICD-10-CM | POA: Insufficient documentation

## 2018-05-19 DIAGNOSIS — Z5111 Encounter for antineoplastic chemotherapy: Secondary | ICD-10-CM | POA: Insufficient documentation

## 2018-05-19 DIAGNOSIS — G629 Polyneuropathy, unspecified: Secondary | ICD-10-CM | POA: Insufficient documentation

## 2018-05-19 DIAGNOSIS — I427 Cardiomyopathy due to drug and external agent: Secondary | ICD-10-CM

## 2018-05-19 DIAGNOSIS — T451X5A Adverse effect of antineoplastic and immunosuppressive drugs, initial encounter: Secondary | ICD-10-CM

## 2018-05-19 LAB — CBC WITH DIFFERENTIAL (CANCER CENTER ONLY)
Abs Immature Granulocytes: 0.02 10*3/uL (ref 0.00–0.07)
Basophils Absolute: 0 10*3/uL (ref 0.0–0.1)
Basophils Relative: 1 %
Eosinophils Absolute: 0.1 10*3/uL (ref 0.0–0.5)
Eosinophils Relative: 3 %
HCT: 45.5 % (ref 39.0–52.0)
Hemoglobin: 15 g/dL (ref 13.0–17.0)
Immature Granulocytes: 1 %
Lymphocytes Relative: 36 %
Lymphs Abs: 1.3 10*3/uL (ref 0.7–4.0)
MCH: 28.6 pg (ref 26.0–34.0)
MCHC: 33 g/dL (ref 30.0–36.0)
MCV: 86.8 fL (ref 80.0–100.0)
Monocytes Absolute: 0.3 10*3/uL (ref 0.1–1.0)
Monocytes Relative: 8 %
Neutro Abs: 2 10*3/uL (ref 1.7–7.7)
Neutrophils Relative %: 51 %
PLATELETS: 144 10*3/uL — AB (ref 150–400)
RBC: 5.24 MIL/uL (ref 4.22–5.81)
RDW: 15 % (ref 11.5–15.5)
WBC Count: 3.7 10*3/uL — ABNORMAL LOW (ref 4.0–10.5)
nRBC: 0 % (ref 0.0–0.2)

## 2018-05-19 LAB — CMP (CANCER CENTER ONLY)
ALT: 25 U/L (ref 0–44)
AST: 25 U/L (ref 15–41)
Albumin: 4.1 g/dL (ref 3.5–5.0)
Alkaline Phosphatase: 88 U/L (ref 38–126)
Anion gap: 10 (ref 5–15)
BUN: 13 mg/dL (ref 6–20)
CO2: 27 mmol/L (ref 22–32)
Calcium: 9.1 mg/dL (ref 8.9–10.3)
Chloride: 104 mmol/L (ref 98–111)
Creatinine: 0.92 mg/dL (ref 0.61–1.24)
GFR, Est AFR Am: >60 mL/min (ref >60)
GFR, Estimated: >60 mL/min (ref >60)
GLUCOSE: 158 mg/dL — AB (ref 70–99)
Potassium: 3.7 mmol/L (ref 3.5–5.1)
Sodium: 141 mmol/L (ref 135–145)
Total Bilirubin: 0.4 mg/dL (ref 0.3–1.2)
Total Protein: 6.6 g/dL (ref 6.5–8.1)

## 2018-05-19 LAB — IRON AND TIBC
Iron: 83 ug/dL (ref 42–163)
Saturation Ratios: 27 % (ref 20–55)
TIBC: 304 ug/dL (ref 202–409)
UIBC: 221 ug/dL (ref 117–376)

## 2018-05-19 LAB — FERRITIN: Ferritin: 70 ng/mL (ref 24–336)

## 2018-05-19 LAB — CEA (IN HOUSE-CHCC): CEA (CHCC-In House): 1.88 ng/mL (ref 0.00–5.00)

## 2018-05-19 MED ORDER — PALONOSETRON HCL INJECTION 0.25 MG/5ML
INTRAVENOUS | Status: AC
  Filled 2018-05-19: qty 5

## 2018-05-19 MED ORDER — SODIUM CHLORIDE 0.9% FLUSH
10.0000 mL | INTRAVENOUS | Status: DC | PRN
  Filled 2018-05-19: qty 10

## 2018-05-19 MED ORDER — DIPHENHYDRAMINE HCL 25 MG PO CAPS
ORAL_CAPSULE | ORAL | Status: AC
  Filled 2018-05-19: qty 2

## 2018-05-19 MED ORDER — ACETAMINOPHEN 325 MG PO TABS
ORAL_TABLET | ORAL | Status: AC
  Filled 2018-05-19: qty 2

## 2018-05-19 MED ORDER — ACETAMINOPHEN 325 MG PO TABS
650.0000 mg | ORAL_TABLET | Freq: Once | ORAL | Status: AC
  Administered 2018-05-19: 650 mg via ORAL

## 2018-05-19 MED ORDER — HEPARIN SOD (PORK) LOCK FLUSH 100 UNIT/ML IV SOLN
500.0000 [IU] | Freq: Once | INTRAVENOUS | Status: DC | PRN
  Filled 2018-05-19: qty 5

## 2018-05-19 MED ORDER — FLUOROURACIL CHEMO INJECTION 2.5 GM/50ML
400.0000 mg/m2 | Freq: Once | INTRAVENOUS | Status: AC
  Administered 2018-05-19: 850 mg via INTRAVENOUS
  Filled 2018-05-19: qty 17

## 2018-05-19 MED ORDER — SODIUM CHLORIDE 0.9 % IV SOLN
2400.0000 mg/m2 | INTRAVENOUS | Status: DC
  Administered 2018-05-19: 5000 mg via INTRAVENOUS
  Filled 2018-05-19: qty 100

## 2018-05-19 MED ORDER — SODIUM CHLORIDE 0.9 % IV SOLN
Freq: Once | INTRAVENOUS | Status: AC
  Administered 2018-05-19: 11:00:00 via INTRAVENOUS
  Filled 2018-05-19: qty 250

## 2018-05-19 MED ORDER — PALONOSETRON HCL INJECTION 0.25 MG/5ML
0.2500 mg | Freq: Once | INTRAVENOUS | Status: AC
  Administered 2018-05-19: 0.25 mg via INTRAVENOUS

## 2018-05-19 MED ORDER — DEXAMETHASONE SODIUM PHOSPHATE 10 MG/ML IJ SOLN
INTRAMUSCULAR | Status: AC
  Filled 2018-05-19: qty 1

## 2018-05-19 MED ORDER — TRASTUZUMAB CHEMO 150 MG IV SOLR
4.0000 mg/kg | Freq: Once | INTRAVENOUS | Status: AC
  Administered 2018-05-19: 357 mg via INTRAVENOUS
  Filled 2018-05-19: qty 17

## 2018-05-19 MED ORDER — DEXTROSE 5 % IV SOLN
Freq: Once | INTRAVENOUS | Status: AC
  Administered 2018-05-19: 10:00:00 via INTRAVENOUS
  Filled 2018-05-19: qty 250

## 2018-05-19 MED ORDER — DEXAMETHASONE SODIUM PHOSPHATE 10 MG/ML IJ SOLN
10.0000 mg | Freq: Once | INTRAMUSCULAR | Status: AC
  Administered 2018-05-19: 10 mg via INTRAVENOUS

## 2018-05-19 MED ORDER — LEUCOVORIN CALCIUM INJECTION 350 MG
400.0000 mg/m2 | Freq: Once | INTRAVENOUS | Status: AC
  Administered 2018-05-19: 832 mg via INTRAVENOUS
  Filled 2018-05-19: qty 41.6

## 2018-05-19 MED ORDER — DIPHENHYDRAMINE HCL 25 MG PO CAPS
50.0000 mg | ORAL_CAPSULE | Freq: Once | ORAL | Status: AC
  Administered 2018-05-19: 50 mg via ORAL

## 2018-05-19 NOTE — Progress Notes (Signed)
05/19/18  Confirmed with Dr Marin Olp he wants to omit oxaliplatin today and give 5FU/leucovorin at higher doses of Leucovorin 400 mg/m2, 5FU IV push at 400 mg/m2, and 5FU pump at 2400 mg/m2.  ECHO q 6 months to get appt for April 2020.  V.O. Dr Marlene Lard RN/Deidre Carino Ronnald Ramp, PharmD

## 2018-05-19 NOTE — Patient Instructions (Signed)
Implanted Port Insertion, Care After  This sheet gives you information about how to care for yourself after your procedure. Your health care provider may also give you more specific instructions. If you have problems or questions, contact your health care provider.  What can I expect after the procedure?  After the procedure, it is common to have:  · Discomfort at the port insertion site.  · Bruising on the skin over the port. This should improve over 3-4 days.  Follow these instructions at home:  Port care  · After your port is placed, you will get a manufacturer's information card. The card has information about your port. Keep this card with you at all times.  · Take care of the port as told by your health care provider. Ask your health care provider if you or a family member can get training for taking care of the port at home. A home health care nurse may also take care of the port.  · Make sure to remember what type of port you have.  Incision care         · Follow instructions from your health care provider about how to take care of your port insertion site. Make sure you:  ? Wash your hands with soap and water before and after you change your bandage (dressing). If soap and water are not available, use hand sanitizer.  ? Change your dressing as told by your health care provider.  ? Leave stitches (sutures), skin glue, or adhesive strips in place. These skin closures may need to stay in place for 2 weeks or longer. If adhesive strip edges start to loosen and curl up, you may trim the loose edges. Do not remove adhesive strips completely unless your health care provider tells you to do that.  · Check your port insertion site every day for signs of infection. Check for:  ? Redness, swelling, or pain.  ? Fluid or blood.  ? Warmth.  ? Pus or a bad smell.  Activity  · Return to your normal activities as told by your health care provider. Ask your health care provider what activities are safe for you.  · Do not  lift anything that is heavier than 10 lb (4.5 kg), or the limit that you are told, until your health care provider says that it is safe.  General instructions  · Take over-the-counter and prescription medicines only as told by your health care provider.  · Do not take baths, swim, or use a hot tub until your health care provider approves. Ask your health care provider if you may take showers. You may only be allowed to take sponge baths.  · Do not drive for 24 hours if you were given a sedative during your procedure.  · Wear a medical alert bracelet in case of an emergency. This will tell any health care providers that you have a port.  · Keep all follow-up visits as told by your health care provider. This is important.  Contact a health care provider if:  · You cannot flush your port with saline as directed, or you cannot draw blood from the port.  · You have a fever or chills.  · You have redness, swelling, or pain around your port insertion site.  · You have fluid or blood coming from your port insertion site.  · Your port insertion site feels warm to the touch.  · You have pus or a bad smell coming from the port   insertion site.  Get help right away if:  · You have chest pain or shortness of breath.  · You have bleeding from your port that you cannot control.  Summary  · Take care of the port as told by your health care provider. Keep the manufacturer's information card with you at all times.  · Change your dressing as told by your health care provider.  · Contact a health care provider if you have a fever or chills or if you have redness, swelling, or pain around your port insertion site.  · Keep all follow-up visits as told by your health care provider.  This information is not intended to replace advice given to you by your health care provider. Make sure you discuss any questions you have with your health care provider.  Document Released: 12/23/2012 Document Revised: 09/30/2017 Document Reviewed:  09/30/2017  [employer] Interactive Patient Education © 2019 [employer]

## 2018-05-19 NOTE — Patient Instructions (Signed)
[facility] Discharge Instructions for Patients Receiving Chemotherapy  Today you received the following chemotherapy agents 5FU, Leucovorin, Herceptin  To help prevent nausea and vomiting after your treatment, we encourage you to take your nausea medication    If you develop nausea and vomiting that is not controlled by your nausea medication, call the clinic.   BELOW ARE SYMPTOMS THAT SHOULD BE REPORTED IMMEDIATELY:  *FEVER GREATER THAN 100.5 F  *CHILLS WITH OR WITHOUT FEVER  NAUSEA AND VOMITING THAT IS NOT CONTROLLED WITH YOUR NAUSEA MEDICATION  *UNUSUAL SHORTNESS OF BREATH  *UNUSUAL BRUISING OR BLEEDING  TENDERNESS IN MOUTH AND THROAT WITH OR WITHOUT PRESENCE OF ULCERS  *URINARY PROBLEMS  *BOWEL PROBLEMS  UNUSUAL RASH Items with * indicate a potential emergency and should be followed up as soon as possible.  Feel free to call the clinic should you have any questions or concerns. The clinic phone number is [phone].  Please show the [facility] at check-in to the Emergency Department and triage nurse.

## 2018-05-19 NOTE — Progress Notes (Signed)
Hematology and Oncology Follow Up Visit  Matthew Reynolds [id number] 11-09-1965 53 y.o. 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  His PET scan last week showed a nice response to treatment with no activity in the liver, lungs or lymph nodes.  His counts have improved. Hgb is 15.0, WBC 3.7 and platelets 144.  No episodes of bleeding, no bruising or petechiae.  No fever, chills, n/v, cough, rash, dizziness, SOB, chest pain, palpitations, abdominal pain or changes in bowel or bladder habits.  No swelling or tenderness in his extremities. He has occasional numbness and tingling in his fingertips. This comes and goes.  No lymphadenopathy noted on exam.  He has maintained a good appetite. No problems swallowing.  He is staying well hydrated. His weight is stable.   ECOG Performance Status: 1 - Symptomatic but completely ambulatory  Medications:  Allergies as of 05/19/2018   No Known Allergies     Medication List       Accurate as of May 19, 2018  9:15 AM. Always use your most recent med list.        dexamethasone 4 MG tablet Commonly known as:  DECADRON Take 2 tablets (8 mg total) by mouth daily. Start the day after chemotherapy for 2 days. Take with food.   DEXILANT 60 MG capsule Generic drug:  dexlansoprazole Take 60 mg by mouth daily.   lidocaine-prilocaine cream Commonly known as:  EMLA Apply to affected area once   ondansetron 8 MG tablet Commonly known as:  ZOFRAN Take 1 tablet (8 mg total) by mouth 2 (two) times daily as needed for refractory nausea / vomiting. Start on day 3 after chemotherapy.   polyethylene glycol packet Commonly known as:  MIRALAX / GLYCOLAX Take 17 g by mouth 2 (two) times daily.   prochlorperazine 10 MG tablet Commonly known as:  COMPAZINE Take 1  tablet (10 mg total) by mouth every 6 (six) hours as needed (Nausea or vomiting).       Allergies: No Known Allergies  Past Medical History, Surgical history, Social history, and Family History were reviewed and updated.  Review of Systems: All other 10 point review of systems is negative.   Physical Exam:  weight is 192 lb 1.6 oz (87.1 kg). His oral temperature is 98.2 F (36.8 C). His blood pressure is 129/84 and his pulse is 75. His respiration is 16 and oxygen saturation is 98%.   Wt Readings from Last 3 Encounters:  05/19/18 192 lb 1.6 oz (87.1 kg)  04/29/18 186 lb 4 oz (84.5 kg)  04/15/18 185 lb 12.8 oz (84.3 kg)    Ocular: Sclerae unicteric, pupils equal, round and reactive to light Ear-nose-throat: Oropharynx clear, dentition fair Lymphatic: No cervical, supraclavicular or axillary adenopathy Lungs no rales or rhonchi, good excursion bilaterally Heart regular rate and rhythm, no murmur appreciated Abd soft, nontender, positive bowel sounds, no liver or spleen tip palpated on exam, no fluid wave  MSK no focal spinal tenderness, no joint edema Neuro: non-focal, well-oriented, appropriate affect Breasts: Deferred   Lab Results  Component Value Date   WBC 3.7 (L) 04/29/2018   HGB 15.1 04/29/2018   HCT 46.2 04/29/2018   MCV 85.9 04/29/2018   PLT 63 (L) 04/29/2018   Lab Results  Component Value Date   FERRITIN 163 04/29/2018  IRON 89 04/29/2018   TIBC 313 04/29/2018   UIBC 224 04/29/2018   IRONPCTSAT 28 04/29/2018   Lab Results  Component Value Date   RBC 5.38 04/29/2018   No results found for: KPAFRELGTCHN, LAMBDASER, KAPLAMBRATIO No results found for: IGGSERUM, IGA, IGMSERUM No results found for: Ronnald Ramp, A1GS, A2GS, Tillman Sers, SPEI   Chemistry      Component Value Date/Time   NA 140 04/29/2018 1040   K 3.8 04/29/2018 1040   CL 105 04/29/2018 1040   CO2 27 04/29/2018 1040   BUN 8 04/29/2018 1040   CREATININE 0.99  04/29/2018 1040      Component Value Date/Time   CALCIUM 9.5 04/29/2018 1040   ALKPHOS 91 04/29/2018 1040   AST 34 04/29/2018 1040   ALT 35 04/29/2018 1040   BILITOT 0.5 04/29/2018 1040       [name] is a very pleasant 53 yo caucasian gentleman with metastatic adenocarcinoma of the GE junction, HER-2 positive.  PET scan last week showed a nice response to treatment.  We will proceed with treatment today as planned but will omit the Oxaliplatin due to previous symptoms per [name].  We will plan to complete another 4 cycles.  We will also get him set up for an ECHO in the next couple weeks.  He will get a new schedule today and we will see him back in another 2 weeks.  They will contact our office with any questions or concerns. We can certainly see her sooner if need be.   Laverna Peace, NP 3/3/20209:15 AM

## 2018-05-19 NOTE — Telephone Encounter (Signed)
Needs ECHO in 2 weeks please. Thank you!/ ECHO appt has been scheduled already/ per 3/3 los

## 2018-05-21 ENCOUNTER — Other Ambulatory Visit: Payer: Self-pay | Admitting: Family

## 2018-05-21 ENCOUNTER — Other Ambulatory Visit: Payer: Self-pay

## 2018-05-21 ENCOUNTER — Inpatient Hospital Stay: Payer: 59

## 2018-05-21 VITALS — BP 130/97 | HR 68 | Temp 98.0°F | Resp 20

## 2018-05-21 DIAGNOSIS — Z5111 Encounter for antineoplastic chemotherapy: Secondary | ICD-10-CM

## 2018-05-21 DIAGNOSIS — Z5112 Encounter for antineoplastic immunotherapy: Secondary | ICD-10-CM | POA: Diagnosis not present

## 2018-05-21 DIAGNOSIS — C799 Secondary malignant neoplasm of unspecified site: Principal | ICD-10-CM

## 2018-05-21 DIAGNOSIS — C160 Malignant neoplasm of cardia: Secondary | ICD-10-CM

## 2018-05-21 MED ORDER — SODIUM CHLORIDE 0.9% FLUSH
10.0000 mL | INTRAVENOUS | Status: DC | PRN
  Administered 2018-05-21: 10 mL
  Filled 2018-05-21: qty 10

## 2018-05-21 MED ORDER — HEPARIN SOD (PORK) LOCK FLUSH 100 UNIT/ML IV SOLN
500.0000 [IU] | Freq: Once | INTRAVENOUS | Status: AC | PRN
  Administered 2018-05-21: 500 [IU]
  Filled 2018-05-21: qty 5

## 2018-05-21 NOTE — Patient Instructions (Signed)
Fluorouracil, 5-FU injection What is this medicine? FLUOROURACIL, 5-FU (flure oh YOOR a sil) is a chemotherapy drug. It slows the growth of cancer cells. This medicine is used to treat many types of cancer like breast cancer, colon or rectal cancer, pancreatic cancer, and stomach cancer. This medicine may be used for other purposes; ask your health care provider or pharmacist if you have questions. COMMON BRAND NAME(S): Adrucil What should I tell my health care provider before I take this medicine? They need to know if you have any of these conditions: -blood disorders -dihydropyrimidine dehydrogenase (DPD) deficiency -infection (especially a virus infection such as chickenpox, cold sores, or herpes) -kidney disease -liver disease -malnourished, poor nutrition -recent or ongoing radiation therapy -an unusual or allergic reaction to fluorouracil, other chemotherapy, other medicines, foods, dyes, or preservatives -pregnant or trying to get pregnant -breast-feeding How should I use this medicine? This drug is given as an infusion or injection into a vein. It is administered in a hospital or clinic by a specially trained health care professional. Talk to your pediatrician regarding the use of this medicine in children. Special care may be needed. Overdosage: If you think you have taken too much of this medicine contact a poison control center or emergency room at once. NOTE: This medicine is only for you. Do not share this medicine with others. What if I miss a dose? It is important not to miss your dose. Call your doctor or health care professional if you are unable to keep an appointment. What may interact with this medicine? -allopurinol -cimetidine -dapsone -digoxin -hydroxyurea -leucovorin -levamisole -medicines for seizures like ethotoin, fosphenytoin, phenytoin -medicines to increase blood counts like filgrastim, pegfilgrastim, sargramostim -medicines that treat or prevent blood  clots like warfarin, enoxaparin, and dalteparin -methotrexate -metronidazole -pyrimethamine -some other chemotherapy drugs like busulfan, cisplatin, estramustine, vinblastine -trimethoprim -trimetrexate -vaccines Talk to your doctor or health care professional before taking any of these medicines: -acetaminophen -aspirin -ibuprofen -ketoprofen -naproxen This list may not describe all possible interactions. Give your health care provider a list of all the medicines, herbs, non-prescription drugs, or dietary supplements you use. Also tell them if you smoke, drink alcohol, or use illegal drugs. Some items may interact with your medicine. What should I watch for while using this medicine? Visit your doctor for checks on your progress. This drug may make you feel generally unwell. This is not uncommon, as chemotherapy can affect healthy cells as well as cancer cells. Report any side effects. Continue your course of treatment even though you feel ill unless your doctor tells you to stop. In some cases, you may be given additional medicines to help with side effects. Follow all directions for their use. Call your doctor or health care professional for advice if you get a fever, chills or sore throat, or other symptoms of a cold or flu. Do not treat yourself. This drug decreases your body's ability to fight infections. Try to avoid being around people who are sick. This medicine may increase your risk to bruise or bleed. Call your doctor or health care professional if you notice any unusual bleeding. Be careful brushing and flossing your teeth or using a toothpick because you may get an infection or bleed more easily. If you have any dental work done, tell your dentist you are receiving this medicine. Avoid taking products that contain aspirin, acetaminophen, ibuprofen, naproxen, or ketoprofen unless instructed by your doctor. These medicines may hide a fever. Do not become pregnant while taking this  medicine. Women should inform their doctor if they wish to become pregnant or think they might be pregnant. There is a potential for serious side effects to an unborn child. Talk to your health care professional or pharmacist for more information. Do not breast-feed an infant while taking this medicine. Men should inform their doctor if they wish to father a child. This medicine may lower sperm counts. Do not treat diarrhea with over the counter products. Contact your doctor if you have diarrhea that lasts more than 2 days or if it is severe and watery. This medicine can make you more sensitive to the sun. Keep out of the sun. If you cannot avoid being in the sun, wear protective clothing and use sunscreen. Do not use sun lamps or tanning beds/booths. What side effects may I notice from receiving this medicine? Side effects that you should report to your doctor or health care professional as soon as possible: -allergic reactions like skin rash, itching or hives, swelling of the face, lips, or tongue -low blood counts - this medicine may decrease the number of white blood cells, red blood cells and platelets. You may be at increased risk for infections and bleeding. -signs of infection - fever or chills, cough, sore throat, pain or difficulty passing urine -signs of decreased platelets or bleeding - bruising, pinpoint red spots on the skin, black, tarry stools, blood in the urine -signs of decreased red blood cells - unusually weak or tired, fainting spells, lightheadedness -breathing problems -changes in vision -chest pain -mouth sores -nausea and vomiting -pain, swelling, redness at site where injected -pain, tingling, numbness in the hands or feet -redness, swelling, or sores on hands or feet -stomach pain -unusual bleeding Side effects that usually do not require medical attention (report to your doctor or health care professional if they continue or are bothersome): -changes in finger or  toe nails -diarrhea -dry or itchy skin -hair loss -headache -loss of appetite -sensitivity of eyes to the light -stomach upset -unusually teary eyes This list may not describe all possible side effects. Call your doctor for medical advice about side effects. You may report side effects to FDA at 1-800-FDA-1088. Where should I keep my medicine? This drug is given in a hospital or clinic and will not be stored at home. NOTE: This sheet is a summary. It may not cover all possible information. If you have questions about this medicine, talk to your doctor, pharmacist, or health care provider.  2019 Elsevier/Gold Standard (2007-07-08 13:53:16)

## 2018-05-22 ENCOUNTER — Other Ambulatory Visit: Payer: Self-pay | Admitting: Hematology & Oncology

## 2018-05-22 ENCOUNTER — Telehealth: Payer: Self-pay | Admitting: Pharmacist

## 2018-05-22 DIAGNOSIS — C799 Secondary malignant neoplasm of unspecified site: Principal | ICD-10-CM

## 2018-05-22 DIAGNOSIS — C160 Malignant neoplasm of cardia: Secondary | ICD-10-CM

## 2018-05-22 MED ORDER — CAPECITABINE 500 MG PO TABS: 1250.0000 mg/m2 | ORAL_TABLET | Freq: Two times a day (BID) | ORAL | 6 refills | Status: DC

## 2018-05-22 NOTE — Telephone Encounter (Signed)
Oral Oncology Pharmacist Encounter  Received new prescription for Xeloda (capecitabine) for the treatment of metastatic adenocarcinoma of the GE junction in conjunction with trastuzumab, planned duration until disease progression or unacceptable drug toxicity. Capecitabine will be replacing his 5-FU infusion.   CMP from 05/19/2018 assessed, no relevant lab abnormalities. Prescription dose and frequency assessed.   Current medication list in Epic reviewed, one relevant DDIs with capecitabine identified: - Dexlansoprazole: Proton Pump Inhibitors (PPI) may diminish the therapeutic effect of capecitabine. Recommend evaluating the need for a PPI/acid suppression. If acid suppression is needed, recommend switching to a H2 antagonist (eg, ranitidine) to avoid this DDI.   Prescription has been e-scribed to the [facility] for benefits analysis and approval.  Oral Oncology Clinic will continue to follow for insurance authorization, copayment issues, initial counseling and start date.  Darl Pikes, PharmD, BCPS, [facility] Hematology/Oncology Clinical Pharmacist [facility] [phone]  05/22/2018 4:07 PM

## 2018-05-25 ENCOUNTER — Ambulatory Visit (HOSPITAL_BASED_OUTPATIENT_CLINIC_OR_DEPARTMENT_OTHER)
Admission: RE | Admit: 2018-05-25 | Discharge: 2018-05-25 | Disposition: A | Discharge status: Home / Self Care | Payer: 59 | Source: Ambulatory Visit | Attending: Family | Admitting: Family

## 2018-05-25 ENCOUNTER — Telehealth: Payer: Self-pay | Admitting: Pharmacy Technician

## 2018-05-25 DIAGNOSIS — C160 Malignant neoplasm of cardia: Secondary | ICD-10-CM | POA: Insufficient documentation

## 2018-05-25 DIAGNOSIS — T451X5A Adverse effect of antineoplastic and immunosuppressive drugs, initial encounter: Secondary | ICD-10-CM | POA: Insufficient documentation

## 2018-05-25 DIAGNOSIS — Z5111 Encounter for antineoplastic chemotherapy: Secondary | ICD-10-CM | POA: Diagnosis present

## 2018-05-25 DIAGNOSIS — C799 Secondary malignant neoplasm of unspecified site: Secondary | ICD-10-CM | POA: Insufficient documentation

## 2018-05-25 DIAGNOSIS — I427 Cardiomyopathy due to drug and external agent: Secondary | ICD-10-CM | POA: Diagnosis not present

## 2018-05-25 MED ORDER — CAPECITABINE 500 MG PO TABS: 1250.0000 mg/m2 | ORAL_TABLET | Freq: Two times a day (BID) | ORAL | 6 refills | Status: DC

## 2018-05-25 NOTE — Progress Notes (Signed)
  Echocardiogram 2D Echocardiogram has been performed.  Andreas Sobolewski T Donabelle Molden 05/25/2018, 11:27 AM

## 2018-05-25 NOTE — Telephone Encounter (Signed)
Oral Oncology Patient Advocate Encounter  Received notification from [employer] that prior authorization for Xeloda (Capecitabine) is required.  PA submitted on CoverMyMeds Key [id number] Status is pending  Oral Oncology Clinic will continue to follow.  [place] Patient [place] Phone [phone] Fax [phone] 05/25/2018 3:10 PM

## 2018-05-27 MED ORDER — XELODA 500 MG PO TABS: 1250.0000 mg/m2 | ORAL_TABLET | Freq: Two times a day (BID) | ORAL | 6 refills | Status: DC

## 2018-05-27 NOTE — Telephone Encounter (Signed)
Oral Oncology Patient Advocate Encounter  Received notification from [employer] that Capecitabine prior authorization is not approved because the patient has not failed Brand Xeloda.  Also patient must fill Xeloda through [facility].  We will send prescription and follow up until patient received medication.

## 2018-05-27 NOTE — Telephone Encounter (Signed)
Oral Chemotherapy Pharmacist Encounter  Due to insurance restriction the medication could not be filled at [facility]. Prescription has been e-scribed to [facility].  Supportive information was faxed to [facility]. We will continue to follow medication access.   Darl Pikes, PharmD, BCPS, [facility] Hematology/Oncology Clinical Pharmacist [facility] [phone]  05/27/2018 11:45 AM

## 2018-05-28 NOTE — Telephone Encounter (Signed)
Per Candice at [facility], patients copay is $10.  Matthew Reynolds is waiting for the pharmacist to verify and release the prescription and they will reach out to the patient.  [employer] patient line for refills and deliveries: [phone].

## 2018-05-29 NOTE — Telephone Encounter (Signed)
Oral Chemotherapy Pharmacist Encounter  Call [facility]/[employer] to check on the status of Matthew Reynolds Xeloda. They stated they left a VM for him to call to fill his medication. Conferenced in Matthew Reynolds to the call with [employer] to ensure the medication fill was completed. Instructed Matthew Reynolds to bring the medication with him to his next appt.  Patient Education I spoke with patient for overview of new oral chemotherapy medication: Xeloda (capecitabine) for the treatment of metastatic adenocarcinoma of the GE junction in conjunction with trastuzumab, planned duration until disease progression or unacceptable drug toxicity. Capecitabine will be replacing his 5-FU infusion.   Pt is doing well. Counseled patient on administration, dosing, side effects, monitoring, drug-food interactions, safe handling, storage, and disposal. Patient will take 5 tablets (2,500 mg total) by mouth 2 (two) times daily after a meal. Take for 14 days, then hold for 7 days. Repeat every 21 days.  Side effects include but not limited to: diarrhea, hand-foot syndrome, N/V, decreased wbc/plt/hgb, fatigue, mouth sores.    Reviewed with patient importance of keeping a medication schedule and plan for any missed doses.  Matthew Reynolds voiced understanding and appreciation. All questions answered. Medication handout placed in the mail.  Provided patient with Oral [facility] phone number. Patient knows to call the office with questions or concerns. Oral Chemotherapy Navigation Clinic will continue to follow.  Darl Pikes, PharmD, BCPS, [facility] Hematology/Oncology Clinical Pharmacist [facility] [phone]  05/29/2018 2:45 PM

## 2018-06-01 ENCOUNTER — Telehealth: Payer: Self-pay | Admitting: *Deleted

## 2018-06-01 NOTE — Telephone Encounter (Signed)
Medication is scheduled to be delivered today to patient.  Alyson instructed patient to bring to appointment.

## 2018-06-01 NOTE — Telephone Encounter (Signed)
As noted below by Judson Roch [place],NP, I informed the patient he has a heart of a champion. He verbalized understanding.

## 2018-06-01 NOTE — Telephone Encounter (Signed)
-----   Message from Eliezer Bottom, NP sent at 06/01/2018  9:20 AM EDT ----- He still has the heart of a champion!! Thank you!!  Sarah ----- Message ----- From: Interface, [employer] Sent: 05/25/2018   4:48 PM EDT To: Eliezer Bottom, NP

## 2018-06-02 ENCOUNTER — Inpatient Hospital Stay: Payer: 59

## 2018-06-02 ENCOUNTER — Encounter: Payer: Self-pay | Admitting: Hematology & Oncology

## 2018-06-02 ENCOUNTER — Inpatient Hospital Stay (HOSPITAL_BASED_OUTPATIENT_CLINIC_OR_DEPARTMENT_OTHER): Payer: 59 | Admitting: Hematology & Oncology

## 2018-06-02 ENCOUNTER — Other Ambulatory Visit: Payer: Self-pay

## 2018-06-02 VITALS — BP 137/83 | HR 58 | Temp 97.6°F | Resp 16 | Wt 190.0 lb

## 2018-06-02 DIAGNOSIS — D500 Iron deficiency anemia secondary to blood loss (chronic): Secondary | ICD-10-CM

## 2018-06-02 DIAGNOSIS — Z5112 Encounter for antineoplastic immunotherapy: Secondary | ICD-10-CM | POA: Diagnosis not present

## 2018-06-02 DIAGNOSIS — C160 Malignant neoplasm of cardia: Secondary | ICD-10-CM

## 2018-06-02 DIAGNOSIS — C799 Secondary malignant neoplasm of unspecified site: Principal | ICD-10-CM

## 2018-06-02 DIAGNOSIS — Z79899 Other long term (current) drug therapy: Secondary | ICD-10-CM | POA: Diagnosis not present

## 2018-06-02 DIAGNOSIS — G629 Polyneuropathy, unspecified: Secondary | ICD-10-CM

## 2018-06-02 LAB — CMP (CANCER CENTER ONLY)
ALT: 23 U/L (ref 0–44)
AST: 22 U/L (ref 15–41)
Albumin: 4 g/dL (ref 3.5–5.0)
Alkaline Phosphatase: 80 U/L (ref 38–126)
Anion gap: 7 (ref 5–15)
BUN: 12 mg/dL (ref 6–20)
CO2: 28 mmol/L (ref 22–32)
Calcium: 8.6 mg/dL — ABNORMAL LOW (ref 8.9–10.3)
Chloride: 104 mmol/L (ref 98–111)
Creatinine: 0.96 mg/dL (ref 0.61–1.24)
GFR, Est AFR Am: >60 mL/min (ref >60)
GFR, Estimated: >60 mL/min (ref >60)
Glucose, Bld: 124 mg/dL — ABNORMAL HIGH (ref 70–99)
Potassium: 3.7 mmol/L (ref 3.5–5.1)
Sodium: 139 mmol/L (ref 135–145)
Total Bilirubin: 0.5 mg/dL (ref 0.3–1.2)
Total Protein: 6.2 g/dL — ABNORMAL LOW (ref 6.5–8.1)

## 2018-06-02 LAB — CBC WITH DIFFERENTIAL (CANCER CENTER ONLY)
Abs Immature Granulocytes: 0.01 10*3/uL (ref 0.00–0.07)
Basophils Absolute: 0 10*3/uL (ref 0.0–0.1)
Basophils Relative: 1 %
Eosinophils Absolute: 0.1 10*3/uL (ref 0.0–0.5)
Eosinophils Relative: 2 %
HCT: 43 % (ref 39.0–52.0)
Hemoglobin: 14.6 g/dL (ref 13.0–17.0)
IMMATURE GRANULOCYTES: 0 %
Lymphocytes Relative: 33 %
Lymphs Abs: 1.1 10*3/uL (ref 0.7–4.0)
MCH: 29.5 pg (ref 26.0–34.0)
MCHC: 34 g/dL (ref 30.0–36.0)
MCV: 86.9 fL (ref 80.0–100.0)
Monocytes Absolute: 0.3 10*3/uL (ref 0.1–1.0)
Monocytes Relative: 9 %
Neutro Abs: 1.8 10*3/uL (ref 1.7–7.7)
Neutrophils Relative %: 55 %
Platelet Count: 96 10*3/uL — ABNORMAL LOW (ref 150–400)
RBC: 4.95 MIL/uL (ref 4.22–5.81)
RDW: 14.3 % (ref 11.5–15.5)
WBC Count: 3.3 10*3/uL — ABNORMAL LOW (ref 4.0–10.5)
nRBC: 0 % (ref 0.0–0.2)

## 2018-06-02 LAB — LACTATE DEHYDROGENASE: LDH: 180 U/L (ref 98–192)

## 2018-06-02 MED ORDER — TRASTUZUMAB CHEMO 150 MG IV SOLR
6.0000 mg/kg | Freq: Once | INTRAVENOUS | Status: AC
  Administered 2018-06-02: 525 mg via INTRAVENOUS
  Filled 2018-06-02: qty 25

## 2018-06-02 MED ORDER — LIDOCAINE-PRILOCAINE 2.5-2.5 % EX CREA: 1.0000 "application " | TOPICAL_CREAM | CUTANEOUS | 0 refills | Status: DC | PRN

## 2018-06-02 MED ORDER — SODIUM CHLORIDE 0.9% FLUSH
10.0000 mL | INTRAVENOUS | Status: DC | PRN
  Administered 2018-06-02: 10 mL
  Filled 2018-06-02: qty 10

## 2018-06-02 MED ORDER — ACETAMINOPHEN 325 MG PO TABS
650.0000 mg | ORAL_TABLET | Freq: Once | ORAL | Status: AC
  Administered 2018-06-02: 650 mg via ORAL

## 2018-06-02 MED ORDER — FAMOTIDINE 40 MG PO TABS: 40.0000 mg | ORAL_TABLET | Freq: Two times a day (BID) | ORAL | 6 refills | Status: DC

## 2018-06-02 MED ORDER — DIPHENHYDRAMINE HCL 25 MG PO CAPS
50.0000 mg | ORAL_CAPSULE | Freq: Once | ORAL | Status: AC
  Administered 2018-06-02: 50 mg via ORAL

## 2018-06-02 MED ORDER — SODIUM CHLORIDE 0.9 % IV SOLN
Freq: Once | INTRAVENOUS | Status: AC
  Administered 2018-06-02: 11:00:00 via INTRAVENOUS
  Filled 2018-06-02: qty 250

## 2018-06-02 MED ORDER — HEPARIN SOD (PORK) LOCK FLUSH 100 UNIT/ML IV SOLN
500.0000 [IU] | Freq: Once | INTRAVENOUS | Status: AC | PRN
  Administered 2018-06-02: 500 [IU]
  Filled 2018-06-02: qty 5

## 2018-06-02 MED ORDER — VITAMIN B-6 500 MG PO TABS: 500.0000 mg | ORAL_TABLET | Freq: Every day | ORAL | 6 refills | Status: DC

## 2018-06-02 MED FILL — LIDOCAINE-PRILOCAINE CREAM: 2.5-2.5 | 30 days supply | Qty: 30 | Fill #0

## 2018-06-02 MED FILL — FAMOTIDINE 20 MG TABLET: 20 | 30 days supply | Qty: 120 | Fill #0

## 2018-06-02 MED FILL — SM VITAMIN B6 100 MG TABS: 100 | 20 days supply | Qty: 100 | Fill #0

## 2018-06-02 NOTE — Progress Notes (Signed)
Hematology and Oncology Follow Up Visit  Matthew Reynolds [id number] Jun 20, 1965 53 y.o. 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  We now are the point where we are going to do maintenance therapy for him.  I think that we have done enough full dose of chemotherapy.  He is having problems with his blood counts and also problems with neuropathy.  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  I will add some vitamin B6 and see if this helps.  I will start him on Xeloda with Herceptin.  I think this would be a good combination for him.  I think that he would be able to tolerate this quite well.  There is been no problems with nausea or vomiting.  He has had no mouth sores.  He said no abdominal pain.  He has had no change in bowel or bladder habits.  He has had no fever.  Overall, his performance status is ECOG 1.    Medications:  Allergies as of 06/02/2018   No Known Allergies     Medication List       Accurate as of June 02, 2018 11:04 AM. Always use your most recent med list.        Dexilant 60 MG capsule Generic drug:  dexlansoprazole Take 60 mg by mouth daily.   polyethylene glycol packet Commonly known as:  MIRALAX / GLYCOLAX Take 17 g by mouth 2 (two) times daily.   Xeloda 500 MG tablet Generic drug:  capecitabine Take 5 tablets (2,500 mg total) by mouth 2 (two) times daily after a meal. Take for 14 days, then hold for 7 days. Repeat every 21 days.       Allergies: No Known Allergies  Past Medical History, Surgical history, Social history, and Family History were reviewed and updated.  Review of Systems: Review of Systems  Constitutional: Negative.   HENT: Negative.    Eyes: Negative.   Respiratory: Negative.   Cardiovascular: Negative.   Gastrointestinal: Negative.   Genitourinary: Negative.   Musculoskeletal: Negative.   Skin: Negative.   Neurological: Positive for tingling.  Endo/Heme/Allergies: Negative.   Psychiatric/Behavioral: Negative.    Marland Kitchen   Physical Exam:  weight is 190 lb (86.2 kg). His oral temperature is 97.6 F (36.4 C). His blood pressure is 137/83 and his pulse is 58 (abnormal). His respiration is 16 and oxygen saturation is 99%.   Wt Readings from Last 3 Encounters:  06/02/18 190 lb (86.2 kg)  05/19/18 192 lb 1.6 oz (87.1 kg)  04/29/18 186 lb 4 oz (84.5 kg)    Physical Exam Vitals signs reviewed.  HENT:     Head: Normocephalic and atraumatic.  Eyes:     Pupils: Pupils are equal, round, and reactive to light.  Neck:     Musculoskeletal: Normal range of motion.  Cardiovascular:     Rate and Rhythm: Normal rate and regular rhythm.     Heart sounds: Normal heart sounds.  Pulmonary:     Effort: Pulmonary effort is normal.     Breath sounds: Normal breath sounds.  Abdominal:     General: Bowel sounds are normal.     Palpations: Abdomen is soft.  Musculoskeletal: Normal range of motion.        General: No tenderness or deformity.  Lymphadenopathy:     Cervical: No cervical adenopathy.  Skin:    General: Skin is warm and dry.     Findings: No erythema or rash.  Neurological:     Mental Status: He is alert and oriented to person, place, and time.  Psychiatric:        Behavior: Behavior normal.        Thought Content: Thought content normal.        Judgment: Judgment normal.      Lab Results  Component Value Date   WBC 3.3 (L) 06/02/2018   HGB 14.6 06/02/2018   HCT 43.0 06/02/2018   MCV 86.9 06/02/2018   PLT 96 (L) 06/02/2018   Lab Results  Component Value Date   FERRITIN 70 05/19/2018   IRON 83 05/19/2018   TIBC 304 05/19/2018   UIBC 221 05/19/2018   IRONPCTSAT 27 05/19/2018   Lab Results  Component  Value Date   RBC 4.95 06/02/2018   No results found for: KPAFRELGTCHN, LAMBDASER, KAPLAMBRATIO No results found for: IGGSERUM, IGA, IGMSERUM No results found for: Odetta Pink, SPEI   Chemistry      Component Value Date/Time   NA 139 06/02/2018 1015   K 3.7 06/02/2018 1015   CL 104 06/02/2018 1015   CO2 28 06/02/2018 1015   BUN 12 06/02/2018 1015   CREATININE 0.96 06/02/2018 1015      Component Value Date/Time   CALCIUM 8.6 (L) 06/02/2018 1015   ALKPHOS 80 06/02/2018 1015   AST 22 06/02/2018 1015   ALT 23 06/02/2018 1015   BILITOT 0.5 06/02/2018 1015       [name] is a very pleasant 53 yo caucasian gentleman with metastatic adenocarcinoma of the GE junction, HER-2 positive.   The last PET scan showed a nice response to treatment.  We will now proceed with maintenance therapy.  Again, I think that maintenance therapy would be especially helpful for him.  He has had 8 cycles of FOLFOX.  This is become more of a problem with him with symptoms.  We will do the Xeloda for 14 days on and 7 days off.  I will have to make a change in the Herceptin dose.  I will now dose him at 6 mg/kg.  We will do 4 more cycles of the Xeloda/Herceptin and we will see how he does with a PET scan.  Hopefully, he will maintain his remission.  Let him come back in 3 weeks.  I spent about 30 minutes with he and his wife today.  We had to make a change in his protocol.  I did explain the change and why we had to do it.  I then had a spent time counseling him in getting all of his treatments set up.   [name], MD 3/17/202011:04 AM

## 2018-06-02 NOTE — Patient Instructions (Signed)
Implanted Port Insertion, Care After  This sheet gives you information about how to care for yourself after your procedure. Your health care provider may also give you more specific instructions. If you have problems or questions, contact your health care provider.  What can I expect after the procedure?  After the procedure, it is common to have:  · Discomfort at the port insertion site.  · Bruising on the skin over the port. This should improve over 3-4 days.  Follow these instructions at home:  Port care  · After your port is placed, you will get a manufacturer's information card. The card has information about your port. Keep this card with you at all times.  · Take care of the port as told by your health care provider. Ask your health care provider if you or a family member can get training for taking care of the port at home. A home health care nurse may also take care of the port.  · Make sure to remember what type of port you have.  Incision care         · Follow instructions from your health care provider about how to take care of your port insertion site. Make sure you:  ? Wash your hands with soap and water before and after you change your bandage (dressing). If soap and water are not available, use hand sanitizer.  ? Change your dressing as told by your health care provider.  ? Leave stitches (sutures), skin glue, or adhesive strips in place. These skin closures may need to stay in place for 2 weeks or longer. If adhesive strip edges start to loosen and curl up, you may trim the loose edges. Do not remove adhesive strips completely unless your health care provider tells you to do that.  · Check your port insertion site every day for signs of infection. Check for:  ? Redness, swelling, or pain.  ? Fluid or blood.  ? Warmth.  ? Pus or a bad smell.  Activity  · Return to your normal activities as told by your health care provider. Ask your health care provider what activities are safe for you.  · Do not  lift anything that is heavier than 10 lb (4.5 kg), or the limit that you are told, until your health care provider says that it is safe.  General instructions  · Take over-the-counter and prescription medicines only as told by your health care provider.  · Do not take baths, swim, or use a hot tub until your health care provider approves. Ask your health care provider if you may take showers. You may only be allowed to take sponge baths.  · Do not drive for 24 hours if you were given a sedative during your procedure.  · Wear a medical alert bracelet in case of an emergency. This will tell any health care providers that you have a port.  · Keep all follow-up visits as told by your health care provider. This is important.  Contact a health care provider if:  · You cannot flush your port with saline as directed, or you cannot draw blood from the port.  · You have a fever or chills.  · You have redness, swelling, or pain around your port insertion site.  · You have fluid or blood coming from your port insertion site.  · Your port insertion site feels warm to the touch.  · You have pus or a bad smell coming from the port   insertion site.  Get help right away if:  · You have chest pain or shortness of breath.  · You have bleeding from your port that you cannot control.  Summary  · Take care of the port as told by your health care provider. Keep the manufacturer's information card with you at all times.  · Change your dressing as told by your health care provider.  · Contact a health care provider if you have a fever or chills or if you have redness, swelling, or pain around your port insertion site.  · Keep all follow-up visits as told by your health care provider.  This information is not intended to replace advice given to you by your health care provider. Make sure you discuss any questions you have with your health care provider.  Document Released: 12/23/2012 Document Revised: 09/30/2017 Document Reviewed:  09/30/2017  [employer] Interactive Patient Education © 2019 [employer]

## 2018-06-02 NOTE — Patient Instructions (Signed)
Trastuzumab injection for infusion What is this medicine? TRASTUZUMAB (tras TOO zoo mab) is a monoclonal antibody. It is used to treat breast cancer and stomach cancer. This medicine may be used for other purposes; ask your health care provider or pharmacist if you have questions. COMMON BRAND NAME(S): Herceptin, Calla Kicks, OGIVRI What should I tell my health care provider before I take this medicine? They need to know if you have any of these conditions: -heart disease -heart failure -lung or breathing disease, like asthma -an unusual or allergic reaction to trastuzumab, benzyl alcohol, or other medications, foods, dyes, or preservatives -pregnant or trying to get pregnant -breast-feeding How should I use this medicine? This drug is given as an infusion into a vein. It is administered in a hospital or clinic by a specially trained health care professional. Talk to your pediatrician regarding the use of this medicine in children. This medicine is not approved for use in children. Overdosage: If you think you have taken too much of this medicine contact a poison control center or emergency room at once. NOTE: This medicine is only for you. Do not share this medicine with others. What if I miss a dose? It is important not to miss a dose. Call your doctor or health care professional if you are unable to keep an appointment. What may interact with this medicine? This medicine may interact with the following medications: -certain types of chemotherapy, such as daunorubicin, doxorubicin, epirubicin, and idarubicin This list may not describe all possible interactions. Give your health care provider a list of all the medicines, herbs, non-prescription drugs, or dietary supplements you use. Also tell them if you smoke, drink alcohol, or use illegal drugs. Some items may interact with your medicine. What should I watch for while using this medicine? Visit your doctor for checks on your progress. Report  any side effects. Continue your course of treatment even though you feel ill unless your doctor tells you to stop. Call your doctor or health care professional for advice if you get a fever, chills or sore throat, or other symptoms of a cold or flu. Do not treat yourself. Try to avoid being around people who are sick. You may experience fever, chills and shaking during your first infusion. These effects are usually mild and can be treated with other medicines. Report any side effects during the infusion to your health care professional. Fever and chills usually do not happen with later infusions. Do not become pregnant while taking this medicine or for 7 months after stopping it. Women should inform their doctor if they wish to become pregnant or think they might be pregnant. Women of child-bearing potential will need to have a negative pregnancy test before starting this medicine. There is a potential for serious side effects to an unborn child. Talk to your health care professional or pharmacist for more information. Do not breast-feed an infant while taking this medicine or for 7 months after stopping it. Women must use effective birth control with this medicine. What side effects may I notice from receiving this medicine? Side effects that you should report to your doctor or health care professional as soon as possible: -allergic reactions like skin rash, itching or hives, swelling of the face, lips, or tongue -chest pain or palpitations -cough -dizziness -feeling faint or lightheaded, falls -fever -general ill feeling or flu-like symptoms -signs of worsening heart failure like breathing problems; swelling in your legs and feet -unusually weak or tired Side effects that usually do not  require medical attention (report to your doctor or health care professional if they continue or are bothersome): -bone pain -changes in taste -diarrhea -joint pain -nausea/vomiting -weight loss This list may  not describe all possible side effects. Call your doctor for medical advice about side effects. You may report side effects to FDA at 1-800-FDA-1088. Where should I keep my medicine? This drug is given in a hospital or clinic and will not be stored at home. NOTE: This sheet is a summary. It may not cover all possible information. If you have questions about this medicine, talk to your doctor, pharmacist, or health care provider.  2019 Elsevier/Gold Standard (2016-02-27 14:37:52)

## 2018-06-03 LAB — FERRITIN: Ferritin: 78 ng/mL (ref 24–336)

## 2018-06-03 LAB — CEA (IN HOUSE-CHCC): CEA (CHCC-IN HOUSE): 1.79 ng/mL (ref 0.00–5.00)

## 2018-06-03 LAB — IRON AND TIBC
Iron: 117 ug/dL (ref 42–163)
Saturation Ratios: 37 % (ref 20–55)
TIBC: 313 ug/dL (ref 202–409)
UIBC: 196 ug/dL (ref 117–376)

## 2018-06-16 ENCOUNTER — Other Ambulatory Visit: Payer: 59

## 2018-06-16 ENCOUNTER — Ambulatory Visit: Payer: 59

## 2018-06-16 ENCOUNTER — Ambulatory Visit: Payer: 59 | Admitting: Family

## 2018-06-17 ENCOUNTER — Other Ambulatory Visit: Payer: 59

## 2018-06-17 ENCOUNTER — Ambulatory Visit: Payer: 59

## 2018-06-17 ENCOUNTER — Ambulatory Visit: Payer: 59 | Admitting: Family

## 2018-06-22 ENCOUNTER — Encounter: Payer: Self-pay | Admitting: Hematology & Oncology

## 2018-06-22 ENCOUNTER — Inpatient Hospital Stay: Payer: 59 | Attending: Hematology & Oncology

## 2018-06-22 ENCOUNTER — Other Ambulatory Visit: Payer: Self-pay

## 2018-06-22 ENCOUNTER — Other Ambulatory Visit: Payer: Self-pay | Admitting: Oncology

## 2018-06-22 ENCOUNTER — Inpatient Hospital Stay (HOSPITAL_BASED_OUTPATIENT_CLINIC_OR_DEPARTMENT_OTHER): Payer: 59 | Admitting: Hematology & Oncology

## 2018-06-22 ENCOUNTER — Inpatient Hospital Stay: Payer: 59

## 2018-06-22 VITALS — BP 150/77 | HR 69 | Temp 98.6°F | Resp 17 | Wt 195.5 lb

## 2018-06-22 DIAGNOSIS — G629 Polyneuropathy, unspecified: Secondary | ICD-10-CM | POA: Insufficient documentation

## 2018-06-22 DIAGNOSIS — Z79899 Other long term (current) drug therapy: Secondary | ICD-10-CM | POA: Diagnosis not present

## 2018-06-22 DIAGNOSIS — Z5112 Encounter for antineoplastic immunotherapy: Secondary | ICD-10-CM | POA: Insufficient documentation

## 2018-06-22 DIAGNOSIS — C160 Malignant neoplasm of cardia: Secondary | ICD-10-CM

## 2018-06-22 DIAGNOSIS — C799 Secondary malignant neoplasm of unspecified site: Principal | ICD-10-CM

## 2018-06-22 LAB — CMP (CANCER CENTER ONLY)
ALT: 19 U/L (ref 0–44)
AST: 24 U/L (ref 15–41)
Albumin: 4.1 g/dL (ref 3.5–5.0)
Alkaline Phosphatase: 57 U/L (ref 38–126)
Anion gap: 9 (ref 5–15)
BUN: 14 mg/dL (ref 6–20)
CO2: 27 mmol/L (ref 22–32)
Calcium: 8.8 mg/dL — ABNORMAL LOW (ref 8.9–10.3)
Chloride: 104 mmol/L (ref 98–111)
Creatinine: 1.01 mg/dL (ref 0.61–1.24)
GFR, Est AFR Am: >60 mL/min (ref >60)
GFR, Estimated: >60 mL/min (ref >60)
Glucose, Bld: 119 mg/dL — ABNORMAL HIGH (ref 70–99)
Potassium: 3.7 mmol/L (ref 3.5–5.1)
Sodium: 140 mmol/L (ref 135–145)
Total Bilirubin: 0.5 mg/dL (ref 0.3–1.2)
Total Protein: 6.3 g/dL — ABNORMAL LOW (ref 6.5–8.1)

## 2018-06-22 LAB — CBC WITH DIFFERENTIAL (CANCER CENTER ONLY)
Abs Immature Granulocytes: 0.05 10*3/uL (ref 0.00–0.07)
Basophils Absolute: 0 10*3/uL (ref 0.0–0.1)
Basophils Relative: 1 %
Eosinophils Absolute: 0.2 10*3/uL (ref 0.0–0.5)
Eosinophils Relative: 4 %
HCT: 39.9 % (ref 39.0–52.0)
Hemoglobin: 13.8 g/dL (ref 13.0–17.0)
Immature Granulocytes: 1 %
Lymphocytes Relative: 31 %
Lymphs Abs: 1.2 10*3/uL (ref 0.7–4.0)
MCH: 30.6 pg (ref 26.0–34.0)
MCHC: 34.6 g/dL (ref 30.0–36.0)
MCV: 88.5 fL (ref 80.0–100.0)
Monocytes Absolute: 0.5 10*3/uL (ref 0.1–1.0)
Monocytes Relative: 12 %
Neutro Abs: 2 10*3/uL (ref 1.7–7.7)
Neutrophils Relative %: 51 %
Platelet Count: 142 10*3/uL — ABNORMAL LOW (ref 150–400)
RBC: 4.51 MIL/uL (ref 4.22–5.81)
RDW: 15.2 % (ref 11.5–15.5)
WBC Count: 3.8 10*3/uL — ABNORMAL LOW (ref 4.0–10.5)
nRBC: 0 % (ref 0.0–0.2)

## 2018-06-22 LAB — FERRITIN: Ferritin: 92 ng/mL (ref 24–336)

## 2018-06-22 LAB — IRON AND TIBC
Iron: 77 ug/dL (ref 42–163)
Saturation Ratios: 26 % (ref 20–55)
TIBC: 298 ug/dL (ref 202–409)
UIBC: 221 ug/dL (ref 117–376)

## 2018-06-22 MED ORDER — TRASTUZUMAB CHEMO 150 MG IV SOLR
6.0000 mg/kg | Freq: Once | INTRAVENOUS | Status: AC
  Administered 2018-06-22: 12:00:00 525 mg via INTRAVENOUS
  Filled 2018-06-22: qty 25

## 2018-06-22 MED ORDER — SODIUM CHLORIDE 0.9% FLUSH
10.0000 mL | INTRAVENOUS | Status: DC | PRN
  Administered 2018-06-22: 10 mL
  Filled 2018-06-22: qty 10

## 2018-06-22 MED ORDER — DIPHENHYDRAMINE HCL 25 MG PO CAPS
ORAL_CAPSULE | ORAL | Status: AC
  Filled 2018-06-22: qty 2

## 2018-06-22 MED ORDER — SODIUM CHLORIDE 0.9 % IV SOLN
Freq: Once | INTRAVENOUS | Status: AC
  Administered 2018-06-22: 11:00:00 via INTRAVENOUS
  Filled 2018-06-22: qty 250

## 2018-06-22 MED ORDER — ACETAMINOPHEN 325 MG PO TABS
650.0000 mg | ORAL_TABLET | Freq: Once | ORAL | Status: AC
  Administered 2018-06-22: 650 mg via ORAL

## 2018-06-22 MED ORDER — VITAMIN B-6 500 MG PO TABS: 500.0000 mg | ORAL_TABLET | Freq: Every day | ORAL | 6 refills | Status: DC

## 2018-06-22 MED ORDER — ACETAMINOPHEN 325 MG PO TABS
ORAL_TABLET | ORAL | Status: AC
  Filled 2018-06-22: qty 2

## 2018-06-22 MED ORDER — DIPHENHYDRAMINE HCL 25 MG PO CAPS
50.0000 mg | ORAL_CAPSULE | Freq: Once | ORAL | Status: AC
  Administered 2018-06-22: 50 mg via ORAL

## 2018-06-22 MED ORDER — HEPARIN SOD (PORK) LOCK FLUSH 100 UNIT/ML IV SOLN
500.0000 [IU] | Freq: Once | INTRAVENOUS | Status: AC | PRN
  Administered 2018-06-22: 12:00:00 500 [IU]
  Filled 2018-06-22: qty 5

## 2018-06-22 MED FILL — VIT B-6 200 MG TABLET: 200 MG | 24 days supply | Qty: 60 | Fill #0

## 2018-06-22 NOTE — Progress Notes (Signed)
Hematology and Oncology Follow Up Visit  Matthew Reynolds [id number] [birth year] 53 y.o. 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 Laura is here today for follow-up.  Unfortunately, his wife cannot be with him because of the coronavirus restrictions.  He is doing quite well with the Xeloda.  He does have some neuropathy.  He is able to do his activities of daily living.  He has not had any issues with nausea or vomiting.  He has had no mouth sores.  He has had no rashes.  There is been no diarrhea.  He has had no pain.  Overall, [name].      Medications:  Allergies as of 06/22/2018   No Known Allergies     Medication List       Accurate as of June 22, 2018 10:45 AM. Always use your most recent med list.        famotidine 40 MG tablet Commonly known as:  Pepcid Take 1 tablet (40 mg total) by mouth 2 (two) times daily.   lidocaine-prilocaine cream Commonly known as:  EMLA Apply 1 application topically as needed.   polyethylene glycol packet Commonly known as:  MIRALAX / GLYCOLAX Take 17 g by mouth 2 (two) times daily.   vitamin B-6 500 MG tablet Take 1 tablet (500 mg total) by mouth daily.   Xeloda 500 MG tablet Generic drug:  capecitabine Take 5 tablets (2,500 mg total) by mouth 2 (two) times daily after a meal. Take for 14 days, then hold for 7 days. Repeat every 21 days.       Allergies: No Known Allergies  Past Medical History, Surgical history, Social history, and Family History were reviewed and updated.  Review of Systems: Review of Systems  Constitutional: Negative.   HENT: Negative.   Eyes: Negative.   Respiratory: Negative.   Cardiovascular: Negative.   Gastrointestinal: Negative.   Genitourinary:  Negative.   Musculoskeletal: Negative.   Skin: Negative.   Neurological: Positive for tingling.  Endo/Heme/Allergies: Negative.   Psychiatric/Behavioral: Negative.    Marland Kitchen   Physical Exam:  weight is 195 lb 8 oz (88.7 kg). His temperature is 98.6 F (37 C). His blood pressure is 150/77 (abnormal) and his pulse is 69. His respiration is 17 and oxygen saturation is 100%.   Wt Readings from Last 3 Encounters:  06/22/18 195 lb 8 oz (88.7 kg)  06/22/18 195 lb 8 oz (88.7 kg)  06/02/18 190 lb (86.2 kg)    Physical Exam Vitals signs reviewed.  HENT:     Head: Normocephalic and atraumatic.  Eyes:     Pupils: Pupils are equal, round, and reactive to light.  Neck:     Musculoskeletal: Normal range of motion.  Cardiovascular:     Rate and Rhythm: Normal rate and regular rhythm.     Heart sounds: Normal heart sounds.  Pulmonary:     Effort: Pulmonary effort is normal.     Breath sounds: Normal breath sounds.  Abdominal:     General: Bowel sounds are normal.     Palpations: Abdomen is soft.  Musculoskeletal: Normal range of motion.        General: No tenderness or deformity.  Lymphadenopathy:     Cervical:  No cervical adenopathy.  Skin:    General: Skin is warm and dry.     Findings: No erythema or rash.  Neurological:     Mental Status: He is alert and oriented to person, place, and time.  Psychiatric:        Behavior: Behavior normal.        Thought Content: Thought content normal.        Judgment: Judgment normal.      Lab Results  Component Value Date   WBC 3.8 (L) 06/22/2018   HGB 13.8 06/22/2018   HCT 39.9 06/22/2018   MCV 88.5 06/22/2018   PLT 142 (L) 06/22/2018   Lab Results  Component Value Date   FERRITIN 78 06/02/2018   IRON 117 06/02/2018   TIBC 313 06/02/2018   UIBC 196 06/02/2018   IRONPCTSAT 37 06/02/2018   Lab Results  Component Value Date   RBC 4.51 06/22/2018   No results found for: KPAFRELGTCHN, LAMBDASER, KAPLAMBRATIO No results found for:  IGGSERUM, IGA, IGMSERUM No results found for: Odetta Pink, SPEI   Chemistry      Component Value Date/Time   NA 140 06/22/2018 0925   K 3.7 06/22/2018 0925   CL 104 06/22/2018 0925   CO2 27 06/22/2018 0925   BUN 14 06/22/2018 0925   CREATININE 1.01 06/22/2018 0925      Component Value Date/Time   CALCIUM 8.8 (L) 06/22/2018 0925   ALKPHOS 57 06/22/2018 0925   AST 24 06/22/2018 0925   ALT 19 06/22/2018 0925   BILITOT 0.5 06/22/2018 0925       Impression and Plan: Mr. Rehm is a very pleasant 53 yo caucasian gentleman with metastatic adenocarcinoma of the GE junction, HER-2 positive.   We will continue him on the Xeloda/Herceptin protocol.  I think this is very reasonable.  I would almost consider this as maintenance therapy for him.  We will go ahead with his third cycle of treatment.  After 4 cycles, we will then rescan him and see how everything looks.  His quality of life continues to do quite nicely which is wonderful to see.     [name], MD 4/6/202010:45 AM

## 2018-06-30 ENCOUNTER — Ambulatory Visit: Payer: 59

## 2018-06-30 ENCOUNTER — Other Ambulatory Visit: Payer: 59

## 2018-06-30 ENCOUNTER — Ambulatory Visit: Payer: 59 | Admitting: Hematology & Oncology

## 2018-07-14 ENCOUNTER — Inpatient Hospital Stay: Payer: 59

## 2018-07-14 ENCOUNTER — Other Ambulatory Visit: Payer: Self-pay

## 2018-07-14 ENCOUNTER — Inpatient Hospital Stay (HOSPITAL_BASED_OUTPATIENT_CLINIC_OR_DEPARTMENT_OTHER): Payer: 59 | Admitting: Hematology & Oncology

## 2018-07-14 DIAGNOSIS — Z79899 Other long term (current) drug therapy: Secondary | ICD-10-CM

## 2018-07-14 DIAGNOSIS — C799 Secondary malignant neoplasm of unspecified site: Principal | ICD-10-CM

## 2018-07-14 DIAGNOSIS — C160 Malignant neoplasm of cardia: Secondary | ICD-10-CM | POA: Diagnosis not present

## 2018-07-14 DIAGNOSIS — Z5112 Encounter for antineoplastic immunotherapy: Secondary | ICD-10-CM | POA: Diagnosis not present

## 2018-07-14 DIAGNOSIS — G629 Polyneuropathy, unspecified: Secondary | ICD-10-CM | POA: Diagnosis not present

## 2018-07-14 LAB — IRON AND TIBC
Iron: 127 ug/dL (ref 42–163)
Saturation Ratios: 41 % (ref 20–55)
TIBC: 309 ug/dL (ref 202–409)
UIBC: 182 ug/dL (ref 117–376)

## 2018-07-14 LAB — CBC WITH DIFFERENTIAL (CANCER CENTER ONLY)
Abs Immature Granulocytes: 0.06 10*3/uL (ref 0.00–0.07)
Basophils Absolute: 0 10*3/uL (ref 0.0–0.1)
Basophils Relative: 1 %
Eosinophils Absolute: 0.2 10*3/uL (ref 0.0–0.5)
Eosinophils Relative: 4 %
HCT: 39.8 % (ref 39.0–52.0)
Hemoglobin: 14 g/dL (ref 13.0–17.0)
Immature Granulocytes: 1 %
Lymphocytes Relative: 36 %
Lymphs Abs: 1.5 10*3/uL (ref 0.7–4.0)
MCH: 31.7 pg (ref 26.0–34.0)
MCHC: 35.2 g/dL (ref 30.0–36.0)
MCV: 90 fL (ref 80.0–100.0)
Monocytes Absolute: 0.5 10*3/uL (ref 0.1–1.0)
Monocytes Relative: 11 %
Neutro Abs: 2 10*3/uL (ref 1.7–7.7)
Neutrophils Relative %: 47 %
Platelet Count: 144 10*3/uL — ABNORMAL LOW (ref 150–400)
RBC: 4.42 MIL/uL (ref 4.22–5.81)
RDW: 17.5 % — ABNORMAL HIGH (ref 11.5–15.5)
WBC Count: 4.2 10*3/uL (ref 4.0–10.5)
nRBC: 0 % (ref 0.0–0.2)

## 2018-07-14 LAB — CMP (CANCER CENTER ONLY)
ALT: 21 U/L (ref 0–44)
AST: 26 U/L (ref 15–41)
Albumin: 4 g/dL (ref 3.5–5.0)
Alkaline Phosphatase: 60 U/L (ref 38–126)
Anion gap: 7 (ref 5–15)
BUN: 12 mg/dL (ref 6–20)
CO2: 27 mmol/L (ref 22–32)
Calcium: 9.2 mg/dL (ref 8.9–10.3)
Chloride: 105 mmol/L (ref 98–111)
Creatinine: 1.06 mg/dL (ref 0.61–1.24)
GFR, Est AFR Am: >60 mL/min (ref >60)
GFR, Estimated: >60 mL/min (ref >60)
Glucose, Bld: 116 mg/dL — ABNORMAL HIGH (ref 70–99)
Potassium: 3.8 mmol/L (ref 3.5–5.1)
Sodium: 139 mmol/L (ref 135–145)
Total Bilirubin: 0.8 mg/dL (ref 0.3–1.2)
Total Protein: 6.5 g/dL (ref 6.5–8.1)

## 2018-07-14 LAB — FERRITIN: Ferritin: 92 ng/mL (ref 24–336)

## 2018-07-14 MED ORDER — GABAPENTIN 300 MG PO CAPS: ORAL_CAPSULE | ORAL | 4 refills | Status: DC

## 2018-07-14 MED ORDER — TRASTUZUMAB CHEMO 150 MG IV SOLR
6.0000 mg/kg | Freq: Once | INTRAVENOUS | Status: AC
  Administered 2018-07-14: 525 mg via INTRAVENOUS
  Filled 2018-07-14: qty 25

## 2018-07-14 MED ORDER — DIPHENHYDRAMINE HCL 25 MG PO CAPS
50.0000 mg | ORAL_CAPSULE | Freq: Once | ORAL | Status: AC
  Administered 2018-07-14: 50 mg via ORAL

## 2018-07-14 MED ORDER — ACETAMINOPHEN 325 MG PO TABS
ORAL_TABLET | ORAL | Status: AC
  Filled 2018-07-14: qty 2

## 2018-07-14 MED ORDER — ACETAMINOPHEN 325 MG PO TABS
650.0000 mg | ORAL_TABLET | Freq: Once | ORAL | Status: AC
  Administered 2018-07-14: 650 mg via ORAL

## 2018-07-14 MED ORDER — SODIUM CHLORIDE 0.9 % IV SOLN
Freq: Once | INTRAVENOUS | Status: AC
  Administered 2018-07-14: 09:00:00 via INTRAVENOUS
  Filled 2018-07-14: qty 250

## 2018-07-14 MED ORDER — DIPHENHYDRAMINE HCL 25 MG PO CAPS
ORAL_CAPSULE | ORAL | Status: AC
  Filled 2018-07-14: qty 2

## 2018-07-14 MED ORDER — SODIUM CHLORIDE 0.9% FLUSH
10.0000 mL | INTRAVENOUS | Status: DC | PRN
  Administered 2018-07-14: 10 mL
  Filled 2018-07-14: qty 10

## 2018-07-14 MED ORDER — VITAMIN B-6 500 MG PO TABS: 500.0000 mg | ORAL_TABLET | Freq: Every day | ORAL | 6 refills | Status: DC

## 2018-07-14 MED ORDER — HEPARIN SOD (PORK) LOCK FLUSH 100 UNIT/ML IV SOLN
500.0000 [IU] | Freq: Once | INTRAVENOUS | Status: AC | PRN
  Administered 2018-07-14: 10:00:00 500 [IU]
  Filled 2018-07-14: qty 5

## 2018-07-14 MED ORDER — FAMOTIDINE 40 MG PO TABS: 40.0000 mg | ORAL_TABLET | Freq: Two times a day (BID) | ORAL | 6 refills | Status: DC

## 2018-07-14 MED FILL — VIT B-6 200 MG TABLET: 200 MG | 24 days supply | Qty: 60 | Fill #0

## 2018-07-14 MED FILL — GABAPENTIN 300 MG CAPSULE: 300 | 30 days supply | Qty: 120 | Fill #0

## 2018-07-14 NOTE — Patient Instructions (Signed)

## 2018-07-14 NOTE — Patient Instructions (Signed)
Trastuzumab injection for infusion What is this medicine? TRASTUZUMAB (tras TOO zoo mab) is a monoclonal antibody. It is used to treat breast cancer and stomach cancer. This medicine may be used for other purposes; ask your health care provider or pharmacist if you have questions. COMMON BRAND NAME(S): Herceptin, Calla Kicks, OGIVRI What should I tell my health care provider before I take this medicine? They need to know if you have any of these conditions: -heart disease -heart failure -lung or breathing disease, like asthma -an unusual or allergic reaction to trastuzumab, benzyl alcohol, or other medications, foods, dyes, or preservatives -pregnant or trying to get pregnant -breast-feeding How should I use this medicine? This drug is given as an infusion into a vein. It is administered in a hospital or clinic by a specially trained health care professional. Talk to your pediatrician regarding the use of this medicine in children. This medicine is not approved for use in children. Overdosage: If you think you have taken too much of this medicine contact a poison control center or emergency room at once. NOTE: This medicine is only for you. Do not share this medicine with others. What if I miss a dose? It is important not to miss a dose. Call your doctor or health care professional if you are unable to keep an appointment. What may interact with this medicine? This medicine may interact with the following medications: -certain types of chemotherapy, such as daunorubicin, doxorubicin, epirubicin, and idarubicin This list may not describe all possible interactions. Give your health care provider a list of all the medicines, herbs, non-prescription drugs, or dietary supplements you use. Also tell them if you smoke, drink alcohol, or use illegal drugs. Some items may interact with your medicine. What should I watch for while using this medicine? Visit your doctor for checks on your progress. Report  any side effects. Continue your course of treatment even though you feel ill unless your doctor tells you to stop. Call your doctor or health care professional for advice if you get a fever, chills or sore throat, or other symptoms of a cold or flu. Do not treat yourself. Try to avoid being around people who are sick. You may experience fever, chills and shaking during your first infusion. These effects are usually mild and can be treated with other medicines. Report any side effects during the infusion to your health care professional. Fever and chills usually do not happen with later infusions. Do not become pregnant while taking this medicine or for 7 months after stopping it. Women should inform their doctor if they wish to become pregnant or think they might be pregnant. Women of child-bearing potential will need to have a negative pregnancy test before starting this medicine. There is a potential for serious side effects to an unborn child. Talk to your health care professional or pharmacist for more information. Do not breast-feed an infant while taking this medicine or for 7 months after stopping it. Women must use effective birth control with this medicine. What side effects may I notice from receiving this medicine? Side effects that you should report to your doctor or health care professional as soon as possible: -allergic reactions like skin rash, itching or hives, swelling of the face, lips, or tongue -chest pain or palpitations -cough -dizziness -feeling faint or lightheaded, falls -fever -general ill feeling or flu-like symptoms -signs of worsening heart failure like breathing problems; swelling in your legs and feet -unusually weak or tired Side effects that usually do not  require medical attention (report to your doctor or health care professional if they continue or are bothersome): -bone pain -changes in taste -diarrhea -joint pain -nausea/vomiting -weight loss This list may  not describe all possible side effects. Call your doctor for medical advice about side effects. You may report side effects to FDA at 1-800-FDA-1088. Where should I keep my medicine? This drug is given in a hospital or clinic and will not be stored at home. NOTE: This sheet is a summary. It may not cover all possible information. If you have questions about this medicine, talk to your doctor, pharmacist, or health care provider.  2019 Elsevier/Gold Standard (2016-02-27 14:37:52)

## 2018-07-14 NOTE — Progress Notes (Signed)
Hematology and Oncology Follow Up Visit  Matthew Reynolds [id number] Aug 31, 1965 53 y.o. 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  Unfortunately, his wife cannot be with him because of the coronavirus restrictions.  He is doing quite well with the Xeloda.  He does have some neuropathy.  I do him on current B6.  I think we probably should try him on a little bit of Neurontin.  I will start him on 300 mg p.o. twice daily and then 600 mg at bedtime.  He does have little more erythema and redness on the palms of his hand.  The skin appears to be a little bit dry.  He is doing a very good job with try to keep his hands moist with moisturizer.  I think that we will give him an extra week off after this cycle of treatment.  I think this might help a little bit.  He is able to do his activities of daily living.  He has not had any issues with nausea or vomiting.  He has had no mouth sores.  He has had no rashes.  There is been a little bit of diarrhea.   Overall, [name].      Medications:  Allergies as of 07/14/2018   No Known Allergies     Medication List       Accurate as of July 14, 2018  8:24 AM. Always use your most recent med list.        famotidine 40 MG tablet Commonly known as:  Pepcid Take 1 tablet (40 mg total) by mouth 2 (two) times daily.   lidocaine-prilocaine cream Commonly known as:  EMLA Apply 1 application topically as needed.   polyethylene glycol 17 g packet Commonly known as:  MIRALAX / GLYCOLAX Take 17 g by mouth 2 (two) times daily.   vitamin B-6 500 MG tablet Take 1 tablet (500 mg total) by mouth daily.   Xeloda 500 MG tablet Generic drug:  capecitabine Take 5  tablets (2,500 mg total) by mouth 2 (two) times daily after a meal. Take for 14 days, then hold for 7 days. Repeat every 21 days.       Allergies: No Known Allergies  Past Medical History, Surgical history, Social history, and Family History were reviewed and updated.  Review of Systems: Review of Systems  Constitutional: Negative.   HENT: Negative.   Eyes: Negative.   Respiratory: Negative.   Cardiovascular: Negative.   Gastrointestinal: Negative.   Genitourinary: Negative.   Musculoskeletal: Negative.   Skin: Negative.   Neurological: Positive for tingling.  Endo/Heme/Allergies: Negative.   Psychiatric/Behavioral: Negative.    Marland Kitchen   Physical Exam:  vitals were not taken for this visit.   Wt Readings from Last 3 Encounters:  07/14/18 198 lb 0.6 oz (89.8 kg)  06/22/18 195 lb 8 oz (88.7 kg)  06/22/18 195 lb 8 oz (88.7 kg)    Physical Exam Vitals signs reviewed.  HENT:     Head: Normocephalic and atraumatic.  Eyes:     Pupils: Pupils are equal, round, and reactive to light.  Neck:     Musculoskeletal: Normal range of motion.  Cardiovascular:  Rate and Rhythm: Normal rate and regular rhythm.     Heart sounds: Normal heart sounds.  Pulmonary:     Effort: Pulmonary effort is normal.     Breath sounds: Normal breath sounds.  Abdominal:     General: Bowel sounds are normal.     Palpations: Abdomen is soft.  Musculoskeletal: Normal range of motion.        General: No tenderness or deformity.  Lymphadenopathy:     Cervical: No cervical adenopathy.  Skin:    General: Skin is warm and dry.     Findings: No erythema or rash.  Neurological:     Mental Status: He is alert and oriented to person, place, and time.  Psychiatric:        Behavior: Behavior normal.        Thought Content: Thought content normal.        Judgment: Judgment normal.      Lab Results  Component Value Date   WBC 4.2 07/14/2018   HGB 14.0 07/14/2018   HCT 39.8 07/14/2018   MCV 90.0  07/14/2018   PLT 144 (L) 07/14/2018   Lab Results  Component Value Date   FERRITIN 92 06/22/2018   IRON 77 06/22/2018   TIBC 298 06/22/2018   UIBC 221 06/22/2018   IRONPCTSAT 26 06/22/2018   Lab Results  Component Value Date   RBC 4.42 07/14/2018   No results found for: KPAFRELGTCHN, LAMBDASER, KAPLAMBRATIO No results found for: IGGSERUM, IGA, IGMSERUM No results found for: Odetta Pink, SPEI   Chemistry      Component Value Date/Time   NA 140 06/22/2018 0925   K 3.7 06/22/2018 0925   CL 104 06/22/2018 0925   CO2 27 06/22/2018 0925   BUN 14 06/22/2018 0925   CREATININE 1.01 06/22/2018 0925      Component Value Date/Time   CALCIUM 8.8 (L) 06/22/2018 0925   ALKPHOS 57 06/22/2018 0925   AST 24 06/22/2018 0925   ALT 19 06/22/2018 0925   BILITOT 0.5 06/22/2018 0925       [name] is a very pleasant 53 yo caucasian gentleman with metastatic adenocarcinoma of the GE junction, HER-2 positive.   We will continue him on the Xeloda/Herceptin protocol.  I think this is very reasonable.  I would almost consider this as maintenance therapy for him.  I will go ahead and set him up with a PET scan after this cycle of treatment.  I think this is going to be very helpful to see how things are looking.  I would have to believe that he is still doing quite well.  Again, I will give him an extra week off so that he can have a little bit of better healing of his hands.  [name], MD 4/28/20208:24 AM

## 2018-07-20 ENCOUNTER — Encounter: Payer: Self-pay | Admitting: Hematology & Oncology

## 2018-07-27 ENCOUNTER — Encounter: Payer: Self-pay | Admitting: Hematology & Oncology

## 2018-07-30 MED FILL — VIT B-6 200 MG TABLET: 200 MG | 24 days supply | Qty: 60 | Fill #1

## 2018-08-04 ENCOUNTER — Ambulatory Visit: Payer: 59 | Admitting: Family

## 2018-08-04 ENCOUNTER — Ambulatory Visit: Payer: 59

## 2018-08-04 ENCOUNTER — Other Ambulatory Visit: Payer: 59

## 2018-08-05 ENCOUNTER — Ambulatory Visit (HOSPITAL_COMMUNITY)
Admission: RE | Admit: 2018-08-05 | Discharge: 2018-08-05 | Disposition: A | Discharge status: Home / Self Care | Payer: 59 | Source: Ambulatory Visit | Attending: Hematology & Oncology | Admitting: Hematology & Oncology

## 2018-08-05 ENCOUNTER — Other Ambulatory Visit: Payer: Self-pay

## 2018-08-05 DIAGNOSIS — C160 Malignant neoplasm of cardia: Secondary | ICD-10-CM | POA: Diagnosis not present

## 2018-08-05 LAB — GLUCOSE, CAPILLARY: Glucose-Capillary: 87 mg/dL (ref 70–99)

## 2018-08-05 MED ORDER — FLUDEOXYGLUCOSE F - 18 (FDG) INJECTION
9.9000 | Freq: Once | INTRAVENOUS | Status: AC | PRN
  Administered 2018-08-05: 07:00:00 9.9 via INTRAVENOUS

## 2018-08-06 ENCOUNTER — Encounter: Payer: Self-pay | Admitting: Hematology & Oncology

## 2018-08-11 ENCOUNTER — Ambulatory Visit: Payer: 59 | Admitting: Hematology & Oncology

## 2018-08-11 ENCOUNTER — Other Ambulatory Visit: Payer: 59

## 2018-08-11 ENCOUNTER — Ambulatory Visit: Payer: 59

## 2018-08-12 ENCOUNTER — Encounter: Payer: Self-pay | Admitting: Hematology & Oncology

## 2018-08-17 ENCOUNTER — Other Ambulatory Visit: Payer: Self-pay | Admitting: *Deleted

## 2018-08-17 ENCOUNTER — Inpatient Hospital Stay: Payer: 59

## 2018-08-17 ENCOUNTER — Other Ambulatory Visit: Payer: Self-pay

## 2018-08-17 ENCOUNTER — Inpatient Hospital Stay: Payer: 59 | Attending: Hematology & Oncology | Admitting: Hematology & Oncology

## 2018-08-17 VITALS — BP 130/86 | HR 70 | Temp 97.8°F | Resp 18 | Wt 207.0 lb

## 2018-08-17 DIAGNOSIS — C160 Malignant neoplasm of cardia: Secondary | ICD-10-CM

## 2018-08-17 DIAGNOSIS — Z79899 Other long term (current) drug therapy: Secondary | ICD-10-CM | POA: Diagnosis not present

## 2018-08-17 DIAGNOSIS — Z5112 Encounter for antineoplastic immunotherapy: Secondary | ICD-10-CM | POA: Diagnosis present

## 2018-08-17 DIAGNOSIS — C799 Secondary malignant neoplasm of unspecified site: Secondary | ICD-10-CM

## 2018-08-17 LAB — CBC WITH DIFFERENTIAL (CANCER CENTER ONLY)
Abs Immature Granulocytes: 0.03 10*3/uL (ref 0.00–0.07)
Basophils Absolute: 0 10*3/uL (ref 0.0–0.1)
Basophils Relative: 1 %
Eosinophils Absolute: 0.2 10*3/uL (ref 0.0–0.5)
Eosinophils Relative: 5 %
HCT: 41.6 % (ref 39.0–52.0)
Hemoglobin: 14.4 g/dL (ref 13.0–17.0)
Immature Granulocytes: 1 %
Lymphocytes Relative: 35 %
Lymphs Abs: 1.4 10*3/uL (ref 0.7–4.0)
MCH: 33.1 pg (ref 26.0–34.0)
MCHC: 34.6 g/dL (ref 30.0–36.0)
MCV: 95.6 fL (ref 80.0–100.0)
Monocytes Absolute: 0.5 10*3/uL (ref 0.1–1.0)
Monocytes Relative: 13 %
Neutro Abs: 1.8 10*3/uL (ref 1.7–7.7)
Neutrophils Relative %: 45 %
Platelet Count: 125 10*3/uL — ABNORMAL LOW (ref 150–400)
RBC: 4.35 MIL/uL (ref 4.22–5.81)
RDW: 17.1 % — ABNORMAL HIGH (ref 11.5–15.5)
WBC Count: 3.9 10*3/uL — ABNORMAL LOW (ref 4.0–10.5)
nRBC: 0 % (ref 0.0–0.2)

## 2018-08-17 LAB — CMP (CANCER CENTER ONLY)
ALT: 22 U/L (ref 0–44)
AST: 27 U/L (ref 15–41)
Albumin: 4.1 g/dL (ref 3.5–5.0)
Alkaline Phosphatase: 57 U/L (ref 38–126)
Anion gap: 7 (ref 5–15)
BUN: 13 mg/dL (ref 6–20)
CO2: 28 mmol/L (ref 22–32)
Calcium: 8.5 mg/dL — ABNORMAL LOW (ref 8.9–10.3)
Chloride: 106 mmol/L (ref 98–111)
Creatinine: 1.02 mg/dL (ref 0.61–1.24)
GFR, Est AFR Am: >60 mL/min (ref >60)
GFR, Estimated: >60 mL/min (ref >60)
Glucose, Bld: 86 mg/dL (ref 70–99)
Potassium: 4.2 mmol/L (ref 3.5–5.1)
Sodium: 141 mmol/L (ref 135–145)
Total Bilirubin: 0.9 mg/dL (ref 0.3–1.2)
Total Protein: 6.4 g/dL — ABNORMAL LOW (ref 6.5–8.1)

## 2018-08-17 LAB — LACTATE DEHYDROGENASE: LDH: 207 U/L — ABNORMAL HIGH (ref 98–192)

## 2018-08-17 MED ORDER — GABAPENTIN 300 MG PO CAPS: ORAL_CAPSULE | ORAL | 4 refills | Status: DC

## 2018-08-17 MED ORDER — SODIUM CHLORIDE 0.9 % IV SOLN
Freq: Once | INTRAVENOUS | Status: AC
  Administered 2018-08-17: 13:00:00 via INTRAVENOUS
  Filled 2018-08-17: qty 250

## 2018-08-17 MED ORDER — VITAMIN B-6 500 MG PO TABS: 500.0000 mg | ORAL_TABLET | Freq: Every day | ORAL | 6 refills | Status: DC

## 2018-08-17 MED ORDER — FAMOTIDINE 40 MG PO TABS: 40.0000 mg | ORAL_TABLET | Freq: Two times a day (BID) | ORAL | 6 refills | Status: DC

## 2018-08-17 MED ORDER — PYRIDOXINE HCL 200 MG PO TABS: ORAL_TABLET | ORAL | 6 refills | Status: DC

## 2018-08-17 MED ORDER — HEPARIN SOD (PORK) LOCK FLUSH 100 UNIT/ML IV SOLN
500.0000 [IU] | Freq: Once | INTRAVENOUS | Status: AC | PRN
  Administered 2018-08-17: 500 [IU]
  Filled 2018-08-17: qty 5

## 2018-08-17 MED ORDER — ACETAMINOPHEN 325 MG PO TABS
ORAL_TABLET | ORAL | Status: AC
  Filled 2018-08-17: qty 2

## 2018-08-17 MED ORDER — TRASTUZUMAB CHEMO 150 MG IV SOLR
6.0000 mg/kg | Freq: Once | INTRAVENOUS | Status: AC
  Administered 2018-08-17: 525 mg via INTRAVENOUS
  Filled 2018-08-17: qty 25

## 2018-08-17 MED ORDER — ACETAMINOPHEN 325 MG PO TABS
650.0000 mg | ORAL_TABLET | Freq: Once | ORAL | Status: AC
  Administered 2018-08-17: 650 mg via ORAL

## 2018-08-17 MED ORDER — DIPHENHYDRAMINE HCL 25 MG PO CAPS
ORAL_CAPSULE | ORAL | Status: AC
  Filled 2018-08-17: qty 2

## 2018-08-17 MED ORDER — DIPHENHYDRAMINE HCL 25 MG PO CAPS
50.0000 mg | ORAL_CAPSULE | Freq: Once | ORAL | Status: AC
  Administered 2018-08-17: 13:00:00 50 mg via ORAL

## 2018-08-17 MED ORDER — SODIUM CHLORIDE 0.9% FLUSH
10.0000 mL | INTRAVENOUS | Status: DC | PRN
  Administered 2018-08-17: 10 mL
  Filled 2018-08-17: qty 10

## 2018-08-17 MED FILL — VIT B-6 200 MG TABLET: 200 MG | 20 days supply | Qty: 60 | Fill #0

## 2018-08-17 MED FILL — GABAPENTIN 300 MG CAPSULE: 300 | 30 days supply | Qty: 120 | Fill #0

## 2018-08-17 NOTE — Patient Instructions (Signed)

## 2018-08-17 NOTE — Patient Instructions (Addendum)
Acetaminophen tablets or caplets What is this medicine? ACETAMINOPHEN (a set a MEE noe fen) is a pain reliever. It is used to treat mild pain and fever. This medicine may be used for other purposes; ask your health care provider or pharmacist if you have questions. COMMON BRAND NAME(S): Aceta, Actamin, Anacin Aspirin Free, Genapap, Genebs, Mapap, Pain & Fever, Pain and Fever, PAIN RELIEF, PAIN RELIEF Extra Strength, Pain Reliever, Panadol, PHARBETOL, Q-Pap, Q-Pap Extra Strength, Tylenol, Tylenol CrushableTablet, Tylenol Extra Strength, XS No Aspirin, XS Pain Reliever What should I tell my health care provider before I take this medicine? They need to know if you have any of these conditions: -if you often drink alcohol -liver disease -an unusual or allergic reaction to acetaminophen, other medicines, foods, dyes, or preservatives -pregnant or trying to get pregnant -breast-feeding How should I use this medicine? Take this medicine by mouth with a glass of water. Follow the directions on the package or prescription label. Take your medicine at regular intervals. Do not take your medicine more often than directed. Talk to your pediatrician regarding the use of this medicine in children. While this drug may be prescribed for children as young as 44 years of age for selected conditions, precautions do apply. Overdosage: If you think you have taken too much of this medicine contact a poison control center or emergency room at once. NOTE: This medicine is only for you. Do not share this medicine with others. What if I miss a dose? If you miss a dose, take it as soon as you can. If it is almost time for your next dose, take only that dose. Do not take double or extra doses. What may interact with this medicine? -alcohol -imatinib -isoniazid -other medicines with acetaminophen This list may not describe all possible interactions. Give your health care provider a list of all the medicines, herbs,  non-prescription drugs, or dietary supplements you use. Also tell them if you smoke, drink alcohol, or use illegal drugs. Some items may interact with your medicine. What should I watch for while using this medicine? Tell your doctor or health care professional if the pain lasts more than 10 days (5 days for children), if it gets worse, or if there is a new or different kind of pain. Also, check with your doctor if a fever lasts for more than 3 days. Do not take other medicines that contain acetaminophen with this medicine. Always read labels carefully. If you have questions, ask your doctor or pharmacist. If you take too much acetaminophen get medical help right away. Too much acetaminophen can be very dangerous and cause liver damage. Even if you do not have symptoms, it is important to get help right away. What side effects may I notice from receiving this medicine? Side effects that you should report to your doctor or health care professional as soon as possible: -allergic reactions like skin rash, itching or hives, swelling of the face, lips, or tongue -breathing problems -fever or sore throat -redness, blistering, peeling or loosening of the skin, including inside the mouth -trouble passing urine or change in the amount of urine -unusual bleeding or bruising -unusually weak or tired -yellowing of the eyes or skin Side effects that usually do not require medical attention (report to your doctor or health care professional if they continue or are bothersome): -headache -nausea, stomach upset This list may not describe all possible side effects. Call your doctor for medical advice about side effects. You may report side effects  to FDA at 1-800-FDA-1088. Where should I keep my medicine? Keep out of reach of children. Store at room temperature between 20 and 25 degrees C (68 and 77 degrees F). Protect from moisture and heat. Throw away any unused medicine after the expiration date. NOTE: This  sheet is a summary. It may not cover all possible information. If you have questions about this medicine, talk to your doctor, pharmacist, or health care provider.  2019 Elsevier/Gold Standard (2012-10-26 12:54:16) Trastuzumab injection for infusion What is this medicine? TRASTUZUMAB (tras TOO zoo mab) is a monoclonal antibody. It is used to treat breast cancer and stomach cancer. This medicine may be used for other purposes; ask your health care provider or pharmacist if you have questions. COMMON BRAND NAME(S): Herceptin, Calla Kicks, OGIVRI What should I tell my health care provider before I take this medicine? They need to know if you have any of these conditions: -heart disease -heart failure -lung or breathing disease, like asthma -an unusual or allergic reaction to trastuzumab, benzyl alcohol, or other medications, foods, dyes, or preservatives -pregnant or trying to get pregnant -breast-feeding How should I use this medicine? This drug is given as an infusion into a vein. It is administered in a hospital or clinic by a specially trained health care professional. Talk to your pediatrician regarding the use of this medicine in children. This medicine is not approved for use in children. Overdosage: If you think you have taken too much of this medicine contact a poison control center or emergency room at once. NOTE: This medicine is only for you. Do not share this medicine with others. What if I miss a dose? It is important not to miss a dose. Call your doctor or health care professional if you are unable to keep an appointment. What may interact with this medicine? This medicine may interact with the following medications: -certain types of chemotherapy, such as daunorubicin, doxorubicin, epirubicin, and idarubicin This list may not describe all possible interactions. Give your health care provider a list of all the medicines, herbs, non-prescription drugs, or dietary supplements you  use. Also tell them if you smoke, drink alcohol, or use illegal drugs. Some items may interact with your medicine. What should I watch for while using this medicine? Visit your doctor for checks on your progress. Report any side effects. Continue your course of treatment even though you feel ill unless your doctor tells you to stop. Call your doctor or health care professional for advice if you get a fever, chills or sore throat, or other symptoms of a cold or flu. Do not treat yourself. Try to avoid being around people who are sick. You may experience fever, chills and shaking during your first infusion. These effects are usually mild and can be treated with other medicines. Report any side effects during the infusion to your health care professional. Fever and chills usually do not happen with later infusions. Do not become pregnant while taking this medicine or for 7 months after stopping it. Women should inform their doctor if they wish to become pregnant or think they might be pregnant. Women of child-bearing potential will need to have a negative pregnancy test before starting this medicine. There is a potential for serious side effects to an unborn child. Talk to your health care professional or pharmacist for more information. Do not breast-feed an infant while taking this medicine or for 7 months after stopping it. Women must use effective birth control with this medicine. What side effects  may I notice from receiving this medicine? Side effects that you should report to your doctor or health care professional as soon as possible: -allergic reactions like skin rash, itching or hives, swelling of the face, lips, or tongue -chest pain or palpitations -cough -dizziness -feeling faint or lightheaded, falls -fever -general ill feeling or flu-like symptoms -signs of worsening heart failure like breathing problems; swelling in your legs and feet -unusually weak or tired Side effects that usually  do not require medical attention (report to your doctor or health care professional if they continue or are bothersome): -bone pain -changes in taste -diarrhea -joint pain -nausea/vomiting -weight loss This list may not describe all possible side effects. Call your doctor for medical advice about side effects. You may report side effects to FDA at 1-800-FDA-1088. Where should I keep my medicine? This drug is given in a hospital or clinic and will not be stored at home. NOTE: This sheet is a summary. It may not cover all possible information. If you have questions about this medicine, talk to your doctor, pharmacist, or health care provider.  2019 Elsevier/Gold Standard (2016-02-27 14:37:52) Diphenhydramine capsules or tablets What is this medicine? DIPHENHYDRAMINE (dye fen HYE dra meen) is an antihistamine. It is used to treat the symptoms of an allergic reaction. It is also used to treat Parkinson's disease. This medicine is also used to prevent and to treat motion sickness and as a nighttime sleep aid. This medicine may be used for other purposes; ask your health care provider or pharmacist if you have questions. COMMON BRAND NAME(S): Alka-Seltzer Plus Allergy, Aller-G-Time, Banophen, Benadryl Allergy, Benadryl Allergy Dye Free, Benadryl Allergy Kapgel, Benadryl Allergy Ultratab, Diphedryl, Diphenhist, Genahist, Geri-Dryl, PHARBEDRYL, Q-Dryl, Gretta Began, Valu-Dryl, Vicks ZzzQuil Nightime Sleep-Aid What should I tell my health care provider before I take this medicine? They need to know if you have any of these conditions: -asthma or lung disease -glaucoma -high blood pressure or heart disease -liver disease -pain or difficulty passing urine -prostate trouble -ulcers or other stomach problems -an unusual or allergic reaction to diphenhydramine, other medicines foods, dyes, or preservatives such as sulfites -pregnant or trying to get pregnant -breast-feeding How should I use this  medicine? Take this medicine by mouth with a full glass of water. Follow the directions on the prescription label. Take your doses at regular intervals. Do not take your medicine more often than directed. To prevent motion sickness start taking this medicine 30 to 60 minutes before you leave. Talk to your pediatrician regarding the use of this medicine in children. Special care may be needed. Patients over 61 years old may have a stronger reaction and need a smaller dose. Overdosage: If you think you have taken too much of this medicine contact a poison control center or emergency room at once. NOTE: This medicine is only for you. Do not share this medicine with others. What if I miss a dose? If you miss a dose, take it as soon as you can. If it is almost time for your next dose, take only that dose. Do not take double or extra doses. What may interact with this medicine? Do not take this medicine with any of the following medications: -MAOIs like Carbex, Eldepryl, Marplan, Nardil, and Parnate This medicine may also interact with the following medications: -alcohol -barbiturates, like phenobarbital -medicines for bladder spasm like oxybutynin, tolterodine -medicines for blood pressure -medicines for depression, anxiety, or psychotic disturbances -medicines for movement abnormalities or Parkinson's disease -medicines for sleep -other medicines  for cold, cough or allergy -some medicines for the stomach like chlordiazepoxide, dicyclomine This list may not describe all possible interactions. Give your health care provider a list of all the medicines, herbs, non-prescription drugs, or dietary supplements you use. Also tell them if you smoke, drink alcohol, or use illegal drugs. Some items may interact with your medicine. What should I watch for while using this medicine? Visit your doctor or health care professional for regular check ups. Tell your doctor if your symptoms do not improve or if they  get worse. Your mouth may get dry. Chewing sugarless gum or sucking hard candy, and drinking plenty of water may help. Contact your doctor if the problem does not go away or is severe. This medicine may cause dry eyes and blurred vision. If you wear contact lenses you may feel some discomfort. Lubricating drops may help. See your eye doctor if the problem does not go away or is severe. You may get drowsy or dizzy. Do not drive, use machinery, or do anything that needs mental alertness until you know how this medicine affects you. Do not stand or sit up quickly, especially if you are an older patient. This reduces the risk of dizzy or fainting spells. Alcohol may interfere with the effect of this medicine. Avoid alcoholic drinks. What side effects may I notice from receiving this medicine? Side effects that you should report to your doctor or health care professional as soon as possible: -allergic reactions like skin rash, itching or hives, swelling of the face, lips, or tongue -changes in vision -confused, agitated, nervous -irregular or fast heartbeat -tremor -trouble passing urine -unusual bleeding or bruising -unusually weak or tired Side effects that usually do not require medical attention (report to your doctor or health care professional if they continue or are bothersome): -constipation, diarrhea -drowsy -headache -loss of appetite -stomach upset, vomiting -thick mucous This list may not describe all possible side effects. Call your doctor for medical advice about side effects. You may report side effects to FDA at 1-800-FDA-1088. Where should I keep my medicine? Keep out of the reach of children. Store at room temperature between 15 and 30 degrees C (59 and 86 degrees F). Keep container closed tightly. Throw away any unused medicine after the expiration date. NOTE: This sheet is a summary. It may not cover all possible information. If you have questions about this medicine, talk to  your doctor, pharmacist, or health care provider.  2019 Elsevier/Gold Standard (2007-06-22 17:06:22)

## 2018-08-17 NOTE — Progress Notes (Signed)
Hematology and Oncology Follow Up Visit  Matthew Reynolds [id number] Sep 23, 1965 53 y.o. 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  Matthew Reynolds really looks fantastic.  I am so jealous of Matthew Reynolds because Matthew Reynolds and his family went to the [employer] over [place] day weekend.  They had a wonderful time there.  Matthew Reynolds is rewarmed a lot of sunscreens.  His malignancy and so far has not recurred.  We did go ahead and do a PET scan on Matthew Reynolds.  This was done a week ago.  PET scan showed no evidence of active metastatic disease.  The primary at the [facility] was not noted to be active.  Matthew Reynolds has had no swallowing difficulties.  Matthew Reynolds is having some problems with his hands from the Xeloda.  His hands are little bit red the skin is cracking.  Matthew Reynolds is trying hard to keep the skin moist.  I told her to try some udder cream.  This might help.  Matthew Reynolds has had no problems with bleeding.  Matthew Reynolds has had no mouth sores.  No leg swelling.  Overall, [name].      Medications:  Allergies as of 08/17/2018   No Known Allergies     Medication List       Accurate as of August 17, 2018 12:10 PM. If you have any questions, ask your nurse or doctor.        famotidine 40 MG tablet Commonly known as:  Pepcid Take 1 tablet (40 mg total) by mouth 2 (two) times daily.   gabapentin 300 MG capsule Commonly known as:  NEURONTIN Take 1 capsule in the morning and 1 in the afternoon.  Take 2 at bedtime   lidocaine-prilocaine cream Commonly known as:  EMLA Apply 1 application topically as needed.   polyethylene glycol 17 g packet Commonly known as:  MIRALAX / GLYCOLAX Take 17 g by mouth 2 (two) times daily.   pyridoxine 500 MG tablet Commonly known as:  B-6  Take 500 mg by mouth 2 (two) times a day. Take two tablets in the morning and one tablet at might. What changed:  Another medication with the same name was removed. Continue taking this medication, and follow the directions you see here. Changed by:  Charlsie Merles, RN   Xeloda 500 MG tablet Generic drug:  capecitabine Take 5 tablets (2,500 mg total) by mouth 2 (two) times daily after a meal. Take for 14 days, then hold for 7 days. Repeat every 21 days.       Allergies: No Known Allergies  Past Medical History, Surgical history, Social history, and Family History were reviewed and updated.  Review of Systems: Review of Systems  Constitutional: Negative.   HENT: Negative.   Eyes: Negative.   Respiratory: Negative.   Cardiovascular: Negative.   Gastrointestinal: Negative.   Genitourinary: Negative.   Musculoskeletal: Negative.   Skin: Negative.   Neurological: Positive for tingling.  Endo/Heme/Allergies: Negative.   Psychiatric/Behavioral: Negative.    Marland Kitchen   Physical Exam:  weight is 207 lb (93.9 kg). His oral temperature is 97.8 F (36.6 C). His blood pressure is 130/86 and his pulse is 70. His respiration is 18 and oxygen saturation  is 99%.   Wt Readings from Last 3 Encounters:  08/17/18 207 lb (93.9 kg)  07/14/18 198 lb 0.6 oz (89.8 kg)  06/22/18 195 lb 8 oz (88.7 kg)    Physical Exam Vitals signs reviewed.  HENT:     Head: Normocephalic and atraumatic.  Eyes:     Pupils: Pupils are equal, round, and reactive to light.  Neck:     Musculoskeletal: Normal range of motion.  Cardiovascular:     Rate and Rhythm: Normal rate and regular rhythm.     Heart sounds: Normal heart sounds.  Pulmonary:     Effort: Pulmonary effort is normal.     Breath sounds: Normal breath sounds.  Abdominal:     General: Bowel sounds are normal.     Palpations: Abdomen is soft.  Musculoskeletal: Normal range of motion.        General: No tenderness or deformity.  Lymphadenopathy:      Cervical: No cervical adenopathy.  Skin:    General: Skin is warm and dry.     Findings: No erythema or rash.  Neurological:     Mental Status: Matthew Reynolds is alert and oriented to person, place, and time.  Psychiatric:        Behavior: Behavior normal.        Thought Content: Thought content normal.        Judgment: Judgment normal.      Lab Results  Component Value Date   WBC 3.9 (L) 08/17/2018   HGB 14.4 08/17/2018   HCT 41.6 08/17/2018   MCV 95.6 08/17/2018   PLT 125 (L) 08/17/2018   Lab Results  Component Value Date   FERRITIN 92 07/14/2018   IRON 127 07/14/2018   TIBC 309 07/14/2018   UIBC 182 07/14/2018   IRONPCTSAT 41 07/14/2018   Lab Results  Component Value Date   RBC 4.35 08/17/2018   No results found for: KPAFRELGTCHN, LAMBDASER, KAPLAMBRATIO No results found for: IGGSERUM, IGA, IGMSERUM No results found for: Odetta Pink, SPEI   Chemistry      Component Value Date/Time   NA 141 08/17/2018 1055   K 4.2 08/17/2018 1055   CL 106 08/17/2018 1055   CO2 28 08/17/2018 1055   BUN 13 08/17/2018 1055   CREATININE 1.02 08/17/2018 1055      Component Value Date/Time   CALCIUM 8.5 (L) 08/17/2018 1055   ALKPHOS 57 08/17/2018 1055   AST 27 08/17/2018 1055   ALT 22 08/17/2018 1055   BILITOT 0.9 08/17/2018 1055       [name] is a very pleasant 53 yo caucasian gentleman with metastatic adenocarcinoma of the GE junction, HER-2 positive.   We will continue Matthew Reynolds on the Xeloda/Herceptin protocol.  At this point, I am going to make his treatments every 28 days.  Matthew Reynolds will be on Xeloda 14 days and off 14 days.  I think the extra time off will help with his hands.  Will subsequently increase his dose of Xeloda to compensate for the change in protocol.  I will plan to see Matthew Reynolds back in another 4 weeks.  Matthew Reynolds has family are planning to go back to the [employer] in August.  I am sure Matthew Reynolds will have a great  time.   [name], MD 6/1/202012:10 PM

## 2018-09-01 ENCOUNTER — Ambulatory Visit: Payer: 59

## 2018-09-01 ENCOUNTER — Other Ambulatory Visit: Payer: 59

## 2018-09-01 ENCOUNTER — Ambulatory Visit: Payer: 59 | Admitting: Hematology & Oncology

## 2018-09-08 ENCOUNTER — Ambulatory Visit: Payer: 59 | Admitting: Hematology & Oncology

## 2018-09-08 ENCOUNTER — Other Ambulatory Visit: Payer: 59

## 2018-09-08 ENCOUNTER — Ambulatory Visit: Payer: 59

## 2018-09-14 ENCOUNTER — Encounter: Payer: Self-pay | Admitting: Hematology & Oncology

## 2018-09-14 ENCOUNTER — Inpatient Hospital Stay: Payer: 59

## 2018-09-14 ENCOUNTER — Telehealth: Payer: Self-pay | Admitting: Hematology & Oncology

## 2018-09-14 ENCOUNTER — Other Ambulatory Visit: Payer: Self-pay

## 2018-09-14 ENCOUNTER — Other Ambulatory Visit: Payer: Self-pay | Admitting: *Deleted

## 2018-09-14 ENCOUNTER — Inpatient Hospital Stay (HOSPITAL_BASED_OUTPATIENT_CLINIC_OR_DEPARTMENT_OTHER): Payer: 59 | Admitting: Hematology & Oncology

## 2018-09-14 VITALS — Wt 209.0 lb

## 2018-09-14 DIAGNOSIS — C160 Malignant neoplasm of cardia: Secondary | ICD-10-CM

## 2018-09-14 DIAGNOSIS — Z79899 Other long term (current) drug therapy: Secondary | ICD-10-CM

## 2018-09-14 DIAGNOSIS — Z5112 Encounter for antineoplastic immunotherapy: Secondary | ICD-10-CM | POA: Diagnosis not present

## 2018-09-14 LAB — CBC WITH DIFFERENTIAL (CANCER CENTER ONLY)
Abs Immature Granulocytes: 0.01 10*3/uL (ref 0.00–0.07)
Basophils Absolute: 0 10*3/uL (ref 0.0–0.1)
Basophils Relative: 1 %
Eosinophils Absolute: 0.2 10*3/uL (ref 0.0–0.5)
Eosinophils Relative: 5 %
HCT: 40.5 % (ref 39.0–52.0)
Hemoglobin: 14.2 g/dL (ref 13.0–17.0)
Immature Granulocytes: 0 %
Lymphocytes Relative: 33 %
Lymphs Abs: 1.2 10*3/uL (ref 0.7–4.0)
MCH: 33.8 pg (ref 26.0–34.0)
MCHC: 35.1 g/dL (ref 30.0–36.0)
MCV: 96.4 fL (ref 80.0–100.0)
Monocytes Absolute: 0.4 10*3/uL (ref 0.1–1.0)
Monocytes Relative: 12 %
Neutro Abs: 1.7 10*3/uL (ref 1.7–7.7)
Neutrophils Relative %: 49 %
Platelet Count: 123 10*3/uL — ABNORMAL LOW (ref 150–400)
RBC: 4.2 MIL/uL — ABNORMAL LOW (ref 4.22–5.81)
RDW: 15.4 % (ref 11.5–15.5)
WBC Count: 3.5 10*3/uL — ABNORMAL LOW (ref 4.0–10.5)
nRBC: 0 % (ref 0.0–0.2)

## 2018-09-14 LAB — CMP (CANCER CENTER ONLY)
ALT: 22 U/L (ref 0–44)
AST: 26 U/L (ref 15–41)
Albumin: 4 g/dL (ref 3.5–5.0)
Alkaline Phosphatase: 51 U/L (ref 38–126)
Anion gap: 6 (ref 5–15)
BUN: 14 mg/dL (ref 6–20)
CO2: 27 mmol/L (ref 22–32)
Calcium: 9 mg/dL (ref 8.9–10.3)
Chloride: 105 mmol/L (ref 98–111)
Creatinine: 1.04 mg/dL (ref 0.61–1.24)
GFR, Est AFR Am: >60 mL/min (ref >60)
GFR, Estimated: >60 mL/min (ref >60)
Glucose, Bld: 112 mg/dL — ABNORMAL HIGH (ref 70–99)
Potassium: 4.1 mmol/L (ref 3.5–5.1)
Sodium: 138 mmol/L (ref 135–145)
Total Bilirubin: 0.8 mg/dL (ref 0.3–1.2)
Total Protein: 6.2 g/dL — ABNORMAL LOW (ref 6.5–8.1)

## 2018-09-14 MED ORDER — ACETAMINOPHEN 325 MG PO TABS
ORAL_TABLET | ORAL | Status: AC
  Filled 2018-09-14: qty 2

## 2018-09-14 MED ORDER — SODIUM CHLORIDE 0.9 % IV SOLN
Freq: Once | INTRAVENOUS | Status: AC
  Administered 2018-09-14: 13:00:00 via INTRAVENOUS
  Filled 2018-09-14: qty 250

## 2018-09-14 MED ORDER — TRASTUZUMAB CHEMO 150 MG IV SOLR
750.0000 mg | Freq: Once | INTRAVENOUS | Status: AC
  Administered 2018-09-14: 750 mg via INTRAVENOUS
  Filled 2018-09-14: qty 35.72

## 2018-09-14 MED ORDER — DIPHENHYDRAMINE HCL 25 MG PO CAPS
ORAL_CAPSULE | ORAL | Status: AC
  Filled 2018-09-14: qty 2

## 2018-09-14 MED ORDER — ACETAMINOPHEN 325 MG PO TABS
650.0000 mg | ORAL_TABLET | Freq: Once | ORAL | Status: AC
  Administered 2018-09-14: 650 mg via ORAL

## 2018-09-14 MED ORDER — DIPHENHYDRAMINE HCL 25 MG PO CAPS
50.0000 mg | ORAL_CAPSULE | Freq: Once | ORAL | Status: AC
  Administered 2018-09-14: 50 mg via ORAL

## 2018-09-14 MED ORDER — PYRIDOXINE HCL 200 MG PO TABS: ORAL_TABLET | ORAL | 6 refills | Status: DC

## 2018-09-14 MED ORDER — SODIUM CHLORIDE 0.9% FLUSH
10.0000 mL | INTRAVENOUS | Status: DC | PRN
  Administered 2018-09-14: 10 mL
  Filled 2018-09-14: qty 10

## 2018-09-14 MED ORDER — GABAPENTIN 300 MG PO CAPS: ORAL_CAPSULE | ORAL | 4 refills | Status: DC

## 2018-09-14 MED ORDER — HEPARIN SOD (PORK) LOCK FLUSH 100 UNIT/ML IV SOLN
500.0000 [IU] | Freq: Once | INTRAVENOUS | Status: AC | PRN
  Administered 2018-09-14: 500 [IU]
  Filled 2018-09-14: qty 5

## 2018-09-14 MED FILL — GABAPENTIN 300 MG CAPSULE: 300 | 30 days supply | Qty: 120 | Fill #0

## 2018-09-14 MED FILL — VIT B-6 200 MG TABLET: 200 MG | 20 days supply | Qty: 60 | Fill #0

## 2018-09-14 NOTE — Telephone Encounter (Signed)
Appointments scheduled patient aware per 6/29 los

## 2018-09-14 NOTE — Progress Notes (Signed)
Ok to treat today with Echo from 05/25/18 and MD Ennever will order a new one to be done prior to next Herceptin.

## 2018-09-14 NOTE — Progress Notes (Signed)
Hematology and Oncology Follow Up Visit  Matthew Reynolds [id number] [birth year] 53 y.o. 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  He really looks fantastic.  As always, he would never know that he had a problem.  He looks fantastic.  He and his wife are walking.  I think he is doing some work.  He has done well with the Xeloda.  I think it really helps that he he has a extra week off.  This helps his blood counts and helps with any kind of side effects that he might have.  He has had no problems with dysphasia or odynophagia.  He has had no abdominal pain.  There is been no melena.  He has had no obvious diarrhea.  Sometimes, he says that the stools are a little loose.  He has had no headache.  He did go to his dentist.  She found that there was a 3-minute spot on the soft palate.  I took a look at this.  This is probably 3 x 6 mm.  Is on the soft palate just left of the midline.  I suspect this probably is from his Xeloda.  His hands and feet look quite good.  There is no erythema or cracking.  I see no evidence of PPE.    Overall, [name].      Medications:  Allergies as of 09/14/2018   No Known Allergies     Medication List       Accurate as of September 14, 2018 12:52 PM. If you have any questions, ask your nurse or doctor.        famotidine 40 MG tablet Commonly known as: Pepcid Take 1 tablet (40 mg total) by mouth 2 (two) times daily.   gabapentin 300 MG capsule Commonly known as: NEURONTIN Take 1 capsule in the morning and 1 in the afternoon.  Take 2 at bedtime   lidocaine-prilocaine cream Commonly known as: EMLA Apply 1 application topically as needed.   polyethylene glycol 17 g  packet Commonly known as: MIRALAX / GLYCOLAX Take 17 g by mouth 2 (two) times daily.   pyridoxine 200 MG tablet Commonly known as: B-6 Take two tablets in the morning and one tablet at night.   Xeloda 500 MG tablet Generic drug: capecitabine Take 5 tablets (2,500 mg total) by mouth 2 (two) times daily after a meal. Take for 14 days, then hold for 7 days. Repeat every 21 days.       Allergies: No Known Allergies  Past Medical History, Surgical history, Social history, and Family History were reviewed and updated.  Review of Systems: Review of Systems  Constitutional: Negative.   HENT: Negative.   Eyes: Negative.   Respiratory: Negative.   Cardiovascular: Negative.   Gastrointestinal: Negative.   Genitourinary: Negative.   Musculoskeletal: Negative.   Skin: Negative.   Neurological: Positive for tingling.  Endo/Heme/Allergies: Negative.   Psychiatric/Behavioral: Negative.    Marland Kitchen   Physical Exam:  weight is 209 lb (94.8 kg).   Wt Readings from Last 3 Encounters:  09/14/18 209 lb (94.8 kg)  08/17/18 207 lb (93.9 kg)  07/14/18 198 lb 0.6 oz (89.8 kg)  Physical Exam Vitals signs reviewed.  HENT:     Head: Normocephalic and atraumatic.  Eyes:     Pupils: Pupils are equal, round, and reactive to light.  Neck:     Musculoskeletal: Normal range of motion.  Cardiovascular:     Rate and Rhythm: Normal rate and regular rhythm.     Heart sounds: Normal heart sounds.  Pulmonary:     Effort: Pulmonary effort is normal.     Breath sounds: Normal breath sounds.  Abdominal:     General: Bowel sounds are normal.     Palpations: Abdomen is soft.  Musculoskeletal: Normal range of motion.        General: No tenderness or deformity.  Lymphadenopathy:     Cervical: No cervical adenopathy.  Skin:    General: Skin is warm and dry.     Findings: No erythema or rash.  Neurological:     Mental Status: He is alert and oriented to person, place, and time.  Psychiatric:         Behavior: Behavior normal.        Thought Content: Thought content normal.        Judgment: Judgment normal.      Lab Results  Component Value Date   WBC 3.5 (L) 09/14/2018   HGB 14.2 09/14/2018   HCT 40.5 09/14/2018   MCV 96.4 09/14/2018   PLT 123 (L) 09/14/2018   Lab Results  Component Value Date   FERRITIN 92 07/14/2018   IRON 127 07/14/2018   TIBC 309 07/14/2018   UIBC 182 07/14/2018   IRONPCTSAT 41 07/14/2018   Lab Results  Component Value Date   RBC 4.20 (L) 09/14/2018   No results found for: KPAFRELGTCHN, LAMBDASER, KAPLAMBRATIO No results found for: IGGSERUM, IGA, IGMSERUM No results found for: Kathrynn Ducking, MSPIKE, SPEI   Chemistry      Component Value Date/Time   NA 138 09/14/2018 1100   K 4.1 09/14/2018 1100   CL 105 09/14/2018 1100   CO2 27 09/14/2018 1100   BUN 14 09/14/2018 1100   CREATININE 1.04 09/14/2018 1100      Component Value Date/Time   CALCIUM 9.0 09/14/2018 1100   ALKPHOS 51 09/14/2018 1100   AST 26 09/14/2018 1100   ALT 22 09/14/2018 1100   BILITOT 0.8 09/14/2018 1100       [name] is a very pleasant 53 yo caucasian gentleman with metastatic adenocarcinoma of the GE junction, HER-2 positive.   We will continue him on the Xeloda/Herceptin protocol.   I will plan to see him back in another 4 weeks.  I know that he will have a wonderful July 4 holiday with his family and friends.  He will make sure that he is very aggressive with using sunscreen.  [name], MD 6/29/202012:52 PM

## 2018-09-14 NOTE — Patient Instructions (Signed)
[facility] Discharge Instructions for Patients Receiving Chemotherapy  Today you received the following chemotherapy agents: Herceptin   To help prevent nausea and vomiting after your treatment, we encourage you to take your nausea medication as directed.    If you develop nausea and vomiting that is not controlled by your nausea medication, call the clinic.   BELOW ARE SYMPTOMS THAT SHOULD BE REPORTED IMMEDIATELY:  *FEVER GREATER THAN 100.5 F  *CHILLS WITH OR WITHOUT FEVER  NAUSEA AND VOMITING THAT IS NOT CONTROLLED WITH YOUR NAUSEA MEDICATION  *UNUSUAL SHORTNESS OF BREATH  *UNUSUAL BRUISING OR BLEEDING  TENDERNESS IN MOUTH AND THROAT WITH OR WITHOUT PRESENCE OF ULCERS  *URINARY PROBLEMS  *BOWEL PROBLEMS  UNUSUAL RASH Items with * indicate a potential emergency and should be followed up as soon as possible.  Feel free to call the clinic you have any questions or concerns. The clinic phone number is [phone].  Please show the CHEMO ALERT CARD at check-in to the Emergency Department and triage nurse.   

## 2018-09-15 ENCOUNTER — Other Ambulatory Visit: Payer: Self-pay | Admitting: Family

## 2018-09-22 ENCOUNTER — Other Ambulatory Visit: Payer: 59

## 2018-09-22 ENCOUNTER — Ambulatory Visit: Payer: 59

## 2018-09-22 ENCOUNTER — Ambulatory Visit: Payer: 59 | Admitting: Hematology & Oncology

## 2018-09-29 ENCOUNTER — Other Ambulatory Visit: Payer: 59

## 2018-09-29 ENCOUNTER — Ambulatory Visit: Payer: 59

## 2018-09-29 ENCOUNTER — Ambulatory Visit: Payer: 59 | Admitting: Hematology & Oncology

## 2018-10-05 ENCOUNTER — Ambulatory Visit (HOSPITAL_COMMUNITY): Payer: 59 | Attending: Internal Medicine

## 2018-10-05 ENCOUNTER — Other Ambulatory Visit: Payer: Self-pay

## 2018-10-05 DIAGNOSIS — C160 Malignant neoplasm of cardia: Secondary | ICD-10-CM | POA: Diagnosis present

## 2018-10-06 ENCOUNTER — Encounter: Payer: Self-pay | Admitting: *Deleted

## 2018-10-06 MED FILL — VIT B-6 200 MG TABLET: 200 MG | 20 days supply | Qty: 60 | Fill #1

## 2018-10-12 ENCOUNTER — Encounter: Payer: Self-pay | Admitting: Hematology & Oncology

## 2018-10-12 ENCOUNTER — Inpatient Hospital Stay: Payer: 59

## 2018-10-12 ENCOUNTER — Other Ambulatory Visit: Payer: Self-pay | Admitting: *Deleted

## 2018-10-12 ENCOUNTER — Inpatient Hospital Stay: Payer: 59 | Attending: Hematology & Oncology | Admitting: Hematology & Oncology

## 2018-10-12 ENCOUNTER — Other Ambulatory Visit: Payer: Self-pay

## 2018-10-12 VITALS — BP 141/81 | HR 71 | Temp 97.7°F | Resp 20 | Wt 205.1 lb

## 2018-10-12 DIAGNOSIS — Z79899 Other long term (current) drug therapy: Secondary | ICD-10-CM

## 2018-10-12 DIAGNOSIS — Z5112 Encounter for antineoplastic immunotherapy: Secondary | ICD-10-CM | POA: Diagnosis not present

## 2018-10-12 DIAGNOSIS — C160 Malignant neoplasm of cardia: Secondary | ICD-10-CM

## 2018-10-12 DIAGNOSIS — C799 Secondary malignant neoplasm of unspecified site: Secondary | ICD-10-CM

## 2018-10-12 LAB — CBC WITH DIFFERENTIAL (CANCER CENTER ONLY)
Abs Immature Granulocytes: 0.01 10*3/uL (ref 0.00–0.07)
Basophils Absolute: 0 10*3/uL (ref 0.0–0.1)
Basophils Relative: 1 %
Eosinophils Absolute: 0.1 10*3/uL (ref 0.0–0.5)
Eosinophils Relative: 4 %
HCT: 41.3 % (ref 39.0–52.0)
Hemoglobin: 14.5 g/dL (ref 13.0–17.0)
Immature Granulocytes: 0 %
Lymphocytes Relative: 34 %
Lymphs Abs: 1.1 10*3/uL (ref 0.7–4.0)
MCH: 33.9 pg (ref 26.0–34.0)
MCHC: 35.1 g/dL (ref 30.0–36.0)
MCV: 96.5 fL (ref 80.0–100.0)
Monocytes Absolute: 0.4 10*3/uL (ref 0.1–1.0)
Monocytes Relative: 13 %
Neutro Abs: 1.6 10*3/uL — ABNORMAL LOW (ref 1.7–7.7)
Neutrophils Relative %: 48 %
Platelet Count: 129 10*3/uL — ABNORMAL LOW (ref 150–400)
RBC: 4.28 MIL/uL (ref 4.22–5.81)
RDW: 14.2 % (ref 11.5–15.5)
WBC Count: 3.4 10*3/uL — ABNORMAL LOW (ref 4.0–10.5)
nRBC: 0 % (ref 0.0–0.2)

## 2018-10-12 LAB — CMP (CANCER CENTER ONLY)
ALT: 22 U/L (ref 0–44)
AST: 27 U/L (ref 15–41)
Albumin: 3.8 g/dL (ref 3.5–5.0)
Alkaline Phosphatase: 51 U/L (ref 38–126)
Anion gap: 7 (ref 5–15)
BUN: 13 mg/dL (ref 6–20)
CO2: 27 mmol/L (ref 22–32)
Calcium: 8 mg/dL — ABNORMAL LOW (ref 8.9–10.3)
Chloride: 106 mmol/L (ref 98–111)
Creatinine: 0.99 mg/dL (ref 0.61–1.24)
GFR, Est AFR Am: >60 mL/min (ref >60)
GFR, Estimated: >60 mL/min (ref >60)
Glucose, Bld: 71 mg/dL (ref 70–99)
Potassium: 4 mmol/L (ref 3.5–5.1)
Sodium: 140 mmol/L (ref 135–145)
Total Bilirubin: 0.9 mg/dL (ref 0.3–1.2)
Total Protein: 6 g/dL — ABNORMAL LOW (ref 6.5–8.1)

## 2018-10-12 LAB — LACTATE DEHYDROGENASE: LDH: 219 U/L — ABNORMAL HIGH (ref 98–192)

## 2018-10-12 MED ORDER — SODIUM CHLORIDE 0.9% FLUSH
10.0000 mL | INTRAVENOUS | Status: DC | PRN
  Administered 2018-10-12: 10 mL
  Filled 2018-10-12: qty 10

## 2018-10-12 MED ORDER — TRASTUZUMAB CHEMO 150 MG IV SOLR
750.0000 mg | Freq: Once | INTRAVENOUS | Status: AC
  Administered 2018-10-12: 750 mg via INTRAVENOUS
  Filled 2018-10-12: qty 35.72

## 2018-10-12 MED ORDER — ACETAMINOPHEN 325 MG PO TABS
ORAL_TABLET | ORAL | Status: AC
  Filled 2018-10-12: qty 2

## 2018-10-12 MED ORDER — DIPHENHYDRAMINE HCL 25 MG PO CAPS
ORAL_CAPSULE | ORAL | Status: AC
  Filled 2018-10-12: qty 2

## 2018-10-12 MED ORDER — DIPHENHYDRAMINE HCL 25 MG PO CAPS
50.0000 mg | ORAL_CAPSULE | Freq: Once | ORAL | Status: AC
  Administered 2018-10-12: 50 mg via ORAL

## 2018-10-12 MED ORDER — ACETAMINOPHEN 325 MG PO TABS
650.0000 mg | ORAL_TABLET | Freq: Once | ORAL | Status: AC
  Administered 2018-10-12: 650 mg via ORAL

## 2018-10-12 MED ORDER — SODIUM CHLORIDE 0.9 % IV SOLN
Freq: Once | INTRAVENOUS | Status: AC
  Administered 2018-10-12: 11:00:00 via INTRAVENOUS
  Filled 2018-10-12: qty 250

## 2018-10-12 MED ORDER — HEPARIN SOD (PORK) LOCK FLUSH 100 UNIT/ML IV SOLN
500.0000 [IU] | Freq: Once | INTRAVENOUS | Status: AC | PRN
  Administered 2018-10-12: 500 [IU]
  Filled 2018-10-12: qty 5

## 2018-10-12 MED FILL — GABAPENTIN 300 MG CAPSULE: 300 | 30 days supply | Qty: 120 | Fill #1

## 2018-10-12 NOTE — Patient Instructions (Signed)
Implanted Port Insertion, Care After This sheet gives you information about how to care for yourself after your procedure. Your health care provider may also give you more specific instructions. If you have problems or questions, contact your health care provider. What can I expect after the procedure? After the procedure, it is common to have:  Discomfort at the port insertion site.  Bruising on the skin over the port. This should improve over 3-4 days. Follow these instructions at home: Port care  After your port is placed, you will get a manufacturer's information card. The card has information about your port. Keep this card with you at all times.  Take care of the port as told by your health care provider. Ask your health care provider if you or a family member can get training for taking care of the port at home. A home health care nurse may also take care of the port.  Make sure to remember what type of port you have. Incision care      Follow instructions from your health care provider about how to take care of your port insertion site. Make sure you: ? Wash your hands with soap and water before and after you change your bandage (dressing). If soap and water are not available, use hand sanitizer. ? Change your dressing as told by your health care provider. ? Leave stitches (sutures), skin glue, or adhesive strips in place. These skin closures may need to stay in place for 2 weeks or longer. If adhesive strip edges start to loosen and curl up, you may trim the loose edges. Do not remove adhesive strips completely unless your health care provider tells you to do that.  Check your port insertion site every day for signs of infection. Check for: ? Redness, swelling, or pain. ? Fluid or blood. ? Warmth. ? Pus or a bad smell. Activity  Return to your normal activities as told by your health care provider. Ask your health care provider what activities are safe for you.  Do not  lift anything that is heavier than 10 lb (4.5 kg), or the limit that you are told, until your health care provider says that it is safe. General instructions  Take over-the-counter and prescription medicines only as told by your health care provider.  Do not take baths, swim, or use a hot tub until your health care provider approves. Ask your health care provider if you may take showers. You may only be allowed to take sponge baths.  Do not drive for 24 hours if you were given a sedative during your procedure.  Wear a medical alert bracelet in case of an emergency. This will tell any health care providers that you have a port.  Keep all follow-up visits as told by your health care provider. This is important. Contact a health care provider if:  You cannot flush your port with saline as directed, or you cannot draw blood from the port.  You have a fever or chills.  You have redness, swelling, or pain around your port insertion site.  You have fluid or blood coming from your port insertion site.  Your port insertion site feels warm to the touch.  You have pus or a bad smell coming from the port insertion site. Get help right away if:  You have chest pain or shortness of breath.  You have bleeding from your port that you cannot control. Summary  Take care of the port as told by your health   care provider. Keep the manufacturer's information card with you at all times.  Change your dressing as told by your health care provider.  Contact a health care provider if you have a fever or chills or if you have redness, swelling, or pain around your port insertion site.  Keep all follow-up visits as told by your health care provider. This information is not intended to replace advice given to you by your health care provider. Make sure you discuss any questions you have with your health care provider. Document Released: 12/23/2012 Document Revised: 09/30/2017 Document Reviewed: 09/30/2017  Elsevier Patient Education  2020 [employer]vier Inc[employer]  

## 2018-10-12 NOTE — Progress Notes (Signed)
Hematology and Oncology Follow Up Visit  Matthew Reynolds [id number] [birth year] 53 y.o. 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  He really looks fantastic.  As always, he would never know that he had a problem.  He looks fantastic.  He and his wife are walking.  I think he is doing some work.  He had a really nice July 4 holiday.  He was with his family.  He has done well with the Xeloda.  I think it really helps that he he has a extra week off.  This helps his blood counts and helps with any kind of side effects that he might have.  He has had no problems with dysphasia or odynophagia.  I would like to get another upper endoscopy on him at some point just to make sure that there is nothing that is going to be recurrent locally.  He has had no abdominal pain.  There is been no melena.  He has had no obvious diarrhea.  Sometimes, he says that the stools are a little loose.  He has had no headache.  His hands and feet look quite good.  There is no erythema or cracking.  I see no evidence of PPE.    Overall, [name].      Medications:  Allergies as of 10/12/2018   No Known Allergies     Medication List       Accurate as of October 12, 2018 10:52 AM. If you have any questions, ask your nurse or doctor.        cetirizine 10 MG tablet Commonly known as: ZYRTEC Take 10 mg by mouth daily.   famotidine 40 MG tablet Commonly known as: Pepcid Take 1 tablet (40 mg total) by mouth 2 (two) times daily.   gabapentin 300 MG capsule Commonly known as: NEURONTIN Take 1 capsule in the morning and 1 in the afternoon.  Take 2 at bedtime   lidocaine-prilocaine cream Commonly known as: EMLA Apply 1  application topically as needed.   polyethylene glycol 17 g packet Commonly known as: MIRALAX / GLYCOLAX Take 17 g by mouth 2 (two) times daily.   pyridoxine 200 MG tablet Commonly known as: B-6 Take two tablets in the morning and one tablet at night.   Xeloda 500 MG tablet Generic drug: capecitabine Take 5 tablets (2,500 mg total) by mouth 2 (two) times daily after a meal. Take for 14 days, then hold for 7 days. Repeat every 21 days.       Allergies: No Known Allergies  Past Medical History, Surgical history, Social history, and Family History were reviewed and updated.  Review of Systems: Review of Systems  Constitutional: Negative.   HENT: Negative.   Eyes: Negative.   Respiratory: Negative.   Cardiovascular: Negative.   Gastrointestinal: Negative.   Genitourinary: Negative.   Musculoskeletal: Negative.   Skin: Negative.   Neurological: Positive for tingling.  Endo/Heme/Allergies: Negative.   Psychiatric/Behavioral: Negative.    Marland Kitchen   Physical Exam:  weight is 205 lb 1.9 oz (93 kg). His oral temperature is 97.7 F (36.5 C). His blood pressure is 141/81 (abnormal) and his pulse is 71. His respiration is 20 and  oxygen saturation is 100%.   Wt Readings from Last 3 Encounters:  10/12/18 205 lb 1.9 oz (93 kg)  09/14/18 209 lb (94.8 kg)  08/17/18 207 lb (93.9 kg)    Physical Exam Vitals signs reviewed.  HENT:     Head: Normocephalic and atraumatic.  Eyes:     Pupils: Pupils are equal, round, and reactive to light.  Neck:     Musculoskeletal: Normal range of motion.  Cardiovascular:     Rate and Rhythm: Normal rate and regular rhythm.     Heart sounds: Normal heart sounds.  Pulmonary:     Effort: Pulmonary effort is normal.     Breath sounds: Normal breath sounds.  Abdominal:     General: Bowel sounds are normal.     Palpations: Abdomen is soft.  Musculoskeletal: Normal range of motion.        General: No tenderness or deformity.  Lymphadenopathy:      Cervical: No cervical adenopathy.  Skin:    General: Skin is warm and dry.     Findings: No erythema or rash.  Neurological:     Mental Status: He is alert and oriented to person, place, and time.  Psychiatric:        Behavior: Behavior normal.        Thought Content: Thought content normal.        Judgment: Judgment normal.      Lab Results  Component Value Date   WBC 3.4 (L) 10/12/2018   HGB 14.5 10/12/2018   HCT 41.3 10/12/2018   MCV 96.5 10/12/2018   PLT 129 (L) 10/12/2018   Lab Results  Component Value Date   FERRITIN 92 07/14/2018   IRON 127 07/14/2018   TIBC 309 07/14/2018   UIBC 182 07/14/2018   IRONPCTSAT 41 07/14/2018   Lab Results  Component Value Date   RBC 4.28 10/12/2018   No results found for: KPAFRELGTCHN, LAMBDASER, KAPLAMBRATIO No results found for: IGGSERUM, IGA, IGMSERUM No results found for: Odetta Pink, SPEI   Chemistry      Component Value Date/Time   NA 138 09/14/2018 1100   K 4.1 09/14/2018 1100   CL 105 09/14/2018 1100   CO2 27 09/14/2018 1100   BUN 14 09/14/2018 1100   CREATININE 1.04 09/14/2018 1100      Component Value Date/Time   CALCIUM 9.0 09/14/2018 1100   ALKPHOS 51 09/14/2018 1100   AST 26 09/14/2018 1100   ALT 22 09/14/2018 1100   BILITOT 0.8 09/14/2018 1100       [name] is a very pleasant 54 yo caucasian gentleman with metastatic adenocarcinoma of the GE junction, HER-2 positive.   We will continue him on the Xeloda/Herceptin protocol.   I would plan for another set of scans after his eighth cycle of treatment.  At that point, we will have to decide how to proceed if there is further maintenance type therapy.  Would be nice just to utilize Herceptin maybe even Herceptin/[name] since his tumor is HER-2 positive.  I plan to see him back in another 4 weeks.  [name], MD 7/27/202010:52 AM

## 2018-10-12 NOTE — Patient Instructions (Signed)
Trastuzumab injection for infusion What is this medicine? TRASTUZUMAB (tras TOO zoo mab) is a monoclonal antibody. It is used to treat breast cancer and stomach cancer. This medicine may be used for other purposes; ask your health care provider or pharmacist if you have questions. COMMON BRAND NAME(S): Herceptin, Herzuma, KANJINTI, Ogivri, Ontruzant, Trazimera What should I tell my health care provider before I take this medicine? They need to know if you have any of these conditions:  heart disease  heart failure  lung or breathing disease, like asthma  an unusual or allergic reaction to trastuzumab, benzyl alcohol, or other medications, foods, dyes, or preservatives  pregnant or trying to get pregnant  breast-feeding How should I use this medicine? This drug is given as an infusion into a vein. It is administered in a hospital or clinic by a specially trained health care professional. Talk to your pediatrician regarding the use of this medicine in children. This medicine is not approved for use in children. Overdosage: If you think you have taken too much of this medicine contact a poison control center or emergency room at once. NOTE: This medicine is only for you. Do not share this medicine with others. What if I miss a dose? It is important not to miss a dose. Call your doctor or health care professional if you are unable to keep an appointment. What may interact with this medicine? This medicine may interact with the following medications:  certain types of chemotherapy, such as daunorubicin, doxorubicin, epirubicin, and idarubicin This list may not describe all possible interactions. Give your health care provider a list of all the medicines, herbs, non-prescription drugs, or dietary supplements you use. Also tell them if you smoke, drink alcohol, or use illegal drugs. Some items may interact with your medicine. What should I watch for while using this medicine? Visit your  doctor for checks on your progress. Report any side effects. Continue your course of treatment even though you feel ill unless your doctor tells you to stop. Call your doctor or health care professional for advice if you get a fever, chills or sore throat, or other symptoms of a cold or flu. Do not treat yourself. Try to avoid being around people who are sick. You may experience fever, chills and shaking during your first infusion. These effects are usually mild and can be treated with other medicines. Report any side effects during the infusion to your health care professional. Fever and chills usually do not happen with later infusions. Do not become pregnant while taking this medicine or for 7 months after stopping it. Women should inform their doctor if they wish to become pregnant or think they might be pregnant. Women of child-bearing potential will need to have a negative pregnancy test before starting this medicine. There is a potential for serious side effects to an unborn child. Talk to your health care professional or pharmacist for more information. Do not breast-feed an infant while taking this medicine or for 7 months after stopping it. Women must use effective birth control with this medicine. What side effects may I notice from receiving this medicine? Side effects that you should report to your doctor or health care professional as soon as possible:  allergic reactions like skin rash, itching or hives, swelling of the face, lips, or tongue  chest pain or palpitations  cough  dizziness  feeling faint or lightheaded, falls  fever  general ill feeling or flu-like symptoms  signs of worsening heart failure like   breathing problems; swelling in your legs and feet  unusually weak or tired Side effects that usually do not require medical attention (report to your doctor or health care professional if they continue or are bothersome):  bone pain  changes in  taste  diarrhea  joint pain  nausea/vomiting  weight loss This list may not describe all possible side effects. Call your doctor for medical advice about side effects. You may report side effects to FDA at 1-800-FDA-1088. Where should I keep my medicine? This drug is given in a hospital or clinic and will not be stored at home. NOTE: This sheet is a summary. It may not cover all possible information. If you have questions about this medicine, talk to your doctor, pharmacist, or health care provider.  2020 Elsevier/Gold Standard (2016-02-27 14:37:52)  

## 2018-10-15 MED FILL — FAMOTIDINE 40 MG TABLET: 40 | 30 days supply | Qty: 60 | Fill #0

## 2018-10-15 MED FILL — VIT B-6 200 MG TABLET: 200 MG | 20 days supply | Qty: 60 | Fill #1

## 2018-11-09 ENCOUNTER — Inpatient Hospital Stay (HOSPITAL_BASED_OUTPATIENT_CLINIC_OR_DEPARTMENT_OTHER): Payer: 59 | Admitting: Hematology & Oncology

## 2018-11-09 ENCOUNTER — Inpatient Hospital Stay: Payer: 59 | Attending: Hematology & Oncology

## 2018-11-09 ENCOUNTER — Other Ambulatory Visit: Payer: Self-pay

## 2018-11-09 ENCOUNTER — Other Ambulatory Visit: Payer: Self-pay | Admitting: Oncology

## 2018-11-09 ENCOUNTER — Encounter: Payer: Self-pay | Admitting: Hematology & Oncology

## 2018-11-09 ENCOUNTER — Inpatient Hospital Stay: Payer: 59

## 2018-11-09 VITALS — BP 139/89 | HR 82 | Temp 97.5°F | Resp 20 | Wt 209.0 lb

## 2018-11-09 DIAGNOSIS — C799 Secondary malignant neoplasm of unspecified site: Secondary | ICD-10-CM

## 2018-11-09 DIAGNOSIS — Z79899 Other long term (current) drug therapy: Secondary | ICD-10-CM | POA: Diagnosis not present

## 2018-11-09 DIAGNOSIS — C160 Malignant neoplasm of cardia: Secondary | ICD-10-CM | POA: Insufficient documentation

## 2018-11-09 DIAGNOSIS — D500 Iron deficiency anemia secondary to blood loss (chronic): Secondary | ICD-10-CM

## 2018-11-09 DIAGNOSIS — Z5112 Encounter for antineoplastic immunotherapy: Secondary | ICD-10-CM | POA: Insufficient documentation

## 2018-11-09 LAB — CMP (CANCER CENTER ONLY)
ALT: 24 U/L (ref 0–44)
AST: 29 U/L (ref 15–41)
Albumin: 3.8 g/dL (ref 3.5–5.0)
Alkaline Phosphatase: 53 U/L (ref 38–126)
Anion gap: 9 (ref 5–15)
BUN: 12 mg/dL (ref 6–20)
CO2: 26 mmol/L (ref 22–32)
Calcium: 8.2 mg/dL — ABNORMAL LOW (ref 8.9–10.3)
Chloride: 107 mmol/L (ref 98–111)
Creatinine: 1.12 mg/dL (ref 0.61–1.24)
GFR, Est AFR Am: >60 mL/min (ref >60)
GFR, Estimated: >60 mL/min (ref >60)
Glucose, Bld: 114 mg/dL — ABNORMAL HIGH (ref 70–99)
Potassium: 3.9 mmol/L (ref 3.5–5.1)
Sodium: 142 mmol/L (ref 135–145)
Total Bilirubin: 0.6 mg/dL (ref 0.3–1.2)
Total Protein: 6.5 g/dL (ref 6.5–8.1)

## 2018-11-09 LAB — CBC WITH DIFFERENTIAL (CANCER CENTER ONLY)
Abs Immature Granulocytes: 0.01 10*3/uL (ref 0.00–0.07)
Basophils Absolute: 0 10*3/uL (ref 0.0–0.1)
Basophils Relative: 1 %
Eosinophils Absolute: 0.1 10*3/uL (ref 0.0–0.5)
Eosinophils Relative: 4 %
HCT: 44.1 % (ref 39.0–52.0)
Hemoglobin: 15.1 g/dL (ref 13.0–17.0)
Immature Granulocytes: 0 %
Lymphocytes Relative: 46 %
Lymphs Abs: 1.5 10*3/uL (ref 0.7–4.0)
MCH: 32.8 pg (ref 26.0–34.0)
MCHC: 34.2 g/dL (ref 30.0–36.0)
MCV: 95.7 fL (ref 80.0–100.0)
Monocytes Absolute: 0.4 10*3/uL (ref 0.1–1.0)
Monocytes Relative: 11 %
Neutro Abs: 1.2 10*3/uL — ABNORMAL LOW (ref 1.7–7.7)
Neutrophils Relative %: 38 %
Platelet Count: 140 10*3/uL — ABNORMAL LOW (ref 150–400)
RBC: 4.61 MIL/uL (ref 4.22–5.81)
RDW: 14.3 % (ref 11.5–15.5)
WBC Count: 3.2 10*3/uL — ABNORMAL LOW (ref 4.0–10.5)
nRBC: 0 % (ref 0.0–0.2)

## 2018-11-09 MED ORDER — TRASTUZUMAB CHEMO 150 MG IV SOLR
750.0000 mg | Freq: Once | INTRAVENOUS | Status: AC
  Administered 2018-11-09: 750 mg via INTRAVENOUS
  Filled 2018-11-09: qty 35.72

## 2018-11-09 MED ORDER — ACETAMINOPHEN 325 MG PO TABS
650.0000 mg | ORAL_TABLET | Freq: Once | ORAL | Status: AC
  Administered 2018-11-09: 650 mg via ORAL

## 2018-11-09 MED ORDER — PYRIDOXINE HCL 200 MG PO TABS: ORAL_TABLET | ORAL | 6 refills | Status: DC

## 2018-11-09 MED ORDER — ACETAMINOPHEN 325 MG PO TABS
ORAL_TABLET | ORAL | Status: AC
  Filled 2018-11-09: qty 1

## 2018-11-09 MED ORDER — DIPHENHYDRAMINE HCL 25 MG PO CAPS
50.0000 mg | ORAL_CAPSULE | Freq: Once | ORAL | Status: AC
  Administered 2018-11-09: 50 mg via ORAL

## 2018-11-09 MED ORDER — SODIUM CHLORIDE 0.9 % IV SOLN
Freq: Once | INTRAVENOUS | Status: AC
  Administered 2018-11-09: 10:00:00 via INTRAVENOUS
  Filled 2018-11-09: qty 250

## 2018-11-09 MED ORDER — HEPARIN SOD (PORK) LOCK FLUSH 100 UNIT/ML IV SOLN
500.0000 [IU] | Freq: Once | INTRAVENOUS | Status: AC
  Administered 2018-11-09: 500 [IU] via INTRAVENOUS
  Filled 2018-11-09: qty 5

## 2018-11-09 MED ORDER — FAMOTIDINE 40 MG PO TABS: 40.0000 mg | ORAL_TABLET | Freq: Two times a day (BID) | ORAL | 6 refills | Status: DC

## 2018-11-09 MED ORDER — SODIUM CHLORIDE 0.9% FLUSH
10.0000 mL | INTRAVENOUS | Status: DC | PRN
  Administered 2018-11-09: 10 mL via INTRAVENOUS
  Filled 2018-11-09: qty 10

## 2018-11-09 MED ORDER — DIPHENHYDRAMINE HCL 25 MG PO CAPS
ORAL_CAPSULE | ORAL | Status: AC
  Filled 2018-11-09: qty 2

## 2018-11-09 MED ORDER — SODIUM CHLORIDE 0.9% FLUSH
10.0000 mL | Freq: Once | INTRAVENOUS | Status: AC | PRN
  Administered 2018-11-09: 10 mL
  Filled 2018-11-09: qty 10

## 2018-11-09 MED FILL — FAMOTIDINE 40 MG TABLET: 40 | 30 days supply | Qty: 60 | Fill #0

## 2018-11-09 MED FILL — VIT B-6 200 MG TABLET: 200 MG | 20 days supply | Qty: 60 | Fill #0

## 2018-11-09 NOTE — Patient Instructions (Signed)

## 2018-11-09 NOTE — Progress Notes (Signed)
Hematology and Oncology Follow Up Visit  Matthew Reynolds [id number] [birth year] 53 y.o. 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  He really looks fantastic.  He is tolerated the Xeloda with Herceptin quite well.  He has had really no problems with respect to PPE.  He has been very aggressive with trying to prevent this from happening.  He has had no problems with nausea or vomiting.  There is no dysphasia.  He has had no abdominal pain.  He has had no diarrhea.  He has had no bleeding.  He has had no shortness of breath.  There is been no leg swelling.   He had an echocardiogram done on 10/05/2018.  This showed ejection fraction of 60-65%.  Overall, I would say performance status is ECOG 0    Medications:  Allergies as of 11/09/2018   No Known Allergies     Medication List       Accurate as of November 09, 2018  8:50 AM. If you have any questions, ask your nurse or doctor.        cetirizine 10 MG tablet Commonly known as: ZYRTEC Take 10 mg by mouth daily.   famotidine 40 MG tablet Commonly known as: Pepcid Take 1 tablet (40 mg total) by mouth 2 (two) times daily.   gabapentin 300 MG capsule Commonly known as: NEURONTIN Take 1 capsule in the morning and 1 in the afternoon.  Take 2 at bedtime   lidocaine-prilocaine cream Commonly known as: EMLA Apply 1 application topically as needed.   polyethylene glycol 17 g packet Commonly known as: MIRALAX / GLYCOLAX Take 17 g by mouth 2 (two) times daily.   pyridoxine 200 MG tablet Commonly known as: B-6 Take two tablets in the morning and one tablet at night.   Xeloda 500 MG tablet Generic drug: capecitabine Take 5 tablets (2,500 mg total) by mouth 2 (two) times  daily after a meal. Take for 14 days, then hold for 7 days. Repeat every 21 days.       Allergies: No Known Allergies  Past Medical History, Surgical history, Social history, and Family History were reviewed and updated.  Review of Systems: Review of Systems  Constitutional: Negative.   HENT: Negative.   Eyes: Negative.   Respiratory: Negative.   Cardiovascular: Negative.   Gastrointestinal: Negative.   Genitourinary: Negative.   Musculoskeletal: Negative.   Skin: Negative.   Neurological: Positive for tingling.  Endo/Heme/Allergies: Negative.   Psychiatric/Behavioral: Negative.    Marland Kitchen   Physical Exam:  weight is 209 lb (94.8 kg). His oral temperature is 97.5 F (36.4 C) (abnormal). His blood pressure is 139/89 and his pulse is 82. His respiration is 20 and oxygen saturation is 98%.   Wt Readings from Last 3 Encounters:  11/09/18 209 lb (94.8 kg)  10/12/18 205 lb 1.9 oz (93 kg)  09/14/18 209 lb (94.8 kg)    Physical Exam Vitals signs reviewed.  HENT:     Head: Normocephalic and atraumatic.  Eyes:     Pupils: Pupils are equal, round, and reactive to light.  Neck:     Musculoskeletal: Normal range of motion.  Cardiovascular:     Rate and Rhythm: Normal rate and  regular rhythm.     Heart sounds: Normal heart sounds.  Pulmonary:     Effort: Pulmonary effort is normal.     Breath sounds: Normal breath sounds.  Abdominal:     General: Bowel sounds are normal.     Palpations: Abdomen is soft.  Musculoskeletal: Normal range of motion.        General: No tenderness or deformity.  Lymphadenopathy:     Cervical: No cervical adenopathy.  Skin:    General: Skin is warm and dry.     Findings: No erythema or rash.  Neurological:     Mental Status: He is alert and oriented to person, place, and time.  Psychiatric:        Behavior: Behavior normal.        Thought Content: Thought content normal.        Judgment: Judgment normal.      Lab Results  Component Value  Date   WBC 3.4 (L) 10/12/2018   HGB 14.5 10/12/2018   HCT 41.3 10/12/2018   MCV 96.5 10/12/2018   PLT 129 (L) 10/12/2018   Lab Results  Component Value Date   FERRITIN 92 07/14/2018   IRON 127 07/14/2018   TIBC 309 07/14/2018   UIBC 182 07/14/2018   IRONPCTSAT 41 07/14/2018   Lab Results  Component Value Date   RBC 4.28 10/12/2018   No results found for: KPAFRELGTCHN, LAMBDASER, KAPLAMBRATIO No results found for: IGGSERUM, IGA, IGMSERUM No results found for: Odetta Pink, SPEI   Chemistry      Component Value Date/Time   NA 140 10/12/2018 1027   K 4.0 10/12/2018 1027   CL 106 10/12/2018 1027   CO2 27 10/12/2018 1027   BUN 13 10/12/2018 1027   CREATININE 0.99 10/12/2018 1027      Component Value Date/Time   CALCIUM 8.0 (L) 10/12/2018 1027   ALKPHOS 51 10/12/2018 1027   AST 27 10/12/2018 1027   ALT 22 10/12/2018 1027   BILITOT 0.9 10/12/2018 1027       [name] is a very pleasant 53 yo caucasian gentleman with metastatic adenocarcinoma of the GE junction, HER-2 positive.   We will go ahead with his eighth cycle of treatment.  After this cycle, I will then rescan him.  I still believe that the real key for him is the fact that his cancer is HER-2 positive.  Maybe, we can just consider using Herceptin/[name] for his maintenance therapy and try to give him a "holiday" from the Xeloda.  I will plan to scan him in 3 weeks.  We will get him back in 4 weeks.     [name], MD 8/24/20208:50 AM

## 2018-11-09 NOTE — Patient Instructions (Signed)
Trastuzumab; Hyaluronidase injection What is this medicine? TRASTUZUMAB; HYALURONIDASE (tras TOO zoo mab / hye al ur ON i dase) is used to treat breast cancer and stomach cancer. Trastuzumab is a monoclonal antibody. Hyaluronidase is used to improve the effects of trastuzumab. This medicine may be used for other purposes; ask your health care provider or pharmacist if you have questions. COMMON BRAND NAME(S): HERCEPTIN HYLECTA What should I tell my health care provider before I take this medicine? They need to know if you have any of these conditions:  heart disease  heart failure  lung or breathing disease, like asthma  an unusual or allergic reaction to trastuzumab, or other medications, foods, dyes, or preservatives  pregnant or trying to get pregnant  breast-feeding How should I use this medicine? This medicine is for injection under the skin. It is given by a health care professional in a hospital or clinic setting. Talk to your pediatrician regarding the use of this medicine in children. This medicine is not approved for use in children. Overdosage: If you think you have taken too much of this medicine contact a poison control center or emergency room at once. NOTE: This medicine is only for you. Do not share this medicine with others. What if I miss a dose? It is important not to miss a dose. Call your doctor or health care professional if you are unable to keep an appointment. What may interact with this medicine? This medicine may interact with the following medications:  certain types of chemotherapy, such as daunorubicin, doxorubicin, epirubicin, and idarubicin This list may not describe all possible interactions. Give your health care provider a list of all the medicines, herbs, non-prescription drugs, or dietary supplements you use. Also tell them if you smoke, drink alcohol, or use illegal drugs. Some items may interact with your medicine. What should I watch for while  using this medicine? Visit your doctor for checks on your progress. Report any side effects. Continue your course of treatment even though you feel ill unless your doctor tells you to stop. Call your doctor or health care professional for advice if you get a fever, chills or sore throat, or other symptoms of a cold or flu. Do not treat yourself. Try to avoid being around people who are sick. You may experience fever, chills and shaking during your first infusion. These effects are usually mild and can be treated with other medicines. Report any side effects during the infusion to your health care professional. Fever and chills usually do not happen with later infusions. Do not become pregnant while taking this medicine or for 7 months after stopping it. Women should inform their doctor if they wish to become pregnant or think they might be pregnant. Women of child-bearing potential will need to have a negative pregnancy test before starting this medicine. There is a potential for serious side effects to an unborn child. Talk to your health care professional or pharmacist for more information. Do not breast-feed an infant while taking this medicine or for 7 months after stopping it. What side effects may I notice from receiving this medicine? Side effects that you should report to your doctor or health care professional as soon as possible:  allergic reactions like skin rash, itching or hives, swelling of the face, lips, or tongue  breathing problems  chest pain or palpitations  cough  fever  general ill feeling or flu-like symptoms  signs of worsening heart failure like breathing problems; swelling in your legs and  feet Side effects that usually do not require medical attention (report these to your doctor or health care professional if they continue or are bothersome):  bone pain  changes in taste  diarrhea  joint pain  nausea/vomiting  unusually weak or tired  weight loss This  list may not describe all possible side effects. Call your doctor for medical advice about side effects. You may report side effects to FDA at 1-800-FDA-1088. Where should I keep my medicine? This drug is given in a hospital or clinic and will not be stored at home. NOTE: This sheet is a summary. It may not cover all possible information. If you have questions about this medicine, talk to your doctor, pharmacist, or health care provider.  2020 Elsevier/Gold Standard (2017-05-23 21:54:17)

## 2018-11-11 ENCOUNTER — Other Ambulatory Visit: Payer: Self-pay | Admitting: Hematology & Oncology

## 2018-11-11 DIAGNOSIS — C160 Malignant neoplasm of cardia: Secondary | ICD-10-CM

## 2018-11-11 DIAGNOSIS — C799 Secondary malignant neoplasm of unspecified site: Secondary | ICD-10-CM

## 2018-11-24 MED FILL — GABAPENTIN 300 MG CAPSULE: 300 | 30 days supply | Qty: 120 | Fill #2

## 2018-11-30 ENCOUNTER — Other Ambulatory Visit: Payer: Self-pay

## 2018-11-30 ENCOUNTER — Encounter (HOSPITAL_COMMUNITY)
Admission: RE | Admit: 2018-11-30 | Discharge: 2018-11-30 | Disposition: A | Discharge status: Home / Self Care | Payer: 59 | Source: Ambulatory Visit | Attending: Hematology & Oncology | Admitting: Hematology & Oncology

## 2018-11-30 ENCOUNTER — Telehealth: Payer: Self-pay | Admitting: *Deleted

## 2018-11-30 DIAGNOSIS — C160 Malignant neoplasm of cardia: Secondary | ICD-10-CM | POA: Diagnosis not present

## 2018-11-30 LAB — GLUCOSE, CAPILLARY: Glucose-Capillary: 89 mg/dL (ref 70–99)

## 2018-11-30 MED ORDER — FLUDEOXYGLUCOSE F - 18 (FDG) INJECTION
11.3000 | Freq: Once | INTRAVENOUS | Status: AC
  Administered 2018-11-30: 11.3 via INTRAVENOUS

## 2018-11-30 NOTE — Telephone Encounter (Signed)
Notified pt of pet results. Pt verbalized understanding

## 2018-11-30 NOTE — Telephone Encounter (Signed)
-----   Message from [name], MD sent at 11/30/2018  1:49 PM EDT ----- Please call and tell him that there is no evidence of active cancer on the PET scan.  This is absolutely fantastic.  Thanks much.

## 2018-12-07 ENCOUNTER — Other Ambulatory Visit: Payer: Self-pay

## 2018-12-07 ENCOUNTER — Inpatient Hospital Stay: Payer: 59

## 2018-12-07 ENCOUNTER — Telehealth: Payer: Self-pay | Admitting: Hematology & Oncology

## 2018-12-07 ENCOUNTER — Inpatient Hospital Stay: Payer: 59 | Attending: Hematology & Oncology

## 2018-12-07 ENCOUNTER — Encounter: Payer: Self-pay | Admitting: Hematology & Oncology

## 2018-12-07 ENCOUNTER — Inpatient Hospital Stay (HOSPITAL_BASED_OUTPATIENT_CLINIC_OR_DEPARTMENT_OTHER): Payer: 59 | Admitting: Hematology & Oncology

## 2018-12-07 DIAGNOSIS — T451X5S Adverse effect of antineoplastic and immunosuppressive drugs, sequela: Secondary | ICD-10-CM | POA: Diagnosis not present

## 2018-12-07 DIAGNOSIS — Z5112 Encounter for antineoplastic immunotherapy: Secondary | ICD-10-CM | POA: Insufficient documentation

## 2018-12-07 DIAGNOSIS — Z23 Encounter for immunization: Secondary | ICD-10-CM | POA: Insufficient documentation

## 2018-12-07 DIAGNOSIS — G620 Drug-induced polyneuropathy: Secondary | ICD-10-CM | POA: Diagnosis not present

## 2018-12-07 DIAGNOSIS — C160 Malignant neoplasm of cardia: Secondary | ICD-10-CM

## 2018-12-07 DIAGNOSIS — Z9221 Personal history of antineoplastic chemotherapy: Secondary | ICD-10-CM | POA: Diagnosis not present

## 2018-12-07 DIAGNOSIS — Z79899 Other long term (current) drug therapy: Secondary | ICD-10-CM | POA: Diagnosis not present

## 2018-12-07 LAB — CBC WITH DIFFERENTIAL (CANCER CENTER ONLY)
Abs Immature Granulocytes: 0.01 10*3/uL (ref 0.00–0.07)
Basophils Absolute: 0 10*3/uL (ref 0.0–0.1)
Basophils Relative: 1 %
Eosinophils Absolute: 0.1 10*3/uL (ref 0.0–0.5)
Eosinophils Relative: 3 %
HCT: 44.7 % (ref 39.0–52.0)
Hemoglobin: 15.3 g/dL (ref 13.0–17.0)
Immature Granulocytes: 0 %
Lymphocytes Relative: 34 %
Lymphs Abs: 1.2 10*3/uL (ref 0.7–4.0)
MCH: 32 pg (ref 26.0–34.0)
MCHC: 34.2 g/dL (ref 30.0–36.0)
MCV: 93.5 fL (ref 80.0–100.0)
Monocytes Absolute: 0.4 10*3/uL (ref 0.1–1.0)
Monocytes Relative: 10 %
Neutro Abs: 1.9 10*3/uL (ref 1.7–7.7)
Neutrophils Relative %: 52 %
Platelet Count: 139 10*3/uL — ABNORMAL LOW (ref 150–400)
RBC: 4.78 MIL/uL (ref 4.22–5.81)
RDW: 14.8 % (ref 11.5–15.5)
WBC Count: 3.6 10*3/uL — ABNORMAL LOW (ref 4.0–10.5)
nRBC: 0 % (ref 0.0–0.2)

## 2018-12-07 LAB — CMP (CANCER CENTER ONLY)
ALT: 23 U/L (ref 0–44)
AST: 27 U/L (ref 15–41)
Albumin: 3.9 g/dL (ref 3.5–5.0)
Alkaline Phosphatase: 61 U/L (ref 38–126)
Anion gap: 8 (ref 5–15)
BUN: 14 mg/dL (ref 6–20)
CO2: 27 mmol/L (ref 22–32)
Calcium: 8.8 mg/dL — ABNORMAL LOW (ref 8.9–10.3)
Chloride: 104 mmol/L (ref 98–111)
Creatinine: 1.15 mg/dL (ref 0.61–1.24)
GFR, Est AFR Am: >60 mL/min (ref >60)
GFR, Estimated: >60 mL/min (ref >60)
Glucose, Bld: 139 mg/dL — ABNORMAL HIGH (ref 70–99)
Potassium: 3.8 mmol/L (ref 3.5–5.1)
Sodium: 139 mmol/L (ref 135–145)
Total Bilirubin: 0.9 mg/dL (ref 0.3–1.2)
Total Protein: 6.6 g/dL (ref 6.5–8.1)

## 2018-12-07 MED ORDER — DIPHENHYDRAMINE HCL 25 MG PO CAPS
ORAL_CAPSULE | ORAL | Status: AC
  Filled 2018-12-07: qty 2

## 2018-12-07 MED ORDER — SODIUM CHLORIDE 0.9% FLUSH
10.0000 mL | INTRAVENOUS | Status: DC | PRN
  Administered 2018-12-07: 10 mL
  Filled 2018-12-07: qty 10

## 2018-12-07 MED ORDER — INFLUENZA VAC SPLIT QUAD 0.5 ML IM SUSY
0.5000 mL | PREFILLED_SYRINGE | Freq: Once | INTRAMUSCULAR | Status: AC
  Administered 2018-12-07: 0.5 mL via INTRAMUSCULAR

## 2018-12-07 MED ORDER — ACETAMINOPHEN 325 MG PO TABS
650.0000 mg | ORAL_TABLET | Freq: Once | ORAL | Status: AC
  Administered 2018-12-07: 650 mg via ORAL

## 2018-12-07 MED ORDER — HEPARIN SOD (PORK) LOCK FLUSH 100 UNIT/ML IV SOLN
500.0000 [IU] | Freq: Once | INTRAVENOUS | Status: AC | PRN
  Administered 2018-12-07: 500 [IU]
  Filled 2018-12-07: qty 5

## 2018-12-07 MED ORDER — TRASTUZUMAB CHEMO 150 MG IV SOLR
750.0000 mg | Freq: Once | INTRAVENOUS | Status: AC
  Administered 2018-12-07: 750 mg via INTRAVENOUS
  Filled 2018-12-07: qty 28.57

## 2018-12-07 MED ORDER — ACETAMINOPHEN 325 MG PO TABS
ORAL_TABLET | ORAL | Status: AC
  Filled 2018-12-07: qty 2

## 2018-12-07 MED ORDER — DIPHENHYDRAMINE HCL 25 MG PO CAPS
50.0000 mg | ORAL_CAPSULE | Freq: Once | ORAL | Status: AC
  Administered 2018-12-07: 50 mg via ORAL

## 2018-12-07 MED ORDER — INFLUENZA VAC SPLIT QUAD 0.5 ML IM SUSY
PREFILLED_SYRINGE | INTRAMUSCULAR | Status: AC
  Filled 2018-12-07: qty 0.5

## 2018-12-07 MED ORDER — SODIUM CHLORIDE 0.9 % IV SOLN
Freq: Once | INTRAVENOUS | Status: AC
  Administered 2018-12-07: 12:00:00 via INTRAVENOUS
  Filled 2018-12-07: qty 250

## 2018-12-07 NOTE — Patient Instructions (Signed)

## 2018-12-07 NOTE — Telephone Encounter (Signed)
Appointments scheduled patient stated he did not want copy of calendar due to My Chart/ per 9/21 los

## 2018-12-07 NOTE — Progress Notes (Signed)
Hematology and Oncology Follow Up Visit  Matthew Reynolds [id number] Aug 14, 1965 53 y.o. 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  He really looks fantastic.  So far, things are really going well for him.  We did his PET scan.  This was done on 11/30/2018.  There was no evidence of any active disease.  As such, I would have to say that he is in remission right now.  I still think that the whole "key" for him has been that his tumor has been HER-2 positive.  We will now get him on maintenance therapy with Herceptin.  I am going to see if we might be able to add [name] to that.  I believe that there have been some recent trial that seemed to suggest that there is a benefit to [name] for GI tumors that are HER-2 positive.  He has had no problems eating.  There is no problems swallowing.  I would I do not think that we need to put him through an upper endoscopy right now since the PET scan is unremarkable and that he does not have any activity on the PET scan.  He has had no problems with bowels or bladder.  There is no bleeding.  He has had no problems with pain.  He has had no cough.  There has been no leg swelling.  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  Overall, I would say performance status is ECOG 0    Medications:  Allergies as of 12/07/2018   No Known Allergies     Medication List       Accurate as of December 07, 2018 11:23 AM. If you have any questions, ask your nurse or doctor.        cetirizine 10 MG tablet Commonly known as: ZYRTEC Take 10 mg by mouth daily.   famotidine 40 MG tablet Commonly known as: Pepcid Take 1 tablet (40 mg total)  by mouth 2 (two) times daily.   gabapentin 300 MG capsule Commonly known as: NEURONTIN Take 1 capsule in the morning and 1 in the afternoon.  Take 2 at bedtime   lidocaine-prilocaine cream Commonly known as: EMLA Apply 1 application topically as needed.   polyethylene glycol 17 g packet Commonly known as: MIRALAX / GLYCOLAX Take 17 g by mouth 2 (two) times daily.   pyridoxine 200 MG tablet Commonly known as: B-6 Take two tablets in the morning and one tablet at night.   Xeloda 500 MG tablet Generic drug: capecitabine TAKE 5 TABLETS BY MOUTH 2  TIMES DAILY AFTER A MEAL.  TAKE FOR 14 DAYS, THEN HOLD FOR 7 DAYS. REPEAT EVERY 21 DAYS.       Allergies: No Known Allergies  Past Medical History, Surgical history, Social history, and Family History were reviewed and updated.  Review of Systems: Review of Systems  Constitutional: Negative.   HENT: Negative.   Eyes: Negative.   Respiratory: Negative.   Cardiovascular: Negative.   Gastrointestinal: Negative.   Genitourinary: Negative.   Musculoskeletal: Negative.   Skin: Negative.   Neurological: Positive for tingling.  Endo/Heme/Allergies: Negative.   Psychiatric/Behavioral: Negative.    Marland Kitchen  Physical Exam:  vitals were not taken for this visit.   Wt Readings from Last 3 Encounters:  12/07/18 208 lb (94.3 kg)  11/09/18 209 lb (94.8 kg)  10/12/18 205 lb 1.9 oz (93 kg)    Physical Exam Vitals signs reviewed.  HENT:     Head: Normocephalic and atraumatic.  Eyes:     Pupils: Pupils are equal, round, and reactive to light.  Neck:     Musculoskeletal: Normal range of motion.  Cardiovascular:     Rate and Rhythm: Normal rate and regular rhythm.     Heart sounds: Normal heart sounds.  Pulmonary:     Effort: Pulmonary effort is normal.     Breath sounds: Normal breath sounds.  Abdominal:     General: Bowel sounds are normal.     Palpations: Abdomen is soft.  Musculoskeletal: Normal range of motion.        General:  No tenderness or deformity.  Lymphadenopathy:     Cervical: No cervical adenopathy.  Skin:    General: Skin is warm and dry.     Findings: No erythema or rash.  Neurological:     Mental Status: He is alert and oriented to person, place, and time.  Psychiatric:        Behavior: Behavior normal.        Thought Content: Thought content normal.        Judgment: Judgment normal.      Lab Results  Component Value Date   WBC 3.6 (L) 12/07/2018   HGB 15.3 12/07/2018   HCT 44.7 12/07/2018   MCV 93.5 12/07/2018   PLT 139 (L) 12/07/2018   Lab Results  Component Value Date   FERRITIN 92 07/14/2018   IRON 127 07/14/2018   TIBC 309 07/14/2018   UIBC 182 07/14/2018   IRONPCTSAT 41 07/14/2018   Lab Results  Component Value Date   RBC 4.78 12/07/2018   No results found for: KPAFRELGTCHN, LAMBDASER, KAPLAMBRATIO No results found for: IGGSERUM, IGA, IGMSERUM No results found for: Ronnald Ramp, A1GS, A2GS, Violet Baldy, MSPIKE, SPEI   Chemistry      Component Value Date/Time   NA 139 12/07/2018 1000   K 3.8 12/07/2018 1000   CL 104 12/07/2018 1000   CO2 27 12/07/2018 1000   BUN 14 12/07/2018 1000   CREATININE 1.15 12/07/2018 1000      Component Value Date/Time   CALCIUM 8.8 (L) 12/07/2018 1000   ALKPHOS 61 12/07/2018 1000   AST 27 12/07/2018 1000   ALT 23 12/07/2018 1000   BILITOT 0.9 12/07/2018 1000       [name] is a very pleasant 53 yo caucasian gentleman with metastatic adenocarcinoma of the GE junction, HER-2 positive.   Again, we will just go with maintenance therapy for him right now.  Hopefully, we will be able to add the [name] to the Herceptin.  I would like to try to hope that he will stay off systemic chemotherapy for a while.  I think this would just help him out.  I will plan to see him back in 1 month.  Hopefully, the [name] will be added when we see him back.   [name], MD 9/21/202011:23 AM

## 2018-12-28 ENCOUNTER — Inpatient Hospital Stay: Payer: 59 | Attending: Hematology & Oncology | Admitting: Hematology & Oncology

## 2018-12-28 ENCOUNTER — Inpatient Hospital Stay: Payer: 59

## 2018-12-28 ENCOUNTER — Telehealth: Payer: Self-pay | Admitting: Hematology & Oncology

## 2018-12-28 ENCOUNTER — Encounter: Payer: Self-pay | Admitting: Hematology & Oncology

## 2018-12-28 ENCOUNTER — Other Ambulatory Visit: Payer: Self-pay

## 2018-12-28 VITALS — BP 138/82 | HR 81 | Temp 97.3°F | Resp 17 | Ht 69.0 in | Wt 208.0 lb

## 2018-12-28 DIAGNOSIS — C160 Malignant neoplasm of cardia: Secondary | ICD-10-CM

## 2018-12-28 DIAGNOSIS — Z79899 Other long term (current) drug therapy: Secondary | ICD-10-CM | POA: Insufficient documentation

## 2018-12-28 DIAGNOSIS — G629 Polyneuropathy, unspecified: Secondary | ICD-10-CM | POA: Diagnosis not present

## 2018-12-28 DIAGNOSIS — Z5112 Encounter for antineoplastic immunotherapy: Secondary | ICD-10-CM | POA: Insufficient documentation

## 2018-12-28 DIAGNOSIS — Z95828 Presence of other vascular implants and grafts: Secondary | ICD-10-CM

## 2018-12-28 LAB — CBC WITH DIFFERENTIAL (CANCER CENTER ONLY)
Abs Immature Granulocytes: 0.03 10*3/uL (ref 0.00–0.07)
Basophils Absolute: 0 10*3/uL (ref 0.0–0.1)
Basophils Relative: 1 %
Eosinophils Absolute: 0.1 10*3/uL (ref 0.0–0.5)
Eosinophils Relative: 2 %
HCT: 46.5 % (ref 39.0–52.0)
Hemoglobin: 16 g/dL (ref 13.0–17.0)
Immature Granulocytes: 1 %
Lymphocytes Relative: 33 %
Lymphs Abs: 1.5 10*3/uL (ref 0.7–4.0)
MCH: 32.1 pg (ref 26.0–34.0)
MCHC: 34.4 g/dL (ref 30.0–36.0)
MCV: 93.2 fL (ref 80.0–100.0)
Monocytes Absolute: 0.3 10*3/uL (ref 0.1–1.0)
Monocytes Relative: 6 %
Neutro Abs: 2.8 10*3/uL (ref 1.7–7.7)
Neutrophils Relative %: 57 %
Platelet Count: 165 10*3/uL (ref 150–400)
RBC: 4.99 MIL/uL (ref 4.22–5.81)
RDW: 13.4 % (ref 11.5–15.5)
WBC Count: 4.7 10*3/uL (ref 4.0–10.5)
nRBC: 0 % (ref 0.0–0.2)

## 2018-12-28 LAB — CMP (CANCER CENTER ONLY)
ALT: 32 U/L (ref 0–44)
AST: 30 U/L (ref 15–41)
Albumin: 3.8 g/dL (ref 3.5–5.0)
Alkaline Phosphatase: 67 U/L (ref 38–126)
Anion gap: 8 (ref 5–15)
BUN: 13 mg/dL (ref 6–20)
CO2: 26 mmol/L (ref 22–32)
Calcium: 8.9 mg/dL (ref 8.9–10.3)
Chloride: 105 mmol/L (ref 98–111)
Creatinine: 1.06 mg/dL (ref 0.61–1.24)
GFR, Est AFR Am: >60 mL/min (ref >60)
GFR, Estimated: >60 mL/min (ref >60)
Glucose, Bld: 139 mg/dL — ABNORMAL HIGH (ref 70–99)
Potassium: 3.6 mmol/L (ref 3.5–5.1)
Sodium: 139 mmol/L (ref 135–145)
Total Bilirubin: 0.8 mg/dL (ref 0.3–1.2)
Total Protein: 6.4 g/dL — ABNORMAL LOW (ref 6.5–8.1)

## 2018-12-28 LAB — LACTATE DEHYDROGENASE: LDH: 160 U/L (ref 98–192)

## 2018-12-28 MED ORDER — DIPHENHYDRAMINE HCL 25 MG PO CAPS
ORAL_CAPSULE | ORAL | Status: AC
  Filled 2018-12-28: qty 2

## 2018-12-28 MED ORDER — ACETAMINOPHEN 325 MG PO TABS
ORAL_TABLET | ORAL | Status: AC
  Filled 2018-12-28: qty 2

## 2018-12-28 MED ORDER — SODIUM CHLORIDE 0.9% FLUSH
10.0000 mL | Freq: Once | INTRAVENOUS | Status: DC
  Filled 2018-12-28: qty 10

## 2018-12-28 MED ORDER — DEXILANT 60 MG PO CPDR: 60.0000 mg | DELAYED_RELEASE_CAPSULE | Freq: Every day | ORAL | 6 refills | Status: DC

## 2018-12-28 MED ORDER — HEPARIN SOD (PORK) LOCK FLUSH 100 UNIT/ML IV SOLN
500.0000 [IU] | Freq: Once | INTRAVENOUS | Status: AC
  Administered 2018-12-28: 500 [IU] via INTRAVENOUS
  Filled 2018-12-28: qty 5

## 2018-12-28 MED ORDER — SODIUM CHLORIDE 0.9% FLUSH
10.0000 mL | Freq: Once | INTRAVENOUS | Status: AC
  Administered 2018-12-28: 10 mL via INTRAVENOUS
  Filled 2018-12-28: qty 10

## 2018-12-28 NOTE — Patient Instructions (Addendum)
No treatment,Medication not available. Pt r/s for next week

## 2018-12-28 NOTE — Progress Notes (Signed)
Hematology and Oncology Follow Up Visit  Matthew Reynolds [id number] [birth year] 53 y.o. 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  He really looks fantastic.  So far, things are really going well for him.  He and his wife will be going to the beach later on this week for a couple days.  I know that they will have a good time.  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  He has been off the Xeloda.  This I think is helping him.  I really would like to try to get [name] added to his Herceptin for his GEJ tumor.  This is HER-2 positive.  I really believe that the combination of Herceptin/[name] will be helpful in his situation in the maintenance setting.  His last echocardiogram was done back in July.  His ejection fraction was 60-65%.  He has had no problems with dysphasia or odynophagia.  He has had no issues with bleeding.  There is no change in bowel or bladder habits.  He has had no cough.  There is no shortness of breath.  Overall, I would say [name].  Medications:  Allergies as of 12/28/2018   No Known Allergies     Medication List       Accurate as of December 28, 2018 12:09 PM. If you have any questions, ask your nurse or doctor.        cetirizine 10 MG tablet Commonly known as: ZYRTEC Take 10 mg by mouth daily.   famotidine 40 MG tablet Commonly known as: Pepcid Take 1 tablet (40 mg total) by mouth 2 (two) times daily.   gabapentin 300 MG capsule Commonly known as: NEURONTIN Take 1 capsule in the morning and 1 in the afternoon.  Take 2 at bedtime   lidocaine-prilocaine cream Commonly known as:  EMLA Apply 1 application topically as needed.   polyethylene glycol 17 g packet Commonly known as: MIRALAX / GLYCOLAX Take 17 g by mouth 2 (two) times daily.   pyridoxine 200 MG tablet Commonly known as: B-6 Take two tablets in the morning and one tablet at night.   Xeloda 500 MG tablet Generic drug: capecitabine TAKE 5 TABLETS BY MOUTH 2  TIMES DAILY AFTER A MEAL.  TAKE FOR 14 DAYS, THEN HOLD FOR 7 DAYS. REPEAT EVERY 21 DAYS.       Allergies: No Known Allergies  Past Medical History, Surgical history, Social history, and Family History were reviewed and updated.  Review of Systems: Review of Systems  Constitutional: Negative.   HENT: Negative.   Eyes: Negative.   Respiratory: Negative.   Cardiovascular: Negative.   Gastrointestinal: Negative.   Genitourinary: Negative.   Musculoskeletal: Negative.   Skin: Negative.   Neurological: Positive for tingling.  Endo/Heme/Allergies: Negative.   Psychiatric/Behavioral: Negative.    Marland Kitchen   Physical Exam:  vitals were not taken for this visit.   Wt Readings from Last 3 Encounters:  12/28/18 208 lb (94.3 kg)  12/07/18 208 lb (94.3 kg)  11/09/18 209 lb (94.8 kg)    Physical Exam Vitals signs reviewed.  HENT:     Head:  Normocephalic and atraumatic.  Eyes:     Pupils: Pupils are equal, round, and reactive to light.  Neck:     Musculoskeletal: Normal range of motion.  Cardiovascular:     Rate and Rhythm: Normal rate and regular rhythm.     Heart sounds: Normal heart sounds.  Pulmonary:     Effort: Pulmonary effort is normal.     Breath sounds: Normal breath sounds.  Abdominal:     General: Bowel sounds are normal.     Palpations: Abdomen is soft.  Musculoskeletal: Normal range of motion.        General: No tenderness or deformity.  Lymphadenopathy:     Cervical: No cervical adenopathy.  Skin:    General: Skin is warm and dry.     Findings: No erythema or rash.  Neurological:     Mental Status: He is alert and  oriented to person, place, and time.  Psychiatric:        Behavior: Behavior normal.        Thought Content: Thought content normal.        Judgment: Judgment normal.      Lab Results  Component Value Date   WBC 4.7 12/28/2018   HGB 16.0 12/28/2018   HCT 46.5 12/28/2018   MCV 93.2 12/28/2018   PLT 165 12/28/2018   Lab Results  Component Value Date   FERRITIN 92 07/14/2018   IRON 127 07/14/2018   TIBC 309 07/14/2018   UIBC 182 07/14/2018   IRONPCTSAT 41 07/14/2018   Lab Results  Component Value Date   RBC 4.99 12/28/2018   No results found for: KPAFRELGTCHN, LAMBDASER, KAPLAMBRATIO No results found for: IGGSERUM, IGA, IGMSERUM No results found for: Odetta Pink, SPEI   Chemistry      Component Value Date/Time   NA 139 12/28/2018 1038   K 3.6 12/28/2018 1038   CL 105 12/28/2018 1038   CO2 26 12/28/2018 1038   BUN 13 12/28/2018 1038   CREATININE 1.06 12/28/2018 1038      Component Value Date/Time   CALCIUM 8.9 12/28/2018 1038   ALKPHOS 67 12/28/2018 1038   AST 30 12/28/2018 1038   ALT 32 12/28/2018 1038   BILITOT 0.8 12/28/2018 1038       [name] is a very pleasant 53 yo caucasian gentleman with metastatic adenocarcinoma of the GE junction, HER-2 positive.   I will try to see if we cannot do the combination of Herceptin and [name] for his maintenance therapy for his GE junction tumor.  I do believe that this would be helpful.   We will try to do his treatments every 4 weeks.  I will plan to see him back in November.  I do not think we have we need to do another set of scans on him probably until early 2021.   [name], MD 10/12/202012:09 PM

## 2018-12-28 NOTE — Patient Instructions (Signed)

## 2018-12-28 NOTE — Telephone Encounter (Signed)
Appointments scheduled calendar declined due to my chart per 10/12 los

## 2019-01-04 ENCOUNTER — Inpatient Hospital Stay: Payer: 59

## 2019-01-04 ENCOUNTER — Other Ambulatory Visit: Payer: Self-pay

## 2019-01-04 VITALS — BP 142/86 | HR 88 | Temp 97.5°F | Resp 18

## 2019-01-04 DIAGNOSIS — C160 Malignant neoplasm of cardia: Secondary | ICD-10-CM | POA: Diagnosis not present

## 2019-01-04 LAB — CBC WITH DIFFERENTIAL (CANCER CENTER ONLY)
Abs Immature Granulocytes: 0.04 10*3/uL (ref 0.00–0.07)
Basophils Absolute: 0 10*3/uL (ref 0.0–0.1)
Basophils Relative: 1 %
Eosinophils Absolute: 0.1 10*3/uL (ref 0.0–0.5)
Eosinophils Relative: 2 %
HCT: 45.7 % (ref 39.0–52.0)
Hemoglobin: 15.7 g/dL (ref 13.0–17.0)
Immature Granulocytes: 1 %
Lymphocytes Relative: 34 %
Lymphs Abs: 1.3 10*3/uL (ref 0.7–4.0)
MCH: 31.7 pg (ref 26.0–34.0)
MCHC: 34.4 g/dL (ref 30.0–36.0)
MCV: 92.1 fL (ref 80.0–100.0)
Monocytes Absolute: 0.3 10*3/uL (ref 0.1–1.0)
Monocytes Relative: 9 %
Neutro Abs: 2 10*3/uL (ref 1.7–7.7)
Neutrophils Relative %: 53 %
Platelet Count: 144 10*3/uL — ABNORMAL LOW (ref 150–400)
RBC: 4.96 MIL/uL (ref 4.22–5.81)
RDW: 13 % (ref 11.5–15.5)
WBC Count: 3.8 10*3/uL — ABNORMAL LOW (ref 4.0–10.5)
nRBC: 0 % (ref 0.0–0.2)

## 2019-01-04 LAB — CMP (CANCER CENTER ONLY)
ALT: 53 U/L — ABNORMAL HIGH (ref 0–44)
AST: 58 U/L — ABNORMAL HIGH (ref 15–41)
Albumin: 3.9 g/dL (ref 3.5–5.0)
Alkaline Phosphatase: 84 U/L (ref 38–126)
Anion gap: 9 (ref 5–15)
BUN: 15 mg/dL (ref 6–20)
CO2: 26 mmol/L (ref 22–32)
Calcium: 8.8 mg/dL — ABNORMAL LOW (ref 8.9–10.3)
Chloride: 104 mmol/L (ref 98–111)
Creatinine: 1.03 mg/dL (ref 0.61–1.24)
GFR, Est AFR Am: >60 mL/min (ref >60)
GFR, Estimated: >60 mL/min (ref >60)
Glucose, Bld: 135 mg/dL — ABNORMAL HIGH (ref 70–99)
Potassium: 3.7 mmol/L (ref 3.5–5.1)
Sodium: 139 mmol/L (ref 135–145)
Total Bilirubin: 0.5 mg/dL (ref 0.3–1.2)
Total Protein: 6.3 g/dL — ABNORMAL LOW (ref 6.5–8.1)

## 2019-01-04 MED ORDER — ACETAMINOPHEN 325 MG PO TABS
650.0000 mg | ORAL_TABLET | Freq: Once | ORAL | Status: AC
  Administered 2019-01-04: 650 mg via ORAL

## 2019-01-04 MED ORDER — SODIUM CHLORIDE 0.9% FLUSH
10.0000 mL | INTRAVENOUS | Status: DC | PRN
  Administered 2019-01-04: 10 mL
  Filled 2019-01-04: qty 10

## 2019-01-04 MED ORDER — SODIUM CHLORIDE 0.9 % IV SOLN: 840.0000 mg | Freq: Once | INTRAVENOUS | Status: DC

## 2019-01-04 MED ORDER — SODIUM CHLORIDE 0.9 % IV SOLN
Freq: Once | INTRAVENOUS | Status: AC
  Administered 2019-01-04: 10:00:00 via INTRAVENOUS
  Filled 2019-01-04: qty 250

## 2019-01-04 MED ORDER — SODIUM CHLORIDE 0.9% FLUSH
10.0000 mL | Freq: Once | INTRAVENOUS | Status: AC
  Administered 2019-01-04: 10 mL
  Filled 2019-01-04: qty 10

## 2019-01-04 MED ORDER — HEPARIN SOD (PORK) LOCK FLUSH 100 UNIT/ML IV SOLN
500.0000 [IU] | Freq: Once | INTRAVENOUS | Status: AC | PRN
  Administered 2019-01-04: 500 [IU]
  Filled 2019-01-04: qty 5

## 2019-01-04 MED ORDER — TRASTUZUMAB-ANNS CHEMO 150 MG IV SOLR
750.0000 mg | Freq: Once | INTRAVENOUS | Status: AC
  Administered 2019-01-04: 750 mg via INTRAVENOUS
  Filled 2019-01-04: qty 35.72

## 2019-01-04 MED ORDER — DIPHENHYDRAMINE HCL 25 MG PO CAPS
50.0000 mg | ORAL_CAPSULE | Freq: Once | ORAL | Status: AC
  Administered 2019-01-04: 50 mg via ORAL

## 2019-01-04 NOTE — Patient Instructions (Signed)

## 2019-01-04 NOTE — Patient Instructions (Signed)
[facility] Discharge Instructions for Patients Receiving Chemotherapy Today you received the following chemotherapy agents:  Herceptin To help prevent nausea and vomiting after your treatment, we encourage you to take your nausea medication as prescribed.   If you develop nausea and vomiting that is not controlled by your nausea medication, call the clinic.   BELOW ARE SYMPTOMS THAT SHOULD BE REPORTED IMMEDIATELY:  *FEVER GREATER THAN 100.5 F  *CHILLS WITH OR WITHOUT FEVER  NAUSEA AND VOMITING THAT IS NOT CONTROLLED WITH YOUR NAUSEA MEDICATION  *UNUSUAL SHORTNESS OF BREATH  *UNUSUAL BRUISING OR BLEEDING  TENDERNESS IN MOUTH AND THROAT WITH OR WITHOUT PRESENCE OF ULCERS  *URINARY PROBLEMS  *BOWEL PROBLEMS  UNUSUAL RASH Items with * indicate a potential emergency and should be followed up as soon as possible.  Feel free to call the clinic should you have any questions or concerns. The clinic phone number is [phone].  Please show the CHEMO ALERT CARD at check-in to the Emergency Department and triage nurse.   

## 2019-01-04 NOTE — Progress Notes (Signed)
[name] is an off-label use for this indication. [name] will need to be rejected by his insurance before manufacturer will consider patient assistance. [name] and patient are aware. Matthew Reynolds will work on getting this in place before his next treatment if possible. Patient states understanding and that he was expecting this per his discussion with Dr.Ennever.

## 2019-01-13 ENCOUNTER — Other Ambulatory Visit: Payer: Self-pay

## 2019-01-13 DIAGNOSIS — Z20822 Contact with and (suspected) exposure to covid-19: Secondary | ICD-10-CM

## 2019-01-14 LAB — NOVEL CORONAVIRUS, NAA: SARS-CoV-2, NAA: NOT DETECTED

## 2019-01-18 ENCOUNTER — Ambulatory Visit: Payer: 59

## 2019-01-18 ENCOUNTER — Other Ambulatory Visit: Payer: 59

## 2019-01-18 ENCOUNTER — Ambulatory Visit: Payer: 59 | Admitting: Hematology & Oncology

## 2019-02-01 ENCOUNTER — Encounter: Payer: Self-pay | Admitting: Hematology & Oncology

## 2019-02-01 ENCOUNTER — Inpatient Hospital Stay (HOSPITAL_BASED_OUTPATIENT_CLINIC_OR_DEPARTMENT_OTHER): Payer: 59 | Admitting: Hematology & Oncology

## 2019-02-01 ENCOUNTER — Inpatient Hospital Stay: Payer: 59 | Attending: Hematology & Oncology

## 2019-02-01 ENCOUNTER — Encounter (HOSPITAL_COMMUNITY): Payer: Self-pay | Admitting: Hematology & Oncology

## 2019-02-01 ENCOUNTER — Inpatient Hospital Stay: Payer: 59

## 2019-02-01 ENCOUNTER — Other Ambulatory Visit: Payer: Self-pay

## 2019-02-01 ENCOUNTER — Other Ambulatory Visit: Payer: Self-pay | Admitting: Family

## 2019-02-01 VITALS — BP 141/90 | HR 86 | Temp 97.1°F | Resp 18 | Wt 210.0 lb

## 2019-02-01 DIAGNOSIS — Z5111 Encounter for antineoplastic chemotherapy: Secondary | ICD-10-CM

## 2019-02-01 DIAGNOSIS — C160 Malignant neoplasm of cardia: Secondary | ICD-10-CM

## 2019-02-01 DIAGNOSIS — Z5112 Encounter for antineoplastic immunotherapy: Secondary | ICD-10-CM | POA: Insufficient documentation

## 2019-02-01 DIAGNOSIS — I427 Cardiomyopathy due to drug and external agent: Secondary | ICD-10-CM

## 2019-02-01 DIAGNOSIS — C799 Secondary malignant neoplasm of unspecified site: Secondary | ICD-10-CM

## 2019-02-01 DIAGNOSIS — T451X5A Adverse effect of antineoplastic and immunosuppressive drugs, initial encounter: Secondary | ICD-10-CM

## 2019-02-01 DIAGNOSIS — D500 Iron deficiency anemia secondary to blood loss (chronic): Secondary | ICD-10-CM

## 2019-02-01 DIAGNOSIS — Z79899 Other long term (current) drug therapy: Secondary | ICD-10-CM | POA: Insufficient documentation

## 2019-02-01 LAB — CMP (CANCER CENTER ONLY)
ALT: 42 U/L (ref 0–44)
AST: 32 U/L (ref 15–41)
Albumin: 4 g/dL (ref 3.5–5.0)
Alkaline Phosphatase: 82 U/L (ref 38–126)
Anion gap: 9 (ref 5–15)
BUN: 16 mg/dL (ref 6–20)
CO2: 26 mmol/L (ref 22–32)
Calcium: 8.9 mg/dL (ref 8.9–10.3)
Chloride: 105 mmol/L (ref 98–111)
Creatinine: 1.06 mg/dL (ref 0.61–1.24)
GFR, Est AFR Am: >60 mL/min (ref >60)
GFR, Estimated: >60 mL/min (ref >60)
Glucose, Bld: 129 mg/dL — ABNORMAL HIGH (ref 70–99)
Potassium: 3.8 mmol/L (ref 3.5–5.1)
Sodium: 140 mmol/L (ref 135–145)
Total Bilirubin: 0.7 mg/dL (ref 0.3–1.2)
Total Protein: 6.3 g/dL — ABNORMAL LOW (ref 6.5–8.1)

## 2019-02-01 LAB — CBC WITH DIFFERENTIAL (CANCER CENTER ONLY)
Abs Immature Granulocytes: 0.03 10*3/uL (ref 0.00–0.07)
Basophils Absolute: 0 10*3/uL (ref 0.0–0.1)
Basophils Relative: 0 %
Eosinophils Absolute: 0.1 10*3/uL (ref 0.0–0.5)
Eosinophils Relative: 1 %
HCT: 48.3 % (ref 39.0–52.0)
Hemoglobin: 16.3 g/dL (ref 13.0–17.0)
Immature Granulocytes: 0 %
Lymphocytes Relative: 21 %
Lymphs Abs: 1.4 10*3/uL (ref 0.7–4.0)
MCH: 30.1 pg (ref 26.0–34.0)
MCHC: 33.7 g/dL (ref 30.0–36.0)
MCV: 89.3 fL (ref 80.0–100.0)
Monocytes Absolute: 0.4 10*3/uL (ref 0.1–1.0)
Monocytes Relative: 6 %
Neutro Abs: 5 10*3/uL (ref 1.7–7.7)
Neutrophils Relative %: 72 %
Platelet Count: 166 10*3/uL (ref 150–400)
RBC: 5.41 MIL/uL (ref 4.22–5.81)
RDW: 12.1 % (ref 11.5–15.5)
WBC Count: 7 10*3/uL (ref 4.0–10.5)
nRBC: 0 % (ref 0.0–0.2)

## 2019-02-01 MED ORDER — DIPHENHYDRAMINE HCL 25 MG PO CAPS
ORAL_CAPSULE | ORAL | Status: AC
  Filled 2019-02-01: qty 2

## 2019-02-01 MED ORDER — DIPHENHYDRAMINE HCL 25 MG PO CAPS
50.0000 mg | ORAL_CAPSULE | Freq: Once | ORAL | Status: AC
  Administered 2019-02-01: 50 mg via ORAL

## 2019-02-01 MED ORDER — ACETAMINOPHEN 325 MG PO TABS
650.0000 mg | ORAL_TABLET | Freq: Once | ORAL | Status: AC
  Administered 2019-02-01: 650 mg via ORAL

## 2019-02-01 MED ORDER — TRASTUZUMAB-ANNS CHEMO 150 MG IV SOLR
750.0000 mg | Freq: Once | INTRAVENOUS | Status: AC
  Administered 2019-02-01: 750 mg via INTRAVENOUS
  Filled 2019-02-01: qty 35.72

## 2019-02-01 MED ORDER — HEPARIN SOD (PORK) LOCK FLUSH 100 UNIT/ML IV SOLN
500.0000 [IU] | Freq: Once | INTRAVENOUS | Status: DC | PRN
  Filled 2019-02-01: qty 5

## 2019-02-01 MED ORDER — SODIUM CHLORIDE 0.9% FLUSH
10.0000 mL | INTRAVENOUS | Status: DC | PRN
  Filled 2019-02-01: qty 10

## 2019-02-01 MED ORDER — ACETAMINOPHEN 325 MG PO TABS
ORAL_TABLET | ORAL | Status: AC
  Filled 2019-02-01: qty 2

## 2019-02-01 MED ORDER — SODIUM CHLORIDE 0.9 % IV SOLN
Freq: Once | INTRAVENOUS | Status: AC
  Administered 2019-02-01: 10:00:00 via INTRAVENOUS
  Filled 2019-02-01: qty 250

## 2019-02-01 MED ORDER — SODIUM CHLORIDE 0.9% FLUSH
10.0000 mL | Freq: Once | INTRAVENOUS | Status: AC | PRN
  Administered 2019-02-01: 10 mL
  Filled 2019-02-01: qty 10

## 2019-02-01 NOTE — Patient Instructions (Signed)

## 2019-02-01 NOTE — Progress Notes (Signed)
Hematology and Oncology Follow Up Visit  Matthew Reynolds [id number] [birth year] 53 y.o. 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  He really looks fantastic.  So far, things are really going well for him.  He really has had no problems at all.  He has been no issues with nausea or vomiting.  He has had no pain.  He has had no swallowing difficulties.  There has been no bleeding.  He has had no change in bowel or bladder habits.  We are on maintenance therapy now with Herceptin.  So far, the insurance company would not let us add the [name] as of yet.  He has had no issues with respect to his heart.  His last echocardiogram was done I think back in July.  Badik another one set up on him.  There is been no fever.  He has had no cough.   Overall, I would say [name].  Medications:  Allergies as of 02/01/2019   No Known Allergies     Medication List       Accurate as of February 01, 2019  9:58 AM. If you have any questions, ask your nurse or doctor.        cetirizine 10 MG tablet Commonly known as: ZYRTEC Take 10 mg by mouth daily.   Dexilant 60 MG capsule Generic drug: dexlansoprazole Take 1 capsule (60 mg total) by mouth daily.   gabapentin 300 MG capsule Commonly known as: NEURONTIN Take 1 capsule in the morning and 1 in the afternoon.  Take 2 at bedtime   lidocaine-prilocaine cream Commonly known as: EMLA Apply 1 application topically as needed.   polyethylene glycol 17 g packet Commonly known as: MIRALAX / GLYCOLAX Take 17 g by mouth 2 (two) times daily.   pyridoxine 200 MG tablet Commonly known as: B-6 Take two tablets in the morning  and one tablet at night.   Xeloda 500 MG tablet Generic drug: capecitabine TAKE 5 TABLETS BY MOUTH 2  TIMES DAILY AFTER A MEAL.  TAKE FOR 14 DAYS, THEN HOLD FOR 7 DAYS. REPEAT EVERY 21 DAYS.       Allergies: No Known Allergies  Past Medical History, Surgical history, Social history, and Family History were reviewed and updated.  Review of Systems: Review of Systems  Constitutional: Negative.   HENT: Negative.   Eyes: Negative.   Respiratory: Negative.   Cardiovascular: Negative.   Gastrointestinal: Negative.   Genitourinary: Negative.   Musculoskeletal: Negative.   Skin: Negative.   Neurological: Positive for tingling.  Endo/Heme/Allergies: Negative.   Psychiatric/Behavioral: Negative.    Marland Kitchen   Physical Exam:  weight is 210 lb (95.3 kg). His temporal temperature is 97.1 F (36.2 C) (abnormal). His blood pressure is 141/90 (abnormal) and his pulse is 86. His respiration is 18 and oxygen saturation is 97%.   Wt Readings from Last 3 Encounters:  02/01/19 210 lb (95.3 kg)  12/28/18 208 lb (94.3 kg)  12/07/18 208 lb (94.3 kg)    Physical Exam Vitals signs reviewed.  HENT:     Head: Normocephalic and atraumatic.  Eyes:     Pupils: Pupils are equal,  round, and reactive to light.  Neck:     Musculoskeletal: Normal range of motion.  Cardiovascular:     Rate and Rhythm: Normal rate and regular rhythm.     Heart sounds: Normal heart sounds.  Pulmonary:     Effort: Pulmonary effort is normal.     Breath sounds: Normal breath sounds.  Abdominal:     General: Bowel sounds are normal.     Palpations: Abdomen is soft.  Musculoskeletal: Normal range of motion.        General: No tenderness or deformity.  Lymphadenopathy:     Cervical: No cervical adenopathy.  Skin:    General: Skin is warm and dry.     Findings: No erythema or rash.  Neurological:     Mental Status: He is alert and oriented to person, place, and time.  Psychiatric:        Behavior: Behavior normal.         Thought Content: Thought content normal.        Judgment: Judgment normal.      Lab Results  Component Value Date   WBC 7.0 02/01/2019   HGB 16.3 02/01/2019   HCT 48.3 02/01/2019   MCV 89.3 02/01/2019   PLT 166 02/01/2019   Lab Results  Component Value Date   FERRITIN 92 07/14/2018   IRON 127 07/14/2018   TIBC 309 07/14/2018   UIBC 182 07/14/2018   IRONPCTSAT 41 07/14/2018   Lab Results  Component Value Date   RBC 5.41 02/01/2019   No results found for: KPAFRELGTCHN, LAMBDASER, KAPLAMBRATIO No results found for: IGGSERUM, IGA, IGMSERUM No results found for: Odetta Pink, SPEI   Chemistry      Component Value Date/Time   NA 140 02/01/2019 0845   K 3.8 02/01/2019 0845   CL 105 02/01/2019 0845   CO2 26 02/01/2019 0845   BUN 16 02/01/2019 0845   CREATININE 1.06 02/01/2019 0845      Component Value Date/Time   CALCIUM 8.9 02/01/2019 0845   ALKPHOS 82 02/01/2019 0845   AST 32 02/01/2019 0845   ALT 42 02/01/2019 0845   BILITOT 0.7 02/01/2019 0845       [name] is a very pleasant 53 yo caucasian gentleman with metastatic adenocarcinoma of the GE junction, HER-2 positive.   We will go ahead and keep him on the Herceptin for right now.  I think this would be reasonable for maintenance therapy.  I just do not want to see him have toxicity from continued chemotherapy.  We probably will have to try to get another PET scan on him in December.  I think this would be reasonable.  I will plan to see him back in another 4 weeks.   [name], MD 11/16/20209:58 AM

## 2019-02-09 ENCOUNTER — Other Ambulatory Visit: Payer: Self-pay

## 2019-02-09 ENCOUNTER — Ambulatory Visit (HOSPITAL_COMMUNITY): Payer: 59 | Attending: Cardiology

## 2019-02-09 DIAGNOSIS — C160 Malignant neoplasm of cardia: Secondary | ICD-10-CM | POA: Diagnosis present

## 2019-02-10 ENCOUNTER — Telehealth: Payer: Self-pay | Admitting: *Deleted

## 2019-02-10 NOTE — Telephone Encounter (Signed)
-----   Message from [name], MD sent at 02/10/2019  7:00 AM EST ----- Call - your heart is pumping like a champ!!!  Matthew Reynolds

## 2019-02-10 NOTE — Telephone Encounter (Signed)
Called pt with results. No concerns at this time.

## 2019-02-17 ENCOUNTER — Other Ambulatory Visit: Payer: Self-pay | Admitting: Hematology & Oncology

## 2019-02-17 DIAGNOSIS — C160 Malignant neoplasm of cardia: Secondary | ICD-10-CM

## 2019-02-26 ENCOUNTER — Ambulatory Visit (HOSPITAL_COMMUNITY)
Admission: RE | Admit: 2019-02-26 | Discharge: 2019-02-26 | Disposition: A | Discharge status: Home / Self Care | Payer: 59 | Source: Ambulatory Visit | Attending: Hematology & Oncology | Admitting: Hematology & Oncology

## 2019-02-26 ENCOUNTER — Other Ambulatory Visit: Payer: Self-pay

## 2019-02-26 DIAGNOSIS — C160 Malignant neoplasm of cardia: Secondary | ICD-10-CM | POA: Insufficient documentation

## 2019-02-26 LAB — GLUCOSE, CAPILLARY: Glucose-Capillary: 104 mg/dL — ABNORMAL HIGH (ref 70–99)

## 2019-02-26 MED ORDER — FLUDEOXYGLUCOSE F - 18 (FDG) INJECTION
10.1000 | Freq: Once | INTRAVENOUS | Status: AC
  Administered 2019-02-26: 10.1 via INTRAVENOUS

## 2019-03-01 ENCOUNTER — Other Ambulatory Visit: Payer: Self-pay

## 2019-03-01 ENCOUNTER — Inpatient Hospital Stay (HOSPITAL_BASED_OUTPATIENT_CLINIC_OR_DEPARTMENT_OTHER): Payer: 59 | Admitting: Hematology & Oncology

## 2019-03-01 ENCOUNTER — Encounter: Payer: Self-pay | Admitting: Hematology & Oncology

## 2019-03-01 ENCOUNTER — Other Ambulatory Visit: Payer: Self-pay | Admitting: *Deleted

## 2019-03-01 ENCOUNTER — Inpatient Hospital Stay: Payer: 59

## 2019-03-01 ENCOUNTER — Telehealth: Payer: Self-pay | Admitting: *Deleted

## 2019-03-01 ENCOUNTER — Inpatient Hospital Stay: Payer: 59 | Attending: Hematology & Oncology

## 2019-03-01 VITALS — BP 151/101 | HR 93 | Temp 98.9°F | Resp 18 | Wt 205.0 lb

## 2019-03-01 VITALS — BP 139/87 | HR 82

## 2019-03-01 DIAGNOSIS — Z5111 Encounter for antineoplastic chemotherapy: Secondary | ICD-10-CM | POA: Insufficient documentation

## 2019-03-01 DIAGNOSIS — Z79899 Other long term (current) drug therapy: Secondary | ICD-10-CM | POA: Diagnosis not present

## 2019-03-01 DIAGNOSIS — C7889 Secondary malignant neoplasm of other digestive organs: Secondary | ICD-10-CM | POA: Diagnosis not present

## 2019-03-01 DIAGNOSIS — R1310 Dysphagia, unspecified: Secondary | ICD-10-CM | POA: Diagnosis not present

## 2019-03-01 DIAGNOSIS — Z5112 Encounter for antineoplastic immunotherapy: Secondary | ICD-10-CM | POA: Insufficient documentation

## 2019-03-01 DIAGNOSIS — C160 Malignant neoplasm of cardia: Secondary | ICD-10-CM | POA: Insufficient documentation

## 2019-03-01 LAB — CBC WITH DIFFERENTIAL (CANCER CENTER ONLY)
Abs Immature Granulocytes: 0.01 10*3/uL (ref 0.00–0.07)
Basophils Absolute: 0 10*3/uL (ref 0.0–0.1)
Basophils Relative: 1 %
Eosinophils Absolute: 0 10*3/uL (ref 0.0–0.5)
Eosinophils Relative: 1 %
HCT: 47.7 % (ref 39.0–52.0)
Hemoglobin: 16.1 g/dL (ref 13.0–17.0)
Immature Granulocytes: 0 %
Lymphocytes Relative: 20 %
Lymphs Abs: 1 10*3/uL (ref 0.7–4.0)
MCH: 29.2 pg (ref 26.0–34.0)
MCHC: 33.8 g/dL (ref 30.0–36.0)
MCV: 86.6 fL (ref 80.0–100.0)
Monocytes Absolute: 0.3 10*3/uL (ref 0.1–1.0)
Monocytes Relative: 6 %
Neutro Abs: 3.6 10*3/uL (ref 1.7–7.7)
Neutrophils Relative %: 72 %
Platelet Count: 176 10*3/uL (ref 150–400)
RBC: 5.51 MIL/uL (ref 4.22–5.81)
RDW: 11.8 % (ref 11.5–15.5)
WBC Count: 4.9 10*3/uL (ref 4.0–10.5)
nRBC: 0 % (ref 0.0–0.2)

## 2019-03-01 LAB — CMP (CANCER CENTER ONLY)
ALT: 25 U/L (ref 0–44)
AST: 21 U/L (ref 15–41)
Albumin: 4.2 g/dL (ref 3.5–5.0)
Alkaline Phosphatase: 85 U/L (ref 38–126)
Anion gap: 7 (ref 5–15)
BUN: 11 mg/dL (ref 6–20)
CO2: 27 mmol/L (ref 22–32)
Calcium: 9.1 mg/dL (ref 8.9–10.3)
Chloride: 105 mmol/L (ref 98–111)
Creatinine: 1.06 mg/dL (ref 0.61–1.24)
GFR, Est AFR Am: >60 mL/min (ref >60)
GFR, Estimated: >60 mL/min (ref >60)
Glucose, Bld: 153 mg/dL — ABNORMAL HIGH (ref 70–99)
Potassium: 3.8 mmol/L (ref 3.5–5.1)
Sodium: 139 mmol/L (ref 135–145)
Total Bilirubin: 0.6 mg/dL (ref 0.3–1.2)
Total Protein: 6.7 g/dL (ref 6.5–8.1)

## 2019-03-01 MED ORDER — SODIUM CHLORIDE 0.9% FLUSH
10.0000 mL | INTRAVENOUS | Status: DC | PRN
  Administered 2019-03-01: 10 mL
  Filled 2019-03-01: qty 10

## 2019-03-01 MED ORDER — DIPHENHYDRAMINE HCL 25 MG PO CAPS: 50.0000 mg | ORAL_CAPSULE | Freq: Once | ORAL | Status: DC

## 2019-03-01 MED ORDER — ACETAMINOPHEN 325 MG PO TABS
ORAL_TABLET | ORAL | Status: AC
  Filled 2019-03-01: qty 2

## 2019-03-01 MED ORDER — HEPARIN SOD (PORK) LOCK FLUSH 100 UNIT/ML IV SOLN
500.0000 [IU] | Freq: Once | INTRAVENOUS | Status: AC | PRN
  Administered 2019-03-01: 500 [IU]
  Filled 2019-03-01: qty 5

## 2019-03-01 MED ORDER — TRASTUZUMAB-ANNS CHEMO 150 MG IV SOLR
750.0000 mg | Freq: Once | INTRAVENOUS | Status: AC
  Administered 2019-03-01: 750 mg via INTRAVENOUS
  Filled 2019-03-01: qty 35.72

## 2019-03-01 MED ORDER — SODIUM CHLORIDE 0.9 % IV SOLN
Freq: Once | INTRAVENOUS | Status: AC
  Administered 2019-03-01: 11:00:00 via INTRAVENOUS
  Filled 2019-03-01: qty 250

## 2019-03-01 MED ORDER — ACETAMINOPHEN 325 MG PO TABS
650.0000 mg | ORAL_TABLET | Freq: Once | ORAL | Status: AC
  Administered 2019-03-01: 650 mg via ORAL

## 2019-03-01 NOTE — Progress Notes (Signed)
PET scan results, CBC, CMET and Dr. Antonieta Pert office note faxed to Dr. Frazier Butt office per order of [name].

## 2019-03-01 NOTE — Patient Instructions (Addendum)
Trastuzumab injection for infusion What is this medicine? TRASTUZUMAB (tras TOO zoo mab) is a monoclonal antibody. It is used to treat breast cancer and stomach cancer. This medicine may be used for other purposes; ask your health care provider or pharmacist if you have questions. COMMON BRAND NAME(S): Herceptin, Herzuma, KANJINTI, Ogivri, Ontruzant, Trazimera What should I tell my health care provider before I take this medicine? They need to know if you have any of these conditions:  heart disease  heart failure  lung or breathing disease, like asthma  an unusual or allergic reaction to trastuzumab, benzyl alcohol, or other medications, foods, dyes, or preservatives  pregnant or trying to get pregnant  breast-feeding How should I use this medicine? This drug is given as an infusion into a vein. It is administered in a hospital or clinic by a specially trained health care professional. Talk to your pediatrician regarding the use of this medicine in children. This medicine is not approved for use in children. Overdosage: If you think you have taken too much of this medicine contact a poison control center or emergency room at once. NOTE: This medicine is only for you. Do not share this medicine with others. What if I miss a dose? It is important not to miss a dose. Call your doctor or health care professional if you are unable to keep an appointment. What may interact with this medicine? This medicine may interact with the following medications:  certain types of chemotherapy, such as daunorubicin, doxorubicin, epirubicin, and idarubicin This list may not describe all possible interactions. Give your health care provider a list of all the medicines, herbs, non-prescription drugs, or dietary supplements you use. Also tell them if you smoke, drink alcohol, or use illegal drugs. Some items may interact with your medicine. What should I watch for while using this medicine? Visit your  doctor for checks on your progress. Report any side effects. Continue your course of treatment even though you feel ill unless your doctor tells you to stop. Call your doctor or health care professional for advice if you get a fever, chills or sore throat, or other symptoms of a cold or flu. Do not treat yourself. Try to avoid being around people who are sick. You may experience fever, chills and shaking during your first infusion. These effects are usually mild and can be treated with other medicines. Report any side effects during the infusion to your health care professional. Fever and chills usually do not happen with later infusions. Do not become pregnant while taking this medicine or for 7 months after stopping it. Women should inform their doctor if they wish to become pregnant or think they might be pregnant. Women of child-bearing potential will need to have a negative pregnancy test before starting this medicine. There is a potential for serious side effects to an unborn child. Talk to your health care professional or pharmacist for more information. Do not breast-feed an infant while taking this medicine or for 7 months after stopping it. Women must use effective birth control with this medicine. What side effects may I notice from receiving this medicine? Side effects that you should report to your doctor or health care professional as soon as possible:  allergic reactions like skin rash, itching or hives, swelling of the face, lips, or tongue  chest pain or palpitations  cough  dizziness  feeling faint or lightheaded, falls  fever  general ill feeling or flu-like symptoms  signs of worsening heart failure like   breathing problems; swelling in your legs and feet  unusually weak or tired Side effects that usually do not require medical attention (report to your doctor or health care professional if they continue or are bothersome):  bone pain  changes in  taste  diarrhea  joint pain  nausea/vomiting  weight loss This list may not describe all possible side effects. Call your doctor for medical advice about side effects. You may report side effects to FDA at 1-800-FDA-1088. Where should I keep my medicine? This drug is given in a hospital or clinic and will not be stored at home. NOTE: This sheet is a summary. It may not cover all possible information. If you have questions about this medicine, talk to your doctor, pharmacist, or health care provider.  2020 Elsevier/Gold Standard (2016-02-27 14:37:52)  

## 2019-03-01 NOTE — Telephone Encounter (Signed)
Call received from [place] with [facility] to inform [name] that PET scan results are available from 02/26/19.  PET scan printed and given to [name].

## 2019-03-01 NOTE — Patient Instructions (Signed)

## 2019-03-01 NOTE — Progress Notes (Signed)
Hematology and Oncology Follow Up Visit  Matthew Reynolds [id number] [birth year] 53 y.o. 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  Unfortunately, I think we may have an issue.  He began to complain of little bit of dysphagia a couple weeks ago.  We went ahead and got a PET scan on him.  The PET scan seem to show activity in the lower esophagus.  The SUV was 8.9.  The activity extends along the lesser curvature of the stomach.  There was no activity elsewhere.  I suspect that this might be a recurrence.  I have already spoken to his gastroenterologist, Dr. Collene Mares.  She will do an upper endoscopy on him and get biopsies for [place].  Otherwise, he says he is doing a little bit better with swallowing.  There is no bleeding.  He has had no pain.  He has had no fatigue.  He still walking and exercising.  He has had no cough.  There is no headache.  Overall, [name].  Medications:  Allergies as of 03/01/2019   No Known Allergies     Medication List       Accurate as of March 01, 2019 10:50 AM. If you have any questions, ask your nurse or doctor.        cetirizine 10 MG tablet Commonly known as: ZYRTEC Take 10 mg by mouth daily.   Dexilant 60 MG capsule Generic drug: dexlansoprazole Take 1 capsule (60 mg total) by mouth daily.   gabapentin 300 MG capsule Commonly known as: NEURONTIN Take 1 capsule in the morning and 1 in the afternoon.  Take 2 at bedtime   lidocaine-prilocaine cream Commonly known as: EMLA Apply 1 application topically as needed.   MULTI-VITAMIN DAILY PO Take by mouth.   polyethylene glycol 17 g packet Commonly known as: MIRALAX /  GLYCOLAX Take 17 g by mouth 2 (two) times daily.   pyridoxine 200 MG tablet Commonly known as: B-6 Take two tablets in the morning and one tablet at night.   Xeloda 500 MG tablet Generic drug: capecitabine TAKE 5 TABLETS BY MOUTH 2  TIMES DAILY AFTER A MEAL.  TAKE FOR 14 DAYS, THEN HOLD FOR 7 DAYS. REPEAT EVERY 21 DAYS.       Allergies: No Known Allergies  Past Medical History, Surgical history, Social history, and Family History were reviewed and updated.  Review of Systems: Review of Systems  Constitutional: Negative.   HENT: Negative.   Eyes: Negative.   Respiratory: Negative.   Cardiovascular: Negative.   Gastrointestinal: Negative.   Genitourinary: Negative.   Musculoskeletal: Negative.   Skin: Negative.   Neurological: Positive for tingling.  Endo/Heme/Allergies: Negative.   Psychiatric/Behavioral: Negative.    Matthew Reynolds   Physical Exam:  weight is 205 lb (93 kg). His temporal temperature is 98.9 F (37.2 C). His blood pressure is 151/101 (abnormal) and his pulse is 93. His respiration is 18 and oxygen saturation is 98%.   Wt Readings from Last 3 Encounters:  03/01/19 205 lb (93 kg)  02/01/19 210 lb (95.3 kg)  12/28/18 208 lb (94.3 kg)    Physical Exam Vitals reviewed.  HENT:  Head: Normocephalic and atraumatic.  Eyes:     Pupils: Pupils are equal, round, and reactive to light.  Cardiovascular:     Rate and Rhythm: Normal rate and regular rhythm.     Heart sounds: Normal heart sounds.  Pulmonary:     Effort: Pulmonary effort is normal.     Breath sounds: Normal breath sounds.  Abdominal:     General: Bowel sounds are normal.     Palpations: Abdomen is soft.  Musculoskeletal:        General: No tenderness or deformity. Normal range of motion.     Cervical back: Normal range of motion.  Lymphadenopathy:     Cervical: No cervical adenopathy.  Skin:    General: Skin is warm and dry.     Findings: No erythema or rash.  Neurological:     Mental Status:  He is alert and oriented to person, place, and time.  Psychiatric:        Behavior: Behavior normal.        Thought Content: Thought content normal.        Judgment: Judgment normal.      Lab Results  Component Value Date   WBC 4.9 03/01/2019   HGB 16.1 03/01/2019   HCT 47.7 03/01/2019   MCV 86.6 03/01/2019   PLT 176 03/01/2019   Lab Results  Component Value Date   FERRITIN 92 07/14/2018   IRON 127 07/14/2018   TIBC 309 07/14/2018   UIBC 182 07/14/2018   IRONPCTSAT 41 07/14/2018   Lab Results  Component Value Date   RBC 5.51 03/01/2019   No results found for: KPAFRELGTCHN, LAMBDASER, KAPLAMBRATIO No results found for: IGGSERUM, IGA, IGMSERUM No results found for: Odetta Pink, SPEI   Chemistry      Component Value Date/Time   NA 140 02/01/2019 0845   K 3.8 02/01/2019 0845   CL 105 02/01/2019 0845   CO2 26 02/01/2019 0845   BUN 16 02/01/2019 0845   CREATININE 1.06 02/01/2019 0845      Component Value Date/Time   CALCIUM 8.9 02/01/2019 0845   ALKPHOS 82 02/01/2019 0845   AST 32 02/01/2019 0845   ALT 42 02/01/2019 0845   BILITOT 0.7 02/01/2019 0845       Impression and Plan: [name] is a very pleasant 53 yo caucasian gentleman with metastatic adenocarcinoma of the GE junction, HER-2 positive.   Now, we may have a problem with respect to recurrence.  Looks like a local recurrence.  The biopsies via upper endoscopy will show [place].  If this is malignant, we will send off molecular markers.  I have already spoken with Dr. Sondra Come of Radiation Oncology.  I think that since there is no evidence of disease elsewhere, I would favor aggressive radiation and chemotherapy.  I probably would consider cis-platinum with infusional 5-FU since he did so well with this.  I spent about 40 minutes with Mr. Mizer.  I am somewhat surprised by the recurrence.  He has done very well just on maintenance Herceptin.  It would not  surprise me I guess if the recurrence is HER-2 negative.  We will continue him on the Herceptin for right now.  We will try to get his upper endoscopy and biopsies this week.  Maybe, we can get things moving along and get him started on radiation chemotherapy, if he does have recurrent malignancy in 1-2 weeks at the latest.  I will plan to have him  come back to see me in another month.     [name], MD 12/14/202010:50 AM

## 2019-03-03 ENCOUNTER — Ambulatory Visit (HOSPITAL_COMMUNITY): Payer: 59

## 2019-03-04 ENCOUNTER — Other Ambulatory Visit: Payer: Self-pay

## 2019-03-04 ENCOUNTER — Inpatient Hospital Stay (HOSPITAL_BASED_OUTPATIENT_CLINIC_OR_DEPARTMENT_OTHER): Payer: 59 | Admitting: Hematology & Oncology

## 2019-03-04 ENCOUNTER — Encounter: Payer: Self-pay | Admitting: Hematology & Oncology

## 2019-03-04 VITALS — BP 173/97 | HR 76 | Temp 96.9°F | Resp 18 | Wt 202.0 lb

## 2019-03-04 DIAGNOSIS — Z5112 Encounter for antineoplastic immunotherapy: Secondary | ICD-10-CM | POA: Diagnosis not present

## 2019-03-04 DIAGNOSIS — C160 Malignant neoplasm of cardia: Secondary | ICD-10-CM

## 2019-03-04 NOTE — Addendum Note (Signed)
Addended by: Burney Gauze R on: 03/04/2019 09:19 AM   Modules accepted: Orders

## 2019-03-04 NOTE — Progress Notes (Signed)
Hematology and Oncology Follow Up Visit  Matthew Reynolds [id number] Jan 02, 1966 53 y.o. 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  I do think that we have evidence of recurrent disease.  He was seen by Dr. Collene Mares on 03/03/2019.  She did an upper endoscopy on him.  This showed a large friable 10 cm salvageable mass extending from 33-44 cm.  There is the mass at the cardia and in the gastric fundus extending to the lesser curvature.  This was biopsied.  I do not have the results back yet.  There was some bleeding.  The mass was starting to cause significant obstruction.  He is still eating fairly well.  He is not having dysphagia or odynophagia as of yet.  I have already spoken to Dr. Sondra Come of Radiation Oncology.  He will actually see Matthew Reynolds tomorrow.  I think that we need to get treatment started pretty quickly because of the mass that was somewhat friable and I do not want to see him start to bleed.  When he first presented with this problem, he had marked anemia from bleeding.  I think that radiation and chemotherapy would be a great way of trying to treat this.  Since this does appear to be a localized recurrence, I think that aggressive intervention would be reasonable.  He is young.  He is healthy.  I think he can handle aggressive therapy.  I would use cis-platinum along with 5-FU infusion.  I think this would be a good radiosensitizer for the radiation therapy.  He already has a Port-A-Cath and so we do not have to worry about the infusional aspect of treatment.  Thankfully, he he came in with his wife.  We  were able to get his wife in to be with him.  She really helps out quite a bit.  He has had no melena or bright red blood per rectum.  Currently, [name].   Medications:  Allergies as of 03/04/2019   No Known Allergies     Medication List       Accurate as of March 04, 2019  4:31 PM. If you have any questions, ask your nurse or doctor.        STOP taking these medications   cetirizine 10 MG tablet Commonly known as: ZYRTEC Stopped by: [name], MD   gabapentin 300 MG capsule Commonly known as: NEURONTIN Stopped by: [name], MD   polyethylene glycol 17 g packet Commonly known as: MIRALAX / GLYCOLAX Stopped by: [name], MD   pyridoxine 200 MG tablet Commonly known as: B-6 Stopped by: [name], MD   Xeloda 500 MG tablet Generic drug: capecitabine Stopped by: [name], MD     TAKE these medications   Dexilant 60 MG capsule Generic drug: dexlansoprazole Take 1 capsule (60 mg total) by mouth daily.   lidocaine-prilocaine cream Commonly known as: EMLA Apply 1 application topically as needed.   MULTI-VITAMIN DAILY PO Take by mouth.       Allergies: No Known Allergies  Past Medical  History, Surgical history, Social history, and Family History were reviewed and updated.  Review of Systems: Review of Systems  Constitutional: Negative.   HENT: Negative.   Eyes: Negative.   Respiratory: Negative.   Cardiovascular: Negative.   Gastrointestinal: Negative.   Genitourinary: Negative.   Musculoskeletal: Negative.   Skin: Negative.   Neurological: Positive for tingling.  Endo/Heme/Allergies: Negative.   Psychiatric/Behavioral: Negative.    Marland Kitchen   Physical Exam:  weight is 202 lb (91.6 kg). His temporal temperature is 96.9 F (36.1 C) (abnormal). His blood pressure is 173/97 (abnormal) and his pulse is 76. His respiration is 18 and oxygen saturation is 99%.   Wt Readings from Last 3 Encounters:   03/04/19 202 lb (91.6 kg)  03/01/19 205 lb (93 kg)  02/01/19 210 lb (95.3 kg)    Physical Exam Vitals reviewed.  HENT:     Head: Normocephalic and atraumatic.  Eyes:     Pupils: Pupils are equal, round, and reactive to light.  Cardiovascular:     Rate and Rhythm: Normal rate and regular rhythm.     Heart sounds: Normal heart sounds.  Pulmonary:     Effort: Pulmonary effort is normal.     Breath sounds: Normal breath sounds.  Abdominal:     General: Bowel sounds are normal.     Palpations: Abdomen is soft.  Musculoskeletal:        General: No tenderness or deformity. Normal range of motion.     Cervical back: Normal range of motion.  Lymphadenopathy:     Cervical: No cervical adenopathy.  Skin:    General: Skin is warm and dry.     Findings: No erythema or rash.  Neurological:     Mental Status: He is alert and oriented to person, place, and time.  Psychiatric:        Behavior: Behavior normal.        Thought Content: Thought content normal.        Judgment: Judgment normal.      Lab Results  Component Value Date   WBC 4.9 03/01/2019   HGB 16.1 03/01/2019   HCT 47.7 03/01/2019   MCV 86.6 03/01/2019   PLT 176 03/01/2019   Lab Results  Component Value Date   FERRITIN 92 07/14/2018   IRON 127 07/14/2018   TIBC 309 07/14/2018   UIBC 182 07/14/2018   IRONPCTSAT 41 07/14/2018   Lab Results  Component Value Date   RBC 5.51 03/01/2019   No results found for: KPAFRELGTCHN, LAMBDASER, KAPLAMBRATIO No results found for: IGGSERUM, IGA, IGMSERUM No results found for: Odetta Pink, SPEI   Chemistry      Component Value Date/Time   NA 139 03/01/2019 1023   K 3.8 03/01/2019 1023   CL 105 03/01/2019 1023   CO2 27 03/01/2019 1023   BUN 11 03/01/2019 1023   CREATININE 1.06 03/01/2019 1023      Component Value Date/Time   CALCIUM 9.1 03/01/2019 1023   ALKPHOS 85 03/01/2019 1023   AST 21 03/01/2019 1023   ALT  25 03/01/2019 1023   BILITOT 0.6 03/01/2019 1023       [name] is a very pleasant 53 yo caucasian gentleman with metastatic adenocarcinoma of the GE junction, HER-2 positive.   He has had a fantastic response to upfront chemotherapy.  Again I think a lot of the response has been secondary to the tumor being HER-2 positive.  I just wonder  if this recurrence is HER-2 negative.  Hopefully, we will get the pathology report back this week.  Again I do think we have to get started with treatment.  We will do chemotherapy with radiation therapy.  I think he should respond very nicely to this.  Went over the side effects of treatment.  I know that Dr. Sondra Come will also go over side effects of radiation.  He does not need to have a feeding tube put in.  Again we will start treatment on 03/08/2019.  I suspect that he will get 6 weeks of radiation.  I will plan for 2 cycles of cis-platinum/5-FU.  We will plan to get him back to see [place] in about 3 weeks.  By then, he will be into his radiation therapy.  [name], MD 12/17/20204:31 PM

## 2019-03-05 ENCOUNTER — Ambulatory Visit
Admission: RE | Admit: 2019-03-05 | Discharge: 2019-03-05 | Disposition: A | Discharge status: Home / Self Care | Payer: 59 | Source: Ambulatory Visit | Attending: Radiation Oncology | Admitting: Radiation Oncology

## 2019-03-05 ENCOUNTER — Encounter: Payer: Self-pay | Admitting: Radiation Oncology

## 2019-03-05 ENCOUNTER — Encounter: Payer: Self-pay | Admitting: *Deleted

## 2019-03-05 ENCOUNTER — Telehealth: Payer: Self-pay | Admitting: Hematology & Oncology

## 2019-03-05 ENCOUNTER — Other Ambulatory Visit: Payer: Self-pay

## 2019-03-05 VITALS — BP 148/97 | HR 87 | Temp 97.8°F | Resp 20 | Wt 202.4 lb

## 2019-03-05 DIAGNOSIS — C160 Malignant neoplasm of cardia: Secondary | ICD-10-CM | POA: Insufficient documentation

## 2019-03-05 DIAGNOSIS — Z510 Encounter for antineoplastic radiation therapy: Secondary | ICD-10-CM | POA: Insufficient documentation

## 2019-03-05 MED ORDER — PROCHLORPERAZINE MALEATE 10 MG PO TABS: 10.0000 mg | ORAL_TABLET | Freq: Four times a day (QID) | ORAL | 1 refills | Status: DC | PRN

## 2019-03-05 MED ORDER — SUCRALFATE 1 G PO TABS: 1.0000 g | ORAL_TABLET | Freq: Three times a day (TID) | ORAL | 1 refills | Status: DC

## 2019-03-05 MED FILL — PROCHLORPERAZINE 10 MG TAB: 10 | 7 days supply | Qty: 30 | Fill #0

## 2019-03-05 MED FILL — SUCRALFATE 1 GM TABLET: 1 | 30 days supply | Qty: 120 | Fill #0

## 2019-03-05 NOTE — Progress Notes (Signed)
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 male who is accompanied by his wife. The patient initially presented to the ED on 11/14/2017 with right-sided pain. CT performed at that time showed multiple hepatic lesions with no adenopathy. He was referred to Dr. Collene Mares, who performed upper endoscopy on 11/19/2017. During the procedure, the patient was found to have a large mass in the esophagus. 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 PET scan performed on 12/09/2018 showed, in addition to the GE junction mass and hepatic tumors: mediastinal, left supraclavicular, and upper abdominal nodal involvement; left pelvic mass in the peritoneum; metastasis at T7.   He received 8 cycles of FOLFOX/Herceptin under [name] from 11/2017 through 05/2018. Interval PET scans showed great treatment response. He also completed 8 cycles of Xeloda. Post-treatment PET scan on 11/30/2018 showed no active disease. He was started on maintenance Herceptin on 12/07/2018.   Restaging PET scan performed on 02/26/2019 revealed: evidence for local recurrence of GE junction carcinoma with intense metabolic activity; no evidence of metastatic nodal disease or liver metastasis. Per Dr. Antonieta Pert note from yesterday, 03/04/2019, the patient was seen by Dr. Collene Mares on 03/03/2019. Repeat upper endoscopy was performed and revealed a large friable 10 cm  mass, which was starting to cause significant obstruction. Biopsy  results are not yet available.  Patient does report some mild difficulties with swallowing foods.  He does have to chew foods well at this time to swallow solid foods.  He denies any actually pain with swallowing.  He denies any pain in the chest or abdominal region.  His appetite is reasonable at this time.   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  SOCIAL HISTORY:  Social History   Tobacco Use   Smoking status: Never Smoker   Smokeless tobacco: Never Used  Substance Use Topics   Alcohol use: Yes    Comment: weekly   Drug use: No    ALLERGIES: No Known Allergies  MEDICATIONS:  Current Outpatient Medications  Medication Sig Dispense Refill   dexlansoprazole (DEXILANT) 60 MG capsule Take 1 capsule (60 mg total) by mouth daily. 30 capsule 6   lidocaine-prilocaine (EMLA) cream Apply 1 application topically as needed. 30 g 0   Multiple Vitamin (MULTI-VITAMIN DAILY PO) Take by mouth.     No current facility-administered medications for this encounter.   Facility-Administered Medications  Ordered in Other Encounters  Medication Dose Route Frequency Provider Last Rate Last Admin   sodium chloride flush (NS) 0.9 % injection 10 mL  10 mL Intravenous PRN [name],  MD   10 mL at 03/31/18 1100    REVIEW OF SYSTEMS:  A 10+ POINT REVIEW OF SYSTEMS WAS OBTAINED including neurology, dermatology, psychiatry, cardiac, respiratory, lymph, extremities, GI, GU, musculoskeletal, constitutional, reproductive, HEENT. He denies dysuria or hematuria, rectal bleeding, abdominal bloating, nausea or vomiting, and pain. He reports constipation and diarrhea.   PHYSICAL EXAM:  weight is 202 lb 6.4 oz (91.8 kg). His temperature is 97.8 F (36.6 C). His blood pressure is 148/97 (abnormal) and his pulse is 87. His respiration is 20 and oxygen saturation is 98%.   General: Alert and oriented, in no acute distress HEENT: Head is normocephalic. Extraocular movements are intact. Oropharynx is clear,  patient does have some erythema to the soft palate region which patient reports is likely due to his endoscopy earlier this week. Neck: Neck is supple, no palpable cervical or supraclavicular lymphadenopathy. Heart: Regular in rate and rhythm with no murmurs, rubs, or gallops. Chest: Clear to auscultation bilaterally, with no rhonchi, wheezes, or rales. Abdomen: Soft, nontender, nondistended, with no rigidity or guarding. Extremities: No cyanosis or edema. Lymphatics: see Neck Exam Skin: No concerning lesions. Musculoskeletal: symmetric strength and muscle tone throughout. Neurologic: Cranial nerves II through XII are grossly intact. No obvious focalities. Speech is fluent. Coordination is intact. Psychiatric: Judgment and insight are intact. Affect is appropriate.   ECOG = 1  0 - Asymptomatic (Fully active, able to carry on all predisease activities without restriction)  1 - Symptomatic but completely ambulatory (Restricted in physically strenuous activity but ambulatory and able to carry out work of a light or sedentary nature. For example, light housework, office work)  2 - Symptomatic, <50% in bed during the day (Ambulatory and capable of all self care but unable to carry out  any work activities. Up and about more than 50% of waking hours)  3 - Symptomatic, >50% in bed, but not bedbound (Capable of only limited self-care, confined to bed or chair 50% or more of waking hours)  4 - Bedbound (Completely disabled. Cannot carry on any self-care. Totally confined to bed or chair)  5 - Death   Eustace Pen MM, Creech RH, Tormey DC, et al. [phone]). "Toxicity and response criteria of the [facility]". [place] Oncol. 5 (6): 649-55  LABORATORY DATA:  Lab Results  Component Value Date   WBC 4.9 03/01/2019   HGB 16.1 03/01/2019   HCT 47.7 03/01/2019   MCV 86.6 03/01/2019   PLT 176 03/01/2019   NEUTROABS 3.6 03/01/2019   Lab Results  Component Value Date   NA 139 03/01/2019   K 3.8 03/01/2019   CL 105 03/01/2019   CO2 27 03/01/2019   GLUCOSE 153 (H) 03/01/2019   CREATININE 1.06 03/01/2019   CALCIUM 9.1 03/01/2019      RADIOGRAPHY: NM PET Image Restag (PS) Skull Base To Thigh  Result Date: 02/27/2019 CLINICAL DATA:  Initial treatment strategy for gastric carcinoma. EXAM: NUCLEAR MEDICINE PET SKULL BASE TO THIGH TECHNIQUE: 10.1 mCi F-18 FDG was injected intravenously. Full-ring PET imaging was performed from the skull base to thigh after the radiotracer. CT data was obtained and used for attenuation correction and anatomic localization. Fasting blood glucose: 104 mg/dl COMPARISON:  PET-CT 11/30/2018, 12/08/2017 FINDINGS: Mediastinal blood pool activity: SUV max 2.5 Liver activity: SUV  max 3.1 NECK: No hypermetabolic lymph nodes in the neck. Incidental CT findings: Port in the anterior chest wall with tip in distal SVC. CHEST: No hypermetabolic mediastinal or hilar nodes. No suspicious pulmonary nodules on the CT scan. Incidental CT findings: none ABDOMEN/PELVIS: There is intense activity through the distal esophagus extending into the gastric cardia with SUV max equal 8.9. Activity extends along the lesser curvature of the stomach over approximately  5-6 cm. Activity involves the distal 3 to 4 cm of the esophagus. This activity is new from comparison exam but similar to PET-CT scan of 12/08/2017. No hypermetabolic lymph nodes in the gastrohepatic ligament. No hypermetabolic mediastinal lymph nodes. No abnormal metabolic activity in the liver. No hypermetabolic periaortic or pelvic lymph nodes. Incidental CT findings: none SKELETON: No focal hypermetabolic activity to suggest skeletal metastasis. Incidental CT findings: none IMPRESSION: 1. Unfortunately, there is evidence for local recurrence of GE junction carcinoma with intense metabolic activity in distal esophagus extending the gastric cardia along the lesser curvature of the stomach. Alternatively this could represent an inflammatory or immune related response however this is less favored. Pattern is similar to initial PET-CT scan of 12/08/2017. 2. No evidence of metastatic nodal disease. 3. No evidence of liver metastasis. These results will be called to the ordering clinician or representative by the Radiologist Assistant, and communication documented in the PACS or zVision Dashboard. Electronically Signed   By: Suzy Bouchard M.D.   On: 02/27/2019 12:24   ECHOCARDIOGRAM LIMITED  Result Date: 02/09/2019   ECHOCARDIOGRAM LIMITED REPORT   Patient Name:   Matthew Reynolds Date of Exam: 02/09/2019 Medical Rec #:  [id number]      Height:       69.0 in Accession #:    [id number]     Weight:       210.0 lb Date of Birth:  Jul 20, 1965       BSA:          2.11 m Patient Age:    53 years       BP:           141/90 mmHg Patient Gender: M              HR:           67 bpm. Exam Location:  [facility]  Procedure: Limited Echo, 3D Echo, Cardiac Doppler, Color Doppler and Strain            Analysis Indications:    C16.0 Adenocarcinoma of cardio-esophgeal junction.  History:        Patient has prior history of Echocardiogram examinations, most                 recent 10/05/2018. Liver metastasis. Anemia.  Sonographer:     Jessee Avers, RDCS Referring Phys: [place]  1. Left ventricular ejection fraction, by visual estimation, is 60 to 65%. The left ventricle has normal function. There is no left ventricular hypertrophy. GLS -21.2%, normal range.  2. The left ventricle has no regional wall motion abnormalities.  3. Left ventricular diastolic parameters are consistent with Grade I diastolic dysfunction (impaired relaxation).  4. Global right ventricle has normal systolic function.The right ventricular size is normal. No increase in right ventricular wall thickness.  5. The aortic valve is tricuspid. Aortic valve regurgitation is not visualized. No evidence of aortic valve sclerosis or stenosis.  6. The mitral valve is normal in structure. No evidence of mitral valve regurgitation. No evidence  of mitral stenosis.  7. The tricuspid valve is normal in structure. Tricuspid valve regurgitation is not demonstrated.  8. Right atrial size was normal.  9. Left atrial size was normal. 10. The inferior vena cava is normal in size with greater than 50% respiratory variability, suggesting right atrial pressure of 3 mmHg. 11. TR signal is inadequate for assessing pulmonary artery systolic pressure. FINDINGS  Left Ventricle: Left ventricular ejection fraction, by visual estimation, is 60 to 65%. The left ventricle has normal function. The left ventricle has no regional wall motion abnormalities. The left ventricular internal cavity size was the left ventricle is normal in size. There is no left ventricular hypertrophy. Left ventricular diastolic parameters are consistent with Grade I diastolic dysfunction (impaired relaxation). Right Ventricle: The right ventricular size is normal. No increase in right ventricular wall thickness. Global RV systolic function is has normal systolic function. Left Atrium: Left atrial size was normal in size. Right Atrium: Right atrial size was normal in size Pericardium: There is no evidence of  pericardial effusion. Mitral Valve: The mitral valve is normal in structure. No evidence of mitral valve stenosis by observation. MV Area by PHT, 2.99 cm. MV PHT, 73.66 msec. No evidence of mitral valve regurgitation. Tricuspid Valve: The tricuspid valve is normal in structure. Tricuspid valve regurgitation is not demonstrated. Aortic Valve: The aortic valve is tricuspid. Aortic valve regurgitation is not visualized. The aortic valve is structurally normal, with no evidence of sclerosis or stenosis. Pulmonic Valve: The pulmonic valve was normal in structure. Pulmonic valve regurgitation is not visualized. Aorta: The aortic root is normal in size and structure. Venous: The inferior vena cava is normal in size with greater than 50% respiratory variability, suggesting right atrial pressure of 3 mmHg. Shunts: No atrial level shunt detected by color flow Doppler.  LEFT VENTRICLE         Normals PLAX 2D LVIDd:         4.79 cm 3.6 cm   Diastology                  Normals LVIDs:         3.22 cm 1.7 cm   LV e' lateral:   11.20 cm/s 6.42 cm/s LV PW:         0.88 cm 1.4 cm   LV E/e' lateral: 5.8        15.4 LV IVS:        0.81 cm 1.3 cm   LV e' medial:    6.20 cm/s  6.96 cm/s LV SV:         65 ml   79 ml    LV E/e' medial:  10.4       6.96 LV SV Index:   29.99   45 ml/m2                                 2D Longitudinal Strain                                 2D Strain GLS (A2C):   -19.6 %                                 2D Strain GLS (A3C):   -23.7 %  2D Strain GLS (A4C):   -20.5 %                                 2D Strain GLS Avg:     -21.2 %                                  3D Volume EF:                                 3D EF:        56 %                                 LV EDV:       144 ml                                 LV ESV:       64 ml                                 LV SV:        81 ml LEFT ATRIUM             Index       RIGHT ATRIUM LA diam:        4.30 cm 2.04 cm/m  RA Pressure: 3.00  mmHg LA Vol (A2C):   40.8 ml 19.34 ml/m LA Vol (A4C):   35.4 ml 16.78 ml/m LA Biplane Vol: 38.2 ml 18.11 ml/m   AORTA                 Normals Ao Root diam: 3.10 cm 31 mm MITRAL VALVE            Normals     TRICUSPID VALVE           Normals                                     Estimated RAP:  3.00 mmHg  MV Decel Time: 254 msec 187 ms MV E velocity: 64.50 cm/s 103 cm/s MV A velocity: 79.10 cm/s 70.3 cm/s MV E/A ratio:  0.82       1.5  Loralie Champagne MD Electronically signed by Loralie Champagne MD Signature Date/Time: 02/09/2019/5:31:40 PM    Final       IMPRESSION: 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  Given the patient's excellent performance status and young age, aggressive treatment will be indicated including radiation therapy directed at the active disease, with radiosensitizing chemotherapy.  I discussed the overall treatment course side effects and potential toxicities of radiation therapy in this situation with patient and his wife.  He appears to understand and wishes to proceed with planned course of treatment.  PLAN: Patient will proceed with CT simulation later today and treatments to begin December 23 concomitant with his radiosensitizing chemotherapy next week.  Anticipate 5-1/2  weeks of radiation therapy as part of his overall management.    ------------------------------------------------  Blair Promise, PhD, MD  This document serves as a record of services personally performed by Gery Pray, MD. It was created on his behalf by Wilburn Mylar, a trained medical scribe. The creation of this record is based on the scribe's personal observations and the provider's statements to them. This document has been checked and approved by the attending provider.

## 2019-03-05 NOTE — Progress Notes (Signed)
Patient will receive first cycle of cisplatin/fluorouracil on 12/21. Per [name], pump will be 3 days for cycle 1 only due to holiday weekend. Patient will also receive 1 liter of NS for supportive care the day of pump d/c for first cycle only. Orders entered per Dr. Antonieta Pert instructions.

## 2019-03-05 NOTE — Patient Instructions (Signed)
Coronavirus (COVID-19) Are you at risk?  Are you at risk for the Coronavirus (COVID-19)?  To be considered HIGH RISK for Coronavirus (COVID-19), you have to meet the following criteria:  . Traveled to [place], [place], [place], [place] or [place]; or in the [place] to [place], [place], [place], or [place]; and have fever, cough, and shortness of breath within the last 2 weeks of travel OR . Been in close contact with a person diagnosed with COVID-19 within the last 2 weeks and have fever, cough, and shortness of breath . IF YOU DO NOT MEET THESE CRITERIA, YOU ARE CONSIDERED LOW RISK FOR COVID-19.  What to do if you are HIGH RISK for COVID-19?  . If you are having a medical emergency, call 911. . Seek medical care right away. Before you go to a doctor's office, urgent care or emergency department, call ahead and tell them about your recent travel, contact with someone diagnosed with COVID-19, and your symptoms. You should receive instructions from your physician's office regarding next steps of care.  . When you arrive at healthcare provider, tell the healthcare staff immediately you have returned from visiting [place], [place], [place], [place] or [place]; or traveled in the [place] to [place], [place], [place], or [place]; in the last two weeks or you have been in close contact with a person diagnosed with COVID-19 in the last 2 weeks.   . Tell the health care staff about your symptoms: fever, cough and shortness of breath. . After you have been seen by a medical provider, you will be either: o Tested for (COVID-19) and discharged home on quarantine except to seek medical care if symptoms worsen, and asked to  - Stay home and avoid contact with others until you get your results (4-5 days)  - Avoid travel on public transportation if possible (such as bus, train, or airplane) or o Sent to the Emergency Department by EMS for evaluation, COVID-19 testing, and possible  admission depending on your condition and test results.  What to do if you are LOW RISK for COVID-19?  Reduce your risk of any infection by using the same precautions used for avoiding the common cold or flu:  . Wash your hands often with soap and warm water for at least 20 seconds.  If soap and water are not readily available, use an alcohol-based hand sanitizer with at least 60% alcohol.  . If coughing or sneezing, cover your mouth and nose by coughing or sneezing into the elbow areas of your shirt or coat, into a tissue or into your sleeve (not your hands). . Avoid shaking hands with others and consider head nods or verbal greetings only. . Avoid touching your eyes, nose, or mouth with unwashed hands.  . Avoid close contact with people who are sick. . Avoid places or events with large numbers of people in one location, like concerts or sporting events. . Carefully consider travel plans you have or are making. . If you are planning any travel outside or inside the [place], visit the CDC's Travelers' Health webpage for the latest health notices. . If you have some symptoms but not all symptoms, continue to monitor at home and seek medical attention if your symptoms worsen. . If you are having a medical emergency, call 911.   ADDITIONAL HEALTHCARE OPTIONS FOR PATIENTS  [facility] / e-Visit: [email or web address]         [facility]: [phone]  [facility]: [phone]                   [facility]: [phone]   

## 2019-03-05 NOTE — Telephone Encounter (Signed)
Appointments scheduled uploaded on My Chart and patient is aware per 12/17 los

## 2019-03-05 NOTE — Progress Notes (Signed)
Per [name], I faxed [facility] paperwork to [phone]. received faxed confirmation. Paperwork placed in Psychologist, clinical.

## 2019-03-05 NOTE — Progress Notes (Signed)
GI Location of Tumor / Histology: Principle Diagnosis:  Metastatic adenocarcinoma of the GE junction-HER-2 positive -- local recurrence on 03/03/2019  Therisa Doyne presented with symptoms of: Interim History:  Mr. Hammond is here today for follow-up.  I do think that we have evidence of recurrent disease.  He was seen by Dr. Collene Mares on 03/03/2019.  She did an upper endoscopy on him.  This showed a large friable 10 cm salvageable mass extending from 33-44 cm.  There is the mass at the cardia and in the gastric fundus extending to the lesser curvature.  This was biopsied.  I do not have the results back yet.  There was some bleeding.  The mass was starting to cause significant obstruction.  He is still eating fairly well.  He is not having dysphagia or odynophagia as of yet.  Biopsies revealed: Results unavailable.  Past/Anticipated interventions by surgeon, if any: None at this time.  Past/Anticipated interventions by medical oncology, if any: Per [name] 03/04/19:  I think that we need to get treatment started pretty quickly because of the mass that was somewhat friable and I do not want to see him start to bleed.  When he first presented with this problem, he had marked anemia from bleeding.  I think that radiation and chemotherapy would be a great way of trying to treat this.  Since this does appear to be a localized recurrence, I think that aggressive intervention would be reasonable.  He is young.  He is healthy.  I think he can handle aggressive therapy.  I would use cis-platinum along with 5-FU infusion.  I think this would be a good radiosensitizer for the radiation therapy.   Weight changes, if any:  Wt Readings from Last 3 Encounters:  03/05/19 202 lb 6.4 oz (91.8 kg)  03/04/19 202 lb (91.6 kg)  03/01/19 205 lb (93 kg)     Bowel/Bladder complaints, if any: Pt denies dysuria/hematuria. Pt reports both constipation and diarrhea. Pt denies rectal bleeding.  Nausea /  Vomiting, if any: Pt denies abdominal bloating, N/V.  Pain issues, if any:  Pt denies c/o pain.  Any blood per rectum:   He has had no melena or bright red blood per rectum.  SAFETY ISSUES:  Prior radiation? No  Pacemaker/ICD? No  Possible current pregnancy? N/A  Is the patient on methotrexate? No  Current Complaints/Details:  Pt presents today for initial consult with Dr. Sondra Come for Radiation Oncology. Pt is accompanied by wife.   BP (!) 148/97 (BP Location: Left Arm, Patient Position: Sitting, Cuff Size: Large)   Pulse 87   Temp 97.8 F (36.6 C)   Resp 20   Wt 202 lb 6.4 oz (91.8 kg)   SpO2 98%   BMI 29.89 kg/m   Loma Sousa, RN BSN .

## 2019-03-08 ENCOUNTER — Other Ambulatory Visit: Payer: Self-pay | Admitting: *Deleted

## 2019-03-08 ENCOUNTER — Other Ambulatory Visit: Payer: Self-pay

## 2019-03-08 ENCOUNTER — Inpatient Hospital Stay: Payer: 59

## 2019-03-08 ENCOUNTER — Encounter: Payer: Self-pay | Admitting: Hematology & Oncology

## 2019-03-08 VITALS — BP 152/90 | HR 86 | Temp 97.3°F | Resp 20

## 2019-03-08 DIAGNOSIS — C160 Malignant neoplasm of cardia: Secondary | ICD-10-CM

## 2019-03-08 DIAGNOSIS — Z5112 Encounter for antineoplastic immunotherapy: Secondary | ICD-10-CM | POA: Diagnosis not present

## 2019-03-08 DIAGNOSIS — Z7189 Other specified counseling: Secondary | ICD-10-CM

## 2019-03-08 DIAGNOSIS — C799 Secondary malignant neoplasm of unspecified site: Secondary | ICD-10-CM

## 2019-03-08 DIAGNOSIS — Z510 Encounter for antineoplastic radiation therapy: Secondary | ICD-10-CM | POA: Diagnosis not present

## 2019-03-08 LAB — CBC WITH DIFFERENTIAL (CANCER CENTER ONLY)
Abs Immature Granulocytes: 0.01 10*3/uL (ref 0.00–0.07)
Basophils Absolute: 0 10*3/uL (ref 0.0–0.1)
Basophils Relative: 1 %
Eosinophils Absolute: 0.1 10*3/uL (ref 0.0–0.5)
Eosinophils Relative: 1 %
HCT: 48.4 % (ref 39.0–52.0)
Hemoglobin: 16.4 g/dL (ref 13.0–17.0)
Immature Granulocytes: 0 %
Lymphocytes Relative: 19 %
Lymphs Abs: 1 10*3/uL (ref 0.7–4.0)
MCH: 29.1 pg (ref 26.0–34.0)
MCHC: 33.9 g/dL (ref 30.0–36.0)
MCV: 85.8 fL (ref 80.0–100.0)
Monocytes Absolute: 0.4 10*3/uL (ref 0.1–1.0)
Monocytes Relative: 8 %
Neutro Abs: 3.6 10*3/uL (ref 1.7–7.7)
Neutrophils Relative %: 71 %
Platelet Count: 180 10*3/uL (ref 150–400)
RBC: 5.64 MIL/uL (ref 4.22–5.81)
RDW: 11.5 % (ref 11.5–15.5)
WBC Count: 5 10*3/uL (ref 4.0–10.5)
nRBC: 0 % (ref 0.0–0.2)

## 2019-03-08 LAB — CMP (CANCER CENTER ONLY)
ALT: 24 U/L (ref 0–44)
AST: 20 U/L (ref 15–41)
Albumin: 3.9 g/dL (ref 3.5–5.0)
Alkaline Phosphatase: 81 U/L (ref 38–126)
Anion gap: 8 (ref 5–15)
BUN: 13 mg/dL (ref 6–20)
CO2: 27 mmol/L (ref 22–32)
Calcium: 9 mg/dL (ref 8.9–10.3)
Chloride: 104 mmol/L (ref 98–111)
Creatinine: 1.04 mg/dL (ref 0.61–1.24)
GFR, Est AFR Am: >60 mL/min (ref >60)
GFR, Estimated: >60 mL/min (ref >60)
Glucose, Bld: 102 mg/dL — ABNORMAL HIGH (ref 70–99)
Potassium: 3.9 mmol/L (ref 3.5–5.1)
Sodium: 139 mmol/L (ref 135–145)
Total Bilirubin: 0.6 mg/dL (ref 0.3–1.2)
Total Protein: 6.7 g/dL (ref 6.5–8.1)

## 2019-03-08 MED ORDER — LORAZEPAM 0.5 MG PO TABS: 0.5000 mg | ORAL_TABLET | Freq: Four times a day (QID) | ORAL | 0 refills | Status: DC | PRN

## 2019-03-08 MED ORDER — POTASSIUM CHLORIDE 2 MEQ/ML IV SOLN
Freq: Once | INTRAVENOUS | Status: AC
  Filled 2019-03-08: qty 10

## 2019-03-08 MED ORDER — DEXAMETHASONE 4 MG PO TABS: ORAL_TABLET | ORAL | 1 refills | Status: DC

## 2019-03-08 MED ORDER — SODIUM CHLORIDE 0.9 % IV SOLN
Freq: Once | INTRAVENOUS | Status: AC
  Filled 2019-03-08: qty 250

## 2019-03-08 MED ORDER — SODIUM CHLORIDE 0.9 % IV SOLN
780.0000 mg/m2/d | INTRAVENOUS | Status: DC
  Administered 2019-03-08: 16:00:00 5000 mg via INTRAVENOUS
  Filled 2019-03-08: qty 100

## 2019-03-08 MED ORDER — ONDANSETRON HCL 8 MG PO TABS: 8.0000 mg | ORAL_TABLET | Freq: Two times a day (BID) | ORAL | 1 refills | Status: DC | PRN

## 2019-03-08 MED ORDER — SODIUM CHLORIDE 0.9 % IV SOLN
INTRAVENOUS | Status: DC
  Filled 2019-03-08: qty 250

## 2019-03-08 MED ORDER — SODIUM CHLORIDE 0.9 % IV SOLN
150.0000 mg | Freq: Once | INTRAVENOUS | Status: AC
  Administered 2019-03-08: 150 mg via INTRAVENOUS
  Filled 2019-03-08: qty 5

## 2019-03-08 MED ORDER — DEXAMETHASONE SODIUM PHOSPHATE 10 MG/ML IJ SOLN
10.0000 mg | Freq: Once | INTRAMUSCULAR | Status: AC
  Administered 2019-03-08: 10 mg via INTRAVENOUS

## 2019-03-08 MED ORDER — SODIUM CHLORIDE 0.9 % IV SOLN
80.0000 mg/m2 | Freq: Once | INTRAVENOUS | Status: AC
  Administered 2019-03-08: 170 mg via INTRAVENOUS
  Filled 2019-03-08: qty 170

## 2019-03-08 MED ORDER — PALONOSETRON HCL INJECTION 0.25 MG/5ML
0.2500 mg | Freq: Once | INTRAVENOUS | Status: AC
  Administered 2019-03-08: 0.25 mg via INTRAVENOUS

## 2019-03-08 MED ORDER — DEXAMETHASONE SODIUM PHOSPHATE 10 MG/ML IJ SOLN
INTRAMUSCULAR | Status: AC
  Filled 2019-03-08: qty 1

## 2019-03-08 MED FILL — LORazepam 0.5 MG TABS: 0.5 | 7 days supply | Qty: 30 | Fill #0

## 2019-03-08 NOTE — Patient Instructions (Signed)

## 2019-03-08 NOTE — Progress Notes (Signed)
Pre hydration fluids given two hours prior to Cisplatin and two hours post Cisplatin.

## 2019-03-09 ENCOUNTER — Other Ambulatory Visit: Payer: Self-pay | Admitting: Hematology & Oncology

## 2019-03-09 DIAGNOSIS — C160 Malignant neoplasm of cardia: Secondary | ICD-10-CM

## 2019-03-09 DIAGNOSIS — C799 Secondary malignant neoplasm of unspecified site: Secondary | ICD-10-CM

## 2019-03-09 DIAGNOSIS — Z7189 Other specified counseling: Secondary | ICD-10-CM

## 2019-03-09 MED FILL — ONDANSETRON HCL 8 MG TABLET: 8 | 15 days supply | Qty: 30 | Fill #0

## 2019-03-10 ENCOUNTER — Other Ambulatory Visit: Payer: Self-pay

## 2019-03-10 ENCOUNTER — Ambulatory Visit
Admission: RE | Admit: 2019-03-10 | Discharge: 2019-03-10 | Disposition: A | Discharge status: Home / Self Care | Payer: 59 | Source: Ambulatory Visit | Attending: Radiation Oncology | Admitting: Radiation Oncology

## 2019-03-10 ENCOUNTER — Other Ambulatory Visit: Payer: Self-pay | Admitting: *Deleted

## 2019-03-10 ENCOUNTER — Telehealth: Payer: Self-pay | Admitting: *Deleted

## 2019-03-10 DIAGNOSIS — Z510 Encounter for antineoplastic radiation therapy: Secondary | ICD-10-CM | POA: Diagnosis not present

## 2019-03-10 MED ORDER — BACLOFEN 10 MG PO TABS: 10.0000 mg | ORAL_TABLET | Freq: Three times a day (TID) | ORAL | 0 refills | Status: DC | PRN

## 2019-03-10 MED FILL — BACLOFEN 10 MG TABS: 10 | 10 days supply | Qty: 30 | Fill #0

## 2019-03-10 NOTE — Telephone Encounter (Signed)
Call received from patient stating that he has had hiccoughs for the last 24 hours and would like to know if there is anything he can do to help them.  [name] notified.  Call placed back to patient and patient notified per order of [name] that a prescription for Baclofen would be sent to his pharmacy of choice.  Pt appreciative of call and verbalizes an understanding to come to [facility] for IVF's and pump stop after XRT treatment.

## 2019-03-11 ENCOUNTER — Ambulatory Visit
Admission: RE | Admit: 2019-03-11 | Discharge: 2019-03-11 | Disposition: A | Discharge status: Home / Self Care | Payer: 59 | Source: Ambulatory Visit | Attending: Radiation Oncology | Admitting: Radiation Oncology

## 2019-03-11 ENCOUNTER — Other Ambulatory Visit: Payer: Self-pay

## 2019-03-11 ENCOUNTER — Inpatient Hospital Stay: Payer: 59

## 2019-03-11 VITALS — BP 140/95 | HR 53 | Temp 98.6°F | Resp 18

## 2019-03-11 DIAGNOSIS — Z7189 Other specified counseling: Secondary | ICD-10-CM

## 2019-03-11 DIAGNOSIS — Z510 Encounter for antineoplastic radiation therapy: Secondary | ICD-10-CM | POA: Diagnosis not present

## 2019-03-11 DIAGNOSIS — Z5112 Encounter for antineoplastic immunotherapy: Secondary | ICD-10-CM | POA: Diagnosis not present

## 2019-03-11 DIAGNOSIS — C160 Malignant neoplasm of cardia: Secondary | ICD-10-CM

## 2019-03-11 MED ORDER — SODIUM CHLORIDE 0.9% FLUSH
10.0000 mL | INTRAVENOUS | Status: DC | PRN
  Administered 2019-03-11: 12:00:00 10 mL
  Filled 2019-03-11: qty 10

## 2019-03-11 MED ORDER — HEPARIN SOD (PORK) LOCK FLUSH 100 UNIT/ML IV SOLN
500.0000 [IU] | Freq: Once | INTRAVENOUS | Status: AC | PRN
  Administered 2019-03-11: 12:00:00 500 [IU]
  Filled 2019-03-11: qty 5

## 2019-03-11 MED ORDER — SODIUM CHLORIDE 0.9 % IV SOLN
Freq: Once | INTRAVENOUS | Status: AC
  Filled 2019-03-11: qty 250

## 2019-03-11 NOTE — Patient Instructions (Signed)
Dehydration, Adult  Dehydration is a condition in which there is not enough fluid or water in the body. This happens when you lose more fluids than you take in. Important organs, such as the kidneys, brain, and heart, cannot function without a proper amount of fluids. Any loss of fluids from the body can lead to dehydration. Dehydration can range from mild to severe. This condition should be treated right away to prevent it from becoming severe. What are the causes? This condition may be caused by:  Vomiting.  Diarrhea.  Excessive sweating, such as from heat exposure or exercise.  Not drinking enough fluid, especially: ? When ill. ? While doing activity that requires a lot of energy.  Excessive urination.  Fever.  Infection.  Certain medicines, such as medicines that cause the body to lose excess fluid (diuretics).  Inability to access safe drinking water.  Reduced physical ability to get adequate water and food. What increases the risk? This condition is more likely to develop in people:  Who have a poorly controlled long-term (chronic) illness, such as diabetes, heart disease, or kidney disease.  Who are age 20 or older.  Who are disabled.  Who live in a place with high altitude.  Who play endurance sports. What are the signs or symptoms? Symptoms of mild dehydration may include:  Thirst.  Dry lips.  Slightly dry mouth.  Dry, warm skin.  Dizziness. Symptoms of moderate dehydration may include:  Very dry mouth.  Muscle cramps.  Dark urine. Urine may be the color of tea.  Decreased urine production.  Decreased tear production.  Heartbeat that is irregular or faster than normal (palpitations).  Headache.  Light-headedness, especially when you stand up from a sitting position.  Fainting (syncope). Symptoms of severe dehydration may include:  Changes in skin, such as: ? Cold and clammy skin. ? Blotchy (mottled) or pale skin. ? Skin that does  not quickly return to normal after being lightly pinched and released (poor skin turgor).  Changes in body fluids, such as: ? Extreme thirst. ? No tear production. ? Inability to sweat when body temperature is high, such as in hot weather. ? Very little urine production.  Changes in vital signs, such as: ? Weak pulse. ? Pulse that is more than 100 beats a minute when sitting still. ? Rapid breathing. ? Low blood pressure.  Other changes, such as: ? Sunken eyes. ? Cold hands and feet. ? Confusion. ? Lack of energy (lethargy). ? Difficulty waking up from sleep. ? Short-term weight loss. ? Unconsciousness. How is this diagnosed? This condition is diagnosed based on your symptoms and a physical exam. Blood and urine tests may be done to help confirm the diagnosis. How is this treated? Treatment for this condition depends on the severity. Mild or moderate dehydration can often be treated at home. Treatment should be started right away. Do not wait until dehydration becomes severe. Severe dehydration is an emergency and it needs to be treated in a hospital. Treatment for mild dehydration may include:  Drinking more fluids.  Replacing salts and minerals in your blood (electrolytes) that you may have lost. Treatment for moderate dehydration may include:  Drinking an oral rehydration solution (ORS). This is a drink that helps you replace fluids and electrolytes (rehydrate). It can be found at pharmacies and retail stores. Treatment for severe dehydration may include:  Receiving fluids through an IV tube.  Receiving an electrolyte solution through a feeding tube that is passed through your nose and  into your stomach (nasogastric tube, or NG tube).  Correcting any abnormalities in electrolytes.  Treating the underlying cause of dehydration. Follow these instructions at home:  If directed by your health care provider, drink an ORS: ? Make an ORS by following instructions on the  package. ? Start by drinking small amounts, about  cup (120 mL) every 5-10 minutes. ? Slowly increase how much you drink until you have taken the amount recommended by your health care provider.  Drink enough clear fluid to keep your urine clear or pale yellow. If you were told to drink an ORS, finish the ORS first, then start slowly drinking other clear fluids. Drink fluids such as: ? Water. Do not drink only water. Doing that can lead to having too little salt (sodium) in the body (hyponatremia). ? Ice chips. ? Fruit juice that you have added water to (diluted fruit juice). ? Low-calorie sports drinks.  Avoid: ? Alcohol. ? Drinks that contain a lot of sugar. These include high-calorie sports drinks, fruit juice that is not diluted, and soda. ? Caffeine. ? Foods that are greasy or contain a lot of fat or sugar.  Take over-the-counter and prescription medicines only as told by your health care provider.  Do not take sodium tablets. This can lead to having too much sodium in the body (hypernatremia).  Eat foods that contain a healthy balance of electrolytes, such as bananas, oranges, potatoes, tomatoes, and spinach.  Keep all follow-up visits as told by your health care provider. This is important. Contact a health care provider if:  You have abdominal pain that: ? Gets worse. ? Stays in one area (localizes).  You have a rash.  You have a stiff neck.  You are more irritable than usual.  You are sleepier or more difficult to wake up than usual.  You feel weak or dizzy.  You feel very thirsty.  You have urinated only a small amount of very dark urine over 6-8 hours. Get help right away if:  You have symptoms of severe dehydration.  You cannot drink fluids without vomiting.  Your symptoms get worse with treatment.  You have a fever.  You have a severe headache.  You have vomiting or diarrhea that: ? Gets worse. ? Does not go away.  You have blood or green matter  (bile) in your vomit.  You have blood in your stool. This may cause stool to look black and tarry.  You have not urinated in 6-8 hours.  You faint.  Your heart rate while sitting still is over 100 beats a minute.  You have trouble breathing. This information is not intended to replace advice given to you by your health care provider. Make sure you discuss any questions you have with your health care provider. Document Released: 03/04/2005 Document Revised: 02/14/2017 Document Reviewed: 04/28/2015 Elsevier Patient Education  2020 [employer].

## 2019-03-15 ENCOUNTER — Other Ambulatory Visit: Payer: Self-pay

## 2019-03-15 ENCOUNTER — Ambulatory Visit
Admission: RE | Admit: 2019-03-15 | Discharge: 2019-03-15 | Disposition: A | Discharge status: Home / Self Care | Payer: 59 | Source: Ambulatory Visit | Attending: Radiation Oncology | Admitting: Radiation Oncology

## 2019-03-15 DIAGNOSIS — Z510 Encounter for antineoplastic radiation therapy: Secondary | ICD-10-CM | POA: Diagnosis not present

## 2019-03-16 ENCOUNTER — Ambulatory Visit
Admission: RE | Admit: 2019-03-16 | Discharge: 2019-03-16 | Disposition: A | Discharge status: Home / Self Care | Payer: 59 | Source: Ambulatory Visit | Attending: Radiation Oncology | Admitting: Radiation Oncology

## 2019-03-16 DIAGNOSIS — Z510 Encounter for antineoplastic radiation therapy: Secondary | ICD-10-CM | POA: Diagnosis not present

## 2019-03-17 ENCOUNTER — Ambulatory Visit
Admission: RE | Admit: 2019-03-17 | Discharge: 2019-03-17 | Disposition: A | Discharge status: Home / Self Care | Payer: 59 | Source: Ambulatory Visit | Attending: Radiation Oncology | Admitting: Radiation Oncology

## 2019-03-17 DIAGNOSIS — Z510 Encounter for antineoplastic radiation therapy: Secondary | ICD-10-CM | POA: Diagnosis not present

## 2019-03-18 ENCOUNTER — Ambulatory Visit
Admission: RE | Admit: 2019-03-18 | Discharge: 2019-03-18 | Disposition: A | Discharge status: Home / Self Care | Payer: 59 | Source: Ambulatory Visit | Attending: Radiation Oncology | Admitting: Radiation Oncology

## 2019-03-18 ENCOUNTER — Other Ambulatory Visit: Payer: Self-pay

## 2019-03-18 DIAGNOSIS — Z510 Encounter for antineoplastic radiation therapy: Secondary | ICD-10-CM | POA: Diagnosis not present

## 2019-03-22 ENCOUNTER — Other Ambulatory Visit: Payer: Self-pay

## 2019-03-22 ENCOUNTER — Ambulatory Visit
Admission: RE | Admit: 2019-03-22 | Discharge: 2019-03-22 | Disposition: A | Discharge status: Home / Self Care | Payer: 59 | Source: Ambulatory Visit | Attending: Radiation Oncology | Admitting: Radiation Oncology

## 2019-03-22 DIAGNOSIS — Z510 Encounter for antineoplastic radiation therapy: Secondary | ICD-10-CM | POA: Insufficient documentation

## 2019-03-22 DIAGNOSIS — C160 Malignant neoplasm of cardia: Secondary | ICD-10-CM | POA: Diagnosis present

## 2019-03-23 ENCOUNTER — Ambulatory Visit
Admission: RE | Admit: 2019-03-23 | Discharge: 2019-03-23 | Disposition: A | Discharge status: Home / Self Care | Payer: 59 | Source: Ambulatory Visit | Attending: Radiation Oncology | Admitting: Radiation Oncology

## 2019-03-23 ENCOUNTER — Encounter: Payer: Self-pay | Admitting: Hematology & Oncology

## 2019-03-23 DIAGNOSIS — Z510 Encounter for antineoplastic radiation therapy: Secondary | ICD-10-CM | POA: Diagnosis not present

## 2019-03-24 ENCOUNTER — Other Ambulatory Visit: Payer: Self-pay

## 2019-03-24 ENCOUNTER — Ambulatory Visit
Admission: RE | Admit: 2019-03-24 | Discharge: 2019-03-24 | Disposition: A | Discharge status: Home / Self Care | Payer: 59 | Source: Ambulatory Visit | Attending: Radiation Oncology | Admitting: Radiation Oncology

## 2019-03-24 DIAGNOSIS — Z510 Encounter for antineoplastic radiation therapy: Secondary | ICD-10-CM | POA: Diagnosis not present

## 2019-03-25 ENCOUNTER — Encounter: Payer: Self-pay | Admitting: Hematology & Oncology

## 2019-03-25 ENCOUNTER — Inpatient Hospital Stay (HOSPITAL_BASED_OUTPATIENT_CLINIC_OR_DEPARTMENT_OTHER): Payer: 59 | Admitting: Hematology & Oncology

## 2019-03-25 ENCOUNTER — Other Ambulatory Visit: Payer: Self-pay

## 2019-03-25 ENCOUNTER — Ambulatory Visit
Admission: RE | Admit: 2019-03-25 | Discharge: 2019-03-25 | Disposition: A | Discharge status: Home / Self Care | Payer: 59 | Source: Ambulatory Visit | Attending: Radiation Oncology | Admitting: Radiation Oncology

## 2019-03-25 ENCOUNTER — Inpatient Hospital Stay: Payer: 59

## 2019-03-25 ENCOUNTER — Inpatient Hospital Stay: Payer: 59 | Attending: Hematology & Oncology

## 2019-03-25 VITALS — BP 142/84 | HR 95 | Temp 98.3°F | Resp 17 | Wt 192.0 lb

## 2019-03-25 DIAGNOSIS — Z510 Encounter for antineoplastic radiation therapy: Secondary | ICD-10-CM | POA: Diagnosis not present

## 2019-03-25 DIAGNOSIS — R112 Nausea with vomiting, unspecified: Secondary | ICD-10-CM | POA: Diagnosis not present

## 2019-03-25 DIAGNOSIS — Z5112 Encounter for antineoplastic immunotherapy: Secondary | ICD-10-CM | POA: Diagnosis not present

## 2019-03-25 DIAGNOSIS — R5383 Other fatigue: Secondary | ICD-10-CM | POA: Insufficient documentation

## 2019-03-25 DIAGNOSIS — Z923 Personal history of irradiation: Secondary | ICD-10-CM | POA: Insufficient documentation

## 2019-03-25 DIAGNOSIS — Z7952 Long term (current) use of systemic steroids: Secondary | ICD-10-CM | POA: Diagnosis not present

## 2019-03-25 DIAGNOSIS — Z5111 Encounter for antineoplastic chemotherapy: Secondary | ICD-10-CM | POA: Diagnosis not present

## 2019-03-25 DIAGNOSIS — C160 Malignant neoplasm of cardia: Secondary | ICD-10-CM

## 2019-03-25 DIAGNOSIS — Z79899 Other long term (current) drug therapy: Secondary | ICD-10-CM | POA: Insufficient documentation

## 2019-03-25 DIAGNOSIS — Z95828 Presence of other vascular implants and grafts: Secondary | ICD-10-CM

## 2019-03-25 LAB — CMP (CANCER CENTER ONLY)
ALT: 50 U/L — ABNORMAL HIGH (ref 0–44)
AST: 27 U/L (ref 15–41)
Albumin: 3.9 g/dL (ref 3.5–5.0)
Alkaline Phosphatase: 86 U/L (ref 38–126)
Anion gap: 6 (ref 5–15)
BUN: 17 mg/dL (ref 6–20)
CO2: 29 mmol/L (ref 22–32)
Calcium: 9 mg/dL (ref 8.9–10.3)
Chloride: 103 mmol/L (ref 98–111)
Creatinine: 0.96 mg/dL (ref 0.61–1.24)
GFR, Est AFR Am: >60 mL/min (ref >60)
GFR, Estimated: >60 mL/min (ref >60)
Glucose, Bld: 112 mg/dL — ABNORMAL HIGH (ref 70–99)
Potassium: 4.3 mmol/L (ref 3.5–5.1)
Sodium: 138 mmol/L (ref 135–145)
Total Bilirubin: 0.4 mg/dL (ref 0.3–1.2)
Total Protein: 6.2 g/dL — ABNORMAL LOW (ref 6.5–8.1)

## 2019-03-25 LAB — CBC WITH DIFFERENTIAL (CANCER CENTER ONLY)
Abs Immature Granulocytes: 0.01 10*3/uL (ref 0.00–0.07)
Basophils Absolute: 0 10*3/uL (ref 0.0–0.1)
Basophils Relative: 1 %
Eosinophils Absolute: 0 10*3/uL (ref 0.0–0.5)
Eosinophils Relative: 1 %
HCT: 46.4 % (ref 39.0–52.0)
Hemoglobin: 15.8 g/dL (ref 13.0–17.0)
Immature Granulocytes: 1 %
Lymphocytes Relative: 24 %
Lymphs Abs: 0.5 10*3/uL — ABNORMAL LOW (ref 0.7–4.0)
MCH: 28.7 pg (ref 26.0–34.0)
MCHC: 34.1 g/dL (ref 30.0–36.0)
MCV: 84.4 fL (ref 80.0–100.0)
Monocytes Absolute: 0.4 10*3/uL (ref 0.1–1.0)
Monocytes Relative: 17 %
Neutro Abs: 1.3 10*3/uL — ABNORMAL LOW (ref 1.7–7.7)
Neutrophils Relative %: 56 %
Platelet Count: 122 10*3/uL — ABNORMAL LOW (ref 150–400)
RBC: 5.5 MIL/uL (ref 4.22–5.81)
RDW: 11.7 % (ref 11.5–15.5)
WBC Count: 2.2 10*3/uL — ABNORMAL LOW (ref 4.0–10.5)
nRBC: 0 % (ref 0.0–0.2)

## 2019-03-25 MED ORDER — HEPARIN SOD (PORK) LOCK FLUSH 100 UNIT/ML IV SOLN
500.0000 [IU] | Freq: Once | INTRAVENOUS | Status: AC
  Administered 2019-03-25: 14:00:00 500 [IU] via INTRAVENOUS
  Filled 2019-03-25: qty 5

## 2019-03-25 MED ORDER — SODIUM CHLORIDE 0.9% FLUSH
10.0000 mL | INTRAVENOUS | Status: DC | PRN
  Administered 2019-03-25: 10 mL via INTRAVENOUS
  Filled 2019-03-25: qty 10

## 2019-03-25 MED FILL — PROCHLORPERAZINE 10 MG TAB: 10 | 7 days supply | Qty: 30 | Fill #1

## 2019-03-25 NOTE — Progress Notes (Signed)
Hematology and Oncology Follow Up Visit  Matthew Reynolds [id number] Jan 15, 1966 54 y.o. 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  He really looks good.  He is had 1 cycle of chemotherapy.  He has had 10 radiation treatments.  He has had no problems with odynophagia or dysphagia.  He seems to be swallowing fairly well right now.  There is been no problems with fatigue.  He has had no diarrhea.  There is been no bleeding.  He has had no headache.  He has had no mouth sores.  I have to believe that he is responding nicely to date.  Overall, his performance status is ECOG 1.    Medications:  Allergies as of 03/25/2019   No Known Allergies     Medication List       Accurate as of March 25, 2019  2:30 PM. If you have any questions, ask your nurse or doctor.        baclofen 10 MG tablet Commonly known as: LIORESAL Take 1 tablet (10 mg total) by mouth every 8 (eight) hours as needed for muscle spasms.   dexamethasone 4 MG tablet Commonly known as: DECADRON Take 2 tablets by mouth once a day on the day after chemotherapy and then take 2 tablets two times a day for 2 days. Take with food.   Dexilant 60 MG capsule Generic drug: dexlansoprazole Take 1 capsule (60 mg total) by mouth daily.   lidocaine-prilocaine cream Commonly known as: EMLA Apply 1 application topically as needed.   LORazepam 0.5 MG tablet Commonly known as: Ativan Take 1 tablet (0.5 mg total) by mouth every 6 (six) hours as needed (Nausea or vomiting).   MULTI-VITAMIN DAILY PO Take by mouth.   ondansetron 8 MG  tablet Commonly known as: ZOFRAN TAKE 1 TABLET (8 MG TOTAL) BY MOUTH 2 (TWO) TIMES DAILY AS NEEDED FOR REFRACTORY NAUSEA / VOMITING. START ON DAY 3 AFTER CHEMOTHERAPY.   prochlorperazine 10 MG tablet Commonly known as: COMPAZINE Take 1 tablet (10 mg total) by mouth every 6 (six) hours as needed (Nausea or vomiting).   sucralfate 1 g tablet Commonly known as: Carafate Take 1 tablet (1 g total) by mouth 4 (four) times daily -  with meals and at bedtime. Crush and dissolve in 10 mL of water prior to taking       Allergies: No Known Allergies  Past Medical History, Surgical history, Social history, and Family History were reviewed and updated.  Review of Systems: Review of Systems  Constitutional: Negative.   HENT: Negative.   Eyes: Negative.   Respiratory: Negative.   Cardiovascular: Negative.   Gastrointestinal: Negative.   Genitourinary: Negative.   Musculoskeletal: Negative.   Skin: Negative.   Neurological: Positive for tingling.  Endo/Heme/Allergies: Negative.   Psychiatric/Behavioral: Negative.    Marland Kitchen   Physical Exam:  weight is 192 lb (87.1 kg). His temporal temperature is 98.3 F (36.8 C). His blood pressure is 142/84 (abnormal) and his pulse is 95. His respiration is 17 and oxygen saturation is 100%.   Wt Readings from Last  3 Encounters:  03/25/19 192 lb (87.1 kg)  03/05/19 202 lb 6.4 oz (91.8 kg)  03/04/19 202 lb (91.6 kg)    Physical Exam Vitals reviewed.  HENT:     Head: Normocephalic and atraumatic.  Eyes:     Pupils: Pupils are equal, round, and reactive to light.  Cardiovascular:     Rate and Rhythm: Normal rate and regular rhythm.     Heart sounds: Normal heart sounds.  Pulmonary:     Effort: Pulmonary effort is normal.     Breath sounds: Normal breath sounds.  Abdominal:     General: Bowel sounds are normal.     Palpations: Abdomen is soft.  Musculoskeletal:        General: No tenderness or deformity. Normal range of motion.     Cervical  back: Normal range of motion.  Lymphadenopathy:     Cervical: No cervical adenopathy.  Skin:    General: Skin is warm and dry.     Findings: No erythema or rash.  Neurological:     Mental Status: He is alert and oriented to person, place, and time.  Psychiatric:        Behavior: Behavior normal.        Thought Content: Thought content normal.        Judgment: Judgment normal.      Lab Results  Component Value Date   WBC 2.2 (L) 03/25/2019   HGB 15.8 03/25/2019   HCT 46.4 03/25/2019   MCV 84.4 03/25/2019   PLT 122 (L) 03/25/2019   Lab Results  Component Value Date   FERRITIN 92 07/14/2018   IRON 127 07/14/2018   TIBC 309 07/14/2018   UIBC 182 07/14/2018   IRONPCTSAT 41 07/14/2018   Lab Results  Component Value Date   RBC 5.50 03/25/2019   No results found for: KPAFRELGTCHN, LAMBDASER, KAPLAMBRATIO No results found for: IGGSERUM, IGA, IGMSERUM No results found for: Odetta Pink, SPEI   Chemistry      Component Value Date/Time   NA 138 03/25/2019 1355   K 4.3 03/25/2019 1355   CL 103 03/25/2019 1355   CO2 29 03/25/2019 1355   BUN 17 03/25/2019 1355   CREATININE 0.96 03/25/2019 1355      Component Value Date/Time   CALCIUM 9.0 03/25/2019 1355   ALKPHOS 86 03/25/2019 1355   AST 27 03/25/2019 1355   ALT 50 (H) 03/25/2019 1355   BILITOT 0.4 03/25/2019 1355       [name] is a very pleasant 54 yo caucasian gentleman with metastatic adenocarcinoma of the GE junction, HER-2 positive.   I am just very pleased that his quality life is doing so well right now.  He has tolerated treatment nicely to date.  He has shown a lot of toughness.  He will get his second cycle of chemotherapy on January 18.  Hopefully, they will be the last cycle of treatment that we need on him.  Hopefully, he will not develop any issues with radiation esophagitis.  I know that radiation oncology is doing a great  job with him and being very proactive with any symptoms that he has.    [name], MD 1/7/20212:30 PM

## 2019-03-26 ENCOUNTER — Other Ambulatory Visit: Payer: Self-pay

## 2019-03-26 ENCOUNTER — Ambulatory Visit
Admission: RE | Admit: 2019-03-26 | Discharge: 2019-03-26 | Disposition: A | Discharge status: Home / Self Care | Payer: 59 | Source: Ambulatory Visit | Attending: Radiation Oncology | Admitting: Radiation Oncology

## 2019-03-26 DIAGNOSIS — Z510 Encounter for antineoplastic radiation therapy: Secondary | ICD-10-CM | POA: Diagnosis not present

## 2019-03-26 LAB — IRON AND TIBC
Iron: 92 ug/dL (ref 42–163)
Saturation Ratios: 34 % (ref 20–55)
TIBC: 274 ug/dL (ref 202–409)
UIBC: 182 ug/dL (ref 117–376)

## 2019-03-26 LAB — FERRITIN: Ferritin: 215 ng/mL (ref 24–336)

## 2019-03-29 ENCOUNTER — Ambulatory Visit: Payer: 59 | Admitting: Hematology & Oncology

## 2019-03-29 ENCOUNTER — Other Ambulatory Visit: Payer: 59

## 2019-03-29 ENCOUNTER — Ambulatory Visit: Payer: 59

## 2019-03-29 ENCOUNTER — Ambulatory Visit
Admission: RE | Admit: 2019-03-29 | Discharge: 2019-03-29 | Disposition: A | Discharge status: Home / Self Care | Payer: 59 | Source: Ambulatory Visit | Attending: Radiation Oncology | Admitting: Radiation Oncology

## 2019-03-29 ENCOUNTER — Other Ambulatory Visit: Payer: Self-pay

## 2019-03-29 DIAGNOSIS — Z510 Encounter for antineoplastic radiation therapy: Secondary | ICD-10-CM | POA: Diagnosis not present

## 2019-03-30 ENCOUNTER — Ambulatory Visit
Admission: RE | Admit: 2019-03-30 | Discharge: 2019-03-30 | Disposition: A | Discharge status: Home / Self Care | Payer: 59 | Source: Ambulatory Visit | Attending: Radiation Oncology | Admitting: Radiation Oncology

## 2019-03-30 ENCOUNTER — Other Ambulatory Visit: Payer: Self-pay

## 2019-03-30 DIAGNOSIS — Z510 Encounter for antineoplastic radiation therapy: Secondary | ICD-10-CM | POA: Diagnosis not present

## 2019-03-31 ENCOUNTER — Ambulatory Visit
Admission: RE | Admit: 2019-03-31 | Discharge: 2019-03-31 | Disposition: A | Discharge status: Home / Self Care | Payer: 59 | Source: Ambulatory Visit | Attending: Radiation Oncology | Admitting: Radiation Oncology

## 2019-03-31 ENCOUNTER — Other Ambulatory Visit: Payer: Self-pay

## 2019-03-31 DIAGNOSIS — Z510 Encounter for antineoplastic radiation therapy: Secondary | ICD-10-CM | POA: Diagnosis not present

## 2019-04-01 ENCOUNTER — Other Ambulatory Visit: Payer: Self-pay

## 2019-04-01 ENCOUNTER — Ambulatory Visit
Admission: RE | Admit: 2019-04-01 | Discharge: 2019-04-01 | Disposition: A | Discharge status: Home / Self Care | Payer: 59 | Source: Ambulatory Visit | Attending: Radiation Oncology | Admitting: Radiation Oncology

## 2019-04-01 ENCOUNTER — Other Ambulatory Visit: Payer: Self-pay | Admitting: Radiation Oncology

## 2019-04-01 ENCOUNTER — Telehealth: Payer: Self-pay | Admitting: *Deleted

## 2019-04-01 DIAGNOSIS — Z510 Encounter for antineoplastic radiation therapy: Secondary | ICD-10-CM | POA: Diagnosis not present

## 2019-04-01 MED ORDER — SUCRALFATE 1 GM/10ML PO SUSP: 1.0000 g | Freq: Three times a day (TID) | ORAL | 0 refills | Status: DC

## 2019-04-01 MED FILL — SUCRALFATE 1 GM/10ML SUSP: 1 | 11 days supply | Qty: 420 | Fill #0

## 2019-04-01 NOTE — Telephone Encounter (Signed)
Message received from [facility] with Dr. Sondra Come to notify [name] of pt.'s elevated BP.  Call placed to patient and patient states that he has had no headache or dizziness and no other complaints besides nausea, which has been controlled by Compazine.  [name] notified.  Call placed back to patient and patient notified that [name] would like for him to monitor his BP at this time and to call office back if he has any headaches or dizziness.  Pt appreciative of call and has no questions or concerns at this time.

## 2019-04-02 ENCOUNTER — Ambulatory Visit
Admission: RE | Admit: 2019-04-02 | Discharge: 2019-04-02 | Disposition: A | Discharge status: Home / Self Care | Payer: 59 | Source: Ambulatory Visit | Attending: Radiation Oncology | Admitting: Radiation Oncology

## 2019-04-02 ENCOUNTER — Other Ambulatory Visit: Payer: Self-pay

## 2019-04-02 DIAGNOSIS — Z510 Encounter for antineoplastic radiation therapy: Secondary | ICD-10-CM | POA: Diagnosis not present

## 2019-04-05 ENCOUNTER — Inpatient Hospital Stay: Payer: 59

## 2019-04-05 ENCOUNTER — Encounter: Payer: Self-pay | Admitting: Hematology & Oncology

## 2019-04-05 ENCOUNTER — Inpatient Hospital Stay (HOSPITAL_BASED_OUTPATIENT_CLINIC_OR_DEPARTMENT_OTHER): Payer: 59 | Admitting: Hematology & Oncology

## 2019-04-05 ENCOUNTER — Ambulatory Visit
Admission: RE | Admit: 2019-04-05 | Discharge: 2019-04-05 | Disposition: A | Discharge status: Home / Self Care | Payer: 59 | Source: Ambulatory Visit | Attending: Radiation Oncology | Admitting: Radiation Oncology

## 2019-04-05 ENCOUNTER — Other Ambulatory Visit: Payer: Self-pay | Admitting: *Deleted

## 2019-04-05 ENCOUNTER — Other Ambulatory Visit: Payer: Self-pay

## 2019-04-05 VITALS — BP 134/95 | HR 89 | Temp 97.3°F | Resp 17 | Ht 69.0 in | Wt 185.0 lb

## 2019-04-05 DIAGNOSIS — C799 Secondary malignant neoplasm of unspecified site: Secondary | ICD-10-CM

## 2019-04-05 DIAGNOSIS — C160 Malignant neoplasm of cardia: Secondary | ICD-10-CM

## 2019-04-05 DIAGNOSIS — Z7189 Other specified counseling: Secondary | ICD-10-CM

## 2019-04-05 DIAGNOSIS — Z510 Encounter for antineoplastic radiation therapy: Secondary | ICD-10-CM | POA: Diagnosis not present

## 2019-04-05 LAB — CBC WITH DIFFERENTIAL (CANCER CENTER ONLY)
Abs Immature Granulocytes: 0.04 10*3/uL (ref 0.00–0.07)
Basophils Absolute: 0 10*3/uL (ref 0.0–0.1)
Basophils Relative: 0 %
Eosinophils Absolute: 0 10*3/uL (ref 0.0–0.5)
Eosinophils Relative: 1 %
HCT: 48.7 % (ref 39.0–52.0)
Hemoglobin: 16.7 g/dL (ref 13.0–17.0)
Immature Granulocytes: 1 %
Lymphocytes Relative: 8 %
Lymphs Abs: 0.2 10*3/uL — ABNORMAL LOW (ref 0.7–4.0)
MCH: 28.6 pg (ref 26.0–34.0)
MCHC: 34.3 g/dL (ref 30.0–36.0)
MCV: 83.4 fL (ref 80.0–100.0)
Monocytes Absolute: 0.6 10*3/uL (ref 0.1–1.0)
Monocytes Relative: 21 %
Neutro Abs: 2.1 10*3/uL (ref 1.7–7.7)
Neutrophils Relative %: 69 %
Platelet Count: 108 10*3/uL — ABNORMAL LOW (ref 150–400)
RBC: 5.84 MIL/uL — ABNORMAL HIGH (ref 4.22–5.81)
RDW: 12.5 % (ref 11.5–15.5)
WBC Count: 3 10*3/uL — ABNORMAL LOW (ref 4.0–10.5)
nRBC: 0 % (ref 0.0–0.2)

## 2019-04-05 LAB — CMP (CANCER CENTER ONLY)
ALT: 39 U/L (ref 0–44)
AST: 26 U/L (ref 15–41)
Albumin: 4 g/dL (ref 3.5–5.0)
Alkaline Phosphatase: 100 U/L (ref 38–126)
Anion gap: 7 (ref 5–15)
BUN: 12 mg/dL (ref 6–20)
CO2: 28 mmol/L (ref 22–32)
Calcium: 9.4 mg/dL (ref 8.9–10.3)
Chloride: 102 mmol/L (ref 98–111)
Creatinine: 0.97 mg/dL (ref 0.61–1.24)
GFR, Est AFR Am: >60 mL/min (ref >60)
GFR, Estimated: >60 mL/min (ref >60)
Glucose, Bld: 125 mg/dL — ABNORMAL HIGH (ref 70–99)
Potassium: 3.9 mmol/L (ref 3.5–5.1)
Sodium: 137 mmol/L (ref 135–145)
Total Bilirubin: 0.5 mg/dL (ref 0.3–1.2)
Total Protein: 6.2 g/dL — ABNORMAL LOW (ref 6.5–8.1)

## 2019-04-05 MED ORDER — PROCHLORPERAZINE MALEATE 10 MG PO TABS: 10.0000 mg | ORAL_TABLET | Freq: Four times a day (QID) | ORAL | 3 refills | Status: DC | PRN

## 2019-04-05 MED ORDER — PALONOSETRON HCL INJECTION 0.25 MG/5ML
0.2500 mg | Freq: Once | INTRAVENOUS | Status: AC
  Administered 2019-04-05: 0.25 mg via INTRAVENOUS

## 2019-04-05 MED ORDER — TRASTUZUMAB-ANNS CHEMO 150 MG IV SOLR
8.0000 mg/kg | Freq: Once | INTRAVENOUS | Status: AC
  Administered 2019-04-05: 714 mg via INTRAVENOUS
  Filled 2019-04-05: qty 34

## 2019-04-05 MED ORDER — ACETAMINOPHEN 325 MG PO TABS
650.0000 mg | ORAL_TABLET | Freq: Once | ORAL | Status: AC
  Administered 2019-04-05: 650 mg via ORAL

## 2019-04-05 MED ORDER — ACETAMINOPHEN 325 MG PO TABS
ORAL_TABLET | ORAL | Status: AC
  Filled 2019-04-05: qty 2

## 2019-04-05 MED ORDER — SODIUM CHLORIDE 0.9 % IV SOLN
800.0000 mg/m2/d | INTRAVENOUS | Status: DC
  Administered 2019-04-05: 6800 mg via INTRAVENOUS
  Filled 2019-04-05: qty 136

## 2019-04-05 MED ORDER — SODIUM CHLORIDE 0.9 % IV SOLN
Freq: Once | INTRAVENOUS | Status: AC
  Filled 2019-04-05: qty 250

## 2019-04-05 MED ORDER — DIPHENHYDRAMINE HCL 25 MG PO CAPS
50.0000 mg | ORAL_CAPSULE | Freq: Once | ORAL | Status: AC
  Administered 2019-04-05: 50 mg via ORAL

## 2019-04-05 MED ORDER — DEXAMETHASONE SODIUM PHOSPHATE 10 MG/ML IJ SOLN
10.0000 mg | Freq: Once | INTRAMUSCULAR | Status: AC
  Administered 2019-04-05: 10 mg via INTRAVENOUS

## 2019-04-05 MED ORDER — ONDANSETRON HCL 8 MG PO TABS: ORAL_TABLET | ORAL | 2 refills | Status: DC

## 2019-04-05 MED ORDER — PALONOSETRON HCL INJECTION 0.25 MG/5ML
INTRAVENOUS | Status: AC
  Filled 2019-04-05: qty 5

## 2019-04-05 MED ORDER — SODIUM CHLORIDE 0.9 % IV SOLN
150.0000 mg | Freq: Once | INTRAVENOUS | Status: AC
  Administered 2019-04-05: 150 mg via INTRAVENOUS
  Filled 2019-04-05: qty 5

## 2019-04-05 MED ORDER — POTASSIUM CHLORIDE 2 MEQ/ML IV SOLN
Freq: Once | INTRAVENOUS | Status: AC
  Filled 2019-04-05: qty 10

## 2019-04-05 MED ORDER — DEXAMETHASONE SODIUM PHOSPHATE 10 MG/ML IJ SOLN
INTRAMUSCULAR | Status: AC
  Filled 2019-04-05: qty 1

## 2019-04-05 MED ORDER — SODIUM CHLORIDE 0.9 % IV SOLN
80.0000 mg/m2 | Freq: Once | INTRAVENOUS | Status: AC
  Administered 2019-04-05: 170 mg via INTRAVENOUS
  Filled 2019-04-05: qty 170

## 2019-04-05 MED ORDER — HEPARIN SOD (PORK) LOCK FLUSH 100 UNIT/ML IV SOLN
500.0000 [IU] | Freq: Once | INTRAVENOUS | Status: DC | PRN
  Filled 2019-04-05: qty 5

## 2019-04-05 MED ORDER — DIPHENHYDRAMINE HCL 25 MG PO CAPS
ORAL_CAPSULE | ORAL | Status: AC
  Filled 2019-04-05: qty 1

## 2019-04-05 MED ORDER — SODIUM CHLORIDE 0.9% FLUSH
10.0000 mL | INTRAVENOUS | Status: DC | PRN
  Filled 2019-04-05: qty 10

## 2019-04-05 MED FILL — ONDANSETRON HCL 8 MG TABLET: 8 | 15 days supply | Qty: 30 | Fill #0

## 2019-04-05 MED FILL — PROCHLORPERAZINE 10 MG TAB: 10 | 8 days supply | Qty: 30 | Fill #0

## 2019-04-05 NOTE — Progress Notes (Signed)
Patient with weight loss.  Will not round dose of Kanjinti for vial size.  This would be outside protocol parameters of 10% difference.

## 2019-04-05 NOTE — Patient Instructions (Signed)

## 2019-04-05 NOTE — Progress Notes (Signed)
Hematology and Oncology Follow Up Visit  Matthew Reynolds [id number] 09/04/1965 54 y.o. 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 Matthew Reynolds is here today for follow-up and chemotherapy.  This to be his second last cycle of infusional chemotherapy.  He really has done nicely with the chemotherapy and radiation.  He has had no problems with significant dysphagia or odynophagia.  He has maintained his weight pretty well.  He is not complaining of pain.  He has had no mouth sores.  He has had no nausea or vomiting.  He has been very proactive with nausea and vomiting.  He has had no diarrhea.  He has had no bleeding.  There has been no melena or bright red blood per rectum.  He has had no leg swelling.  He has been very active overall.  Of note, we did get the genetic markers back from his esophageal biopsy.  The tumor is still HER-2 positive.  We also found that there is positivity for PD-L1.  Overall, his performance status is ECOG 1.    Medications:  Allergies as of 04/05/2019   No Known Allergies     Medication List       Accurate as of April 05, 2019  9:06 AM. If you have any questions, ask your nurse or doctor.        baclofen 10 MG tablet Commonly known as: LIORESAL Take 1 tablet (10 mg total) by mouth every 8 (eight) hours as needed for muscle spasms.   dexamethasone 4 MG tablet Commonly known as: DECADRON Take 2 tablets by mouth once a day on the day after chemotherapy and then take 2 tablets two times a day for 2 days. Take with food.   Dexilant 60 MG capsule Generic drug: dexlansoprazole Take 1 capsule (60  mg total) by mouth daily.   lidocaine-prilocaine cream Commonly known as: EMLA Apply 1 application topically as needed.   LORazepam 0.5 MG tablet Commonly known as: Ativan Take 1 tablet (0.5 mg total) by mouth every 6 (six) hours as needed (Nausea or vomiting).   MULTI-VITAMIN DAILY PO Take by mouth.   ondansetron 8 MG tablet Commonly known as: ZOFRAN TAKE 1 TABLET (8 MG TOTAL) BY MOUTH 2 (TWO) TIMES DAILY AS NEEDED FOR REFRACTORY NAUSEA / VOMITING. START ON DAY 3 AFTER CHEMOTHERAPY.   prochlorperazine 10 MG tablet Commonly known as: COMPAZINE Take 1 tablet (10 mg total) by mouth every 6 (six) hours as needed (Nausea or vomiting).   sucralfate 1 GM/10ML suspension Commonly known as: Carafate Take 10 mLs (1 g total) by mouth 4 (four) times daily -  with meals and at bedtime. What changed: Another medication with the same name was removed. Continue taking this medication, and follow the directions you see here. Changed by: Amelia Jo, RN       Allergies: No Known Allergies  Past Medical History, Surgical history, Social history, and Family History were reviewed and updated.  Review of Systems: Review of Systems  Constitutional: Negative.   HENT: Negative.   Eyes: Negative.   Respiratory: Negative.   Cardiovascular: Negative.  Gastrointestinal: Negative.   Genitourinary: Negative.   Musculoskeletal: Negative.   Skin: Negative.   Neurological: Positive for tingling.  Endo/Heme/Allergies: Negative.   Psychiatric/Behavioral: Negative.    Marland Kitchen   Physical Exam:  height is _0  (1.753 m) and weight is 185 lb (83.9 kg). His temporal temperature is 97.3 F (36.3 C) (abnormal). His blood pressure is 134/95 (abnormal) and his pulse is 89. His respiration is 17 and oxygen saturation is 98%.   Wt Readings from Last 3 Encounters:  04/05/19 185 lb (83.9 kg)  03/25/19 192 lb (87.1 kg)  03/05/19 202 lb 6.4 oz (91.8 kg)    Physical Exam Vitals reviewed.  HENT:     Head:  Normocephalic and atraumatic.  Eyes:     Pupils: Pupils are equal, round, and reactive to light.  Cardiovascular:     Rate and Rhythm: Normal rate and regular rhythm.     Heart sounds: Normal heart sounds.  Pulmonary:     Effort: Pulmonary effort is normal.     Breath sounds: Normal breath sounds.  Abdominal:     General: Bowel sounds are normal.     Palpations: Abdomen is soft.  Musculoskeletal:        General: No tenderness or deformity. Normal range of motion.     Cervical back: Normal range of motion.  Lymphadenopathy:     Cervical: No cervical adenopathy.  Skin:    General: Skin is warm and dry.     Findings: No erythema or rash.  Neurological:     Mental Status: He is alert and oriented to person, place, and time.  Psychiatric:        Behavior: Behavior normal.        Thought Content: Thought content normal.        Judgment: Judgment normal.      Lab Results  Component Value Date   WBC 3.0 (L) 04/05/2019   HGB 16.7 04/05/2019   HCT 48.7 04/05/2019   MCV 83.4 04/05/2019   PLT 108 (L) 04/05/2019   Lab Results  Component Value Date   FERRITIN 215 03/25/2019   IRON 92 03/25/2019   TIBC 274 03/25/2019   UIBC 182 03/25/2019   IRONPCTSAT 34 03/25/2019   Lab Results  Component Value Date   RBC 5.84 (H) 04/05/2019   No results found for: KPAFRELGTCHN, LAMBDASER, KAPLAMBRATIO No results found for: IGGSERUM, IGA, IGMSERUM No results found for: TOTALPROTELP, ALBUMINELP, A1GS, A2GS, BETS, BETA2SER, GAMS, MSPIKE, SPEI   Chemistry      Component Value Date/Time   NA 137 04/05/2019 0800   K 3.9 04/05/2019 0800   CL 102 04/05/2019 0800   CO2 28 04/05/2019 0800   BUN 12 04/05/2019 0800   CREATININE 0.97 04/05/2019 0800      Component Value Date/Time   CALCIUM 9.4 04/05/2019 0800   ALKPHOS 100 04/05/2019 0800   AST 26 04/05/2019 0800   ALT 39 04/05/2019 0800   BILITOT 0.5 04/05/2019 0800       Impression and Plan: Mr. Matthew Reynolds is a very pleasant 54 yo  caucasian gentleman with metastatic adenocarcinoma of the GE junction, HER-2 positive.   I am just very happy that his quality of life has done so well despite such an aggressive protocol.  I would like to believe that this treatment is going to eradicate the local recurrence in the lower esophagus.  We will go ahead with the second cycle of cis-platinum/5-nephew.  He also gets the Herceptin.  As far  as systemic therapy is concerned once we get done with chemo radiation therapy, we will hopefully will be able to incorporate immunotherapy.  I probably would wait at least a month or so after radiation completed before we reevaluate him with endoscopy and PET scan.  I will see him back in a month.   [name], MD 1/18/20219:06 AM

## 2019-04-05 NOTE — Patient Instructions (Signed)
Fluorouracil, 5-FU injection What is this medicine? FLUOROURACIL, 5-FU (flure oh YOOR a sil) is a chemotherapy drug. It slows the growth of cancer cells. This medicine is used to treat many types of cancer like breast cancer, colon or rectal cancer, pancreatic cancer, and stomach cancer. This medicine may be used for other purposes; ask your health care provider or pharmacist if you have questions. COMMON BRAND NAME(S): Adrucil What should I tell my health care provider before I take this medicine? They need to know if you have any of these conditions:  blood disorders  dihydropyrimidine dehydrogenase (DPD) deficiency  infection (especially a virus infection such as chickenpox, cold sores, or herpes)  kidney disease  liver disease  malnourished, poor nutrition  recent or ongoing radiation therapy  an unusual or allergic reaction to fluorouracil, other chemotherapy, other medicines, foods, dyes, or preservatives  pregnant or trying to get pregnant  breast-feeding How should I use this medicine? This drug is given as an infusion or injection into a vein. It is administered in a hospital or clinic by a specially trained health care professional. Talk to your pediatrician regarding the use of this medicine in children. Special care may be needed. Overdosage: If you think you have taken too much of this medicine contact a poison control center or emergency room at once. NOTE: This medicine is only for you. Do not share this medicine with others. What if I miss a dose? It is important not to miss your dose. Call your doctor or health care professional if you are unable to keep an appointment. What may interact with this medicine?  allopurinol  cimetidine  dapsone  digoxin  hydroxyurea  leucovorin  levamisole  medicines for seizures like ethotoin, fosphenytoin, phenytoin  medicines to increase blood counts like filgrastim, pegfilgrastim, sargramostim  medicines that  treat or prevent blood clots like warfarin, enoxaparin, and dalteparin  methotrexate  metronidazole  pyrimethamine  some other chemotherapy drugs like busulfan, cisplatin, estramustine, vinblastine  trimethoprim  trimetrexate  vaccines Talk to your doctor or health care professional before taking any of these medicines:  acetaminophen  aspirin  ibuprofen  ketoprofen  naproxen This list may not describe all possible interactions. Give your health care provider a list of all the medicines, herbs, non-prescription drugs, or dietary supplements you use. Also tell them if you smoke, drink alcohol, or use illegal drugs. Some items may interact with your medicine. What should I watch for while using this medicine? Visit your doctor for checks on your progress. This drug may make you feel generally unwell. This is not uncommon, as chemotherapy can affect healthy cells as well as cancer cells. Report any side effects. Continue your course of treatment even though you feel ill unless your doctor tells you to stop. In some cases, you may be given additional medicines to help with side effects. Follow all directions for their use. Call your doctor or health care professional for advice if you get a fever, chills or sore throat, or other symptoms of a cold or flu. Do not treat yourself. This drug decreases your body's ability to fight infections. Try to avoid being around people who are sick. This medicine may increase your risk to bruise or bleed. Call your doctor or health care professional if you notice any unusual bleeding. Be careful brushing and flossing your teeth or using a toothpick because you may get an infection or bleed more easily. If you have any dental work done, tell your dentist you are  receiving this medicine. Avoid taking products that contain aspirin, acetaminophen, ibuprofen, naproxen, or ketoprofen unless instructed by your doctor. These medicines may hide a fever. Do not  become pregnant while taking this medicine. Women should inform their doctor if they wish to become pregnant or think they might be pregnant. There is a potential for serious side effects to an unborn child. Talk to your health care professional or pharmacist for more information. Do not breast-feed an infant while taking this medicine. Men should inform their doctor if they wish to father a child. This medicine may lower sperm counts. Do not treat diarrhea with over the counter products. Contact your doctor if you have diarrhea that lasts more than 2 days or if it is severe and watery. This medicine can make you more sensitive to the sun. Keep out of the sun. If you cannot avoid being in the sun, wear protective clothing and use sunscreen. Do not use sun lamps or tanning beds/booths. What side effects may I notice from receiving this medicine? Side effects that you should report to your doctor or health care professional as soon as possible:  allergic reactions like skin rash, itching or hives, swelling of the face, lips, or tongue  low blood counts - this medicine may decrease the number of white blood cells, red blood cells and platelets. You may be at increased risk for infections and bleeding.  signs of infection - fever or chills, cough, sore throat, pain or difficulty passing urine  signs of decreased platelets or bleeding - bruising, pinpoint red spots on the skin, black, tarry stools, blood in the urine  signs of decreased red blood cells - unusually weak or tired, fainting spells, lightheadedness  breathing problems  changes in vision  chest pain  mouth sores  nausea and vomiting  pain, swelling, redness at site where injected  pain, tingling, numbness in the hands or feet  redness, swelling, or sores on hands or feet  stomach pain  unusual bleeding Side effects that usually do not require medical attention (report to your doctor or health care professional if they  continue or are bothersome):  changes in finger or toe nails  diarrhea  dry or itchy skin  hair loss  headache  loss of appetite  sensitivity of eyes to the light  stomach upset  unusually teary eyes This list may not describe all possible side effects. Call your doctor for medical advice about side effects. You may report side effects to FDA at 1-800-FDA-1088. Where should I keep my medicine? This drug is given in a hospital or clinic and will not be stored at home. NOTE: This sheet is a summary. It may not cover all possible information. If you have questions about this medicine, talk to your doctor, pharmacist, or health care provider.  2020 [employer]vier/Gold Standard (2007-07-08 13:53:16) Cisplatin injection What is this medicine? CISPLATIN (SIS pla tin) is a chemotherapy drug. It targets fast dividing cells, like cancer cells, and causes these cells to die. This medicine is used to treat many types of cancer like bladder, ovarian, and testicular cancers. This medicine may be used for other purposes; ask your health care provider or pharmacist if you have questions. COMMON BRAND NAME(S): Platinol, Platinol -AQ What should I tell my health care provider before I take this medicine? They need to know if you have any of these conditions:  eye disease, vision problems  hearing problems  kidney disease  low blood counts, like white cells, platelets, or  red blood cells  tingling of the fingers or toes, or other nerve disorder  an unusual or allergic reaction to cisplatin, carboplatin, oxaliplatin, other medicines, foods, dyes, or preservatives  pregnant or trying to get pregnant  breast-feeding How should I use this medicine? This drug is given as an infusion into a vein. It is administered in a hospital or clinic by a specially trained health care professional. Talk to your pediatrician regarding the use of this medicine in children. Special care may be  needed. Overdosage: If you think you have taken too much of this medicine contact a poison control center or emergency room at once. NOTE: This medicine is only for you. Do not share this medicine with others. What if I miss a dose? It is important not to miss a dose. Call your doctor or health care professional if you are unable to keep an appointment. What may interact with this medicine? This medicine may interact with the following medications:  foscarnet  certain antibiotics like amikacin, gentamicin, neomycin, polymyxin B, streptomycin, tobramycin, vancomycin This list may not describe all possible interactions. Give your health care provider a list of all the medicines, herbs, non-prescription drugs, or dietary supplements you use. Also tell them if you smoke, drink alcohol, or use illegal drugs. Some items may interact with your medicine. What should I watch for while using this medicine? Your condition will be monitored carefully while you are receiving this medicine. You will need important blood work done while you are taking this medicine. This drug may make you feel generally unwell. This is not uncommon, as chemotherapy can affect healthy cells as well as cancer cells. Report any side effects. Continue your course of treatment even though you feel ill unless your doctor tells you to stop. This medicine may increase your risk of getting an infection. Call your healthcare professional for advice if you get a fever, chills, or sore throat, or other symptoms of a cold or flu. Do not treat yourself. Try to avoid being around people who are sick. Avoid taking medicines that contain aspirin, acetaminophen, ibuprofen, naproxen, or ketoprofen unless instructed by your healthcare professional. These medicines may hide a fever. This medicine may increase your risk to bruise or bleed. Call your doctor or health care professional if you notice any unusual bleeding. Be careful brushing and flossing  your teeth or using a toothpick because you may get an infection or bleed more easily. If you have any dental work done, tell your dentist you are receiving this medicine. Do not become pregnant while taking this medicine or for 14 months after stopping it. Women should inform their healthcare professional if they wish to become pregnant or think they might be pregnant. Men should not father a child while taking this medicine and for 11 months after stopping it. There is potential for serious side effects to an unborn child. Talk to your healthcare professional for more information. Do not breast-feed an infant while taking this medicine. This medicine has caused ovarian failure in some women. This medicine may make it more difficult to get pregnant. Talk to your healthcare professional if you are concerned about your fertility. This medicine has caused decreased sperm counts in some men. This may make it more difficult to father a child. Talk to your healthcare professional if you are concerned about your fertility. Drink fluids as directed while you are taking this medicine. This will help protect your kidneys. Call your doctor or health care professional if you  get diarrhea. Do not treat yourself. What side effects may I notice from receiving this medicine? Side effects that you should report to your doctor or health care professional as soon as possible:  allergic reactions like skin rash, itching or hives, swelling of the face, lips, or tongue  blurred vision  changes in vision  decreased hearing or ringing of the ears  nausea, vomiting  pain, redness, or irritation at site where injected  pain, tingling, numbness in the hands or feet  signs and symptoms of bleeding such as bloody or black, tarry stools; red or dark brown urine; spitting up blood or brown material that looks like coffee grounds; red spots on the skin; unusual bruising or bleeding from the eyes, gums, or nose  signs and  symptoms of infection like fever; chills; cough; sore throat; pain or trouble passing urine  signs and symptoms of kidney injury like trouble passing urine or change in the amount of urine  signs and symptoms of low red blood cells or anemia such as unusually weak or tired; feeling faint or lightheaded; falls; breathing problems Side effects that usually do not require medical attention (report to your doctor or health care professional if they continue or are bothersome):  loss of appetite  mouth sores  muscle cramps This list may not describe all possible side effects. Call your doctor for medical advice about side effects. You may report side effects to FDA at 1-800-FDA-1088. Where should I keep my medicine? This drug is given in a hospital or clinic and will not be stored at home. NOTE: This sheet is a summary. It may not cover all possible information. If you have questions about this medicine, talk to your doctor, pharmacist, or health care provider.  2020 Elsevier/Gold Standard (2018-02-27 15:59:17)

## 2019-04-06 ENCOUNTER — Other Ambulatory Visit: Payer: Self-pay

## 2019-04-06 ENCOUNTER — Ambulatory Visit
Admission: RE | Admit: 2019-04-06 | Discharge: 2019-04-06 | Disposition: A | Discharge status: Home / Self Care | Payer: 59 | Source: Ambulatory Visit | Attending: Radiation Oncology | Admitting: Radiation Oncology

## 2019-04-06 DIAGNOSIS — Z510 Encounter for antineoplastic radiation therapy: Secondary | ICD-10-CM | POA: Diagnosis not present

## 2019-04-07 ENCOUNTER — Other Ambulatory Visit: Payer: Self-pay

## 2019-04-07 ENCOUNTER — Ambulatory Visit
Admission: RE | Admit: 2019-04-07 | Discharge: 2019-04-07 | Disposition: A | Discharge status: Home / Self Care | Payer: 59 | Source: Ambulatory Visit | Attending: Radiation Oncology | Admitting: Radiation Oncology

## 2019-04-07 DIAGNOSIS — Z510 Encounter for antineoplastic radiation therapy: Secondary | ICD-10-CM | POA: Diagnosis not present

## 2019-04-08 ENCOUNTER — Encounter: Payer: Self-pay | Admitting: Hematology & Oncology

## 2019-04-08 ENCOUNTER — Other Ambulatory Visit: Payer: Self-pay

## 2019-04-08 ENCOUNTER — Ambulatory Visit
Admission: RE | Admit: 2019-04-08 | Discharge: 2019-04-08 | Disposition: A | Discharge status: Home / Self Care | Payer: 59 | Source: Ambulatory Visit | Attending: Radiation Oncology | Admitting: Radiation Oncology

## 2019-04-08 DIAGNOSIS — Z510 Encounter for antineoplastic radiation therapy: Secondary | ICD-10-CM | POA: Diagnosis not present

## 2019-04-09 ENCOUNTER — Ambulatory Visit
Admission: RE | Admit: 2019-04-09 | Discharge: 2019-04-09 | Disposition: A | Discharge status: Home / Self Care | Payer: 59 | Source: Ambulatory Visit | Attending: Radiation Oncology | Admitting: Radiation Oncology

## 2019-04-09 ENCOUNTER — Other Ambulatory Visit: Payer: Self-pay | Admitting: Family

## 2019-04-09 ENCOUNTER — Inpatient Hospital Stay: Payer: 59

## 2019-04-09 ENCOUNTER — Encounter: Payer: Self-pay | Admitting: *Deleted

## 2019-04-09 ENCOUNTER — Other Ambulatory Visit: Payer: Self-pay

## 2019-04-09 VITALS — BP 120/88 | HR 77 | Resp 18

## 2019-04-09 DIAGNOSIS — Z7189 Other specified counseling: Secondary | ICD-10-CM

## 2019-04-09 DIAGNOSIS — C799 Secondary malignant neoplasm of unspecified site: Secondary | ICD-10-CM

## 2019-04-09 DIAGNOSIS — C160 Malignant neoplasm of cardia: Secondary | ICD-10-CM

## 2019-04-09 DIAGNOSIS — Z510 Encounter for antineoplastic radiation therapy: Secondary | ICD-10-CM | POA: Diagnosis not present

## 2019-04-09 DIAGNOSIS — D500 Iron deficiency anemia secondary to blood loss (chronic): Secondary | ICD-10-CM

## 2019-04-09 MED ORDER — HEPARIN SOD (PORK) LOCK FLUSH 100 UNIT/ML IV SOLN
500.0000 [IU] | Freq: Once | INTRAVENOUS | Status: AC | PRN
  Administered 2019-04-09: 15:00:00 500 [IU]
  Filled 2019-04-09: qty 5

## 2019-04-09 MED ORDER — SODIUM CHLORIDE 0.9% FLUSH
10.0000 mL | INTRAVENOUS | Status: DC | PRN
  Administered 2019-04-09: 10 mL
  Filled 2019-04-09: qty 10

## 2019-04-09 MED ORDER — SODIUM CHLORIDE 0.9 % IV SOLN
INTRAVENOUS | Status: DC
  Filled 2019-04-09 (×2): qty 250

## 2019-04-09 MED ORDER — TELMISARTAN 40 MG PO TABS: 40.0000 mg | ORAL_TABLET | Freq: Every day | ORAL | 1 refills | Status: DC

## 2019-04-09 MED FILL — TELMISARTAN 40 MG TABLET: 40 | 30 days supply | Qty: 30 | Fill #0

## 2019-04-09 NOTE — Progress Notes (Signed)
[name] notified of patient's BP-148/[id number].  Order received for pt to take Micardis 40 mg daily.  Pt notified and requests that prescription be sent to [place].

## 2019-04-09 NOTE — Patient Instructions (Signed)

## 2019-04-12 ENCOUNTER — Other Ambulatory Visit: Payer: Self-pay

## 2019-04-12 ENCOUNTER — Encounter: Payer: Self-pay | Admitting: Hematology & Oncology

## 2019-04-12 ENCOUNTER — Ambulatory Visit
Admission: RE | Admit: 2019-04-12 | Discharge: 2019-04-12 | Disposition: A | Discharge status: Home / Self Care | Payer: 59 | Source: Ambulatory Visit | Attending: Radiation Oncology | Admitting: Radiation Oncology

## 2019-04-12 DIAGNOSIS — Z510 Encounter for antineoplastic radiation therapy: Secondary | ICD-10-CM | POA: Diagnosis not present

## 2019-04-13 ENCOUNTER — Other Ambulatory Visit: Payer: Self-pay

## 2019-04-13 ENCOUNTER — Inpatient Hospital Stay: Payer: 59 | Admitting: Nutrition

## 2019-04-13 ENCOUNTER — Telehealth: Payer: Self-pay | Admitting: Nutrition

## 2019-04-13 ENCOUNTER — Ambulatory Visit
Admission: RE | Admit: 2019-04-13 | Discharge: 2019-04-13 | Disposition: A | Discharge status: Home / Self Care | Payer: 59 | Source: Ambulatory Visit | Attending: Radiation Oncology | Admitting: Radiation Oncology

## 2019-04-13 DIAGNOSIS — Z510 Encounter for antineoplastic radiation therapy: Secondary | ICD-10-CM | POA: Diagnosis not present

## 2019-04-13 NOTE — Progress Notes (Signed)
See telephone note.

## 2019-04-13 NOTE — Telephone Encounter (Signed)
Patient was identified to be at risk for malnutrition on the MST secondary to wt loss and poor appetite.  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 He is a patient of [name] and Dr. Sondra Come receiving concurrent chemoradiation treatments.  PMH includes anemia  Medications include Ativan, MVI, Zofran, Compazine, and Carafate.  Labs include Glucose 125.  Height: 69 inches Weight: 185 pounds. UBW: 210 pounds BMI: 27.32.  Spoke with patient and wife over the telephone. Patient has developed difficulty swallowing. States he does not tolerate soft foods anymore and he is having difficulty swallowing thicker liquids. Patient worried about getting enough calories, protein and fluids. Reports he has dry heaves and abdominal pain after trying to swallow foods. Has consumed CIB, Juices, and Gatorade.  Nutrition diagnosis: Unintended weight loss related to cancer and associated treatments as evidenced by 25 pound weight loss over 2 months.  Intervention: Educated patient on sipping on calorie and protein dense thin liquids all day. Educated to thin thicker liquids with milk for easier swallowing. Recommended adding protein powder and trying Unjury chicken soup. Consider regular IVF to help prevent dehydration. Will provide samples, fact sheets and contact information. Questions answered and teach back method used. Consider feeding tube if dysphagia doesn't improve and POC warrants.  Monitoring, Evaluation, Goals: Pt will increase calories and protein to minimize further wt loss  Next Visit: To be scheduled as needed.

## 2019-04-14 ENCOUNTER — Inpatient Hospital Stay: Payer: 59

## 2019-04-14 ENCOUNTER — Ambulatory Visit
Admission: RE | Admit: 2019-04-14 | Discharge: 2019-04-14 | Disposition: A | Discharge status: Home / Self Care | Payer: 59 | Source: Ambulatory Visit | Attending: Radiation Oncology | Admitting: Radiation Oncology

## 2019-04-14 ENCOUNTER — Other Ambulatory Visit: Payer: Self-pay

## 2019-04-14 VITALS — BP 101/70 | HR 70 | Temp 97.1°F | Resp 16

## 2019-04-14 DIAGNOSIS — D500 Iron deficiency anemia secondary to blood loss (chronic): Secondary | ICD-10-CM

## 2019-04-14 DIAGNOSIS — Z510 Encounter for antineoplastic radiation therapy: Secondary | ICD-10-CM | POA: Diagnosis not present

## 2019-04-14 DIAGNOSIS — C799 Secondary malignant neoplasm of unspecified site: Secondary | ICD-10-CM

## 2019-04-14 DIAGNOSIS — C160 Malignant neoplasm of cardia: Secondary | ICD-10-CM

## 2019-04-14 MED ORDER — SODIUM CHLORIDE 0.9 % IV SOLN
INTRAVENOUS | Status: DC
  Filled 2019-04-14 (×2): qty 250

## 2019-04-14 MED ORDER — SODIUM CHLORIDE 0.9% FLUSH
10.0000 mL | Freq: Once | INTRAVENOUS | Status: AC | PRN
  Administered 2019-04-14: 10 mL
  Filled 2019-04-14: qty 10

## 2019-04-14 MED ORDER — HEPARIN SOD (PORK) LOCK FLUSH 100 UNIT/ML IV SOLN
500.0000 [IU] | Freq: Once | INTRAVENOUS | Status: AC | PRN
  Administered 2019-04-14: 500 [IU]
  Filled 2019-04-14: qty 5

## 2019-04-14 NOTE — Patient Instructions (Signed)

## 2019-04-15 ENCOUNTER — Other Ambulatory Visit: Payer: Self-pay

## 2019-04-15 ENCOUNTER — Ambulatory Visit
Admission: RE | Admit: 2019-04-15 | Discharge: 2019-04-15 | Disposition: A | Discharge status: Home / Self Care | Payer: 59 | Source: Ambulatory Visit | Attending: Radiation Oncology | Admitting: Radiation Oncology

## 2019-04-15 ENCOUNTER — Encounter: Payer: Self-pay | Admitting: Hematology & Oncology

## 2019-04-15 ENCOUNTER — Ambulatory Visit: Payer: 59

## 2019-04-15 DIAGNOSIS — Z510 Encounter for antineoplastic radiation therapy: Secondary | ICD-10-CM | POA: Diagnosis not present

## 2019-04-16 ENCOUNTER — Other Ambulatory Visit: Payer: Self-pay

## 2019-04-16 ENCOUNTER — Ambulatory Visit
Admission: RE | Admit: 2019-04-16 | Discharge: 2019-04-16 | Disposition: A | Discharge status: Home / Self Care | Payer: 59 | Source: Ambulatory Visit | Attending: Radiation Oncology | Admitting: Radiation Oncology

## 2019-04-16 ENCOUNTER — Ambulatory Visit: Payer: 59

## 2019-04-16 ENCOUNTER — Inpatient Hospital Stay: Payer: 59

## 2019-04-16 VITALS — BP 111/70 | HR 80 | Temp 97.0°F | Resp 17

## 2019-04-16 DIAGNOSIS — C160 Malignant neoplasm of cardia: Secondary | ICD-10-CM

## 2019-04-16 DIAGNOSIS — D500 Iron deficiency anemia secondary to blood loss (chronic): Secondary | ICD-10-CM

## 2019-04-16 DIAGNOSIS — C799 Secondary malignant neoplasm of unspecified site: Secondary | ICD-10-CM

## 2019-04-16 DIAGNOSIS — Z510 Encounter for antineoplastic radiation therapy: Secondary | ICD-10-CM | POA: Diagnosis not present

## 2019-04-16 MED ORDER — HEPARIN SOD (PORK) LOCK FLUSH 100 UNIT/ML IV SOLN
500.0000 [IU] | Freq: Once | INTRAVENOUS | Status: DC | PRN
  Filled 2019-04-16: qty 5

## 2019-04-16 MED ORDER — DEXILANT 60 MG PO CPDR: 60.0000 mg | DELAYED_RELEASE_CAPSULE | Freq: Every day | ORAL | 6 refills | Status: DC

## 2019-04-16 MED ORDER — SODIUM CHLORIDE 0.9% FLUSH
10.0000 mL | Freq: Once | INTRAVENOUS | Status: DC | PRN
  Filled 2019-04-16: qty 10

## 2019-04-16 MED ORDER — SODIUM CHLORIDE 0.9 % IV SOLN
INTRAVENOUS | Status: DC
  Filled 2019-04-16 (×2): qty 250

## 2019-04-16 NOTE — Patient Instructions (Signed)

## 2019-04-19 ENCOUNTER — Ambulatory Visit
Admission: RE | Admit: 2019-04-19 | Discharge: 2019-04-19 | Disposition: A | Discharge status: Home / Self Care | Payer: 59 | Source: Ambulatory Visit | Attending: Radiation Oncology | Admitting: Radiation Oncology

## 2019-04-19 ENCOUNTER — Other Ambulatory Visit: Payer: Self-pay

## 2019-04-19 ENCOUNTER — Inpatient Hospital Stay: Payer: 59 | Attending: Hematology & Oncology

## 2019-04-19 ENCOUNTER — Ambulatory Visit: Payer: 59

## 2019-04-19 VITALS — BP 118/73 | HR 85 | Temp 97.8°F | Resp 20

## 2019-04-19 DIAGNOSIS — Z79899 Other long term (current) drug therapy: Secondary | ICD-10-CM | POA: Diagnosis not present

## 2019-04-19 DIAGNOSIS — C799 Secondary malignant neoplasm of unspecified site: Secondary | ICD-10-CM

## 2019-04-19 DIAGNOSIS — D500 Iron deficiency anemia secondary to blood loss (chronic): Secondary | ICD-10-CM

## 2019-04-19 DIAGNOSIS — Z5112 Encounter for antineoplastic immunotherapy: Secondary | ICD-10-CM | POA: Diagnosis not present

## 2019-04-19 DIAGNOSIS — C160 Malignant neoplasm of cardia: Secondary | ICD-10-CM | POA: Diagnosis present

## 2019-04-19 DIAGNOSIS — Z510 Encounter for antineoplastic radiation therapy: Secondary | ICD-10-CM | POA: Diagnosis present

## 2019-04-19 MED ORDER — ONDANSETRON HCL 4 MG/2ML IJ SOLN
8.0000 mg | Freq: Once | INTRAMUSCULAR | Status: AC
  Administered 2019-04-19: 13:00:00 8 mg via INTRAVENOUS

## 2019-04-19 MED ORDER — SODIUM CHLORIDE 0.9 % IV SOLN
INTRAVENOUS | Status: DC
  Filled 2019-04-19 (×2): qty 250

## 2019-04-19 MED ORDER — HEPARIN SOD (PORK) LOCK FLUSH 100 UNIT/ML IV SOLN
500.0000 [IU] | Freq: Once | INTRAVENOUS | Status: AC
  Administered 2019-04-19: 14:00:00 500 [IU] via INTRAVENOUS
  Filled 2019-04-19: qty 5

## 2019-04-19 MED ORDER — ONDANSETRON HCL 4 MG/2ML IJ SOLN
INTRAMUSCULAR | Status: AC
  Filled 2019-04-19: qty 4

## 2019-04-19 MED ORDER — SODIUM CHLORIDE 0.9% FLUSH
10.0000 mL | INTRAVENOUS | Status: DC | PRN
  Administered 2019-04-19: 14:00:00 10 mL via INTRAVENOUS
  Filled 2019-04-19: qty 10

## 2019-04-19 NOTE — Patient Instructions (Addendum)
Dehydration, Adult Dehydration is condition in which there is not enough water or other fluids in the body. This happens when a person loses more fluids than he or she takes in. Important body parts cannot work right without the right amount of fluids. Any loss of fluids from the body can cause dehydration. Dehydration can be mild, worse, or very bad. It should be treated right away to keep it from getting very bad. What are the causes? This condition may be caused by:  Conditions that cause loss of water or other fluids, such as: ? Watery poop (diarrhea). ? Vomiting. ? Sweating a lot. ? Peeing (urinating) a lot.  Not drinking enough fluids, especially when you: ? Are ill. ? Are doing things that take a lot of energy to do.  Other illnesses and conditions, such as fever or infection.  Certain medicines, such as medicines that take extra fluid out of the body (diuretics).  Lack of safe drinking water.  Not being able to get enough water and food. What increases the risk? The following factors may make you more likely to develop this condition:  Having a long-term (chronic) illness that has not been treated the right way, such as: ? Diabetes. ? Heart disease. ? Kidney disease.  Being 54 years of age or older.  Having a disability.  Living in a place that is high above the ground or sea (high in altitude). The thinner, dried air causes more fluid loss.  Doing exercises that put stress on your body for a long time. What are the signs or symptoms? Symptoms of dehydration depend on how bad it is. Mild or worse dehydration  Thirst.  Dry lips or dry mouth.  Feeling dizzy or light-headed, especially when you stand up from sitting.  Muscle cramps.  Your body making: ? Dark pee (urine). Pee may be the color of tea. ? Less pee than normal. ? Less tears than normal.  Headache. Very bad dehydration  Changes in skin. Skin may: ? Be cold to the touch (clammy). ? Be blotchy  or pale. ? Not go back to normal right after you lightly pinch it and let it go.  Little or no tears, pee, or sweat.  Changes in vital signs, such as: ? Fast breathing. ? Low blood pressure. ? Weak pulse. ? Pulse that is more than 100 beats a minute when you are sitting still.  Other changes, such as: ? Feeling very thirsty. ? Eyes that look hollow (sunken). ? Cold hands and feet. ? Being mixed up (confused). ? Being very tired (lethargic) or having trouble waking from sleep. ? Short-term weight loss. ? Loss of consciousness. How is this treated? Treatment for this condition depends on how bad it is. Treatment should start right away. Do not wait until your condition gets very bad. Very bad dehydration is an emergency. You will need to go to a hospital.  Mild or worse dehydration can be treated at home. You may be asked to: ? Drink more fluids. ? Drink an oral rehydration solution (ORS). This drink helps get the right amounts of fluids and salts and minerals in the blood (electrolytes).  Very bad dehydration can be treated: ? With fluids through an IV tube. ? By getting normal levels of salts and minerals in your blood. This is often done by giving salts and minerals through a tube. The tube is passed through your nose and into your stomach. ? By treating the root cause. Follow these instructions at  home: Oral rehydration solution If told by your doctor, drink an ORS:  Make an ORS. Use instructions on the package.  Start by drinking small amounts, about  cup (120 mL) every 5-10 minutes.  Slowly drink more until you have had the amount that your doctor said to have. Eating and drinking         Drink enough clear fluid to keep your pee pale yellow. If you were told to drink an ORS, finish the ORS first. Then, start slowly drinking other clear fluids. Drink fluids such as: ? Water. Do not drink only water. Doing that can make the salt (sodium) level in your body get too  low. ? Water from ice chips you suck on. ? Fruit juice that you have added water to (diluted). ? Low-calorie sports drinks.  Eat foods that have the right amounts of salts and minerals, such as: ? Bananas. ? Oranges. ? Potatoes. ? Tomatoes. ? Spinach.  Do not drink alcohol.  Avoid: ? Drinks that have a lot of sugar. These include:  High-calorie sports drinks.  Fruit juice that you did not add water to.  Soda.  Caffeine. ? Foods that are greasy or have a lot of fat or sugar. General instructions  Take over-the-counter and prescription medicines only as told by your doctor.  Do not take salt tablets. Doing that can make the salt level in your body get too high.  Return to your normal activities as told by your doctor. Ask your doctor what activities are safe for you.  Keep all follow-up visits as told by your doctor. This is important. Contact a doctor if:  You have pain in your belly (abdomen) and the pain: ? Gets worse. ? Stays in one place.  You have a rash.  You have a stiff neck.  You get angry or annoyed (irritable) more easily than normal.  You are more tired or have a harder time waking than normal.  You feel: ? Weak or dizzy. ? Very thirsty. Get help right away if you have:  Any symptoms of very bad dehydration.  Symptoms of vomiting, such as: ? You cannot eat or drink without vomiting. ? Your vomiting gets worse or does not go away. ? Your vomit has blood or green stuff in it.  Symptoms that get worse with treatment.  A fever.  A very bad headache.  Problems with peeing or pooping (having a bowel movement), such as: ? Watery poop that gets worse or does not go away. ? Blood in your poop (stool). This may cause poop to look black and tarry. ? Not peeing in 6-8 hours. ? Peeing only a small amount of very dark pee in 6-8 hours.  Trouble breathing. These symptoms may be an emergency. Do not wait to see if the symptoms will go away. Get  medical help right away. Call your local emergency services (911 in the [place] Do not drive yourself to the hospital. Summary  Dehydration is a condition in which there is not enough water or other fluids in the body. This happens when a person loses more fluids than he or she takes in.  Treatment for this condition depends on how bad it is. Treatment should be started right away. Do not wait until your condition gets very bad.  Drink enough clear fluid to keep your pee pale yellow. If you were told to drink an oral rehydration solution (ORS), finish the ORS first. Then, start slowly drinking other clear fluids.  Take over-the-counter and prescription medicines only as told by your doctor.  Get help right away if you have any symptoms of very bad dehydration. This information is not intended to replace advice given to you by your health care provider. Make sure you discuss any questions you have with your health care provider. Document Revised: 10/15/2018 Document Reviewed: 10/15/2018 Elsevier Patient Education  [employer]. Ondansetron injection What is this medicine? ONDANSETRON (on DAN se tron) is used to treat nausea and vomiting caused by chemotherapy. It is also used to prevent or treat nausea and vomiting after surgery. This medicine may be used for other purposes; ask your health care provider or pharmacist if you have questions. COMMON BRAND NAME(S): Zofran What should I tell my health care provider before I take this medicine? They need to know if you have any of these conditions:  heart disease  history of irregular heartbeat  liver disease  low levels of magnesium or potassium in the blood  an unusual or allergic reaction to ondansetron, granisetron, other medicines, foods, dyes, or preservatives  pregnant or trying to get pregnant  breast-feeding How should I use this medicine? This medicine is for infusion into a vein. It is given by a health care  professional in a hospital or clinic setting. Talk to your pediatrician regarding the use of this medicine in children. Special care may be needed. Overdosage: If you think you have taken too much of this medicine contact a poison control center or emergency room at once. NOTE: This medicine is only for you. Do not share this medicine with others. What if I miss a dose? This does not apply. What may interact with this medicine? Do not take this medicine with any of the following medications:  apomorphine  certain medicines for fungal infections like fluconazole, itraconazole, ketoconazole, posaconazole, voriconazole  cisapride  dronedarone  pimozide  thioridazine This medicine may also interact with the following medications:  carbamazepine  certain medicines for depression, anxiety, or psychotic disturbances  fentanyl  linezolid  MAOIs like Carbex, Eldepryl, Marplan, Nardil, and Parnate  methylene blue (injected into a vein)  other medicines that prolong the QT interval (cause an abnormal heart rhythm) like dofetilide, ziprasidone  phenytoin  rifampicin  tramadol This list may not describe all possible interactions. Give your health care provider a list of all the medicines, herbs, non-prescription drugs, or dietary supplements you use. Also tell them if you smoke, drink alcohol, or use illegal drugs. Some items may interact with your medicine. What should I watch for while using this medicine? Your condition will be monitored carefully while you are receiving this medicine. What side effects may I notice from receiving this medicine? Side effects that you should report to your doctor or health care professional as soon as possible:  allergic reactions like skin rash, itching or hives, swelling of the face, lips, or tongue  breathing problems  confusion  dizziness  fast or irregular heartbeat  feeling faint or lightheaded, falls  fever and chills  loss of  balance or coordination  seizures  sweating  swelling of the hands and feet  tightness in the chest  tremors  unusually weak or tired Side effects that usually do not require medical attention (report to your doctor or health care professional if they continue or are bothersome):  constipation or diarrhea  headache This list may not describe all possible side effects. Call your doctor for medical advice about side effects. You may report side effects  to FDA at 1-800-FDA-1088. Where should I keep my medicine? This drug is given in a hospital or clinic and will not be stored at home. NOTE: This sheet is a summary. It may not cover all possible information. If you have questions about this medicine, talk to your doctor, pharmacist, or health care provider.  2020 Elsevier/Gold Standard (2018-02-24 07:12:42)

## 2019-04-19 NOTE — Progress Notes (Signed)
Pt complaining of slight nausea.  [name] notified and order received to give patient Zofran 8 mg IV and Decadron 10 mg IV now for nausea.  Pt requests to not take Decadron at this time d/t increased hiccoughs with decadron.  [name] notified.

## 2019-04-20 ENCOUNTER — Encounter: Payer: Self-pay | Admitting: Radiation Oncology

## 2019-04-20 ENCOUNTER — Ambulatory Visit
Admission: RE | Admit: 2019-04-20 | Discharge: 2019-04-20 | Disposition: A | Discharge status: Home / Self Care | Payer: 59 | Source: Ambulatory Visit | Attending: Radiation Oncology | Admitting: Radiation Oncology

## 2019-04-20 ENCOUNTER — Ambulatory Visit: Payer: 59

## 2019-04-20 ENCOUNTER — Other Ambulatory Visit: Payer: Self-pay

## 2019-04-20 DIAGNOSIS — Z510 Encounter for antineoplastic radiation therapy: Secondary | ICD-10-CM | POA: Diagnosis not present

## 2019-04-21 ENCOUNTER — Inpatient Hospital Stay: Payer: 59

## 2019-04-21 ENCOUNTER — Other Ambulatory Visit: Payer: Self-pay | Admitting: Family

## 2019-04-21 VITALS — BP 110/70 | HR 70 | Temp 98.3°F | Resp 17

## 2019-04-21 DIAGNOSIS — C160 Malignant neoplasm of cardia: Secondary | ICD-10-CM

## 2019-04-21 DIAGNOSIS — D500 Iron deficiency anemia secondary to blood loss (chronic): Secondary | ICD-10-CM

## 2019-04-21 DIAGNOSIS — R112 Nausea with vomiting, unspecified: Secondary | ICD-10-CM

## 2019-04-21 MED ORDER — SODIUM CHLORIDE 0.9% FLUSH
3.0000 mL | Freq: Once | INTRAVENOUS | Status: AC | PRN
  Administered 2019-04-21: 10 mL
  Filled 2019-04-21: qty 10

## 2019-04-21 MED ORDER — ONDANSETRON HCL 4 MG/2ML IJ SOLN
INTRAMUSCULAR | Status: AC
  Filled 2019-04-21: qty 4

## 2019-04-21 MED ORDER — ALTEPLASE 2 MG IJ SOLR
2.0000 mg | Freq: Once | INTRAMUSCULAR | Status: DC | PRN
  Filled 2019-04-21: qty 2

## 2019-04-21 MED ORDER — SODIUM CHLORIDE 0.9 % IV SOLN
INTRAVENOUS | Status: DC
  Filled 2019-04-21 (×2): qty 250

## 2019-04-21 MED ORDER — HEPARIN SOD (PORK) LOCK FLUSH 100 UNIT/ML IV SOLN
500.0000 [IU] | Freq: Once | INTRAVENOUS | Status: AC | PRN
  Administered 2019-04-21: 500 [IU]
  Filled 2019-04-21: qty 5

## 2019-04-21 MED ORDER — SODIUM CHLORIDE 0.9 % IV SOLN
Freq: Once | INTRAVENOUS | Status: DC
  Filled 2019-04-21: qty 250

## 2019-04-21 MED ORDER — HEPARIN SOD (PORK) LOCK FLUSH 100 UNIT/ML IV SOLN
250.0000 [IU] | Freq: Once | INTRAVENOUS | Status: DC | PRN
  Filled 2019-04-21: qty 5

## 2019-04-21 MED ORDER — ONDANSETRON 8 MG PO TBDP: 8.0000 mg | ORAL_TABLET | Freq: Two times a day (BID) | ORAL | 1 refills | Status: DC | PRN

## 2019-04-21 MED ORDER — ONDANSETRON HCL 4 MG/2ML IJ SOLN
8.0000 mg | Freq: Once | INTRAMUSCULAR | Status: AC
  Administered 2019-04-21: 8 mg via INTRAVENOUS

## 2019-04-21 MED FILL — ONDANSETRON ODT 8 MG TABLET: 8 | 10 days supply | Qty: 20 | Fill #0

## 2019-04-21 NOTE — Patient Instructions (Signed)
Dehydration, Adult Dehydration is a condition in which there is not enough water or other fluids in the body. This happens when a person loses more fluids than he or she takes in. Important organs, such as the kidneys, brain, and heart, cannot function without a proper amount of fluids. Any loss of fluids from the body can lead to dehydration. Dehydration can be mild, moderate, or severe. It should be treated right away to prevent it from becoming severe. What are the causes? Dehydration may be caused by:  Conditions that cause loss of water or other fluids, such as diarrhea, vomiting, or sweating or urinating a lot.  Not drinking enough fluids, especially when you are ill or doing activities that require a lot of energy.  Other illnesses and conditions, such as fever or infection.  Certain medicines, such as medicines that remove excess fluid from the body (diuretics).  Lack of safe drinking water.  Not being able to get enough water and food. What increases the risk? The following factors may make you more likely to develop this condition:  Having a long-term (chronic) illness that has not been treated properly, such as diabetes, heart disease, or kidney disease.  Being 65 years of age or older.  Having a disability.  Living in a place that is high in altitude, where thinner, drier air causes more fluid loss.  Doing exercises that put stress on your body for a long time (endurance sports). What are the signs or symptoms? Symptoms of dehydration depend on how severe it is. Mild or moderate dehydration  Thirst.  Dry lips or dry mouth.  Dizziness or light-headedness, especially when standing up from a seated position.  Muscle cramps.  Dark urine. Urine may be the color of tea.  Less urine or tears produced than usual.  Headache. Severe dehydration  Changes in skin. Your skin may be cold and clammy, blotchy, or pale. Your skin also may not return to normal after being  lightly pinched and released.  Little or no tears, urine, or sweat.  Changes in vital signs, such as rapid breathing and low blood pressure. Your pulse may be weak or may be faster than 100 beats a minute when you are sitting still.  Other changes, such as: ? Feeling very thirsty. ? Sunken eyes. ? Cold hands and feet. ? Confusion. ? Being very tired (lethargic) or having trouble waking from sleep. ? Short-term weight loss. ? Loss of consciousness. How is this diagnosed? This condition is diagnosed based on your symptoms and a physical exam. You may have blood and urine tests to help confirm the diagnosis. How is this treated? Treatment for this condition depends on how severe it is. Treatment should be started right away. Do not wait until dehydration becomes severe. Severe dehydration is an emergency and needs to be treated in a hospital.  Mild or moderate dehydration can be treated at home. You may be asked to: ? Drink more fluids. ? Drink an oral rehydration solution (ORS). This drink helps restore proper amounts of fluids and salts and minerals in the blood (electrolytes).  Severe dehydration can be treated: ? With IV fluids. ? By correcting abnormal levels of electrolytes. This is often done by giving electrolytes through a tube that is passed through your nose and into your stomach (nasogastric tube, or NG tube). ? By treating the underlying cause of dehydration. Follow these instructions at home: Oral rehydration solution If told by your health care provider, drink an ORS:  Make   an ORS by following instructions on the package.  Start by drinking small amounts, about  cup (120 mL) every 5-10 minutes.  Slowly increase how much you drink until you have taken the amount recommended by your health care provider. Eating and drinking         Drink enough clear fluid to keep your urine pale yellow. If you were told to drink an ORS, finish the ORS first and then start slowly  drinking other clear fluids. Drink fluids such as: ? Water. Do not drink only water. Doing that can lead to hyponatremia, which is having too little salt (sodium) in the body. ? Water from ice chips you suck on. ? Fruit juice that you have added water to (diluted fruit juice). ? Low-calorie sports drinks.  Eat foods that contain a healthy balance of electrolytes, such as bananas, oranges, potatoes, tomatoes, and spinach.  Do not drink alcohol.  Avoid the following: ? Drinks that contain a lot of sugar. These include high-calorie sports drinks, fruit juice that is not diluted, and soda. ? Caffeine. ? Foods that are greasy or contain a lot of fat or sugar. General instructions  Take over-the-counter and prescription medicines only as told by your health care provider.  Do not take sodium tablets. Doing that can lead to having too much sodium in the body (hypernatremia).  Return to your normal activities as told by your health care provider. Ask your health care provider what activities are safe for you.  Keep all follow-up visits as told by your health care provider. This is important. Contact a health care provider if:  You have muscle cramps, pain, or discomfort, such as: ? Pain in your abdomen and the pain gets worse or stays in one area (localizes). ? Stiff neck.  You have a rash.  You are more irritable than usual.  You are sleepier or have a harder time waking than usual.  You feel weak or dizzy.  You feel very thirsty. Get help right away if you have:  Any symptoms of severe dehydration.  Symptoms of vomiting, such as: ? You cannot eat or drink without vomiting. ? Vomiting gets worse or does not go away. ? Vomit includes blood or green matter (bile).  Symptoms that get worse with treatment.  A fever.  A severe headache.  Problems with urination or bowel movements, such as: ? Diarrhea that gets worse or does not go away. ? Blood in your stool (feces). This  may cause stool to look black and tarry. ? Not urinating, or urinating only a small amount of very dark urine, within 6-8 hours.  Trouble breathing. These symptoms may represent a serious problem that is an emergency. Do not wait to see if the symptoms will go away. Get medical help right away. Call your local emergency services (911 in the [place] Do not drive yourself to the hospital. Summary  Dehydration is a condition in which there is not enough water or other fluids in the body. This happens when a person loses more fluids than he or she takes in.  Treatment for this condition depends on how severe it is. Treatment should be started right away. Do not wait until dehydration becomes severe.  Drink enough clear fluid to keep your urine pale yellow. If you were told to drink an oral rehydration solution (ORS), finish the ORS first and then start slowly drinking other clear fluids.  Take over-the-counter and prescription medicines only as told by your health care   provider.  Get help right away if you have any symptoms of severe dehydration. This information is not intended to replace advice given to you by your health care provider. Make sure you discuss any questions you have with your health care provider. Document Revised: 10/15/2018 Document Reviewed: 10/15/2018 Elsevier Patient Education  2020 Elsevier Inc[employer]   

## 2019-04-23 ENCOUNTER — Inpatient Hospital Stay: Payer: 59

## 2019-04-23 ENCOUNTER — Other Ambulatory Visit: Payer: Self-pay

## 2019-04-23 VITALS — BP 102/61 | HR 59 | Temp 97.1°F | Resp 17

## 2019-04-23 DIAGNOSIS — C160 Malignant neoplasm of cardia: Secondary | ICD-10-CM

## 2019-04-23 DIAGNOSIS — D500 Iron deficiency anemia secondary to blood loss (chronic): Secondary | ICD-10-CM

## 2019-04-23 MED ORDER — ONDANSETRON HCL 4 MG/2ML IJ SOLN
INTRAMUSCULAR | Status: AC
  Filled 2019-04-23: qty 4

## 2019-04-23 MED ORDER — HEPARIN SOD (PORK) LOCK FLUSH 100 UNIT/ML IV SOLN
500.0000 [IU] | Freq: Once | INTRAVENOUS | Status: AC | PRN
  Administered 2019-04-23: 500 [IU]
  Filled 2019-04-23: qty 5

## 2019-04-23 MED ORDER — ONDANSETRON HCL 4 MG/2ML IJ SOLN
8.0000 mg | Freq: Once | INTRAMUSCULAR | Status: AC
  Administered 2019-04-23: 8 mg via INTRAVENOUS

## 2019-04-23 MED ORDER — SODIUM CHLORIDE 0.9 % IV SOLN
INTRAVENOUS | Status: DC
  Filled 2019-04-23 (×2): qty 250

## 2019-04-23 NOTE — Patient Instructions (Signed)

## 2019-04-26 ENCOUNTER — Ambulatory Visit: Payer: 59

## 2019-04-26 ENCOUNTER — Inpatient Hospital Stay: Payer: 59

## 2019-04-26 ENCOUNTER — Other Ambulatory Visit: Payer: Self-pay

## 2019-04-26 ENCOUNTER — Ambulatory Visit: Payer: 59 | Admitting: Hematology & Oncology

## 2019-04-26 ENCOUNTER — Other Ambulatory Visit: Payer: 59

## 2019-04-26 VITALS — BP 115/69 | HR 104 | Temp 97.8°F | Resp 20

## 2019-04-26 DIAGNOSIS — C160 Malignant neoplasm of cardia: Secondary | ICD-10-CM

## 2019-04-26 DIAGNOSIS — D500 Iron deficiency anemia secondary to blood loss (chronic): Secondary | ICD-10-CM

## 2019-04-26 MED ORDER — HEPARIN SOD (PORK) LOCK FLUSH 100 UNIT/ML IV SOLN
500.0000 [IU] | Freq: Once | INTRAVENOUS | Status: AC
  Administered 2019-04-26: 500 [IU] via INTRAVENOUS
  Filled 2019-04-26: qty 5

## 2019-04-26 MED ORDER — ONDANSETRON HCL 4 MG/2ML IJ SOLN
8.0000 mg | Freq: Once | INTRAMUSCULAR | Status: AC
  Administered 2019-04-26: 8 mg via INTRAVENOUS

## 2019-04-26 MED ORDER — SODIUM CHLORIDE 0.9 % IV SOLN
INTRAVENOUS | Status: DC
  Filled 2019-04-26 (×2): qty 250

## 2019-04-26 MED ORDER — SODIUM CHLORIDE 0.9 % IV SOLN
Freq: Once | INTRAVENOUS | Status: DC
  Filled 2019-04-26: qty 250

## 2019-04-26 MED ORDER — ALTEPLASE 2 MG IJ SOLR
2.0000 mg | Freq: Once | INTRAMUSCULAR | Status: DC | PRN
  Filled 2019-04-26: qty 2

## 2019-04-26 MED ORDER — SODIUM CHLORIDE 0.9% FLUSH
10.0000 mL | INTRAVENOUS | Status: DC | PRN
  Administered 2019-04-26: 10 mL via INTRAVENOUS
  Filled 2019-04-26: qty 10

## 2019-04-26 MED ORDER — ONDANSETRON HCL 4 MG/2ML IJ SOLN
INTRAMUSCULAR | Status: AC
  Filled 2019-04-26: qty 4

## 2019-04-26 MED ORDER — HEPARIN SOD (PORK) LOCK FLUSH 100 UNIT/ML IV SOLN
250.0000 [IU] | Freq: Once | INTRAVENOUS | Status: DC | PRN
  Filled 2019-04-26: qty 5

## 2019-04-26 MED ORDER — SODIUM CHLORIDE 0.9% FLUSH
3.0000 mL | Freq: Once | INTRAVENOUS | Status: DC | PRN
  Filled 2019-04-26: qty 10

## 2019-04-26 NOTE — Patient Instructions (Signed)
Dehydration, Adult Dehydration is a condition in which there is not enough water or other fluids in the body. This happens when a person loses more fluids than he or she takes in. Important organs, such as the kidneys, brain, and heart, cannot function without a proper amount of fluids. Any loss of fluids from the body can lead to dehydration. Dehydration can be mild, moderate, or severe. It should be treated right away to prevent it from becoming severe. What are the causes? Dehydration may be caused by:  Conditions that cause loss of water or other fluids, such as diarrhea, vomiting, or sweating or urinating a lot.  Not drinking enough fluids, especially when you are ill or doing activities that require a lot of energy.  Other illnesses and conditions, such as fever or infection.  Certain medicines, such as medicines that remove excess fluid from the body (diuretics).  Lack of safe drinking water.  Not being able to get enough water and food. What increases the risk? The following factors may make you more likely to develop this condition:  Having a long-term (chronic) illness that has not been treated properly, such as diabetes, heart disease, or kidney disease.  Being 65 years of age or older.  Having a disability.  Living in a place that is high in altitude, where thinner, drier air causes more fluid loss.  Doing exercises that put stress on your body for a long time (endurance sports). What are the signs or symptoms? Symptoms of dehydration depend on how severe it is. Mild or moderate dehydration  Thirst.  Dry lips or dry mouth.  Dizziness or light-headedness, especially when standing up from a seated position.  Muscle cramps.  Dark urine. Urine may be the color of tea.  Less urine or tears produced than usual.  Headache. Severe dehydration  Changes in skin. Your skin may be cold and clammy, blotchy, or pale. Your skin also may not return to normal after being  lightly pinched and released.  Little or no tears, urine, or sweat.  Changes in vital signs, such as rapid breathing and low blood pressure. Your pulse may be weak or may be faster than 100 beats a minute when you are sitting still.  Other changes, such as: ? Feeling very thirsty. ? Sunken eyes. ? Cold hands and feet. ? Confusion. ? Being very tired (lethargic) or having trouble waking from sleep. ? Short-term weight loss. ? Loss of consciousness. How is this diagnosed? This condition is diagnosed based on your symptoms and a physical exam. You may have blood and urine tests to help confirm the diagnosis. How is this treated? Treatment for this condition depends on how severe it is. Treatment should be started right away. Do not wait until dehydration becomes severe. Severe dehydration is an emergency and needs to be treated in a hospital.  Mild or moderate dehydration can be treated at home. You may be asked to: ? Drink more fluids. ? Drink an oral rehydration solution (ORS). This drink helps restore proper amounts of fluids and salts and minerals in the blood (electrolytes).  Severe dehydration can be treated: ? With IV fluids. ? By correcting abnormal levels of electrolytes. This is often done by giving electrolytes through a tube that is passed through your nose and into your stomach (nasogastric tube, or NG tube). ? By treating the underlying cause of dehydration. Follow these instructions at home: Oral rehydration solution If told by your health care provider, drink an ORS:  Make   an ORS by following instructions on the package.  Start by drinking small amounts, about  cup (120 mL) every 5-10 minutes.  Slowly increase how much you drink until you have taken the amount recommended by your health care provider. Eating and drinking         Drink enough clear fluid to keep your urine pale yellow. If you were told to drink an ORS, finish the ORS first and then start slowly  drinking other clear fluids. Drink fluids such as: ? Water. Do not drink only water. Doing that can lead to hyponatremia, which is having too little salt (sodium) in the body. ? Water from ice chips you suck on. ? Fruit juice that you have added water to (diluted fruit juice). ? Low-calorie sports drinks.  Eat foods that contain a healthy balance of electrolytes, such as bananas, oranges, potatoes, tomatoes, and spinach.  Do not drink alcohol.  Avoid the following: ? Drinks that contain a lot of sugar. These include high-calorie sports drinks, fruit juice that is not diluted, and soda. ? Caffeine. ? Foods that are greasy or contain a lot of fat or sugar. General instructions  Take over-the-counter and prescription medicines only as told by your health care provider.  Do not take sodium tablets. Doing that can lead to having too much sodium in the body (hypernatremia).  Return to your normal activities as told by your health care provider. Ask your health care provider what activities are safe for you.  Keep all follow-up visits as told by your health care provider. This is important. Contact a health care provider if:  You have muscle cramps, pain, or discomfort, such as: ? Pain in your abdomen and the pain gets worse or stays in one area (localizes). ? Stiff neck.  You have a rash.  You are more irritable than usual.  You are sleepier or have a harder time waking than usual.  You feel weak or dizzy.  You feel very thirsty. Get help right away if you have:  Any symptoms of severe dehydration.  Symptoms of vomiting, such as: ? You cannot eat or drink without vomiting. ? Vomiting gets worse or does not go away. ? Vomit includes blood or green matter (bile).  Symptoms that get worse with treatment.  A fever.  A severe headache.  Problems with urination or bowel movements, such as: ? Diarrhea that gets worse or does not go away. ? Blood in your stool (feces). This  may cause stool to look black and tarry. ? Not urinating, or urinating only a small amount of very dark urine, within 6-8 hours.  Trouble breathing. These symptoms may represent a serious problem that is an emergency. Do not wait to see if the symptoms will go away. Get medical help right away. Call your local emergency services (911 in the [place] Do not drive yourself to the hospital. Summary  Dehydration is a condition in which there is not enough water or other fluids in the body. This happens when a person loses more fluids than he or she takes in.  Treatment for this condition depends on how severe it is. Treatment should be started right away. Do not wait until dehydration becomes severe.  Drink enough clear fluid to keep your urine pale yellow. If you were told to drink an oral rehydration solution (ORS), finish the ORS first and then start slowly drinking other clear fluids.  Take over-the-counter and prescription medicines only as told by your health care   provider.  Get help right away if you have any symptoms of severe dehydration. This information is not intended to replace advice given to you by your health care provider. Make sure you discuss any questions you have with your health care provider. Document Revised: 10/15/2018 Document Reviewed: 10/15/2018 Elsevier Patient Education  2020 Elsevier Inc[employer]   

## 2019-04-26 NOTE — Progress Notes (Signed)
OK to run IVF's at 750 ml/hr per order of [name].

## 2019-04-28 ENCOUNTER — Inpatient Hospital Stay: Payer: 59

## 2019-04-28 ENCOUNTER — Ambulatory Visit: Payer: 59

## 2019-04-28 ENCOUNTER — Other Ambulatory Visit: Payer: Self-pay | Admitting: Family

## 2019-04-28 ENCOUNTER — Other Ambulatory Visit: Payer: Self-pay

## 2019-04-28 VITALS — BP 114/68 | HR 73 | Temp 97.6°F | Resp 17

## 2019-04-28 DIAGNOSIS — C799 Secondary malignant neoplasm of unspecified site: Secondary | ICD-10-CM

## 2019-04-28 DIAGNOSIS — C160 Malignant neoplasm of cardia: Secondary | ICD-10-CM

## 2019-04-28 DIAGNOSIS — D500 Iron deficiency anemia secondary to blood loss (chronic): Secondary | ICD-10-CM

## 2019-04-28 MED ORDER — HEPARIN SOD (PORK) LOCK FLUSH 100 UNIT/ML IV SOLN
500.0000 [IU] | Freq: Once | INTRAVENOUS | Status: AC
  Administered 2019-04-28: 500 [IU] via INTRAVENOUS
  Filled 2019-04-28: qty 5

## 2019-04-28 MED ORDER — ONDANSETRON HCL 4 MG/2ML IJ SOLN
8.0000 mg | Freq: Once | INTRAMUSCULAR | Status: AC
  Administered 2019-04-28: 8 mg via INTRAVENOUS

## 2019-04-28 MED ORDER — SODIUM CHLORIDE 0.9% FLUSH
10.0000 mL | Freq: Once | INTRAVENOUS | Status: AC
  Administered 2019-04-28: 10 mL via INTRAVENOUS
  Filled 2019-04-28: qty 10

## 2019-04-28 MED ORDER — SODIUM CHLORIDE 0.9 % IV SOLN
INTRAVENOUS | Status: DC
  Filled 2019-04-28 (×2): qty 250

## 2019-04-28 MED ORDER — ONDANSETRON HCL 4 MG/2ML IJ SOLN
INTRAMUSCULAR | Status: AC
  Filled 2019-04-28: qty 4

## 2019-04-28 NOTE — Progress Notes (Signed)
OK to run fluids at 750 ml/hr per [name].

## 2019-04-30 ENCOUNTER — Ambulatory Visit: Payer: 59

## 2019-04-30 ENCOUNTER — Other Ambulatory Visit: Payer: Self-pay

## 2019-04-30 ENCOUNTER — Inpatient Hospital Stay: Payer: 59

## 2019-04-30 VITALS — BP 112/77 | HR 62 | Temp 96.8°F | Resp 17

## 2019-04-30 DIAGNOSIS — C160 Malignant neoplasm of cardia: Secondary | ICD-10-CM | POA: Diagnosis not present

## 2019-04-30 DIAGNOSIS — D500 Iron deficiency anemia secondary to blood loss (chronic): Secondary | ICD-10-CM

## 2019-04-30 DIAGNOSIS — C799 Secondary malignant neoplasm of unspecified site: Secondary | ICD-10-CM

## 2019-04-30 MED ORDER — ONDANSETRON HCL 4 MG/2ML IJ SOLN
8.0000 mg | Freq: Once | INTRAMUSCULAR | Status: AC
  Administered 2019-04-30: 8 mg via INTRAVENOUS

## 2019-04-30 MED ORDER — SODIUM CHLORIDE 0.9% FLUSH
10.0000 mL | INTRAVENOUS | Status: DC | PRN
  Administered 2019-04-30: 10 mL
  Filled 2019-04-30: qty 10

## 2019-04-30 MED ORDER — SODIUM CHLORIDE 0.9 % IV SOLN
INTRAVENOUS | Status: DC
  Filled 2019-04-30 (×2): qty 250

## 2019-04-30 MED ORDER — HEPARIN SOD (PORK) LOCK FLUSH 100 UNIT/ML IV SOLN
500.0000 [IU] | Freq: Once | INTRAVENOUS | Status: AC | PRN
  Administered 2019-04-30: 500 [IU]
  Filled 2019-04-30: qty 5

## 2019-04-30 MED ORDER — ONDANSETRON HCL 4 MG/2ML IJ SOLN
INTRAMUSCULAR | Status: AC
  Filled 2019-04-30: qty 4

## 2019-04-30 NOTE — Progress Notes (Signed)
OK to run IV fluids at 750 ml/hr per [name].

## 2019-04-30 NOTE — Patient Instructions (Signed)

## 2019-05-03 ENCOUNTER — Inpatient Hospital Stay: Payer: 59

## 2019-05-03 ENCOUNTER — Inpatient Hospital Stay: Payer: 59 | Admitting: Hematology & Oncology

## 2019-05-03 ENCOUNTER — Other Ambulatory Visit: Payer: Self-pay

## 2019-05-03 DIAGNOSIS — C160 Malignant neoplasm of cardia: Secondary | ICD-10-CM | POA: Diagnosis not present

## 2019-05-03 LAB — CBC WITH DIFFERENTIAL (CANCER CENTER ONLY)
Abs Immature Granulocytes: 0.04 10*3/uL (ref 0.00–0.07)
Basophils Absolute: 0 10*3/uL (ref 0.0–0.1)
Basophils Relative: 1 %
Eosinophils Absolute: 0 10*3/uL (ref 0.0–0.5)
Eosinophils Relative: 0 %
HCT: 45.3 % (ref 39.0–52.0)
Hemoglobin: 15.8 g/dL (ref 13.0–17.0)
Immature Granulocytes: 1 %
Lymphocytes Relative: 29 %
Lymphs Abs: 1 10*3/uL (ref 0.7–4.0)
MCH: 28.8 pg (ref 26.0–34.0)
MCHC: 34.9 g/dL (ref 30.0–36.0)
MCV: 82.7 fL (ref 80.0–100.0)
Monocytes Absolute: 0.5 10*3/uL (ref 0.1–1.0)
Monocytes Relative: 15 %
Neutro Abs: 1.9 10*3/uL (ref 1.7–7.7)
Neutrophils Relative %: 54 %
Platelet Count: 145 10*3/uL — ABNORMAL LOW (ref 150–400)
RBC: 5.48 MIL/uL (ref 4.22–5.81)
RDW: 14 % (ref 11.5–15.5)
WBC Count: 3.4 10*3/uL — ABNORMAL LOW (ref 4.0–10.5)
nRBC: 0 % (ref 0.0–0.2)

## 2019-05-03 LAB — CMP (CANCER CENTER ONLY)
ALT: 53 U/L — ABNORMAL HIGH (ref 0–44)
AST: 37 U/L (ref 15–41)
Albumin: 3.8 g/dL (ref 3.5–5.0)
Alkaline Phosphatase: 142 U/L — ABNORMAL HIGH (ref 38–126)
Anion gap: 8 (ref 5–15)
BUN: 15 mg/dL (ref 6–20)
CO2: 28 mmol/L (ref 22–32)
Calcium: 9.5 mg/dL (ref 8.9–10.3)
Chloride: 99 mmol/L (ref 98–111)
Creatinine: 0.92 mg/dL (ref 0.61–1.24)
GFR, Est AFR Am: >60 mL/min (ref >60)
GFR, Estimated: >60 mL/min (ref >60)
Glucose, Bld: 125 mg/dL — ABNORMAL HIGH (ref 70–99)
Potassium: 3.9 mmol/L (ref 3.5–5.1)
Sodium: 135 mmol/L (ref 135–145)
Total Bilirubin: 0.6 mg/dL (ref 0.3–1.2)
Total Protein: 6.4 g/dL — ABNORMAL LOW (ref 6.5–8.1)

## 2019-05-03 LAB — LACTATE DEHYDROGENASE: LDH: 162 U/L (ref 98–192)

## 2019-05-03 MED ORDER — SODIUM CHLORIDE 0.9 % IV SOLN
Freq: Once | INTRAVENOUS | Status: AC
  Filled 2019-05-03: qty 250

## 2019-05-03 MED ORDER — DIPHENHYDRAMINE HCL 25 MG PO CAPS
50.0000 mg | ORAL_CAPSULE | Freq: Once | ORAL | Status: AC
  Administered 2019-05-03: 50 mg via ORAL

## 2019-05-03 MED ORDER — ACETAMINOPHEN 325 MG PO TABS
ORAL_TABLET | ORAL | Status: AC
  Filled 2019-05-03: qty 2

## 2019-05-03 MED ORDER — ACETAMINOPHEN 325 MG PO TABS
650.0000 mg | ORAL_TABLET | Freq: Once | ORAL | Status: AC
  Administered 2019-05-03: 650 mg via ORAL

## 2019-05-03 MED ORDER — DIPHENHYDRAMINE HCL 25 MG PO CAPS
ORAL_CAPSULE | ORAL | Status: AC
  Filled 2019-05-03: qty 2

## 2019-05-03 MED ORDER — TRASTUZUMAB-ANNS CHEMO 150 MG IV SOLR
8.0000 mg/kg | Freq: Once | INTRAVENOUS | Status: AC
  Administered 2019-05-03: 714 mg via INTRAVENOUS
  Filled 2019-05-03: qty 34

## 2019-05-03 MED ORDER — SODIUM CHLORIDE 0.9% FLUSH
10.0000 mL | INTRAVENOUS | Status: DC | PRN
  Administered 2019-05-03: 10 mL
  Filled 2019-05-03: qty 10

## 2019-05-03 MED ORDER — HEPARIN SOD (PORK) LOCK FLUSH 100 UNIT/ML IV SOLN
500.0000 [IU] | Freq: Once | INTRAVENOUS | Status: AC | PRN
  Administered 2019-05-03: 500 [IU]
  Filled 2019-05-03: qty 5

## 2019-05-03 NOTE — Patient Instructions (Signed)
[facility] Discharge Instructions for Patients Receiving Chemotherapy  Today you received the following chemotherapy agents Herceptin  To help prevent nausea and vomiting after your treatment, we encourage you to take your nausea medication as prescribed by MD.    If you develop nausea and vomiting that is not controlled by your nausea medication, call the clinic.   BELOW ARE SYMPTOMS THAT SHOULD BE REPORTED IMMEDIATELY:  *FEVER GREATER THAN 100.5 F  *CHILLS WITH OR WITHOUT FEVER  NAUSEA AND VOMITING THAT IS NOT CONTROLLED WITH YOUR NAUSEA MEDICATION  *UNUSUAL SHORTNESS OF BREATH  *UNUSUAL BRUISING OR BLEEDING  TENDERNESS IN MOUTH AND THROAT WITH OR WITHOUT PRESENCE OF ULCERS  *URINARY PROBLEMS  *BOWEL PROBLEMS  UNUSUAL RASH Items with * indicate a potential emergency and should be followed up as soon as possible.  Feel free to call the clinic should you have any questions or concerns. The clinic phone number is [phone].  Please show the [facility] at check-in to the Emergency Department and triage nurse.

## 2019-05-03 NOTE — Progress Notes (Signed)
Patient with weight loss. Leave trastuzumab at current dose 714 mg per [name].

## 2019-05-03 NOTE — Progress Notes (Unsigned)
Hematology and Oncology Follow Up Visit  Matthew Reynolds [id number] October 11, 1965 54 y.o. 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  He really looks quite good.  He finished up his radiation couple weeks ago.  He is having a little bit of dysphagia.  However, this is improving.  He has had no problems with pain.  He has had no cough.  He has had no nausea or vomiting.  There is been no issues with his bowels or bladder.  He has had no rashes.  He has had no headache.  There is no bleeding.  It is still a little too early to do any type of scans or endoscopies on him.  Overall, his performance status is ECOG 1.   Medications:  Allergies as of 05/03/2019   No Known Allergies     Medication List       Accurate as of May 03, 2019 11:53 AM. If you have any questions, ask your nurse or doctor.        STOP taking these medications   telmisartan 40 MG tablet Commonly known as: Micardis Stopped by: [name], MD     TAKE these medications   baclofen 10 MG tablet Commonly known as: LIORESAL Take 1 tablet (10 mg total) by mouth every 8 (eight) hours as needed for muscle spasms.   Dexilant 60 MG capsule Generic drug: dexlansoprazole Take 1 capsule (60 mg total) by mouth daily.   lidocaine-prilocaine cream Commonly known as: EMLA Apply 1 application topically as needed.   MULTI-VITAMIN DAILY PO Take by mouth.   ondansetron 8 MG disintegrating tablet Commonly known as: Zofran ODT Take 1 tablet (8 mg total)  by mouth every 12 (twelve) hours as needed for nausea or vomiting.   prochlorperazine 10 MG tablet Commonly known as: COMPAZINE Take 1 tablet (10 mg total) by mouth every 6 (six) hours as needed (Nausea or vomiting).   sucralfate 1 GM/10ML suspension Commonly known as: Carafate Take 10 mLs (1 g total) by mouth 4 (four) times daily -  with meals and at bedtime.       Allergies: No Known Allergies  Past Medical History, Surgical history, Social history, and Family History were reviewed and updated.  Review of Systems: Review of Systems  Constitutional: Negative.   HENT: Negative.   Eyes: Negative.   Respiratory: Negative.   Cardiovascular: Negative.   Gastrointestinal: Negative.   Genitourinary: Negative.   Musculoskeletal: Negative.   Skin: Negative.   Neurological: Positive for tingling.  Endo/Heme/Allergies: Negative.   Psychiatric/Behavioral: Negative.    Marland Kitchen   Physical Exam:  weight is 163 lb (73.9 kg). His temporal temperature is 96.6 F (35.9 C) (abnormal). His blood pressure is 108/79 and his pulse is 98. His respiration is 17 and oxygen saturation is 100%.   Wt Readings from Last 3 Encounters:  05/03/19 163 lb (73.9 kg)  04/05/19 185 lb (83.9 kg)  03/25/19 192 lb (87.1 kg)    Physical Exam Vitals reviewed.  HENT:     Head: Normocephalic and atraumatic.  Eyes:     Pupils: Pupils are equal, round, and reactive to light.  Cardiovascular:     Rate and Rhythm: Normal rate and regular rhythm.     Heart sounds: Normal heart sounds.  Pulmonary:     Effort: Pulmonary effort is normal.     Breath sounds: Normal breath sounds.  Abdominal:     General: Bowel sounds are normal.     Palpations: Abdomen is soft.  Musculoskeletal:        General: No tenderness or deformity. Normal range of motion.     Cervical back: Normal range of motion.  Lymphadenopathy:     Cervical: No cervical adenopathy.  Skin:    General: Skin is warm and dry.     Findings: No erythema  or rash.  Neurological:     Mental Status: He is alert and oriented to person, place, and time.  Psychiatric:        Behavior: Behavior normal.        Thought Content: Thought content normal.        Judgment: Judgment normal.      Lab Results  Component Value Date   WBC 3.4 (L) 05/03/2019   HGB 15.8 05/03/2019   HCT 45.3 05/03/2019   MCV 82.7 05/03/2019   PLT 145 (L) 05/03/2019   Lab Results  Component Value Date   FERRITIN 215 03/25/2019   IRON 92 03/25/2019   TIBC 274 03/25/2019   UIBC 182 03/25/2019   IRONPCTSAT 34 03/25/2019   Lab Results  Component Value Date   RBC 5.48 05/03/2019   No results found for: KPAFRELGTCHN, LAMBDASER, KAPLAMBRATIO No results found for: IGGSERUM, IGA, IGMSERUM No results found for: TOTALPROTELP, ALBUMINELP, A1GS, A2GS, BETS, BETA2SER, GAMS, MSPIKE, SPEI   Chemistry      Component Value Date/Time   NA 137 04/05/2019 0800   K 3.9 04/05/2019 0800   CL 102 04/05/2019 0800   CO2 28 04/05/2019 0800   BUN 12 04/05/2019 0800   CREATININE 0.97 04/05/2019 0800      Component Value Date/Time   CALCIUM 9.4 04/05/2019 0800   ALKPHOS 100 04/05/2019 0800   AST 26 04/05/2019 0800   ALT 39 04/05/2019 0800   BILITOT 0.5 04/05/2019 0800       [name] is a very pleasant 54 yo caucasian gentleman with metastatic adenocarcinoma of the GE junction, HER-2 positive.  His tumor also is PD-L1 positive.  Now that he is finished the radiation chemotherapy for his locally recurrent esophageal cancer, I think that we should consider systemic therapy for him.  Again, his tumor is PD-L1 positive.  I know there have been some recent reports showing the positive effect of immunotherapy on esophageal cancer that is PD-L1 positive.  I do think that it would be reasonable to combine Herceptin along with nivolumab.  We can do both monthly.  This would be convenient for Matthew Reynolds.  I would not do another follow-up scan or upper endoscopy  probably until April.  I want to make sure that the radiation is out of his system.  I am glad that he got through his treatments without any problem.  He really did well.  I think the fact that he got IV fluids really helped with preventing dehydration.  He has lost a little bit of weight.  I am sure that his swallowing will improve nicely.  I will plan to see  him back in 1 month.  We will see him back, we will do nivolumab along with Herceptin.   [name], MD 2/15/202111:53 AM

## 2019-05-03 NOTE — Patient Instructions (Signed)

## 2019-05-04 ENCOUNTER — Telehealth: Payer: Self-pay | Admitting: Hematology & Oncology

## 2019-05-04 NOTE — Telephone Encounter (Signed)
No los 2/15

## 2019-05-05 NOTE — Progress Notes (Incomplete)
  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 He reported some mild to moderate fatigue, nausea, slight pain with coughing/burping, mouth pain, thick saliva, and difficulty swallowing. His mouth pain improved after swishing baking soda. He also used Carafate. He denied shortness of breath, vomiting, and diarrhea. Skin was slightly hyperpigmented.  Of note, the patient was receiving IV fluids 2-3 times a week in the medical oncology clinic towards the end of treatment. He was able to tolerate liquids and some broth.  Plan: The patient will follow-up with radiation oncology in one month.  ________________________________________________   Blair Promise, PhD, MD  This document serves as a record of services personally performed by Gery Pray, MD. It was created on his behalf by Clerance Lav, a trained medical scribe. The creation of this record is based on the scribe's personal observations and the provider's statements to them. This document has been checked and approved by the attending provider.

## 2019-05-06 ENCOUNTER — Other Ambulatory Visit: Payer: Self-pay | Admitting: Pharmacist

## 2019-05-19 NOTE — Progress Notes (Signed)
Radiation Oncology         [phone] ________________________________  Name: Matthew Reynolds MRN: [id number]  Date: 05/20/2019  DOB: [birth year]  Follow-Up Visit Note  CC: Cari Caraway, MD  [name], MD    ICD-10-CM   1. Adenocarcinoma of cardio-esophageal junction (HCC)  C16.0     Diagnosis:   Recurrentmetastatic adenocarcinoma of the GE junction, HER-2 positive  Interval Since Last Radiation:  1 month  03/10/2019 through 04/20/2019 Site Technique Total Dose (Gy) Dose per Fx (Gy) Completed Fx Beam Energies  Esophagus: Esoph_Bst 3D 5.4/5.4 1.8 3/3 10X, 15X  Esophagus: Esoph 3D 45/45 1.8 25/25 10X, 15X    Narrative:  The patient returns today for routine follow-up. He last saw [name] for follow up on 05/03/2019 and continues to receive Herceptin. The plan is to add nivolumab to his monthly Herceptin at his next infusion.   On review of systems, he reports improvement in his radiation-induced fatigue and his difficulty swallowing. He states he has an easier time eating now. He can now eat many foods that he was unable to prior to treatment  ALLERGIES:  has No Known Allergies.  Meds: Current Outpatient Medications  Medication Sig Dispense Refill  . baclofen (LIORESAL) 10 MG tablet Take 1 tablet (10 mg total) by mouth every 8 (eight) hours as needed for muscle spasms. 30 each 0  . dexlansoprazole (DEXILANT) 60 MG capsule Take 1 capsule (60 mg total) by mouth daily. 30 capsule 6  . lidocaine-prilocaine (EMLA) cream Apply 1 application topically as needed. 30 g 0  . Multiple Vitamin (MULTI-VITAMIN DAILY PO) Take by mouth.    . ondansetron (ZOFRAN ODT) 8 MG disintegrating tablet Take 1 tablet (8 mg total) by mouth every 12 (twelve) hours as needed for nausea or vomiting. 20 tablet 1  . prochlorperazine (COMPAZINE) 10 MG tablet Take 1 tablet (10 mg total) by mouth every 6 (six) hours as needed (Nausea or vomiting). 30 tablet 3  . sucralfate (CARAFATE) 1 GM/10ML suspension  Take 10 mLs (1 g total) by mouth 4 (four) times daily -  with meals and at bedtime. 420 mL 0   No current facility-administered medications for this encounter.   Facility-Administered Medications Ordered in Other Encounters  Medication Dose Route Frequency Provider Last Rate Last Admin  . sodium chloride flush (NS) 0.9 % injection 10 mL  10 mL Intravenous PRN [name], MD   10 mL at 03/31/18 1100    Physical Findings: The patient is in no acute distress. Patient is alert and oriented.  weight is 165 lb 6.4 oz (75 kg). His temperature is 97.8 F (36.6 C). His blood pressure is 114/93 (abnormal) and his pulse is 102 (abnormal). His respiration is 20 and oxygen saturation is 99%. .  No significant changes. Lungs are clear to auscultation bilaterally. Heart has regular rate and rhythm. No palpable cervical, supraclavicular, or axillary adenopathy. Abdomen soft, non-tender, normal bowel sounds.   Lab Findings: Lab Results  Component Value Date   WBC 3.4 (L) 05/03/2019   HGB 15.8 05/03/2019   HCT 45.3 05/03/2019   MCV 82.7 05/03/2019   PLT 145 (L) 05/03/2019    Radiographic Findings: No results found.  Impression:  Recurrent GE junction cancer  The patient is recovering from the effects of radiation.  Patient is quite pleased with his response to radiation therapy in terms of eating.  Plan: As needed follow-up in radiation oncology.  He will continue on therapy as above  with medical oncology.  ____________________________________ Gery Pray, MD    This document serves as a record of services personally performed by Gery Pray, MD. It was created on his behalf by Wilburn Mylar, a trained medical scribe. The creation of this record is based on the scribe's personal observations and the provider's statements to them. This document has been checked and approved by the attending provider.

## 2019-05-20 ENCOUNTER — Ambulatory Visit
Admission: RE | Admit: 2019-05-20 | Discharge: 2019-05-20 | Disposition: A | Discharge status: Home / Self Care | Payer: 59 | Source: Ambulatory Visit | Attending: Radiation Oncology | Admitting: Radiation Oncology

## 2019-05-20 ENCOUNTER — Encounter: Payer: Self-pay | Admitting: Radiation Oncology

## 2019-05-20 ENCOUNTER — Other Ambulatory Visit: Payer: Self-pay

## 2019-05-20 ENCOUNTER — Encounter: Payer: Self-pay | Admitting: Hematology & Oncology

## 2019-05-20 VITALS — BP 114/93 | HR 102 | Temp 97.8°F | Resp 20 | Wt 165.4 lb

## 2019-05-20 DIAGNOSIS — Z79899 Other long term (current) drug therapy: Secondary | ICD-10-CM | POA: Insufficient documentation

## 2019-05-20 DIAGNOSIS — R5383 Other fatigue: Secondary | ICD-10-CM | POA: Insufficient documentation

## 2019-05-20 DIAGNOSIS — C160 Malignant neoplasm of cardia: Secondary | ICD-10-CM

## 2019-05-20 DIAGNOSIS — Z923 Personal history of irradiation: Secondary | ICD-10-CM | POA: Diagnosis not present

## 2019-05-20 NOTE — Progress Notes (Signed)
Mr. Goynes presents today for f/u with Dr. Sondra Come. Pt reports swallowing difficulty is improving since completing XRT. Pt reports fatigue is slowly resolving since XRT. Pt reports skin continues to be WNL. Pt reports easier time of eating now.   BP (!) 114/93 (BP Location: Left Arm, Patient Position: Sitting, Cuff Size: Normal)   Pulse (!) 102   Temp 97.8 F (36.6 C)   Resp 20   Wt 165 lb 6.4 oz (75 kg)   SpO2 99%   BMI 24.43 kg/m   Wt Readings from Last 3 Encounters:  05/20/19 165 lb 6.4 oz (75 kg)  05/03/19 163 lb (73.9 kg)  04/05/19 185 lb (83.9 kg)   Loma Sousa, RN BSN

## 2019-05-20 NOTE — Patient Instructions (Signed)
Coronavirus (COVID-19) Are you at risk?  Are you at risk for the Coronavirus (COVID-19)?  To be considered HIGH RISK for Coronavirus (COVID-19), you have to meet the following criteria:  . Traveled to [place], [place], [place], [place] or [place]; or in the [place] to [place], [place], [place], or [place]; and have fever, cough, and shortness of breath within the last 2 weeks of travel OR . Been in close contact with a person diagnosed with COVID-19 within the last 2 weeks and have fever, cough, and shortness of breath . IF YOU DO NOT MEET THESE CRITERIA, YOU ARE CONSIDERED LOW RISK FOR COVID-19.  What to do if you are HIGH RISK for COVID-19?  . If you are having a medical emergency, call 911. . Seek medical care right away. Before you go to a doctor's office, urgent care or emergency department, call ahead and tell them about your recent travel, contact with someone diagnosed with COVID-19, and your symptoms. You should receive instructions from your physician's office regarding next steps of care.  . When you arrive at healthcare provider, tell the healthcare staff immediately you have returned from visiting [place], [place], [place], [place] or [place]; or traveled in the [place] to [place], [place], [place], or [place]; in the last two weeks or you have been in close contact with a person diagnosed with COVID-19 in the last 2 weeks.   . Tell the health care staff about your symptoms: fever, cough and shortness of breath. . After you have been seen by a medical provider, you will be either: o Tested for (COVID-19) and discharged home on quarantine except to seek medical care if symptoms worsen, and asked to  - Stay home and avoid contact with others until you get your results (4-5 days)  - Avoid travel on public transportation if possible (such as bus, train, or airplane) or o Sent to the Emergency Department by EMS for evaluation, COVID-19 testing, and possible  admission depending on your condition and test results.  What to do if you are LOW RISK for COVID-19?  Reduce your risk of any infection by using the same precautions used for avoiding the common cold or flu:  . Wash your hands often with soap and warm water for at least 20 seconds.  If soap and water are not readily available, use an alcohol-based hand sanitizer with at least 60% alcohol.  . If coughing or sneezing, cover your mouth and nose by coughing or sneezing into the elbow areas of your shirt or coat, into a tissue or into your sleeve (not your hands). . Avoid shaking hands with others and consider head nods or verbal greetings only. . Avoid touching your eyes, nose, or mouth with unwashed hands.  . Avoid close contact with people who are sick. . Avoid places or events with large numbers of people in one location, like concerts or sporting events. . Carefully consider travel plans you have or are making. . If you are planning any travel outside or inside the [place], visit the CDC's Travelers' Health webpage for the latest health notices. . If you have some symptoms but not all symptoms, continue to monitor at home and seek medical attention if your symptoms worsen. . If you are having a medical emergency, call 911.   ADDITIONAL HEALTHCARE OPTIONS FOR PATIENTS  [facility] / e-Visit: [email or web address]         [facility]: [phone]  [facility]: [phone]                   [facility]: [phone]   

## 2019-05-24 ENCOUNTER — Encounter: Payer: Self-pay | Admitting: Hematology & Oncology

## 2019-05-25 ENCOUNTER — Telehealth: Payer: Self-pay | Admitting: Hematology & Oncology

## 2019-05-25 ENCOUNTER — Encounter: Payer: Self-pay | Admitting: Hematology & Oncology

## 2019-05-25 NOTE — Telephone Encounter (Signed)
Called and LMVM with updated appointments date/time change per 3/9 My Chart Message

## 2019-05-28 ENCOUNTER — Other Ambulatory Visit: Payer: Self-pay | Admitting: *Deleted

## 2019-05-28 DIAGNOSIS — C160 Malignant neoplasm of cardia: Secondary | ICD-10-CM

## 2019-05-28 DIAGNOSIS — C799 Secondary malignant neoplasm of unspecified site: Secondary | ICD-10-CM

## 2019-05-28 DIAGNOSIS — C787 Secondary malignant neoplasm of liver and intrahepatic bile duct: Secondary | ICD-10-CM

## 2019-05-31 ENCOUNTER — Other Ambulatory Visit: Payer: 59

## 2019-05-31 ENCOUNTER — Inpatient Hospital Stay: Payer: 59

## 2019-05-31 ENCOUNTER — Ambulatory Visit: Payer: 59

## 2019-05-31 ENCOUNTER — Ambulatory Visit: Payer: 59 | Admitting: Hematology & Oncology

## 2019-05-31 ENCOUNTER — Inpatient Hospital Stay: Payer: 59 | Attending: Hematology & Oncology

## 2019-05-31 ENCOUNTER — Other Ambulatory Visit: Payer: Self-pay

## 2019-05-31 ENCOUNTER — Inpatient Hospital Stay (HOSPITAL_BASED_OUTPATIENT_CLINIC_OR_DEPARTMENT_OTHER): Payer: 59 | Admitting: Hematology & Oncology

## 2019-05-31 VITALS — BP 134/92 | HR 86 | Temp 97.1°F | Resp 18 | Wt 166.0 lb

## 2019-05-31 DIAGNOSIS — C799 Secondary malignant neoplasm of unspecified site: Secondary | ICD-10-CM | POA: Diagnosis not present

## 2019-05-31 DIAGNOSIS — K5900 Constipation, unspecified: Secondary | ICD-10-CM | POA: Insufficient documentation

## 2019-05-31 DIAGNOSIS — Z5112 Encounter for antineoplastic immunotherapy: Secondary | ICD-10-CM | POA: Diagnosis not present

## 2019-05-31 DIAGNOSIS — M21372 Foot drop, left foot: Secondary | ICD-10-CM | POA: Insufficient documentation

## 2019-05-31 DIAGNOSIS — C160 Malignant neoplasm of cardia: Secondary | ICD-10-CM

## 2019-05-31 DIAGNOSIS — Z79899 Other long term (current) drug therapy: Secondary | ICD-10-CM | POA: Insufficient documentation

## 2019-05-31 DIAGNOSIS — C787 Secondary malignant neoplasm of liver and intrahepatic bile duct: Secondary | ICD-10-CM

## 2019-05-31 DIAGNOSIS — Z7189 Other specified counseling: Secondary | ICD-10-CM

## 2019-05-31 LAB — CBC WITH DIFFERENTIAL (CANCER CENTER ONLY)
Abs Immature Granulocytes: 0.03 10*3/uL (ref 0.00–0.07)
Basophils Absolute: 0 10*3/uL (ref 0.0–0.1)
Basophils Relative: 1 %
Eosinophils Absolute: 0.1 10*3/uL (ref 0.0–0.5)
Eosinophils Relative: 2 %
HCT: 39.1 % (ref 39.0–52.0)
Hemoglobin: 13.5 g/dL (ref 13.0–17.0)
Immature Granulocytes: 1 %
Lymphocytes Relative: 23 %
Lymphs Abs: 1.2 10*3/uL (ref 0.7–4.0)
MCH: 29.7 pg (ref 26.0–34.0)
MCHC: 34.5 g/dL (ref 30.0–36.0)
MCV: 86.1 fL (ref 80.0–100.0)
Monocytes Absolute: 0.6 10*3/uL (ref 0.1–1.0)
Monocytes Relative: 12 %
Neutro Abs: 3.1 10*3/uL (ref 1.7–7.7)
Neutrophils Relative %: 61 %
Platelet Count: 135 10*3/uL — ABNORMAL LOW (ref 150–400)
RBC: 4.54 MIL/uL (ref 4.22–5.81)
RDW: 15.9 % — ABNORMAL HIGH (ref 11.5–15.5)
WBC Count: 5 10*3/uL (ref 4.0–10.5)
nRBC: 0 % (ref 0.0–0.2)

## 2019-05-31 LAB — CMP (CANCER CENTER ONLY)
ALT: 18 U/L (ref 0–44)
AST: 18 U/L (ref 15–41)
Albumin: 4 g/dL (ref 3.5–5.0)
Alkaline Phosphatase: 105 U/L (ref 38–126)
Anion gap: 7 (ref 5–15)
BUN: 18 mg/dL (ref 6–20)
CO2: 28 mmol/L (ref 22–32)
Calcium: 9.3 mg/dL (ref 8.9–10.3)
Chloride: 105 mmol/L (ref 98–111)
Creatinine: 0.79 mg/dL (ref 0.61–1.24)
GFR, Est AFR Am: >60 mL/min (ref >60)
GFR, Estimated: >60 mL/min (ref >60)
Glucose, Bld: 126 mg/dL — ABNORMAL HIGH (ref 70–99)
Potassium: 3.8 mmol/L (ref 3.5–5.1)
Sodium: 140 mmol/L (ref 135–145)
Total Bilirubin: 0.6 mg/dL (ref 0.3–1.2)
Total Protein: 6.5 g/dL (ref 6.5–8.1)

## 2019-05-31 MED ORDER — LIDOCAINE-PRILOCAINE 2.5-2.5 % EX CREA: TOPICAL_CREAM | CUTANEOUS | 3 refills | Status: DC

## 2019-05-31 MED ORDER — ACETAMINOPHEN 325 MG PO TABS
ORAL_TABLET | ORAL | Status: AC
  Filled 2019-05-31: qty 2

## 2019-05-31 MED ORDER — PROCHLORPERAZINE MALEATE 10 MG PO TABS: 10.0000 mg | ORAL_TABLET | Freq: Four times a day (QID) | ORAL | 1 refills | Status: DC | PRN

## 2019-05-31 MED ORDER — SODIUM CHLORIDE 0.9 % IV SOLN
Freq: Once | INTRAVENOUS | Status: AC
  Filled 2019-05-31: qty 250

## 2019-05-31 MED ORDER — SODIUM CHLORIDE 0.9 % IV SOLN
200.0000 mg | Freq: Once | INTRAVENOUS | Status: AC
  Administered 2019-05-31: 200 mg via INTRAVENOUS
  Filled 2019-05-31: qty 8

## 2019-05-31 MED ORDER — DIPHENHYDRAMINE HCL 25 MG PO CAPS
50.0000 mg | ORAL_CAPSULE | Freq: Once | ORAL | Status: AC
  Administered 2019-05-31: 25 mg via ORAL

## 2019-05-31 MED ORDER — DIPHENHYDRAMINE HCL 25 MG PO CAPS
ORAL_CAPSULE | ORAL | Status: AC
  Filled 2019-05-31: qty 1

## 2019-05-31 MED ORDER — SODIUM CHLORIDE 0.9% FLUSH
10.0000 mL | INTRAVENOUS | Status: DC | PRN
  Administered 2019-05-31: 11:00:00 10 mL
  Filled 2019-05-31: qty 10

## 2019-05-31 MED ORDER — TRASTUZUMAB-ANNS CHEMO 150 MG IV SOLR
450.0000 mg | Freq: Once | INTRAVENOUS | Status: AC
  Administered 2019-05-31: 11:00:00 450 mg via INTRAVENOUS
  Filled 2019-05-31: qty 21.43

## 2019-05-31 MED ORDER — HEPARIN SOD (PORK) LOCK FLUSH 100 UNIT/ML IV SOLN
500.0000 [IU] | Freq: Once | INTRAVENOUS | Status: AC | PRN
  Administered 2019-05-31: 500 [IU]
  Filled 2019-05-31: qty 5

## 2019-05-31 MED ORDER — ONDANSETRON HCL 8 MG PO TABS: 8.0000 mg | ORAL_TABLET | Freq: Two times a day (BID) | ORAL | 1 refills | Status: DC | PRN

## 2019-05-31 MED ORDER — ACETAMINOPHEN 325 MG PO TABS
650.0000 mg | ORAL_TABLET | Freq: Once | ORAL | Status: AC
  Administered 2019-05-31: 650 mg via ORAL

## 2019-05-31 MED ORDER — LORAZEPAM 0.5 MG PO TABS: 0.5000 mg | ORAL_TABLET | Freq: Four times a day (QID) | ORAL | 0 refills | Status: DC | PRN

## 2019-05-31 NOTE — Patient Instructions (Signed)

## 2019-05-31 NOTE — Patient Instructions (Signed)
Pembrolizumab injection What is this medicine? PEMBROLIZUMAB (pem broe liz ue mab) is a monoclonal antibody. It is used to treat certain types of cancer. This medicine may be used for other purposes; ask your health care provider or pharmacist if you have questions. COMMON BRAND NAME(S): Keytruda What should I tell my health care provider before I take this medicine? They need to know if you have any of these conditions:  diabetes  immune system problems  inflammatory bowel disease  liver disease  lung or breathing disease  lupus  received or scheduled to receive an organ transplant or a stem-cell transplant that uses donor stem cells  an unusual or allergic reaction to pembrolizumab, other medicines, foods, dyes, or preservatives  pregnant or trying to get pregnant  breast-feeding How should I use this medicine? This medicine is for infusion into a vein. It is given by a health care professional in a hospital or clinic setting. A special MedGuide will be given to you before each treatment. Be sure to read this information carefully each time. Talk to your pediatrician regarding the use of this medicine in children. While this drug may be prescribed for children as young as 6 months for selected conditions, precautions do apply. Overdosage: If you think you have taken too much of this medicine contact a poison control center or emergency room at once. NOTE: This medicine is only for you. Do not share this medicine with others. What if I miss a dose? It is important not to miss your dose. Call your doctor or health care professional if you are unable to keep an appointment. What may interact with this medicine? Interactions have not been studied. Give your health care provider a list of all the medicines, herbs, non-prescription drugs, or dietary supplements you use. Also tell them if you smoke, drink alcohol, or use illegal drugs. Some items may interact with your medicine. This  list may not describe all possible interactions. Give your health care provider a list of all the medicines, herbs, non-prescription drugs, or dietary supplements you use. Also tell them if you smoke, drink alcohol, or use illegal drugs. Some items may interact with your medicine. What should I watch for while using this medicine? Your condition will be monitored carefully while you are receiving this medicine. You may need blood work done while you are taking this medicine. Do not become pregnant while taking this medicine or for 4 months after stopping it. Women should inform their doctor if they wish to become pregnant or think they might be pregnant. There is a potential for serious side effects to an unborn child. Talk to your health care professional or pharmacist for more information. Do not breast-feed an infant while taking this medicine or for 4 months after the last dose. What side effects may I notice from receiving this medicine? Side effects that you should report to your doctor or health care professional as soon as possible:  allergic reactions like skin rash, itching or hives, swelling of the face, lips, or tongue  bloody or black, tarry  breathing problems  changes in vision  chest pain  chills  confusion  constipation  cough  diarrhea  dizziness or feeling faint or lightheaded  fast or irregular heartbeat  fever  flushing  joint pain  low blood counts - this medicine may decrease the number of white blood cells, red blood cells and platelets. You may be at increased risk for infections and bleeding.  muscle pain  muscle   weakness  pain, tingling, numbness in the hands or feet  persistent headache  redness, blistering, peeling or loosening of the skin, including inside the mouth  signs and symptoms of high blood sugar such as dizziness; dry mouth; dry skin; fruity breath; nausea; stomach pain; increased hunger or thirst; increased urination  signs  and symptoms of kidney injury like trouble passing urine or change in the amount of urine  signs and symptoms of liver injury like dark urine, light-colored stools, loss of appetite, nausea, right upper belly pain, yellowing of the eyes or skin  sweating  swollen lymph nodes  weight loss Side effects that usually do not require medical attention (report to your doctor or health care professional if they continue or are bothersome):  decreased appetite  hair loss  muscle pain  tiredness This list may not describe all possible side effects. Call your doctor for medical advice about side effects. You may report side effects to FDA at 1-800-FDA-1088. Where should I keep my medicine? This drug is given in a hospital or clinic and will not be stored at home. NOTE: This sheet is a summary. It may not cover all possible information. If you have questions about this medicine, talk to your doctor, pharmacist, or health care provider.  2020 Elsevier/Gold Standard (2019-01-08 18:07:58)  

## 2019-05-31 NOTE — Progress Notes (Signed)
Hematology and Oncology Follow Up Visit  Matthew Reynolds [id number] 1965-11-17 54 y.o. 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  He is doing quite well.  His only complaint is that there is some foot drop in the left foot.  This may go on for about a week.  There is no trauma.  He has had no fall.  He has had no problems with bowels or bladder.  He has had no numbness in the foot.  Otherwise he is doing quite well.  He is eating well.  He swallowing well.  He has had no issues with bleeding.  He is a little constipated.  He has had no nausea or vomiting.  There is been no mouth sores.  He has had no visual changes.  He had a really good weekend.  As always, he is quite busy with his family.  Overall, I was his performance status is ECOG 1.   Medications:  Allergies as of 05/31/2019   No Known Allergies     Medication List       Accurate as of May 31, 2019  8:14 AM. If you have any questions, ask your nurse or doctor.        baclofen 10 MG tablet Commonly known as: LIORESAL Take 1 tablet (10 mg total) by mouth every 8 (eight) hours as needed for muscle spasms.   Dexilant 60 MG capsule Generic drug: dexlansoprazole Take 1 capsule (60 mg total) by mouth daily.   lidocaine-prilocaine cream Commonly known as: EMLA Apply 1 application topically as needed.   MULTI-VITAMIN DAILY PO Take by mouth.   ondansetron 8 MG disintegrating tablet Commonly known as: Zofran ODT Take 1 tablet (8 mg total) by mouth every 12  (twelve) hours as needed for nausea or vomiting.   prochlorperazine 10 MG tablet Commonly known as: COMPAZINE Take 1 tablet (10 mg total) by mouth every 6 (six) hours as needed (Nausea or vomiting).   sucralfate 1 GM/10ML suspension Commonly known as: Carafate Take 10 mLs (1 g total) by mouth 4 (four) times daily -  with meals and at bedtime.       Allergies: No Known Allergies  Past Medical History, Surgical history, Social history, and Family History were reviewed and updated.  Review of Systems: Review of Systems  Constitutional: Negative.   HENT: Negative.   Eyes: Negative.   Respiratory: Negative.   Cardiovascular: Negative.   Gastrointestinal: Negative.   Genitourinary: Negative.   Musculoskeletal: Negative.   Skin: Negative.   Neurological: Positive for tingling.  Endo/Heme/Allergies: Negative.   Psychiatric/Behavioral: Negative.    Marland Kitchen   Physical Exam:  vitals were not taken for this visit.   Wt Readings from Last 3 Encounters:  05/20/19 165 lb 6.4 oz (75 kg)  05/03/19 163 lb (73.9 kg)  04/05/19 185 lb (83.9 kg)    Physical Exam Vitals reviewed.  HENT:     Head: Normocephalic and atraumatic.  Eyes:     Pupils: Pupils  are equal, round, and reactive to light.  Cardiovascular:     Rate and Rhythm: Normal rate and regular rhythm.     Heart sounds: Normal heart sounds.  Pulmonary:     Effort: Pulmonary effort is normal.     Breath sounds: Normal breath sounds.  Abdominal:     General: Bowel sounds are normal.     Palpations: Abdomen is soft.  Musculoskeletal:        General: No tenderness or deformity. Normal range of motion.     Cervical back: Normal range of motion.     Comments: His extremities does show some weakness in the left foot with dorsiflexion.  Lymphadenopathy:     Cervical: No cervical adenopathy.  Skin:    General: Skin is warm and dry.     Findings: No erythema or rash.  Neurological:     Mental Status: He is alert and oriented to  person, place, and time.  Psychiatric:        Behavior: Behavior normal.        Thought Content: Thought content normal.        Judgment: Judgment normal.      Lab Results  Component Value Date   WBC 3.4 (L) 05/03/2019   HGB 15.8 05/03/2019   HCT 45.3 05/03/2019   MCV 82.7 05/03/2019   PLT 145 (L) 05/03/2019   Lab Results  Component Value Date   FERRITIN 215 03/25/2019   IRON 92 03/25/2019   TIBC 274 03/25/2019   UIBC 182 03/25/2019   IRONPCTSAT 34 03/25/2019   Lab Results  Component Value Date   RBC 5.48 05/03/2019   No results found for: KPAFRELGTCHN, LAMBDASER, KAPLAMBRATIO No results found for: IGGSERUM, IGA, IGMSERUM No results found for: Ronnald Ramp, A1GS, A2GS, Tillman Sers, SPEI   Chemistry      Component Value Date/Time   NA 135 05/03/2019 1130   K 3.9 05/03/2019 1130   CL 99 05/03/2019 1130   CO2 28 05/03/2019 1130   BUN 15 05/03/2019 1130   CREATININE 0.92 05/03/2019 1130      Component Value Date/Time   CALCIUM 9.5 05/03/2019 1130   ALKPHOS 142 (H) 05/03/2019 1130   AST 37 05/03/2019 1130   ALT 53 (H) 05/03/2019 1130   BILITOT 0.6 05/03/2019 1130       [name] is a very pleasant 54 yo caucasian gentleman with metastatic adenocarcinoma of the GE junction, HER-2 positive.  His tumor also is PD-L1 positive.  We will now get him back on to systemic therapy.  He had been on the Xeloda with his radiation.  I think he is also on Herceptin.  Given that his tumor is PD-L1-L1 positive, we will add immunotherapy.  I think this would be reasonable.  Some new clinical data shows that there is some utility in using one of the newer HER-2 antagonists.  The use of  Enhertu has been shown to be very helpful in patients who have had metastatic gastroesophageal cancer.  We will need to have a upper endoscopy again and see if there is any residual disease.  I will set up with a PET scan.  I want to see how  things look systemically.  We will start his treatments today.  I will have him come back in 1 month.  After that, we probably will do treatments every 3 weeks.  I will get MRI of his brain.  If that is negative, then we  will get an MRI of his lumbar spine.  If that is negative, then he probably will need some type of nerve conduction studies to see what might be going on.     [name], MD 3/15/20218:14 AM

## 2019-06-02 ENCOUNTER — Other Ambulatory Visit: Payer: Self-pay | Admitting: *Deleted

## 2019-06-02 ENCOUNTER — Other Ambulatory Visit: Payer: Self-pay

## 2019-06-02 ENCOUNTER — Telehealth: Payer: Self-pay | Admitting: *Deleted

## 2019-06-02 ENCOUNTER — Ambulatory Visit (HOSPITAL_COMMUNITY)
Admission: RE | Admit: 2019-06-02 | Discharge: 2019-06-02 | Disposition: A | Discharge status: Home / Self Care | Payer: 59 | Source: Ambulatory Visit | Attending: Hematology & Oncology | Admitting: Hematology & Oncology

## 2019-06-02 ENCOUNTER — Encounter: Payer: Self-pay | Admitting: Hematology & Oncology

## 2019-06-02 DIAGNOSIS — C160 Malignant neoplasm of cardia: Secondary | ICD-10-CM | POA: Diagnosis present

## 2019-06-02 MED ORDER — GADOBUTROL 1 MMOL/ML IV SOLN
7.0000 mL | Freq: Once | INTRAVENOUS | Status: AC | PRN
  Administered 2019-06-02: 12:00:00 7 mL via INTRAVENOUS

## 2019-06-02 NOTE — Addendum Note (Signed)
Addended by: Burney Gauze R on: 06/02/2019 02:13 PM   Modules accepted: Orders

## 2019-06-02 NOTE — Telephone Encounter (Signed)
MRI brain call report received from [facility] in MRI, "No evidence of metastatic disease, no evidence of intra-cranial abnormality, a few scattered T-2 hyperintensities within the white matter."  [name] notified.

## 2019-06-02 NOTE — Telephone Encounter (Signed)
Patient notified per order of [name] that MRI of the brain is negative and that MRI of the LS spine will be ordered.  Pt appreciative of call back and has no questions or concerns at this time.

## 2019-06-03 ENCOUNTER — Ambulatory Visit: Payer: 59

## 2019-06-03 ENCOUNTER — Ambulatory Visit: Payer: 59 | Admitting: Hematology & Oncology

## 2019-06-03 ENCOUNTER — Other Ambulatory Visit: Payer: 59

## 2019-06-11 MED FILL — SUCRALFATE 1 GM/10ML SUSP: 1 | 21 days supply | Qty: 840 | Fill #0

## 2019-06-14 ENCOUNTER — Telehealth: Payer: Self-pay | Admitting: Hematology & Oncology

## 2019-06-14 NOTE — Telephone Encounter (Signed)
I called [facility] to see why his Mr had not been scheduled yet.  I Spoke with brandy and she did not have answer as to why it wasn't scheduled.  There first available appointment was not until 4/22.  I explained that this was supposed to be completed by 3/22. I then call Central Scheduling and spoke with Manuela Schwartz who was able to get Mr Marucci scheduled for 4/4 @ 1:00 w/ arrival @ 12:30.  He was ok with both date/time.Marland Kitchen

## 2019-06-19 ENCOUNTER — Other Ambulatory Visit (HOSPITAL_BASED_OUTPATIENT_CLINIC_OR_DEPARTMENT_OTHER): Payer: 59

## 2019-06-20 ENCOUNTER — Other Ambulatory Visit: Payer: Self-pay

## 2019-06-20 ENCOUNTER — Ambulatory Visit (HOSPITAL_COMMUNITY)
Admission: RE | Admit: 2019-06-20 | Discharge: 2019-06-20 | Disposition: A | Discharge status: Home / Self Care | Payer: 59 | Source: Ambulatory Visit | Attending: Hematology & Oncology | Admitting: Hematology & Oncology

## 2019-06-20 DIAGNOSIS — C160 Malignant neoplasm of cardia: Secondary | ICD-10-CM | POA: Insufficient documentation

## 2019-06-20 MED ORDER — GADOBUTROL 1 MMOL/ML IV SOLN
7.5000 mL | Freq: Once | INTRAVENOUS | Status: AC | PRN
  Administered 2019-06-20: 7.5 mL via INTRAVENOUS

## 2019-06-21 ENCOUNTER — Other Ambulatory Visit: Payer: 59

## 2019-06-21 ENCOUNTER — Ambulatory Visit: Payer: 59

## 2019-06-21 ENCOUNTER — Encounter: Payer: Self-pay | Admitting: *Deleted

## 2019-06-21 ENCOUNTER — Other Ambulatory Visit: Payer: Self-pay | Admitting: Family

## 2019-06-21 ENCOUNTER — Ambulatory Visit: Payer: 59 | Admitting: Hematology & Oncology

## 2019-06-21 DIAGNOSIS — T451X5A Adverse effect of antineoplastic and immunosuppressive drugs, initial encounter: Secondary | ICD-10-CM

## 2019-06-21 DIAGNOSIS — I427 Cardiomyopathy due to drug and external agent: Secondary | ICD-10-CM

## 2019-06-21 DIAGNOSIS — C160 Malignant neoplasm of cardia: Secondary | ICD-10-CM

## 2019-06-21 NOTE — Progress Notes (Unsigned)
Pharmacist Chemotherapy Monitoring - Follow Up Assessment    I verify that I have reviewed each item in the below checklist:  . Regimen for the patient is scheduled for the appropriate day and plan matches scheduled date. Marland Kitchen Appropriate non-routine labs are ordered dependent on drug ordered. . If applicable, additional medications reviewed and ordered per protocol based on lifetime cumulative doses and/or treatment regimen.   Plan for follow-up and/or issues identified: Yes . I-vent associated with next due treatment: Yes . MD and/or nursing notified: Yes  Jazilyn Siegenthaler, Jacqlyn Larsen 06/21/2019 8:04 AM

## 2019-06-23 ENCOUNTER — Other Ambulatory Visit: Payer: Self-pay

## 2019-06-23 ENCOUNTER — Encounter: Payer: Self-pay | Admitting: Hematology & Oncology

## 2019-06-23 ENCOUNTER — Ambulatory Visit (HOSPITAL_COMMUNITY)
Admission: RE | Admit: 2019-06-23 | Discharge: 2019-06-23 | Disposition: A | Discharge status: Home / Self Care | Payer: 59 | Source: Ambulatory Visit | Attending: Hematology & Oncology | Admitting: Hematology & Oncology

## 2019-06-23 DIAGNOSIS — C160 Malignant neoplasm of cardia: Secondary | ICD-10-CM | POA: Diagnosis not present

## 2019-06-23 LAB — GLUCOSE, CAPILLARY: Glucose-Capillary: 96 mg/dL (ref 70–99)

## 2019-06-23 MED ORDER — FLUDEOXYGLUCOSE F - 18 (FDG) INJECTION
8.4500 | Freq: Once | INTRAVENOUS | Status: AC | PRN
  Administered 2019-06-23: 8.45 via INTRAVENOUS

## 2019-06-24 ENCOUNTER — Ambulatory Visit (HOSPITAL_BASED_OUTPATIENT_CLINIC_OR_DEPARTMENT_OTHER)
Admission: RE | Admit: 2019-06-24 | Discharge: 2019-06-24 | Disposition: A | Discharge status: Home / Self Care | Payer: 59 | Source: Ambulatory Visit | Attending: Family | Admitting: Family

## 2019-06-24 ENCOUNTER — Other Ambulatory Visit: Payer: Self-pay | Admitting: Hematology & Oncology

## 2019-06-24 DIAGNOSIS — T451X5A Adverse effect of antineoplastic and immunosuppressive drugs, initial encounter: Secondary | ICD-10-CM | POA: Diagnosis present

## 2019-06-24 DIAGNOSIS — I427 Cardiomyopathy due to drug and external agent: Secondary | ICD-10-CM | POA: Diagnosis not present

## 2019-06-24 DIAGNOSIS — Z0189 Encounter for other specified special examinations: Secondary | ICD-10-CM | POA: Diagnosis not present

## 2019-06-24 DIAGNOSIS — C160 Malignant neoplasm of cardia: Secondary | ICD-10-CM | POA: Diagnosis present

## 2019-06-24 MED FILL — DEXILANT DR 60 MG CAPSULE: 60 | 30 days supply | Qty: 30 | Fill #0

## 2019-06-24 NOTE — Progress Notes (Signed)
  Echocardiogram 2D Echocardiogram has been performed.  Matthew Reynolds 06/24/2019, 10:57 AM

## 2019-06-25 ENCOUNTER — Encounter: Payer: Self-pay | Admitting: *Deleted

## 2019-06-28 ENCOUNTER — Inpatient Hospital Stay: Payer: 59

## 2019-06-29 ENCOUNTER — Inpatient Hospital Stay: Payer: 59

## 2019-06-29 ENCOUNTER — Inpatient Hospital Stay (HOSPITAL_BASED_OUTPATIENT_CLINIC_OR_DEPARTMENT_OTHER): Payer: 59 | Admitting: Hematology & Oncology

## 2019-06-29 ENCOUNTER — Other Ambulatory Visit: Payer: Self-pay

## 2019-06-29 ENCOUNTER — Inpatient Hospital Stay: Payer: 59 | Attending: Hematology & Oncology

## 2019-06-29 ENCOUNTER — Encounter: Payer: Self-pay | Admitting: Hematology & Oncology

## 2019-06-29 VITALS — Wt 161.0 lb

## 2019-06-29 VITALS — BP 120/78 | HR 80 | Temp 97.0°F | Resp 18

## 2019-06-29 DIAGNOSIS — Z5112 Encounter for antineoplastic immunotherapy: Secondary | ICD-10-CM | POA: Diagnosis present

## 2019-06-29 DIAGNOSIS — Z7189 Other specified counseling: Secondary | ICD-10-CM

## 2019-06-29 DIAGNOSIS — C160 Malignant neoplasm of cardia: Secondary | ICD-10-CM

## 2019-06-29 DIAGNOSIS — Z79899 Other long term (current) drug therapy: Secondary | ICD-10-CM | POA: Insufficient documentation

## 2019-06-29 LAB — CMP (CANCER CENTER ONLY)
ALT: 28 U/L (ref 0–44)
AST: 25 U/L (ref 15–41)
Albumin: 4 g/dL (ref 3.5–5.0)
Alkaline Phosphatase: 141 U/L — ABNORMAL HIGH (ref 38–126)
Anion gap: 6 (ref 5–15)
BUN: 11 mg/dL (ref 6–20)
CO2: 29 mmol/L (ref 22–32)
Calcium: 9.4 mg/dL (ref 8.9–10.3)
Chloride: 102 mmol/L (ref 98–111)
Creatinine: 0.82 mg/dL (ref 0.61–1.24)
GFR, Est AFR Am: >60 mL/min (ref >60)
GFR, Estimated: >60 mL/min (ref >60)
Glucose, Bld: 183 mg/dL — ABNORMAL HIGH (ref 70–99)
Potassium: 3.9 mmol/L (ref 3.5–5.1)
Sodium: 137 mmol/L (ref 135–145)
Total Bilirubin: 0.7 mg/dL (ref 0.3–1.2)
Total Protein: 6.7 g/dL (ref 6.5–8.1)

## 2019-06-29 LAB — CBC WITH DIFFERENTIAL (CANCER CENTER ONLY)
Abs Immature Granulocytes: 0.01 10*3/uL (ref 0.00–0.07)
Basophils Absolute: 0 10*3/uL (ref 0.0–0.1)
Basophils Relative: 0 %
Eosinophils Absolute: 0.2 10*3/uL (ref 0.0–0.5)
Eosinophils Relative: 3 %
HCT: 41.1 % (ref 39.0–52.0)
Hemoglobin: 14 g/dL (ref 13.0–17.0)
Immature Granulocytes: 0 %
Lymphocytes Relative: 15 %
Lymphs Abs: 0.7 10*3/uL (ref 0.7–4.0)
MCH: 30.4 pg (ref 26.0–34.0)
MCHC: 34.1 g/dL (ref 30.0–36.0)
MCV: 89.3 fL (ref 80.0–100.0)
Monocytes Absolute: 0.6 10*3/uL (ref 0.1–1.0)
Monocytes Relative: 13 %
Neutro Abs: 3.2 10*3/uL (ref 1.7–7.7)
Neutrophils Relative %: 69 %
Platelet Count: 151 10*3/uL (ref 150–400)
RBC: 4.6 MIL/uL (ref 4.22–5.81)
RDW: 13.4 % (ref 11.5–15.5)
WBC Count: 4.7 10*3/uL (ref 4.0–10.5)
nRBC: 0 % (ref 0.0–0.2)

## 2019-06-29 LAB — TSH: TSH: 0.883 u[IU]/mL (ref 0.320–4.118)

## 2019-06-29 MED ORDER — ACETAMINOPHEN 325 MG PO TABS
ORAL_TABLET | ORAL | Status: AC
  Filled 2019-06-29: qty 2

## 2019-06-29 MED ORDER — DIPHENHYDRAMINE HCL 25 MG PO CAPS
ORAL_CAPSULE | ORAL | Status: AC
  Filled 2019-06-29: qty 2

## 2019-06-29 MED ORDER — HEPARIN SOD (PORK) LOCK FLUSH 100 UNIT/ML IV SOLN
500.0000 [IU] | Freq: Once | INTRAVENOUS | Status: DC | PRN
  Filled 2019-06-29: qty 5

## 2019-06-29 MED ORDER — PALONOSETRON HCL INJECTION 0.25 MG/5ML
INTRAVENOUS | Status: AC
  Filled 2019-06-29: qty 5

## 2019-06-29 MED ORDER — SODIUM CHLORIDE 0.9 % IV SOLN
10.0000 mg | Freq: Once | INTRAVENOUS | Status: AC
  Administered 2019-06-29: 10 mg via INTRAVENOUS
  Filled 2019-06-29: qty 10

## 2019-06-29 MED ORDER — FAM-TRASTUZUMAB DERUXTECAN-NXKI CHEMO 100 MG IV SOLR: 5.3000 mg/kg | Freq: Once | INTRAVENOUS | Status: DC

## 2019-06-29 MED ORDER — SODIUM CHLORIDE 0.9 % IV SOLN: 200.0000 mg | Freq: Once | INTRAVENOUS | Status: DC

## 2019-06-29 MED ORDER — SODIUM CHLORIDE 0.9 % IV SOLN
Freq: Once | INTRAVENOUS | Status: DC
  Filled 2019-06-29: qty 250

## 2019-06-29 MED ORDER — ACETAMINOPHEN 325 MG PO TABS
650.0000 mg | ORAL_TABLET | Freq: Once | ORAL | Status: AC
  Administered 2019-06-29: 650 mg via ORAL

## 2019-06-29 MED ORDER — DEXTROSE 5 % IV SOLN
Freq: Once | INTRAVENOUS | Status: AC
  Filled 2019-06-29: qty 250

## 2019-06-29 MED ORDER — FAM-TRASTUZUMAB DERUXTECAN-NXKI CHEMO 100 MG IV SOLR
500.0000 mg | Freq: Once | INTRAVENOUS | Status: AC
  Administered 2019-06-29: 500 mg via INTRAVENOUS
  Filled 2019-06-29: qty 25

## 2019-06-29 MED ORDER — DIPHENHYDRAMINE HCL 25 MG PO CAPS
50.0000 mg | ORAL_CAPSULE | Freq: Once | ORAL | Status: AC
  Administered 2019-06-29: 50 mg via ORAL

## 2019-06-29 MED ORDER — SODIUM CHLORIDE 0.9% FLUSH
10.0000 mL | INTRAVENOUS | Status: DC | PRN
  Administered 2019-06-29: 10 mL
  Filled 2019-06-29: qty 10

## 2019-06-29 MED ORDER — HEPARIN SOD (PORK) LOCK FLUSH 100 UNIT/ML IV SOLN
500.0000 [IU] | Freq: Once | INTRAVENOUS | Status: AC | PRN
  Administered 2019-06-29: 500 [IU]
  Filled 2019-06-29: qty 5

## 2019-06-29 MED ORDER — SODIUM CHLORIDE 0.9% FLUSH
10.0000 mL | INTRAVENOUS | Status: DC | PRN
  Filled 2019-06-29: qty 10

## 2019-06-29 MED ORDER — PALONOSETRON HCL INJECTION 0.25 MG/5ML
0.2500 mg | Freq: Once | INTRAVENOUS | Status: AC
  Administered 2019-06-29: 0.25 mg via INTRAVENOUS

## 2019-06-29 NOTE — Patient Instructions (Signed)
[facility] Discharge Instructions for Patients Receiving Chemotherapy  Today you received the following chemotherapy agents Enhertu  To help prevent nausea and vomiting after your treatment, we encourage you to take your nausea medication as medicated per MD.  **DO NOT TAKE ZOFRAN FOR 3 DAYS AFTER CHEMOTHERAPY**   If you develop nausea and vomiting that is not controlled by your nausea medication, call the clinic.   BELOW ARE SYMPTOMS THAT SHOULD BE REPORTED IMMEDIATELY:  *FEVER GREATER THAN 100.5 F  *CHILLS WITH OR WITHOUT FEVER  NAUSEA AND VOMITING THAT IS NOT CONTROLLED WITH YOUR NAUSEA MEDICATION  *UNUSUAL SHORTNESS OF BREATH  *UNUSUAL BRUISING OR BLEEDING  TENDERNESS IN MOUTH AND THROAT WITH OR WITHOUT PRESENCE OF ULCERS  *URINARY PROBLEMS  *BOWEL PROBLEMS  UNUSUAL RASH Items with * indicate a potential emergency and should be followed up as soon as possible.  Feel free to call the clinic should you have any questions or concerns. The clinic phone number is [phone].  Please show the [facility] at check-in to the Emergency Department and triage nurse.

## 2019-06-29 NOTE — Progress Notes (Signed)
Hematology and Oncology Follow Up Visit  Matthew Reynolds [id number] Dec 12, 1965 54 y.o. 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  He really looks good.  He feels well.  He had a very nice [place] with his family.  Everybody in the family, including him, has gotten the coronavirus vaccine.  The 1 problem that we have is that we did do a PET scan on him.  This was done last week.  The PET scan did seem to show some suggestion of progressive disease.  PET scan showed a new 8 mm celiac axis node with an SUV of 5.2.  He had bilateral adrenal gland abnormalities with an SUV of 7.3 on the left and 5.3 on the right.  The skeleton looked fine.  Nothing it was in the liver.  There was some activity in the lower esophagus.  I spoke to his gastroenterologist.  She feels this is relevant to esophageal radiation and esophagitis.  I do think that we had to make a change in his treatment protocol.  In fact, a new clinical trial came out about 3 weeks ago that show that one of the new Herceptin preparations- Enhertu -is very effective in patients who have gastroesophageal and gastric cancer that is HER-2 positive.  I think Enhertu would be very worthwhile trying.  I believe that the toxicity profile would be reasonable.  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  Overall, Matthew Reynolds really feels well.  He has had no problems with bowels or bladder.  There  is no bleeding.  He has had no cough or shortness of breath.  Overall, I would say [name].   Medications:  Allergies as of 06/29/2019   No Known Allergies     Medication List       Accurate as of June 29, 2019 11:28 AM. If you have any questions, ask your nurse or doctor.        STOP taking these medications   LORazepam 0.5 MG tablet Commonly known as: Ativan   ondansetron 8 MG tablet Commonly known as: Zofran   prochlorperazine 10 MG tablet Commonly known as: COMPAZINE     TAKE these medications   Dexilant 60 MG capsule Generic drug: dexlansoprazole Take 1 capsule (60 mg total) by mouth daily.   lidocaine-prilocaine cream Commonly known as: EMLA Apply 1 application topically as needed. What changed: Another medication with the same name was removed. Continue taking this medication, and follow the directions you see here.   MULTI-VITAMIN DAILY PO Take by mouth.   sucralfate 1 GM/10ML suspension Commonly known as: CARAFATE Take by mouth 3 (three) times daily.       Allergies: No Known Allergies  Past Medical History, Surgical history, Social history, and Family History were reviewed and updated.  Review of Systems: Review of Systems  Constitutional: Negative.   HENT: Negative.  Eyes: Negative.   Respiratory: Negative.   Cardiovascular: Negative.   Gastrointestinal: Negative.   Genitourinary: Negative.   Musculoskeletal: Negative.   Skin: Negative.   Neurological: Positive for tingling.  Endo/Heme/Allergies: Negative.   Psychiatric/Behavioral: Negative.    Marland Kitchen   Physical Exam:  weight is 161 lb (73 kg).   Wt Readings from Last 3 Encounters:  06/29/19 161 lb (73 kg)  05/31/19 166 lb (75.3 kg)  05/20/19 165 lb 6.4 oz (75 kg)    Physical Exam Vitals reviewed.  HENT:     Head: Normocephalic and atraumatic.  Eyes:     Pupils: Pupils are equal, round, and reactive to light.  Cardiovascular:     Rate and Rhythm:  Normal rate and regular rhythm.     Heart sounds: Normal heart sounds.  Pulmonary:     Effort: Pulmonary effort is normal.     Breath sounds: Normal breath sounds.  Abdominal:     General: Bowel sounds are normal.     Palpations: Abdomen is soft.  Musculoskeletal:        General: No tenderness or deformity. Normal range of motion.     Cervical back: Normal range of motion.     Comments: His extremities does show some weakness in the left foot with dorsiflexion.  Lymphadenopathy:     Cervical: No cervical adenopathy.  Skin:    General: Skin is warm and dry.     Findings: No erythema or rash.  Neurological:     Mental Status: He is alert and oriented to person, place, and time.  Psychiatric:        Behavior: Behavior normal.        Thought Content: Thought content normal.        Judgment: Judgment normal.      Lab Results  Component Value Date   WBC 4.7 06/29/2019   HGB 14.0 06/29/2019   HCT 41.1 06/29/2019   MCV 89.3 06/29/2019   PLT 151 06/29/2019   Lab Results  Component Value Date   FERRITIN 215 03/25/2019   IRON 92 03/25/2019   TIBC 274 03/25/2019   UIBC 182 03/25/2019   IRONPCTSAT 34 03/25/2019   Lab Results  Component Value Date   RBC 4.60 06/29/2019   No results found for: KPAFRELGTCHN, LAMBDASER, KAPLAMBRATIO No results found for: IGGSERUM, IGA, IGMSERUM No results found for: Ronnald Ramp, A1GS, A2GS, Tillman Sers, SPEI   Chemistry      Component Value Date/Time   NA 137 06/29/2019 1017   K 3.9 06/29/2019 1017   CL 102 06/29/2019 1017   CO2 29 06/29/2019 1017   BUN 11 06/29/2019 1017   CREATININE 0.82 06/29/2019 1017      Component Value Date/Time   CALCIUM 9.4 06/29/2019 1017   ALKPHOS 141 (H) 06/29/2019 1017   AST 25 06/29/2019 1017   ALT 28 06/29/2019 1017   BILITOT 0.7 06/29/2019 1017       [name] is a very pleasant 54 yo caucasian gentleman with metastatic adenocarcinoma of the GE  junction, HER-2 positive.  His tumor also is PD-L1 positive.  We will go ahead with the Enhertu today.  We have cannot use immunotherapy with his according to his insurance company.  I think that the Enhertu he clearly is more important to give them immunotherapy.  I believe that Matthew Reynolds will respond to the Enhertu.  I will give 3 cycles of the Enhertu and then repeat his PET scan.  I forgot to mention that the foot drop that he had seems to be getting better.  We did do a brain scan on him which was unremarkable.  We did do a scan of the lumbar spine which also was unremarkable.    I will plan to see him back in 3 weeks.  [name], MD 4/13/202111:28 AM

## 2019-06-29 NOTE — Patient Instructions (Signed)
Implanted Port Insertion, Care After °This sheet gives you information about how to care for yourself after your procedure. Your health care provider may also give you more specific instructions. If you have problems or questions, contact your health care provider. °What can I expect after the procedure? °After the procedure, it is common to have: °· Discomfort at the port insertion site. °· Bruising on the skin over the port. This should improve over 3-4 days. °Follow these instructions at home: °Port care °· After your port is placed, you will get a manufacturer's information card. The card has information about your port. Keep this card with you at all times. °· Take care of the port as told by your health care provider. Ask your health care provider if you or a family member can get training for taking care of the port at home. A home health care nurse may also take care of the port. °· Make sure to remember what type of port you have. °Incision care ° °  ° °· Follow instructions from your health care provider about how to take care of your port insertion site. Make sure you: °? Wash your hands with soap and water before and after you change your bandage (dressing). If soap and water are not available, use hand sanitizer. °? Change your dressing as told by your health care provider. °? Leave stitches (sutures), skin glue, or adhesive strips in place. These skin closures may need to stay in place for 2 weeks or longer. If adhesive strip edges start to loosen and curl up, you may trim the loose edges. Do not remove adhesive strips completely unless your health care provider tells you to do that. °· Check your port insertion site every day for signs of infection. Check for: °? Redness, swelling, or pain. °? Fluid or blood. °? Warmth. °? Pus or a bad smell. °Activity °· Return to your normal activities as told by your health care provider. Ask your health care provider what activities are safe for you. °· Do not  lift anything that is heavier than 10 lb (4.5 kg), or the limit that you are told, until your health care provider says that it is safe. °General instructions °· Take over-the-counter and prescription medicines only as told by your health care provider. °· Do not take baths, swim, or use a hot tub until your health care provider approves. Ask your health care provider if you may take showers. You may only be allowed to take sponge baths. °· Do not drive for 24 hours if you were given a sedative during your procedure. °· Wear a medical alert bracelet in case of an emergency. This will tell any health care providers that you have a port. °· Keep all follow-up visits as told by your health care provider. This is important. °Contact a health care provider if: °· You cannot flush your port with saline as directed, or you cannot draw blood from the port. °· You have a fever or chills. °· You have redness, swelling, or pain around your port insertion site. °· You have fluid or blood coming from your port insertion site. °· Your port insertion site feels warm to the touch. °· You have pus or a bad smell coming from the port insertion site. °Get help right away if: °· You have chest pain or shortness of breath. °· You have bleeding from your port that you cannot control. °Summary °· Take care of the port as told by your health   care provider. Keep the manufacturer's information card with you at all times. °· Change your dressing as told by your health care provider. °· Contact a health care provider if you have a fever or chills or if you have redness, swelling, or pain around your port insertion site. °· Keep all follow-up visits as told by your health care provider. °This information is not intended to replace advice given to you by your health care provider. Make sure you discuss any questions you have with your health care provider. °Document Revised: 09/30/2017 Document Reviewed: 09/30/2017 °Elsevier Patient Education ©  2020 [employer] Inc[employer] ° °

## 2019-06-29 NOTE — Progress Notes (Signed)
Insurance will not allow Enhertu in combination with pembrolizumab. Pembrolizumab deleted, Enhertu to begin today. PA is in place.  Pharmacist Chemotherapy Monitoring - Initial Assessment    Anticipated start date: 06/29/19  Regimen:  . Are orders appropriate based on the patient's diagnosis, regimen, and cycle? Yes . Does the plan date match the patient's scheduled date? Yes . Is the sequencing of drugs appropriate? Yes . Are the premedications appropriate for the patient's regimen? Yes . Prior Authorization for treatment is: Approved o If applicable, is the correct biosimilar selected based on the patient's insurance? not applicable  Organ Function and Labs: Marland Kitchen Are dose adjustments needed based on the patient's renal function, hepatic function, or hematologic function? No . Are appropriate labs ordered prior to the start of patient's treatment? Yes . Other organ system assessment, if indicated: trastuzumab: Echo/ MUGA . The following baseline labs, if indicated, have been ordered: N/A  Dose Assessment: . Are the drug doses appropriate? Yes . Are the following correct: o Drug concentrations Yes o IV fluid compatible with drug Yes o Administration routes Yes o Timing of therapy Yes . If applicable, does the patient have documented access for treatment and/or plans for port-a-cath placement? yes . If applicable, have lifetime cumulative doses been properly documented and assessed? not applicable Lifetime Dose Tracking  . Oxaliplatin: 591.287 mg/m2 (1,185 mg) = 98.55 % of the maximum lifetime dose of 600 mg/m2  o   Toxicity Monitoring/Prevention: . The patient has the following take home antiemetics prescribed: Already has from previous chemo  . The patient has the following take home medications prescribed: N/A . Medication allergies and previous infusion related reactions, if applicable, have been reviewed and addressed. Yes . The patient's current medication list has been  assessed for drug-drug interactions with their chemotherapy regimen. no significant drug-drug interactions were identified on review.  Order Review: . Are the treatment plan orders signed? Yes . Is the patient scheduled to see a provider prior to their treatment? Yes  I verify that I have reviewed each item in the above checklist and answered each question accordingly.  Topanga Alvelo, Jacqlyn Larsen 06/29/2019 12:12 PM

## 2019-06-30 LAB — T4: T4, Total: 9.4 ug/dL (ref 4.5–12.0)

## 2019-07-12 ENCOUNTER — Other Ambulatory Visit: Payer: 59

## 2019-07-12 ENCOUNTER — Ambulatory Visit: Payer: 59

## 2019-07-12 ENCOUNTER — Ambulatory Visit: Payer: 59 | Admitting: Hematology & Oncology

## 2019-07-12 NOTE — Progress Notes (Signed)
Pharmacist Chemotherapy Monitoring - Follow Up Assessment    I verify that I have reviewed each item in the below checklist:  . Regimen for the patient is scheduled for the appropriate day and plan matches scheduled date. Marland Kitchen Appropriate non-routine labs are ordered dependent on drug ordered. . If applicable, additional medications reviewed and ordered per protocol based on lifetime cumulative doses and/or treatment regimen.   Plan for follow-up and/or issues identified: No . I-vent associated with next due treatment: No . MD and/or nursing notified: No  Salmaan Patchin, Jacqlyn Larsen 07/12/2019 7:50 AM

## 2019-07-13 ENCOUNTER — Telehealth: Payer: Self-pay | Admitting: Hematology & Oncology

## 2019-07-13 NOTE — Telephone Encounter (Signed)
Faxed medical records to: [facility]: [phone]      COPY SCANNED

## 2019-07-19 ENCOUNTER — Other Ambulatory Visit: Payer: Self-pay

## 2019-07-19 ENCOUNTER — Inpatient Hospital Stay: Payer: 59 | Attending: Hematology & Oncology

## 2019-07-19 ENCOUNTER — Inpatient Hospital Stay (HOSPITAL_BASED_OUTPATIENT_CLINIC_OR_DEPARTMENT_OTHER): Payer: 59 | Admitting: Hematology & Oncology

## 2019-07-19 ENCOUNTER — Inpatient Hospital Stay: Payer: 59

## 2019-07-19 ENCOUNTER — Encounter: Payer: Self-pay | Admitting: Hematology & Oncology

## 2019-07-19 VITALS — Wt 159.0 lb

## 2019-07-19 DIAGNOSIS — Z5112 Encounter for antineoplastic immunotherapy: Secondary | ICD-10-CM | POA: Insufficient documentation

## 2019-07-19 DIAGNOSIS — C160 Malignant neoplasm of cardia: Secondary | ICD-10-CM

## 2019-07-19 DIAGNOSIS — Z7189 Other specified counseling: Secondary | ICD-10-CM

## 2019-07-19 DIAGNOSIS — Z79899 Other long term (current) drug therapy: Secondary | ICD-10-CM | POA: Diagnosis not present

## 2019-07-19 LAB — CMP (CANCER CENTER ONLY)
ALT: 21 U/L (ref 0–44)
AST: 24 U/L (ref 15–41)
Albumin: 3.7 g/dL (ref 3.5–5.0)
Alkaline Phosphatase: 131 U/L — ABNORMAL HIGH (ref 38–126)
Anion gap: 6 (ref 5–15)
BUN: 11 mg/dL (ref 6–20)
CO2: 29 mmol/L (ref 22–32)
Calcium: 9.3 mg/dL (ref 8.9–10.3)
Chloride: 106 mmol/L (ref 98–111)
Creatinine: 0.92 mg/dL (ref 0.61–1.24)
GFR, Est AFR Am: >60 mL/min (ref >60)
GFR, Estimated: >60 mL/min (ref >60)
Glucose, Bld: 95 mg/dL (ref 70–99)
Potassium: 3.8 mmol/L (ref 3.5–5.1)
Sodium: 141 mmol/L (ref 135–145)
Total Bilirubin: 0.4 mg/dL (ref 0.3–1.2)
Total Protein: 6.5 g/dL (ref 6.5–8.1)

## 2019-07-19 LAB — CBC WITH DIFFERENTIAL (CANCER CENTER ONLY)
Abs Immature Granulocytes: 0.02 10*3/uL (ref 0.00–0.07)
Basophils Absolute: 0 10*3/uL (ref 0.0–0.1)
Basophils Relative: 2 %
Eosinophils Absolute: 0.1 10*3/uL (ref 0.0–0.5)
Eosinophils Relative: 3 %
HCT: 37.2 % — ABNORMAL LOW (ref 39.0–52.0)
Hemoglobin: 12.6 g/dL — ABNORMAL LOW (ref 13.0–17.0)
Immature Granulocytes: 1 %
Lymphocytes Relative: 29 %
Lymphs Abs: 0.7 10*3/uL (ref 0.7–4.0)
MCH: 30.9 pg (ref 26.0–34.0)
MCHC: 33.9 g/dL (ref 30.0–36.0)
MCV: 91.2 fL (ref 80.0–100.0)
Monocytes Absolute: 0.5 10*3/uL (ref 0.1–1.0)
Monocytes Relative: 20 %
Neutro Abs: 1.1 10*3/uL — ABNORMAL LOW (ref 1.7–7.7)
Neutrophils Relative %: 45 %
Platelet Count: 205 10*3/uL (ref 150–400)
RBC: 4.08 MIL/uL — ABNORMAL LOW (ref 4.22–5.81)
RDW: 13.2 % (ref 11.5–15.5)
WBC Count: 2.4 10*3/uL — ABNORMAL LOW (ref 4.0–10.5)
nRBC: 0 % (ref 0.0–0.2)

## 2019-07-19 LAB — LACTATE DEHYDROGENASE: LDH: 142 U/L (ref 98–192)

## 2019-07-19 MED ORDER — PALONOSETRON HCL INJECTION 0.25 MG/5ML
INTRAVENOUS | Status: AC
  Filled 2019-07-19: qty 5

## 2019-07-19 MED ORDER — SODIUM CHLORIDE 0.9% FLUSH
10.0000 mL | INTRAVENOUS | Status: DC | PRN
  Administered 2019-07-19: 10 mL
  Filled 2019-07-19: qty 10

## 2019-07-19 MED ORDER — HEPARIN SOD (PORK) LOCK FLUSH 100 UNIT/ML IV SOLN
500.0000 [IU] | Freq: Once | INTRAVENOUS | Status: AC | PRN
  Administered 2019-07-19: 500 [IU]
  Filled 2019-07-19: qty 5

## 2019-07-19 MED ORDER — SUCRALFATE 1 GM/10ML PO SUSP: 1.0000 g | Freq: Three times a day (TID) | ORAL | 3 refills | Status: DC

## 2019-07-19 MED ORDER — ACETAMINOPHEN 325 MG PO TABS
650.0000 mg | ORAL_TABLET | Freq: Once | ORAL | Status: AC
  Administered 2019-07-19: 650 mg via ORAL

## 2019-07-19 MED ORDER — ACETAMINOPHEN 325 MG PO TABS
ORAL_TABLET | ORAL | Status: AC
  Filled 2019-07-19: qty 2

## 2019-07-19 MED ORDER — FAM-TRASTUZUMAB DERUXTECAN-NXKI CHEMO 100 MG IV SOLR
500.0000 mg | Freq: Once | INTRAVENOUS | Status: AC
  Administered 2019-07-19: 500 mg via INTRAVENOUS
  Filled 2019-07-19: qty 25

## 2019-07-19 MED ORDER — DEXTROSE 5 % IV SOLN
Freq: Once | INTRAVENOUS | Status: AC
  Filled 2019-07-19: qty 250

## 2019-07-19 MED ORDER — PALONOSETRON HCL INJECTION 0.25 MG/5ML
0.2500 mg | Freq: Once | INTRAVENOUS | Status: AC
  Administered 2019-07-19: 0.25 mg via INTRAVENOUS

## 2019-07-19 MED ORDER — DIPHENHYDRAMINE HCL 25 MG PO CAPS
ORAL_CAPSULE | ORAL | Status: AC
  Filled 2019-07-19: qty 2

## 2019-07-19 MED ORDER — SODIUM CHLORIDE 0.9 % IV SOLN
10.0000 mg | Freq: Once | INTRAVENOUS | Status: AC
  Administered 2019-07-19: 10 mg via INTRAVENOUS
  Filled 2019-07-19: qty 10

## 2019-07-19 MED ORDER — DIPHENHYDRAMINE HCL 25 MG PO CAPS
50.0000 mg | ORAL_CAPSULE | Freq: Once | ORAL | Status: AC
  Administered 2019-07-19: 50 mg via ORAL

## 2019-07-19 MED FILL — SUCRALFATE 1 GM/10ML SUSP: 1 | 14 days supply | Qty: 420 | Fill #0

## 2019-07-19 NOTE — Progress Notes (Signed)
Hematology and Oncology Follow Up Visit  Matthew Reynolds [id number] 1965-07-18 54 y.o. 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  He really looks good.  He feels well.  He tolerated the first dose of Enhertu without any problems.  He has had no problems with shortness of breath.  He has had a little bit of a cough but he thinks this is from the pollen.  His appetite is doing okay.  He has had no problems with dysphagia or odynophagia.  He has had no problems with bleeding.  There is no change in bowel or bladder habits.  He has had no headache.  There is been no leg swelling.  He and his wife have been trying to walk a little bit more.  Overall, I would say his performance status is ECOG 1.   Medications:  Allergies as of 07/19/2019   No Known Allergies     Medication List       Accurate as of Jul 19, 2019  8:53 AM. If you have any questions, ask your nurse or doctor.        Dexilant 60 MG capsule Generic drug: dexlansoprazole Take 1 capsule (60 mg total) by mouth daily.   lidocaine-prilocaine cream Commonly known as: EMLA Apply 1 application topically as needed.   MULTI-VITAMIN DAILY PO Take by mouth.   sucralfate 1 GM/10ML suspension Commonly known as: CARAFATE Take by mouth 3 (three) times daily.       Allergies: No Known Allergies  Past Medical History, Surgical history, Social history, and Family History were  reviewed and updated.  Review of Systems: Review of Systems  Constitutional: Negative.   HENT: Negative.   Eyes: Negative.   Respiratory: Negative.   Cardiovascular: Negative.   Gastrointestinal: Negative.   Genitourinary: Negative.   Musculoskeletal: Negative.   Skin: Negative.   Neurological: Positive for tingling.  Endo/Heme/Allergies: Negative.   Psychiatric/Behavioral: Negative.    Marland Kitchen   Physical Exam:  vitals were not taken for this visit.   Wt Readings from Last 3 Encounters:  06/29/19 161 lb (73 kg)  05/31/19 166 lb (75.3 kg)  05/20/19 165 lb 6.4 oz (75 kg)    Physical Exam Vitals reviewed.  HENT:     Head: Normocephalic and atraumatic.  Eyes:     Pupils: Pupils are equal, round, and reactive to light.  Cardiovascular:     Rate and Rhythm: Normal rate and regular rhythm.     Heart sounds: Normal heart sounds.  Pulmonary:     Effort: Pulmonary effort is normal.     Breath sounds: Normal breath sounds.  Abdominal:     General: Bowel sounds are normal.     Palpations: Abdomen is soft.  Musculoskeletal:        General: No tenderness or deformity. Normal range of motion.     Cervical back: Normal range of motion.  Comments: His extremities does show some weakness in the left foot with dorsiflexion.  Lymphadenopathy:     Cervical: No cervical adenopathy.  Skin:    General: Skin is warm and dry.     Findings: No erythema or rash.  Neurological:     Mental Status: He is alert and oriented to person, place, and time.  Psychiatric:        Behavior: Behavior normal.        Thought Content: Thought content normal.        Judgment: Judgment normal.      Lab Results  Component Value Date   WBC 2.4 (L) 07/19/2019   HGB 12.6 (L) 07/19/2019   HCT 37.2 (L) 07/19/2019   MCV 91.2 07/19/2019   PLT 205 07/19/2019   Lab Results  Component Value Date   FERRITIN 215 03/25/2019   IRON 92 03/25/2019   TIBC 274 03/25/2019   UIBC 182 03/25/2019   IRONPCTSAT 34  03/25/2019   Lab Results  Component Value Date   RBC 4.08 (L) 07/19/2019   No results found for: KPAFRELGTCHN, LAMBDASER, KAPLAMBRATIO No results found for: IGGSERUM, IGA, IGMSERUM No results found for: Ronnald Ramp, A1GS, A2GS, Tillman Sers, SPEI   Chemistry      Component Value Date/Time   NA 137 06/29/2019 1017   K 3.9 06/29/2019 1017   CL 102 06/29/2019 1017   CO2 29 06/29/2019 1017   BUN 11 06/29/2019 1017   CREATININE 0.82 06/29/2019 1017      Component Value Date/Time   CALCIUM 9.4 06/29/2019 1017   ALKPHOS 141 (H) 06/29/2019 1017   AST 25 06/29/2019 1017   ALT 28 06/29/2019 1017   BILITOT 0.7 06/29/2019 1017       [name] is a very pleasant 54 yo caucasian gentleman with metastatic adenocarcinoma of the GE junction, HER-2 positive.  His tumor also is PD-L1 positive.  We will go ahead with the second cycle of Enhertu today.    I will give 3 cycles of the Enhertu and then repeat his PET scan.  I forgot to mention that the foot drop that he had seems to be getting better.  We did do a brain scan on him which was unremarkable.  We did do a scan of the lumbar spine which also was unremarkable.    I will plan to see him back in 3 weeks.  [name], MD 5/3/20218:53 AM

## 2019-07-19 NOTE — Patient Instructions (Signed)

## 2019-07-19 NOTE — Patient Instructions (Signed)
Fam-Trastuzumab deruxtecan injection What is this medicine? TRASTUZUMAB DERUXTECAN (tras TOOZ eu mab DER ux TEE kan) is a monoclonal antibody combined with chemotherapy. It is used to treat breast cancer. This medicine may be used for other purposes; ask your health care provider or pharmacist if you have questions. COMMON BRAND NAME(S): ENHERTU What should I tell my health care provider before I take this medicine? They need to know if you have any of these conditions:  heart disease  heart failure  infection (especially a virus infection such as chickenpox, cold sores, or herpes)  liver disease  lung or breathing disease, like asthma  an unusual or allergic reaction to fam-trastuzumab deruxtecan, other medications, foods, dyes, or preservatives  pregnant or trying to get pregnant  breast-feeding How should I use this medicine? This medicine is for infusion into a vein. It is given by a health care professional in a hospital or clinic setting. Talk to your pediatrician regarding the use of this medicine in children. Special care may be needed. Overdosage: If you think you have taken too much of this medicine contact a poison control center or emergency room at once. NOTE: This medicine is only for you. Do not share this medicine with others. What if I miss a dose? It is important not to miss your dose. Call your doctor or health care professional if you are unable to keep an appointment. What may interact with this medicine? Interaction studies have not been performed. This list may not describe all possible interactions. Give your health care provider a list of all the medicines, herbs, non-prescription drugs, or dietary supplements you use. Also tell them if you smoke, drink alcohol, or use illegal drugs. Some items may interact with your medicine. What should I watch for while using this medicine? Visit your healthcare professional for regular checks on your progress. Tell your  healthcare professional if your symptoms do not start to get better or if they get worse. Your condition will be monitored carefully while you are receiving this medicine. Do not become pregnant while taking this medicine or for 7 months after stopping it. Women should inform their healthcare professional if they wish to become pregnant or think they might be pregnant. Men should not father a child while taking this medicine and for 4 months after stopping it. There is potential for serious side effects to an unborn child. Talk to your healthcare professional for more information. Do not breast-feed an infant while taking this medicine or for 7 months after the last dose. This medicine has caused decreased sperm counts in some men. This may make it more difficult to father a child. Talk to your healthcare professional if you are concerned about your fertility. This medicine may increase your risk to bruise or bleed. Call your health care professional if you notice any unusual bleeding. Be careful brushing or flossing your teeth or using a toothpick because you may get an infection or bleed more easily. If you have any dental work done, tell your dentist you are receiving this medicine. This medicine may cause dry eyes [and blurred vision]. If you wear contact lenses, you may feel some discomfort. Lubricating eye drops may help. See your healthcare professional if the problem does not go away or is severe. Call your healthcare professional for advice if you get a fever, chills, or sore throat, or other symptoms of a cold or flu. Do not treat yourself. This medicine decreases your body's ability to fight infections. Try to  avoid being around people who are sick. Avoid taking medicines that contain aspirin, acetaminophen, ibuprofen, naproxen, or ketoprofen unless instructed by your healthcare professional. These medicines may hide a fever. What side effects may I notice from receiving this medicine? Side  effects that you should report to your doctor or health care professional as soon as possible:  allergic reactions like skin rash, itching or hives, swelling of the face, lips, or tongue  breathing problems  cough  nausea, vomiting  signs and symptoms of bleeding such as bloody or black, tarry stools; red or dark-brown urine; spitting up blood or brown material that looks like coffee grounds; red spots on the skin; unusual bruising or bleeding from the eye, gums, or nose  signs and symptoms of heart failure like breathing problems, fast, irregular heartbeat, sudden weight gain; swelling of the ankles, feet, hands; unusually weak or tired  signs and symptoms of infection like fever; chills; cough; sore throat; pain or trouble passing urine  signs and symptoms of low red blood cells or anemia such as unusually weak or tired; feeling faint or lightheaded; falls; breathing problems Side effects that usually do not require medical attention (report these to your doctor or health care professional if they continue or are bothersome):  constipation  diarrhea  dry eyes  hair loss  loss of appetite  mouth sores  rash This list may not describe all possible side effects. Call your doctor for medical advice about side effects. You may report side effects to FDA at 1-800-FDA-1088. Where should I keep my medicine? This drug is given in a hospital or clinic and will not be stored at home. NOTE: This sheet is a summary. It may not cover all possible information. If you have questions about this medicine, talk to your doctor, pharmacist, or health care provider.  2020 Elsevier/Gold Standard (2018-05-12 15:07:27)

## 2019-07-19 NOTE — Progress Notes (Signed)
Ok to treat with anc of 1.1 per Dr Marin Olp. dph

## 2019-07-30 NOTE — Progress Notes (Signed)
Pharmacist Chemotherapy Monitoring - Follow Up Assessment    I verify that I have reviewed each item in the below checklist:  . Regimen for the patient is scheduled for the appropriate day and plan matches scheduled date. Marland Kitchen Appropriate non-routine labs are ordered dependent on drug ordered. . If applicable, additional medications reviewed and ordered per protocol based on lifetime cumulative doses and/or treatment regimen.   Plan for follow-up and/or issues identified: No . I-vent associated with next due treatment: No . MD and/or nursing notified: No  Acquanetta Belling 07/30/2019 7:23 AM

## 2019-08-06 ENCOUNTER — Inpatient Hospital Stay: Payer: 59

## 2019-08-06 ENCOUNTER — Inpatient Hospital Stay (HOSPITAL_BASED_OUTPATIENT_CLINIC_OR_DEPARTMENT_OTHER): Payer: 59 | Admitting: Family

## 2019-08-06 ENCOUNTER — Telehealth: Payer: Self-pay | Admitting: Hematology & Oncology

## 2019-08-06 ENCOUNTER — Other Ambulatory Visit: Payer: Self-pay

## 2019-08-06 ENCOUNTER — Other Ambulatory Visit: Payer: Self-pay | Admitting: *Deleted

## 2019-08-06 ENCOUNTER — Encounter: Payer: Self-pay | Admitting: Family

## 2019-08-06 VITALS — BP 122/84 | HR 97 | Temp 97.3°F | Resp 18 | Ht 69.0 in | Wt 156.1 lb

## 2019-08-06 DIAGNOSIS — C160 Malignant neoplasm of cardia: Secondary | ICD-10-CM

## 2019-08-06 DIAGNOSIS — C799 Secondary malignant neoplasm of unspecified site: Secondary | ICD-10-CM | POA: Diagnosis not present

## 2019-08-06 DIAGNOSIS — Z7189 Other specified counseling: Secondary | ICD-10-CM

## 2019-08-06 DIAGNOSIS — Z5112 Encounter for antineoplastic immunotherapy: Secondary | ICD-10-CM | POA: Diagnosis not present

## 2019-08-06 LAB — CMP (CANCER CENTER ONLY)
ALT: 16 U/L (ref 0–44)
AST: 20 U/L (ref 15–41)
Albumin: 3.6 g/dL (ref 3.5–5.0)
Alkaline Phosphatase: 121 U/L (ref 38–126)
Anion gap: 7 (ref 5–15)
BUN: 12 mg/dL (ref 6–20)
CO2: 28 mmol/L (ref 22–32)
Calcium: 8.9 mg/dL (ref 8.9–10.3)
Chloride: 106 mmol/L (ref 98–111)
Creatinine: 0.92 mg/dL (ref 0.61–1.24)
GFR, Est AFR Am: >60 mL/min (ref >60)
GFR, Estimated: >60 mL/min (ref >60)
Glucose, Bld: 132 mg/dL — ABNORMAL HIGH (ref 70–99)
Potassium: 3.5 mmol/L (ref 3.5–5.1)
Sodium: 141 mmol/L (ref 135–145)
Total Bilirubin: 0.3 mg/dL (ref 0.3–1.2)
Total Protein: 6.4 g/dL — ABNORMAL LOW (ref 6.5–8.1)

## 2019-08-06 LAB — CBC WITH DIFFERENTIAL (CANCER CENTER ONLY)
Abs Immature Granulocytes: 0.02 10*3/uL (ref 0.00–0.07)
Basophils Absolute: 0 10*3/uL (ref 0.0–0.1)
Basophils Relative: 1 %
Eosinophils Absolute: 0 10*3/uL (ref 0.0–0.5)
Eosinophils Relative: 1 %
HCT: 35.5 % — ABNORMAL LOW (ref 39.0–52.0)
Hemoglobin: 12.3 g/dL — ABNORMAL LOW (ref 13.0–17.0)
Immature Granulocytes: 1 %
Lymphocytes Relative: 19 %
Lymphs Abs: 0.7 10*3/uL (ref 0.7–4.0)
MCH: 31.2 pg (ref 26.0–34.0)
MCHC: 34.6 g/dL (ref 30.0–36.0)
MCV: 90.1 fL (ref 80.0–100.0)
Monocytes Absolute: 0.4 10*3/uL (ref 0.1–1.0)
Monocytes Relative: 11 %
Neutro Abs: 2.5 10*3/uL (ref 1.7–7.7)
Neutrophils Relative %: 67 %
Platelet Count: 147 10*3/uL — ABNORMAL LOW (ref 150–400)
RBC: 3.94 MIL/uL — ABNORMAL LOW (ref 4.22–5.81)
RDW: 13.4 % (ref 11.5–15.5)
WBC Count: 3.7 10*3/uL — ABNORMAL LOW (ref 4.0–10.5)
nRBC: 0 % (ref 0.0–0.2)

## 2019-08-06 LAB — LACTATE DEHYDROGENASE: LDH: 152 U/L (ref 98–192)

## 2019-08-06 MED ORDER — ALTEPLASE 2 MG IJ SOLR
2.0000 mg | Freq: Once | INTRAMUSCULAR | Status: AC | PRN
  Administered 2019-08-06: 2 mg
  Filled 2019-08-06: qty 2

## 2019-08-06 MED ORDER — ACETAMINOPHEN 325 MG PO TABS
ORAL_TABLET | ORAL | Status: AC
  Filled 2019-08-06: qty 2

## 2019-08-06 MED ORDER — HEPARIN SOD (PORK) LOCK FLUSH 100 UNIT/ML IV SOLN
500.0000 [IU] | Freq: Once | INTRAVENOUS | Status: AC | PRN
  Administered 2019-08-06: 500 [IU]
  Filled 2019-08-06: qty 5

## 2019-08-06 MED ORDER — SODIUM CHLORIDE 0.9% FLUSH
10.0000 mL | INTRAVENOUS | Status: DC | PRN
  Administered 2019-08-06: 10 mL
  Filled 2019-08-06: qty 10

## 2019-08-06 MED ORDER — DIPHENHYDRAMINE HCL 25 MG PO CAPS
50.0000 mg | ORAL_CAPSULE | Freq: Once | ORAL | Status: AC
  Administered 2019-08-06: 50 mg via ORAL

## 2019-08-06 MED ORDER — PROCHLORPERAZINE MALEATE 10 MG PO TABS: 10.0000 mg | ORAL_TABLET | Freq: Four times a day (QID) | ORAL | 2 refills | Status: DC | PRN

## 2019-08-06 MED ORDER — STERILE WATER FOR INJECTION IJ SOLN
INTRAMUSCULAR | Status: AC
  Filled 2019-08-06: qty 10

## 2019-08-06 MED ORDER — ACETAMINOPHEN 325 MG PO TABS
650.0000 mg | ORAL_TABLET | Freq: Once | ORAL | Status: AC
  Administered 2019-08-06: 650 mg via ORAL

## 2019-08-06 MED ORDER — PALONOSETRON HCL INJECTION 0.25 MG/5ML
0.2500 mg | Freq: Once | INTRAVENOUS | Status: AC
  Administered 2019-08-06: 0.25 mg via INTRAVENOUS

## 2019-08-06 MED ORDER — ALTEPLASE 2 MG IJ SOLR
INTRAMUSCULAR | Status: AC
  Filled 2019-08-06: qty 2

## 2019-08-06 MED ORDER — DEXTROSE 5 % IV SOLN
Freq: Once | INTRAVENOUS | Status: AC
  Filled 2019-08-06: qty 250

## 2019-08-06 MED ORDER — SODIUM CHLORIDE 0.9 % IV SOLN
10.0000 mg | Freq: Once | INTRAVENOUS | Status: AC
  Administered 2019-08-06: 10 mg via INTRAVENOUS
  Filled 2019-08-06: qty 10

## 2019-08-06 MED ORDER — FAM-TRASTUZUMAB DERUXTECAN-NXKI CHEMO 100 MG IV SOLR
500.0000 mg | Freq: Once | INTRAVENOUS | Status: AC
  Administered 2019-08-06: 500 mg via INTRAVENOUS
  Filled 2019-08-06: qty 25

## 2019-08-06 MED ORDER — DIPHENHYDRAMINE HCL 25 MG PO CAPS
ORAL_CAPSULE | ORAL | Status: AC
  Filled 2019-08-06: qty 2

## 2019-08-06 MED ORDER — PALONOSETRON HCL INJECTION 0.25 MG/5ML
INTRAVENOUS | Status: AC
  Filled 2019-08-06: qty 5

## 2019-08-06 MED FILL — PROCHLORPERAZINE 10 MG TAB: 10 | 7 days supply | Qty: 30 | Fill #0

## 2019-08-06 NOTE — Telephone Encounter (Signed)
Appointments already scheduled.  Patient requested Monday or Tuesday appt after this week.  He will be on vacation the next week.per 5/21 los

## 2019-08-06 NOTE — Patient Instructions (Signed)

## 2019-08-06 NOTE — Progress Notes (Signed)
Hematology and Oncology Follow Up Visit  Matthew Reynolds [id number] [birth year] 54 y.o. 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 He is doing quite well tolerating this therapy.  He is leaving for the beach tomorrow to spend a week with his family. They are very excited about this.  He has had some fatigue at times but rests as needed.  His appetite is back and he is now able to eat a full meal again. He is hydrating well throughout the day. His weight is down 3 lbs since last visit.  No fever, chills, n/v, cough, rash, dizziness, SOB, chest pain, palpitations, abdominal pain or changes in bowel or bladder habits.  He takes Dulcolax as needed for constipation.  He has not noted any blood loss. No bruising or petechiae.  The foot drop in his right foot is stable. He is doing strengthening exercises and has started walking the neighborhood with his wife for exercise.  No swelling or tenderness in his extremities. He has mild tingling in the hands and feet that waxes and wanes.   ECOG Performance Status: 1 - Symptomatic but completely ambulatory  Medications:  Allergies as of 08/06/2019   No Known Allergies     Medication List       Accurate as of Aug 06, 2019  9:23 AM. If you have any questions, ask your nurse or doctor.        Dexilant 60 MG capsule Generic drug: dexlansoprazole Take 1  capsule (60 mg total) by mouth daily.   lidocaine-prilocaine cream Commonly known as: EMLA Apply 1 application topically as needed.   MULTI-VITAMIN DAILY PO Take by mouth.   sucralfate 1 GM/10ML suspension Commonly known as: CARAFATE Take 10 mLs (1 g total) by mouth 3 (three) times daily.       Allergies: No Known Allergies  Past Medical History, Surgical history, Social history, and Family History were reviewed and updated.  Review of Systems: All other 10 point review of systems is negative.   Physical Exam:  height is _0  (1.753 m) and weight is 156 lb 1.3 oz (70.8 kg). His temporal temperature is 97.3 F (36.3 C) (abnormal). His blood pressure is 122/84 and his pulse is 97. His respiration is 18 and oxygen saturation is 98%.   Wt Readings from Last 3 Encounters:  08/06/19 156 lb 1.3 oz (70.8 kg)  07/19/19 159 lb (72.1 kg)  06/29/19 161 lb (73 kg)    Ocular: Sclerae unicteric, pupils equal, round and reactive to light Ear-nose-throat: Oropharynx clear, dentition fair Lymphatic: No cervical or supraclavicular adenopathy Lungs no rales or rhonchi, good excursion bilaterally Heart regular rate and rhythm, no murmur appreciated Abd soft, nontender, positive bowel sounds, no liver or spleen tip palpated on exam, no fluid wave  MSK no focal spinal tenderness, no joint edema  Neuro: non-focal, well-oriented, appropriate affect Breasts: Deferred   Lab Results  Component Value Date   WBC 2.4 (L) 07/19/2019   HGB 12.6 (L) 07/19/2019   HCT 37.2 (L) 07/19/2019   MCV 91.2 07/19/2019   PLT 205 07/19/2019   Lab Results  Component Value Date   FERRITIN 215 03/25/2019   IRON 92 03/25/2019   TIBC 274 03/25/2019   UIBC 182 03/25/2019   IRONPCTSAT 34 03/25/2019   Lab Results  Component Value Date   RBC 4.08 (L) 07/19/2019   No results found for: KPAFRELGTCHN, LAMBDASER, KAPLAMBRATIO No results found for: IGGSERUM, IGA, IGMSERUM No results found for: Odetta Pink, SPEI   Chemistry      Component Value Date/Time   NA 141 07/19/2019 0830   K 3.8 07/19/2019 0830   CL 106 07/19/2019 0830   CO2 29 07/19/2019 0830   BUN 11 07/19/2019 0830   CREATININE 0.92 07/19/2019 0830      Component Value Date/Time   CALCIUM 9.3 07/19/2019 0830   ALKPHOS 131 (H) 07/19/2019 0830   AST 24 07/19/2019 0830   ALT 21 07/19/2019 0830   BILITOT 0.4 07/19/2019 0830       [name] is a very pleasant 54 yo caucasian gentleman with metastatic adenocarcinoma of the GE junction, HER-2 positive, PD-L1 positive. We will proceed with treatment today as planned and repeat a PEt scan in 3 weeks prior to his follow-up.  He will contact our office with any questions or concerns. We can certainly see him sooner if needed.   Laverna Peace, NP 5/21/20219:23 AM

## 2019-08-10 ENCOUNTER — Ambulatory Visit: Payer: 59 | Admitting: Hematology & Oncology

## 2019-08-10 ENCOUNTER — Other Ambulatory Visit: Payer: 59

## 2019-08-10 ENCOUNTER — Ambulatory Visit: Payer: 59

## 2019-08-13 ENCOUNTER — Other Ambulatory Visit: Payer: Self-pay | Admitting: Hematology & Oncology

## 2019-08-13 DIAGNOSIS — C160 Malignant neoplasm of cardia: Secondary | ICD-10-CM

## 2019-08-24 NOTE — Progress Notes (Signed)
Pharmacist Chemotherapy Monitoring - Follow Up Assessment    I verify that I have reviewed each item in the below checklist:  . Regimen for the patient is scheduled for the appropriate day and plan matches scheduled date. Marland Kitchen Appropriate non-routine labs are ordered dependent on drug ordered. . If applicable, additional medications reviewed and ordered per protocol based on lifetime cumulative doses and/or treatment regimen.   Plan for follow-up and/or issues identified: No . I-vent associated with next due treatment: No . MD and/or nursing notified: No  Kimberleigh Mehan, Jacqlyn Larsen 08/24/2019 8:11 AM

## 2019-08-25 ENCOUNTER — Other Ambulatory Visit: Payer: Self-pay

## 2019-08-25 ENCOUNTER — Ambulatory Visit (HOSPITAL_COMMUNITY)
Admission: RE | Admit: 2019-08-25 | Discharge: 2019-08-25 | Disposition: A | Discharge status: Home / Self Care | Payer: 59 | Source: Ambulatory Visit | Attending: Family | Admitting: Family

## 2019-08-25 DIAGNOSIS — C799 Secondary malignant neoplasm of unspecified site: Secondary | ICD-10-CM | POA: Diagnosis present

## 2019-08-25 DIAGNOSIS — C160 Malignant neoplasm of cardia: Secondary | ICD-10-CM | POA: Diagnosis not present

## 2019-08-25 LAB — GLUCOSE, CAPILLARY: Glucose-Capillary: 83 mg/dL (ref 70–99)

## 2019-08-25 MED ORDER — FLUDEOXYGLUCOSE F - 18 (FDG) INJECTION
7.8100 | Freq: Once | INTRAVENOUS | Status: AC
  Administered 2019-08-25: 7.81 via INTRAVENOUS

## 2019-08-26 ENCOUNTER — Telehealth: Payer: Self-pay | Admitting: *Deleted

## 2019-08-26 NOTE — Telephone Encounter (Signed)
Patient notified per order of [name] that he is "back in remission" per PET scan results.  Pt appreciative of call and has no questions at this time.

## 2019-08-27 ENCOUNTER — Other Ambulatory Visit: Payer: Self-pay | Admitting: *Deleted

## 2019-08-27 DIAGNOSIS — C160 Malignant neoplasm of cardia: Secondary | ICD-10-CM

## 2019-08-27 MED ORDER — DEXILANT 60 MG PO CPDR: 60.0000 mg | DELAYED_RELEASE_CAPSULE | Freq: Every day | ORAL | 1 refills | Status: DC

## 2019-08-31 ENCOUNTER — Inpatient Hospital Stay: Payer: 59 | Attending: Hematology & Oncology

## 2019-08-31 ENCOUNTER — Other Ambulatory Visit: Payer: Self-pay

## 2019-08-31 ENCOUNTER — Inpatient Hospital Stay: Payer: 59

## 2019-08-31 ENCOUNTER — Inpatient Hospital Stay (HOSPITAL_BASED_OUTPATIENT_CLINIC_OR_DEPARTMENT_OTHER): Payer: 59 | Admitting: Hematology & Oncology

## 2019-08-31 VITALS — BP 135/92 | HR 78 | Temp 97.1°F | Resp 18 | Wt 155.8 lb

## 2019-08-31 DIAGNOSIS — C7951 Secondary malignant neoplasm of bone: Secondary | ICD-10-CM | POA: Diagnosis not present

## 2019-08-31 DIAGNOSIS — C160 Malignant neoplasm of cardia: Secondary | ICD-10-CM

## 2019-08-31 DIAGNOSIS — Z7189 Other specified counseling: Secondary | ICD-10-CM

## 2019-08-31 DIAGNOSIS — Z79899 Other long term (current) drug therapy: Secondary | ICD-10-CM | POA: Diagnosis not present

## 2019-08-31 DIAGNOSIS — C799 Secondary malignant neoplasm of unspecified site: Secondary | ICD-10-CM

## 2019-08-31 DIAGNOSIS — Z5112 Encounter for antineoplastic immunotherapy: Secondary | ICD-10-CM | POA: Diagnosis not present

## 2019-08-31 LAB — CMP (CANCER CENTER ONLY)
ALT: 18 U/L (ref 0–44)
AST: 24 U/L (ref 15–41)
Albumin: 3.8 g/dL (ref 3.5–5.0)
Alkaline Phosphatase: 135 U/L — ABNORMAL HIGH (ref 38–126)
Anion gap: 8 (ref 5–15)
BUN: 11 mg/dL (ref 6–20)
CO2: 27 mmol/L (ref 22–32)
Calcium: 9.5 mg/dL (ref 8.9–10.3)
Chloride: 105 mmol/L (ref 98–111)
Creatinine: 0.96 mg/dL (ref 0.61–1.24)
GFR, Est AFR Am: >60 mL/min (ref >60)
GFR, Estimated: >60 mL/min (ref >60)
Glucose, Bld: 134 mg/dL — ABNORMAL HIGH (ref 70–99)
Potassium: 4 mmol/L (ref 3.5–5.1)
Sodium: 140 mmol/L (ref 135–145)
Total Bilirubin: 0.5 mg/dL (ref 0.3–1.2)
Total Protein: 6.6 g/dL (ref 6.5–8.1)

## 2019-08-31 LAB — CBC WITH DIFFERENTIAL (CANCER CENTER ONLY)
Abs Immature Granulocytes: 0.02 10*3/uL (ref 0.00–0.07)
Basophils Absolute: 0 10*3/uL (ref 0.0–0.1)
Basophils Relative: 1 %
Eosinophils Absolute: 0.1 10*3/uL (ref 0.0–0.5)
Eosinophils Relative: 2 %
HCT: 41.4 % (ref 39.0–52.0)
Hemoglobin: 13.9 g/dL (ref 13.0–17.0)
Immature Granulocytes: 1 %
Lymphocytes Relative: 20 %
Lymphs Abs: 0.7 10*3/uL (ref 0.7–4.0)
MCH: 30.8 pg (ref 26.0–34.0)
MCHC: 33.6 g/dL (ref 30.0–36.0)
MCV: 91.6 fL (ref 80.0–100.0)
Monocytes Absolute: 0.4 10*3/uL (ref 0.1–1.0)
Monocytes Relative: 13 %
Neutro Abs: 2.2 10*3/uL (ref 1.7–7.7)
Neutrophils Relative %: 63 %
Platelet Count: 145 10*3/uL — ABNORMAL LOW (ref 150–400)
RBC: 4.52 MIL/uL (ref 4.22–5.81)
RDW: 15.3 % (ref 11.5–15.5)
WBC Count: 3.5 10*3/uL — ABNORMAL LOW (ref 4.0–10.5)
nRBC: 0 % (ref 0.0–0.2)

## 2019-08-31 MED ORDER — DEXTROSE 5 % IV SOLN
Freq: Once | INTRAVENOUS | Status: AC
  Filled 2019-08-31: qty 250

## 2019-08-31 MED ORDER — PALONOSETRON HCL INJECTION 0.25 MG/5ML
INTRAVENOUS | Status: AC
  Filled 2019-08-31: qty 5

## 2019-08-31 MED ORDER — DEXILANT 60 MG PO CPDR: 60.0000 mg | DELAYED_RELEASE_CAPSULE | Freq: Every day | ORAL | 1 refills | Status: DC

## 2019-08-31 MED ORDER — ACETAMINOPHEN 325 MG PO TABS
650.0000 mg | ORAL_TABLET | Freq: Once | ORAL | Status: AC
  Administered 2019-08-31: 650 mg via ORAL

## 2019-08-31 MED ORDER — DIPHENHYDRAMINE HCL 25 MG PO CAPS
ORAL_CAPSULE | ORAL | Status: AC
  Filled 2019-08-31: qty 2

## 2019-08-31 MED ORDER — DIPHENHYDRAMINE HCL 25 MG PO CAPS
50.0000 mg | ORAL_CAPSULE | Freq: Once | ORAL | Status: AC
  Administered 2019-08-31: 50 mg via ORAL

## 2019-08-31 MED ORDER — PALONOSETRON HCL INJECTION 0.25 MG/5ML
0.2500 mg | Freq: Once | INTRAVENOUS | Status: AC
  Administered 2019-08-31: 0.25 mg via INTRAVENOUS

## 2019-08-31 MED ORDER — ACETAMINOPHEN 325 MG PO TABS
ORAL_TABLET | ORAL | Status: AC
  Filled 2019-08-31: qty 2

## 2019-08-31 MED ORDER — SODIUM CHLORIDE 0.9 % IV SOLN
10.0000 mg | Freq: Once | INTRAVENOUS | Status: AC
  Administered 2019-08-31: 10 mg via INTRAVENOUS
  Filled 2019-08-31: qty 1

## 2019-08-31 MED ORDER — FAM-TRASTUZUMAB DERUXTECAN-NXKI CHEMO 100 MG IV SOLR
500.0000 mg | Freq: Once | INTRAVENOUS | Status: AC
  Administered 2019-08-31: 500 mg via INTRAVENOUS
  Filled 2019-08-31: qty 25

## 2019-08-31 MED ORDER — HEPARIN SOD (PORK) LOCK FLUSH 100 UNIT/ML IV SOLN
500.0000 [IU] | Freq: Once | INTRAVENOUS | Status: AC | PRN
  Administered 2019-08-31: 500 [IU]
  Filled 2019-08-31: qty 5

## 2019-08-31 MED ORDER — SODIUM CHLORIDE 0.9% FLUSH
10.0000 mL | INTRAVENOUS | Status: DC | PRN
  Administered 2019-08-31: 10 mL
  Filled 2019-08-31: qty 10

## 2019-08-31 MED FILL — DEXILANT DR 60 MG CAPSULE: 60 | 30 days supply | Qty: 30 | Fill #0

## 2019-08-31 NOTE — Progress Notes (Signed)
Hematology and Oncology Follow Up Visit  JAMA KRICHBAUM [id number] 08-24-1965 54 y.o. 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 He is doing quite well tolerating this therapy.   So far, everything is going quite nicely with the Enhertu.  We did do a PET scan on him.  This done on August 25, 2019.  The PET scan showed that areas of activity are no longer present.  There is no new areas that have been noted.  I am just very happy that he is done so well.  He tolerated treatment nicely.  So far, there has been no side effects.  He said no cough or shortness of breath.  He has had no bleeding.  There is been no diarrhea.  He swallowing without any dysphagia or odynophagia.  He has had no headache.  He has had no rashes.  There is been no leg swelling.  Overall, he has a performance status of ECOG 0.   Medications:  Allergies as of 08/31/2019   No Known Allergies     Medication List       Accurate as of August 31, 2019 10:38 AM. If you have any questions, ask your nurse or doctor.        Dexilant 60 MG capsule Generic drug: dexlansoprazole Take 1 capsule (60 mg total) by mouth daily.   lidocaine-prilocaine cream Commonly known as: EMLA Apply 1 application topically as needed.   MULTI-VITAMIN DAILY PO Take by mouth.   prochlorperazine 10 MG tablet Commonly known as:  COMPAZINE Take 1 tablet (10 mg total) by mouth every 6 (six) hours as needed (Nausea or vomiting).   sucralfate 1 GM/10ML suspension Commonly known as: CARAFATE Take 10 mLs (1 g total) by mouth 3 (three) times daily.       Allergies: No Known Allergies  Past Medical History, Surgical history, Social history, and Family History were reviewed and updated.  Review of Systems: Review of Systems  Constitutional: Negative.   HENT: Negative.   Eyes: Negative.   Respiratory: Negative.   Cardiovascular: Negative.   Gastrointestinal: Negative.   Genitourinary: Negative.   Musculoskeletal: Negative.   Skin: Negative.   Neurological: Negative.   Endo/Heme/Allergies: Negative.   Psychiatric/Behavioral: Negative.       Physical Exam:  weight is 155 lb 12 oz (70.6 kg). His temporal temperature is 97.1 F (36.2 C) (abnormal). His blood pressure is 135/92 (abnormal) and his pulse is 78. His respiration is 18 and oxygen saturation is 98%.   Wt Readings from Last 3 Encounters:  08/31/19 155 lb 12 oz (70.6 kg)  08/06/19 156 lb 1.3 oz (70.8 kg)  07/19/19 159 lb (72.1 kg)    Physical Exam Vitals reviewed.  HENT:     Head: Normocephalic and atraumatic.  Eyes:  Pupils: Pupils are equal, round, and reactive to light.  Cardiovascular:     Rate and Rhythm: Normal rate and regular rhythm.     Heart sounds: Normal heart sounds.  Pulmonary:     Effort: Pulmonary effort is normal.     Breath sounds: Normal breath sounds.  Abdominal:     General: Bowel sounds are normal.     Palpations: Abdomen is soft.  Musculoskeletal:        General: No tenderness or deformity. Normal range of motion.     Cervical back: Normal range of motion.  Lymphadenopathy:     Cervical: No cervical adenopathy.  Skin:    General: Skin is warm and dry.     Findings: No erythema or rash.  Neurological:     Mental Status: He is alert and oriented to person, place, and time.  Psychiatric:        Behavior:  Behavior normal.        Thought Content: Thought content normal.        Judgment: Judgment normal.      Lab Results  Component Value Date   WBC 3.7 (L) 08/06/2019   HGB 12.3 (L) 08/06/2019   HCT 35.5 (L) 08/06/2019   MCV 90.1 08/06/2019   PLT 147 (L) 08/06/2019   Lab Results  Component Value Date   FERRITIN 215 03/25/2019   IRON 92 03/25/2019   TIBC 274 03/25/2019   UIBC 182 03/25/2019   IRONPCTSAT 34 03/25/2019   Lab Results  Component Value Date   RBC 3.94 (L) 08/06/2019   No results found for: KPAFRELGTCHN, LAMBDASER, KAPLAMBRATIO No results found for: IGGSERUM, IGA, IGMSERUM No results found for: Odetta Pink, SPEI   Chemistry      Component Value Date/Time   NA 141 08/06/2019 0941   K 3.5 08/06/2019 0941   CL 106 08/06/2019 0941   CO2 28 08/06/2019 0941   BUN 12 08/06/2019 0941   CREATININE 0.92 08/06/2019 0941      Component Value Date/Time   CALCIUM 8.9 08/06/2019 0941   ALKPHOS 121 08/06/2019 0941   AST 20 08/06/2019 0941   ALT 16 08/06/2019 0941   BILITOT 0.3 08/06/2019 0941       Impression and Plan: Mr. Mallon is a very pleasant 54 yo caucasian gentleman with metastatic adenocarcinoma of the GE junction, HER-2 positive, PD-L1 positive.  As I anticipated, his cancer is still being driven by the fact that his tumor is HER-2 positive.  He is responding very nicely to the Enhertu.  Hopefully the response will be maintained.  I do not think we need to do another PET scan probably until the fall.  We will continue the Enhertu every 3 weeks.  I think this is a good timeframe for him.  He is planning on going to the beach with his family in early August.  We can work around that if necessary. Marland Kitchen   [name], MD 6/15/202110:38 AM

## 2019-08-31 NOTE — Patient Instructions (Signed)
Implanted Port Insertion, Care After °This sheet gives you information about how to care for yourself after your procedure. Your health care provider may also give you more specific instructions. If you have problems or questions, contact your health care provider. °What can I expect after the procedure? °After the procedure, it is common to have: °· Discomfort at the port insertion site. °· Bruising on the skin over the port. This should improve over 3-4 days. °Follow these instructions at home: °Port care °· After your port is placed, you will get a manufacturer's information card. The card has information about your port. Keep this card with you at all times. °· Take care of the port as told by your health care provider. Ask your health care provider if you or a family member can get training for taking care of the port at home. A home health care nurse may also take care of the port. °· Make sure to remember what type of port you have. °Incision care ° °  ° °· Follow instructions from your health care provider about how to take care of your port insertion site. Make sure you: °? Wash your hands with soap and water before and after you change your bandage (dressing). If soap and water are not available, use hand sanitizer. °? Change your dressing as told by your health care provider. °? Leave stitches (sutures), skin glue, or adhesive strips in place. These skin closures may need to stay in place for 2 weeks or longer. If adhesive strip edges start to loosen and curl up, you may trim the loose edges. Do not remove adhesive strips completely unless your health care provider tells you to do that. °· Check your port insertion site every day for signs of infection. Check for: °? Redness, swelling, or pain. °? Fluid or blood. °? Warmth. °? Pus or a bad smell. °Activity °· Return to your normal activities as told by your health care provider. Ask your health care provider what activities are safe for you. °· Do not  lift anything that is heavier than 10 lb (4.5 kg), or the limit that you are told, until your health care provider says that it is safe. °General instructions °· Take over-the-counter and prescription medicines only as told by your health care provider. °· Do not take baths, swim, or use a hot tub until your health care provider approves. Ask your health care provider if you may take showers. You may only be allowed to take sponge baths. °· Do not drive for 24 hours if you were given a sedative during your procedure. °· Wear a medical alert bracelet in case of an emergency. This will tell any health care providers that you have a port. °· Keep all follow-up visits as told by your health care provider. This is important. °Contact a health care provider if: °· You cannot flush your port with saline as directed, or you cannot draw blood from the port. °· You have a fever or chills. °· You have redness, swelling, or pain around your port insertion site. °· You have fluid or blood coming from your port insertion site. °· Your port insertion site feels warm to the touch. °· You have pus or a bad smell coming from the port insertion site. °Get help right away if: °· You have chest pain or shortness of breath. °· You have bleeding from your port that you cannot control. °Summary °· Take care of the port as told by your health   care provider. Keep the manufacturer's information card with you at all times. °· Change your dressing as told by your health care provider. °· Contact a health care provider if you have a fever or chills or if you have redness, swelling, or pain around your port insertion site. °· Keep all follow-up visits as told by your health care provider. °This information is not intended to replace advice given to you by your health care provider. Make sure you discuss any questions you have with your health care provider. °Document Revised: 09/30/2017 Document Reviewed: 09/30/2017 °Elsevier Patient Education ©  2020 [employer] Inc[employer] ° °

## 2019-09-01 LAB — LACTATE DEHYDROGENASE: LDH: 153 U/L (ref 98–192)

## 2019-09-15 ENCOUNTER — Encounter: Payer: Self-pay | Admitting: *Deleted

## 2019-09-21 ENCOUNTER — Inpatient Hospital Stay (HOSPITAL_BASED_OUTPATIENT_CLINIC_OR_DEPARTMENT_OTHER): Payer: 59 | Admitting: Hematology & Oncology

## 2019-09-21 ENCOUNTER — Inpatient Hospital Stay: Payer: 59 | Attending: Hematology & Oncology

## 2019-09-21 ENCOUNTER — Other Ambulatory Visit: Payer: Self-pay

## 2019-09-21 ENCOUNTER — Encounter: Payer: Self-pay | Admitting: Hematology & Oncology

## 2019-09-21 ENCOUNTER — Inpatient Hospital Stay: Payer: 59

## 2019-09-21 VITALS — BP 116/79 | HR 69 | Temp 98.1°F | Resp 15 | Wt 156.0 lb

## 2019-09-21 DIAGNOSIS — Z5112 Encounter for antineoplastic immunotherapy: Secondary | ICD-10-CM | POA: Diagnosis not present

## 2019-09-21 DIAGNOSIS — C160 Malignant neoplasm of cardia: Secondary | ICD-10-CM

## 2019-09-21 DIAGNOSIS — Z79899 Other long term (current) drug therapy: Secondary | ICD-10-CM | POA: Insufficient documentation

## 2019-09-21 DIAGNOSIS — Z7189 Other specified counseling: Secondary | ICD-10-CM

## 2019-09-21 LAB — CBC WITH DIFFERENTIAL (CANCER CENTER ONLY)
Abs Immature Granulocytes: 0.02 10*3/uL (ref 0.00–0.07)
Basophils Absolute: 0 10*3/uL (ref 0.0–0.1)
Basophils Relative: 1 %
Eosinophils Absolute: 0.1 10*3/uL (ref 0.0–0.5)
Eosinophils Relative: 3 %
HCT: 39.6 % (ref 39.0–52.0)
Hemoglobin: 13.6 g/dL (ref 13.0–17.0)
Immature Granulocytes: 1 %
Lymphocytes Relative: 25 %
Lymphs Abs: 0.7 10*3/uL (ref 0.7–4.0)
MCH: 31.5 pg (ref 26.0–34.0)
MCHC: 34.3 g/dL (ref 30.0–36.0)
MCV: 91.7 fL (ref 80.0–100.0)
Monocytes Absolute: 0.4 10*3/uL (ref 0.1–1.0)
Monocytes Relative: 14 %
Neutro Abs: 1.7 10*3/uL (ref 1.7–7.7)
Neutrophils Relative %: 56 %
Platelet Count: 153 10*3/uL (ref 150–400)
RBC: 4.32 MIL/uL (ref 4.22–5.81)
RDW: 16 % — ABNORMAL HIGH (ref 11.5–15.5)
WBC Count: 3 10*3/uL — ABNORMAL LOW (ref 4.0–10.5)
nRBC: 0 % (ref 0.0–0.2)

## 2019-09-21 LAB — CMP (CANCER CENTER ONLY)
ALT: 17 U/L (ref 0–44)
AST: 23 U/L (ref 15–41)
Albumin: 3.7 g/dL (ref 3.5–5.0)
Alkaline Phosphatase: 126 U/L (ref 38–126)
Anion gap: 7 (ref 5–15)
BUN: 11 mg/dL (ref 6–20)
CO2: 27 mmol/L (ref 22–32)
Calcium: 9.7 mg/dL (ref 8.9–10.3)
Chloride: 105 mmol/L (ref 98–111)
Creatinine: 0.9 mg/dL (ref 0.61–1.24)
GFR, Est AFR Am: >60 mL/min (ref >60)
GFR, Estimated: >60 mL/min (ref >60)
Glucose, Bld: 127 mg/dL — ABNORMAL HIGH (ref 70–99)
Potassium: 3.9 mmol/L (ref 3.5–5.1)
Sodium: 139 mmol/L (ref 135–145)
Total Bilirubin: 0.5 mg/dL (ref 0.3–1.2)
Total Protein: 6.5 g/dL (ref 6.5–8.1)

## 2019-09-21 LAB — LACTATE DEHYDROGENASE: LDH: 164 U/L (ref 98–192)

## 2019-09-21 MED ORDER — DIPHENHYDRAMINE HCL 25 MG PO CAPS
50.0000 mg | ORAL_CAPSULE | Freq: Once | ORAL | Status: AC
  Administered 2019-09-21: 50 mg via ORAL

## 2019-09-21 MED ORDER — SODIUM CHLORIDE 0.9% FLUSH
10.0000 mL | INTRAVENOUS | Status: DC | PRN
  Administered 2019-09-21: 10 mL
  Filled 2019-09-21: qty 10

## 2019-09-21 MED ORDER — DEXTROSE 5 % IV SOLN
Freq: Once | INTRAVENOUS | Status: AC
  Filled 2019-09-21: qty 250

## 2019-09-21 MED ORDER — SODIUM CHLORIDE 0.9 % IV SOLN
10.0000 mg | Freq: Once | INTRAVENOUS | Status: AC
  Administered 2019-09-21: 10 mg via INTRAVENOUS
  Filled 2019-09-21: qty 10

## 2019-09-21 MED ORDER — FAM-TRASTUZUMAB DERUXTECAN-NXKI CHEMO 100 MG IV SOLR
500.0000 mg | Freq: Once | INTRAVENOUS | Status: AC
  Administered 2019-09-21: 500 mg via INTRAVENOUS
  Filled 2019-09-21: qty 25

## 2019-09-21 MED ORDER — PALONOSETRON HCL INJECTION 0.25 MG/5ML
0.2500 mg | Freq: Once | INTRAVENOUS | Status: AC
  Administered 2019-09-21: 0.25 mg via INTRAVENOUS

## 2019-09-21 MED ORDER — PALONOSETRON HCL INJECTION 0.25 MG/5ML
INTRAVENOUS | Status: AC
  Filled 2019-09-21: qty 5

## 2019-09-21 MED ORDER — DIPHENHYDRAMINE HCL 25 MG PO CAPS
ORAL_CAPSULE | ORAL | Status: AC
  Filled 2019-09-21: qty 2

## 2019-09-21 MED ORDER — HEPARIN SOD (PORK) LOCK FLUSH 100 UNIT/ML IV SOLN
500.0000 [IU] | Freq: Once | INTRAVENOUS | Status: AC | PRN
  Administered 2019-09-21: 500 [IU]
  Filled 2019-09-21: qty 5

## 2019-09-21 MED ORDER — ACETAMINOPHEN 325 MG PO TABS
650.0000 mg | ORAL_TABLET | Freq: Once | ORAL | Status: AC
  Administered 2019-09-21: 650 mg via ORAL

## 2019-09-21 MED ORDER — ACETAMINOPHEN 325 MG PO TABS
ORAL_TABLET | ORAL | Status: AC
  Filled 2019-09-21: qty 2

## 2019-09-21 NOTE — Progress Notes (Signed)
Hematology and Oncology Follow Up Visit  Matthew Reynolds [id number] December 22, 1965 54 y.o. 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 He is doing quite well tolerating this therapy.  His quality life is doing quite well.  Very happy about this.  He had a wonderful July 4 weekend holiday.  [place], [place]  They will be going sailing and then going to the beach for a few days.  He has had no problems eating.  There is no dysphagia or odynophagia.  He has had no nausea or vomiting.  There is no cough or shortness of breath.  There is no chest wall pain.  He has had no bleeding.  There is no change in bowel or bladder habits.  Overall, he has a performance status of ECOG 0.   Medications:  Allergies as of 09/21/2019   No Known Allergies     Medication List       Accurate as of September 21, 2019 11:40 AM. If you have any questions, ask your nurse or doctor.        Dexilant 60 MG capsule Generic drug: dexlansoprazole Take 1 capsule (60 mg total) by mouth daily.   lidocaine-prilocaine cream Commonly known as: EMLA Apply 1 application topically as needed.   MULTI-VITAMIN DAILY PO Take by mouth.   prochlorperazine 10 MG tablet Commonly known as: COMPAZINE Take 1 tablet (10 mg total) by mouth every 6 (six) hours as needed (Nausea or  vomiting).   sucralfate 1 GM/10ML suspension Commonly known as: CARAFATE Take 10 mLs (1 g total) by mouth 3 (three) times daily.       Allergies: No Known Allergies  Past Medical History, Surgical history, Social history, and Family History were reviewed and updated.  Review of Systems: Review of Systems  Constitutional: Negative.   HENT: Negative.   Eyes: Negative.   Respiratory: Negative.   Cardiovascular: Negative.   Gastrointestinal: Negative.   Genitourinary: Negative.   Musculoskeletal: Negative.   Skin: Negative.   Neurological: Negative.   Endo/Heme/Allergies: Negative.   Psychiatric/Behavioral: Negative.       Physical Exam:  weight is 156 lb (70.8 kg). His oral temperature is 98.1 F (36.7 C). His blood pressure is 116/79 and his pulse is 69. His respiration is 15 and oxygen saturation is 100%.   Wt Readings from Last 3 Encounters:  09/21/19 156 lb (70.8 kg)  08/31/19 155 lb 12 oz (70.6 kg)  08/06/19 156 lb 1.3 oz (70.8 kg)    Physical Exam Vitals reviewed.  HENT:     Head: Normocephalic and atraumatic.  Eyes:     Pupils: Pupils are equal, round, and reactive to light.  Cardiovascular:     Rate and Rhythm: Normal rate and regular rhythm.  Heart sounds: Normal heart sounds.  Pulmonary:     Effort: Pulmonary effort is normal.     Breath sounds: Normal breath sounds.  Abdominal:     General: Bowel sounds are normal.     Palpations: Abdomen is soft.  Musculoskeletal:        General: No tenderness or deformity. Normal range of motion.     Cervical back: Normal range of motion.  Lymphadenopathy:     Cervical: No cervical adenopathy.  Skin:    General: Skin is warm and dry.     Findings: No erythema or rash.  Neurological:     Mental Status: He is alert and oriented to person, place, and time.  Psychiatric:        Behavior: Behavior normal.        Thought Content: Thought content normal.        Judgment: Judgment normal.      Lab Results    Component Value Date   WBC 3.0 (L) 09/21/2019   HGB 13.6 09/21/2019   HCT 39.6 09/21/2019   MCV 91.7 09/21/2019   PLT 153 09/21/2019   Lab Results  Component Value Date   FERRITIN 215 03/25/2019   IRON 92 03/25/2019   TIBC 274 03/25/2019   UIBC 182 03/25/2019   IRONPCTSAT 34 03/25/2019   Lab Results  Component Value Date   RBC 4.32 09/21/2019   No results found for: KPAFRELGTCHN, LAMBDASER, KAPLAMBRATIO No results found for: IGGSERUM, IGA, IGMSERUM No results found for: Odetta Pink, SPEI   Chemistry      Component Value Date/Time   NA 139 09/21/2019 1020   K 3.9 09/21/2019 1020   CL 105 09/21/2019 1020   CO2 27 09/21/2019 1020   BUN 11 09/21/2019 1020   CREATININE 0.90 09/21/2019 1020      Component Value Date/Time   CALCIUM 9.7 09/21/2019 1020   ALKPHOS 126 09/21/2019 1020   AST 23 09/21/2019 1020   ALT 17 09/21/2019 1020   BILITOT 0.5 09/21/2019 1020       [name] is a very pleasant 54 yo caucasian gentleman with metastatic adenocarcinoma of the GE junction, HER-2 positive, PD-L1 positive.  As I anticipated, his cancer is still being driven by the fact that his tumor is HER-2 positive.  He is responding very nicely to the Enhertu.  Hopefully the response will be maintained.  Of note, pretty sure that the FDA recently approved the use of immunotherapy with anti-HER-2 therapy.  We will go ahead and plan to get back in another 3 weeks.Marland Kitchen   [name], MD 7/6/202111:40 AM

## 2019-09-21 NOTE — Patient Instructions (Signed)
Fam-Trastuzumab deruxtecan injection What is this medicine? TRASTUZUMAB DERUXTECAN (tras TOOZ eu mab DER ux TEE kan) is a monoclonal antibody combined with chemotherapy. It is used to treat breast cancer. This medicine may be used for other purposes; ask your health care provider or pharmacist if you have questions. COMMON BRAND NAME(S): ENHERTU What should I tell my health care provider before I take this medicine? They need to know if you have any of these conditions:  heart disease  heart failure  infection (especially a virus infection such as chickenpox, cold sores, or herpes)  liver disease  lung or breathing disease, like asthma  an unusual or allergic reaction to fam-trastuzumab deruxtecan, other medications, foods, dyes, or preservatives  pregnant or trying to get pregnant  breast-feeding How should I use this medicine? This medicine is for infusion into a vein. It is given by a health care professional in a hospital or clinic setting. Talk to your pediatrician regarding the use of this medicine in children. Special care may be needed. Overdosage: If you think you have taken too much of this medicine contact a poison control center or emergency room at once. NOTE: This medicine is only for you. Do not share this medicine with others. What if I miss a dose? It is important not to miss your dose. Call your doctor or health care professional if you are unable to keep an appointment. What may interact with this medicine? Interaction studies have not been performed. This list may not describe all possible interactions. Give your health care provider a list of all the medicines, herbs, non-prescription drugs, or dietary supplements you use. Also tell them if you smoke, drink alcohol, or use illegal drugs. Some items may interact with your medicine. What should I watch for while using this medicine? Visit your healthcare professional for regular checks on your progress. Tell your  healthcare professional if your symptoms do not start to get better or if they get worse. Your condition will be monitored carefully while you are receiving this medicine. Do not become pregnant while taking this medicine or for 7 months after stopping it. Women should inform their healthcare professional if they wish to become pregnant or think they might be pregnant. Men should not father a child while taking this medicine and for 4 months after stopping it. There is potential for serious side effects to an unborn child. Talk to your healthcare professional for more information. Do not breast-feed an infant while taking this medicine or for 7 months after the last dose. This medicine has caused decreased sperm counts in some men. This may make it more difficult to father a child. Talk to your healthcare professional if you are concerned about your fertility. This medicine may increase your risk to bruise or bleed. Call your health care professional if you notice any unusual bleeding. Be careful brushing or flossing your teeth or using a toothpick because you may get an infection or bleed more easily. If you have any dental work done, tell your dentist you are receiving this medicine. This medicine may cause dry eyes [and blurred vision]. If you wear contact lenses, you may feel some discomfort. Lubricating eye drops may help. See your healthcare professional if the problem does not go away or is severe. Call your healthcare professional for advice if you get a fever, chills, or sore throat, or other symptoms of a cold or flu. Do not treat yourself. This medicine decreases your body's ability to fight infections. Try to  avoid being around people who are sick. Avoid taking medicines that contain aspirin, acetaminophen, ibuprofen, naproxen, or ketoprofen unless instructed by your healthcare professional. These medicines may hide a fever. What side effects may I notice from receiving this medicine? Side  effects that you should report to your doctor or health care professional as soon as possible:  allergic reactions like skin rash, itching or hives, swelling of the face, lips, or tongue  breathing problems  cough  nausea, vomiting  signs and symptoms of bleeding such as bloody or black, tarry stools; red or dark-brown urine; spitting up blood or brown material that looks like coffee grounds; red spots on the skin; unusual bruising or bleeding from the eye, gums, or nose  signs and symptoms of heart failure like breathing problems, fast, irregular heartbeat, sudden weight gain; swelling of the ankles, feet, hands; unusually weak or tired  signs and symptoms of infection like fever; chills; cough; sore throat; pain or trouble passing urine  signs and symptoms of low red blood cells or anemia such as unusually weak or tired; feeling faint or lightheaded; falls; breathing problems Side effects that usually do not require medical attention (report these to your doctor or health care professional if they continue or are bothersome):  constipation  diarrhea  dry eyes  hair loss  loss of appetite  mouth sores  rash This list may not describe all possible side effects. Call your doctor for medical advice about side effects. You may report side effects to FDA at 1-800-FDA-1088. Where should I keep my medicine? This drug is given in a hospital or clinic and will not be stored at home. NOTE: This sheet is a summary. It may not cover all possible information. If you have questions about this medicine, talk to your doctor, pharmacist, or health care provider.  2020 Elsevier/Gold Standard (2018-05-12 15:07:27)

## 2019-09-21 NOTE — Patient Instructions (Signed)

## 2019-09-30 IMAGING — XA IR FLUORO GUIDE CV LINE*L*
1 series · 2 of 2 positions shown · non-contrast
Comparison: none

INDICATION: 52-year-old male with a history of esophageal carcinoma

[Series 1: fl (-) angio · 2 of 2 slices shown]
[im 1/2]
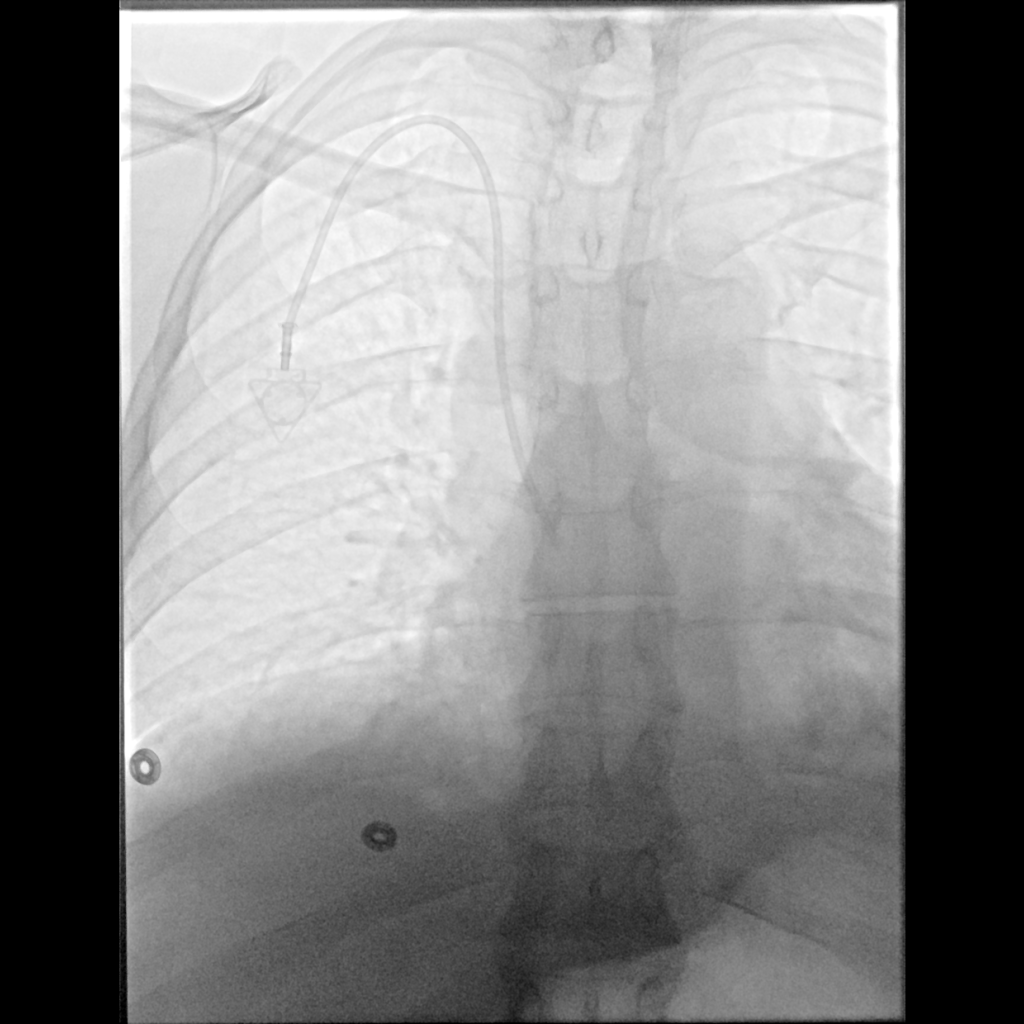
[im 2/2]
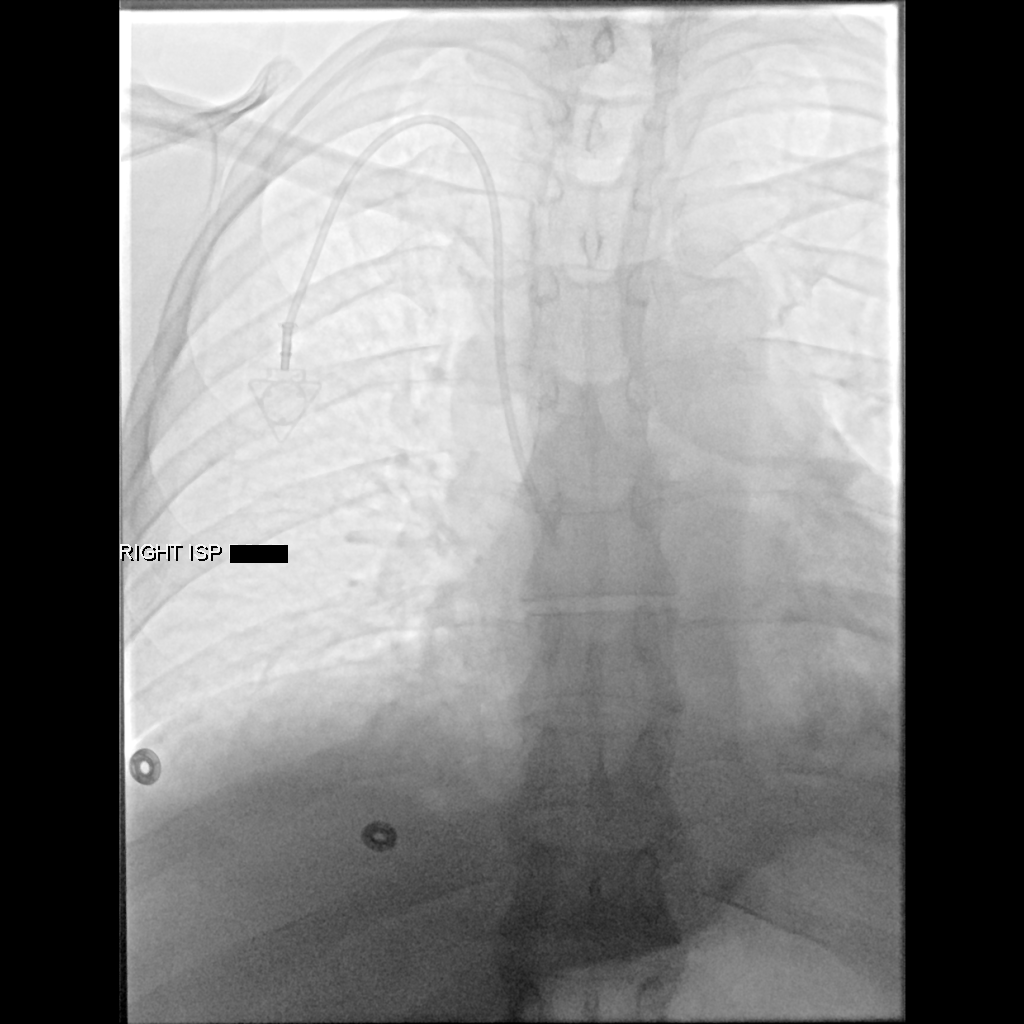

[2 of 2 positions shown; findings below may reference images not displayed]

EXAM:
IMPLANTED PORT A CATH PLACEMENT WITH ULTRASOUND AND FLUOROSCOPIC
GUIDANCE

MEDICATIONS:
2.0 g Ancef; The antibiotic was administered within an appropriate
time interval prior to skin puncture.

ANESTHESIA/SEDATION:
Moderate (conscious) sedation was employed during this procedure. A
total of Versed 2.5 mg and Fentanyl 100 mcg was administered
intravenously.

Moderate Sedation Time: 15 minutes. The patient's level of
consciousness and vital signs were monitored continuously by
radiology nursing throughout the procedure under my direct
supervision.

FLUOROSCOPY TIME:  0 minutes, 6 seconds (1 mGy)

COMPLICATIONS:
None

PROCEDURE:
The procedure, risks, benefits, and alternatives were explained to
the patient. Questions regarding the procedure were encouraged and
answered. The patient understands and consents to the procedure.

Ultrasound survey was performed with images stored and sent to PACs.

The right neck and chest was prepped with chlorhexidine, and draped
in the usual sterile fashion using maximum barrier technique (cap
and mask, sterile gown, sterile gloves, large sterile sheet, hand
hygiene and cutaneous antiseptic). Antibiotic prophylaxis was
provided with 2.0g Ancef administered IV one hour prior to skin
incision. Local anesthesia was attained by infiltration with 1%
lidocaine without epinephrine.

Ultrasound demonstrated patency of the right internal jugular vein,
and this was documented with an image. Under real-time ultrasound
guidance, this vein was accessed with a 21 gauge micropuncture
needle and image documentation was performed. A small dermatotomy
was made at the access site with an 11 scalpel. A 0.018" wire was
advanced into the SVC and used to estimate the length of the
internal catheter. The access needle exchanged for a 4F
micropuncture vascular sheath. The 0.018" wire was then removed and
a 0.035" wire advanced into the IVC.



The venous access site was then serially dilated and a peel away
vascular sheath placed over the wire. The wire was removed and the
port catheter advanced into position under fluoroscopic guidance.
The catheter tip is positioned in the cavoatrial junction. This was
documented with a spot image. The portacatheter was then tested and
found to flush and aspirate well. The port was flushed with saline
followed by 100 units/mL heparinized saline.

The pocket was then closed in two layers using first subdermal
inverted interrupted absorbable sutures followed by a running
subcuticular suture. The epidermis was then sealed with Dermabond.
The dermatotomy at the venous access site was also seal with
Dermabond.

Patient tolerated the procedure well and remained hemodynamically
stable throughout.

No complications encountered and no significant blood loss
encountered
IMPRESSION: Status post right IJ port catheter placement. Catheter ready for
use.

## 2019-10-11 ENCOUNTER — Inpatient Hospital Stay (HOSPITAL_BASED_OUTPATIENT_CLINIC_OR_DEPARTMENT_OTHER): Payer: 59 | Admitting: Hematology & Oncology

## 2019-10-11 ENCOUNTER — Inpatient Hospital Stay: Payer: 59

## 2019-10-11 ENCOUNTER — Telehealth: Payer: Self-pay | Admitting: Hematology & Oncology

## 2019-10-11 ENCOUNTER — Encounter: Payer: Self-pay | Admitting: Hematology & Oncology

## 2019-10-11 ENCOUNTER — Other Ambulatory Visit: Payer: Self-pay

## 2019-10-11 VITALS — BP 111/69 | HR 64 | Temp 98.5°F | Resp 17 | Wt 155.0 lb

## 2019-10-11 DIAGNOSIS — Z7189 Other specified counseling: Secondary | ICD-10-CM

## 2019-10-11 DIAGNOSIS — C160 Malignant neoplasm of cardia: Secondary | ICD-10-CM

## 2019-10-11 LAB — CMP (CANCER CENTER ONLY)
ALT: 17 U/L (ref 0–44)
AST: 23 U/L (ref 15–41)
Albumin: 3.8 g/dL (ref 3.5–5.0)
Alkaline Phosphatase: 120 U/L (ref 38–126)
Anion gap: 6 (ref 5–15)
BUN: 15 mg/dL (ref 6–20)
CO2: 28 mmol/L (ref 22–32)
Calcium: 9.3 mg/dL (ref 8.9–10.3)
Chloride: 106 mmol/L (ref 98–111)
Creatinine: 0.9 mg/dL (ref 0.61–1.24)
GFR, Est AFR Am: >60 mL/min (ref >60)
GFR, Estimated: >60 mL/min (ref >60)
Glucose, Bld: 106 mg/dL — ABNORMAL HIGH (ref 70–99)
Potassium: 3.7 mmol/L (ref 3.5–5.1)
Sodium: 140 mmol/L (ref 135–145)
Total Bilirubin: 0.4 mg/dL (ref 0.3–1.2)
Total Protein: 6.4 g/dL — ABNORMAL LOW (ref 6.5–8.1)

## 2019-10-11 LAB — CBC WITH DIFFERENTIAL (CANCER CENTER ONLY)
Abs Immature Granulocytes: 0.02 10*3/uL (ref 0.00–0.07)
Basophils Absolute: 0 10*3/uL (ref 0.0–0.1)
Basophils Relative: 1 %
Eosinophils Absolute: 0.1 10*3/uL (ref 0.0–0.5)
Eosinophils Relative: 3 %
HCT: 38.7 % — ABNORMAL LOW (ref 39.0–52.0)
Hemoglobin: 13.2 g/dL (ref 13.0–17.0)
Immature Granulocytes: 1 %
Lymphocytes Relative: 24 %
Lymphs Abs: 0.6 10*3/uL — ABNORMAL LOW (ref 0.7–4.0)
MCH: 31.7 pg (ref 26.0–34.0)
MCHC: 34.1 g/dL (ref 30.0–36.0)
MCV: 92.8 fL (ref 80.0–100.0)
Monocytes Absolute: 0.4 10*3/uL (ref 0.1–1.0)
Monocytes Relative: 17 %
Neutro Abs: 1.4 10*3/uL — ABNORMAL LOW (ref 1.7–7.7)
Neutrophils Relative %: 54 %
Platelet Count: 143 10*3/uL — ABNORMAL LOW (ref 150–400)
RBC: 4.17 MIL/uL — ABNORMAL LOW (ref 4.22–5.81)
RDW: 16.4 % — ABNORMAL HIGH (ref 11.5–15.5)
WBC Count: 2.5 10*3/uL — ABNORMAL LOW (ref 4.0–10.5)
nRBC: 0 % (ref 0.0–0.2)

## 2019-10-11 LAB — LACTATE DEHYDROGENASE: LDH: 165 U/L (ref 98–192)

## 2019-10-11 MED ORDER — ACETAMINOPHEN 325 MG PO TABS
ORAL_TABLET | ORAL | Status: AC
  Filled 2019-10-11: qty 2

## 2019-10-11 MED ORDER — DIPHENHYDRAMINE HCL 25 MG PO CAPS
50.0000 mg | ORAL_CAPSULE | Freq: Once | ORAL | Status: AC
  Administered 2019-10-11: 50 mg via ORAL

## 2019-10-11 MED ORDER — SODIUM CHLORIDE 0.9 % IV SOLN
10.0000 mg | Freq: Once | INTRAVENOUS | Status: AC
  Administered 2019-10-11: 10 mg via INTRAVENOUS
  Filled 2019-10-11: qty 10

## 2019-10-11 MED ORDER — DIPHENHYDRAMINE HCL 25 MG PO CAPS
ORAL_CAPSULE | ORAL | Status: AC
  Filled 2019-10-11: qty 2

## 2019-10-11 MED ORDER — DEXTROSE 5 % IV SOLN
Freq: Once | INTRAVENOUS | Status: AC
  Filled 2019-10-11: qty 250

## 2019-10-11 MED ORDER — FAM-TRASTUZUMAB DERUXTECAN-NXKI CHEMO 100 MG IV SOLR
500.0000 mg | Freq: Once | INTRAVENOUS | Status: AC
  Administered 2019-10-11: 500 mg via INTRAVENOUS
  Filled 2019-10-11: qty 25

## 2019-10-11 MED ORDER — PALONOSETRON HCL INJECTION 0.25 MG/5ML
0.2500 mg | Freq: Once | INTRAVENOUS | Status: AC
  Administered 2019-10-11: 0.25 mg via INTRAVENOUS

## 2019-10-11 MED ORDER — HEPARIN SOD (PORK) LOCK FLUSH 100 UNIT/ML IV SOLN
500.0000 [IU] | Freq: Once | INTRAVENOUS | Status: AC | PRN
  Administered 2019-10-11: 500 [IU]
  Filled 2019-10-11: qty 5

## 2019-10-11 MED ORDER — SODIUM CHLORIDE 0.9% FLUSH
10.0000 mL | INTRAVENOUS | Status: DC | PRN
  Administered 2019-10-11: 10 mL
  Filled 2019-10-11: qty 10

## 2019-10-11 MED ORDER — PALONOSETRON HCL INJECTION 0.25 MG/5ML
INTRAVENOUS | Status: AC
  Filled 2019-10-11: qty 5

## 2019-10-11 MED ORDER — ACETAMINOPHEN 325 MG PO TABS
650.0000 mg | ORAL_TABLET | Freq: Once | ORAL | Status: AC
  Administered 2019-10-11: 650 mg via ORAL

## 2019-10-11 NOTE — Progress Notes (Signed)
Ok to treat with [place] and labs today per MD Marin Olp

## 2019-10-11 NOTE — Progress Notes (Signed)
Today's wt = 70.3 kg Enhertu dose clarified w/ [name].  He would like to give pt 500 mg today.  Kennith Center, Pharm.D., CPP 10/11/2019@10 :47 AM

## 2019-10-11 NOTE — Progress Notes (Signed)
Hematology and Oncology Follow Up Visit  Matthew Reynolds [id number] 03/02/1966 54 y.o. 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  He is doing great.  He and his wife had a wonderful time down to [place].  They went sailing with some friends who had a boat.  He really enjoyed himself.  In a couple weeks, he and the family will be going to the [place] for another vacation.  He is done so well with treatment.  He is eating well.  There is no dysphagia or odynophagia.  He has had no abdominal pain.  He has had no change in bowel or bladder habits.  There has been no bleeding.  The foot drop with the left foot is much better.  He has had no cough or shortness of breath.  There is no wheezing.  He has had no chest wall pain.  Overall, he has a performance status of ECOG 0.   Medications:  Allergies as of 10/11/2019   No Known Allergies     Medication List       Accurate as of October 11, 2019 10:00 AM. If you have any questions, ask your nurse or doctor.        Dexilant 60 MG capsule Generic drug: dexlansoprazole Take 1 capsule (60 mg total) by mouth daily.   lidocaine-prilocaine cream Commonly known as: EMLA Apply 1 application topically as needed.   MULTI-VITAMIN DAILY PO Take by mouth.   prochlorperazine 10 MG tablet Commonly known as: COMPAZINE Take 1 tablet (10 mg total) by mouth  every 6 (six) hours as needed (Nausea or vomiting).   sucralfate 1 GM/10ML suspension Commonly known as: CARAFATE Take 10 mLs (1 g total) by mouth 3 (three) times daily.       Allergies: No Known Allergies  Past Medical History, Surgical history, Social history, and Family History were reviewed and updated.  Review of Systems: Review of Systems  Constitutional: Negative.   HENT: Negative.   Eyes: Negative.   Respiratory: Negative.   Cardiovascular: Negative.   Gastrointestinal: Negative.   Genitourinary: Negative.   Musculoskeletal: Negative.   Skin: Negative.   Neurological: Negative.   Endo/Heme/Allergies: Negative.   Psychiatric/Behavioral: Negative.       Physical Exam:  weight is 155 lb (70.3 kg). His oral temperature is 98.5 F (36.9 C). His blood pressure is 111/69 and his pulse is 64. His respiration is 17 and oxygen saturation is 100%.   Wt Readings from Last 3 Encounters:  10/11/19 155 lb (70.3 kg)  09/21/19 156 lb (70.8 kg)  08/31/19 155 lb 12 oz (70.6 kg)    Physical Exam Vitals reviewed.  HENT:     Head: Normocephalic and atraumatic.  Eyes:     Pupils: Pupils are equal, round, and reactive to light.  Cardiovascular:  Rate and Rhythm: Normal rate and regular rhythm.     Heart sounds: Normal heart sounds.  Pulmonary:     Effort: Pulmonary effort is normal.     Breath sounds: Normal breath sounds.  Abdominal:     General: Bowel sounds are normal.     Palpations: Abdomen is soft.  Musculoskeletal:        General: No tenderness or deformity. Normal range of motion.     Cervical back: Normal range of motion.  Lymphadenopathy:     Cervical: No cervical adenopathy.  Skin:    General: Skin is warm and dry.     Findings: No erythema or rash.  Neurological:     Mental Status: He is alert and oriented to person, place, and time.  Psychiatric:        Behavior: Behavior normal.        Thought Content: Thought content normal.        Judgment:  Judgment normal.      Lab Results  Component Value Date   WBC 2.5 (L) 10/11/2019   HGB 13.2 10/11/2019   HCT 38.7 (L) 10/11/2019   MCV 92.8 10/11/2019   PLT 143 (L) 10/11/2019   Lab Results  Component Value Date   FERRITIN 215 03/25/2019   IRON 92 03/25/2019   TIBC 274 03/25/2019   UIBC 182 03/25/2019   IRONPCTSAT 34 03/25/2019   Lab Results  Component Value Date   RBC 4.17 (L) 10/11/2019   No results found for: KPAFRELGTCHN, LAMBDASER, KAPLAMBRATIO No results found for: IGGSERUM, IGA, IGMSERUM No results found for: Odetta Pink, SPEI   Chemistry      Component Value Date/Time   NA 140 10/11/2019 0910   K 3.7 10/11/2019 0910   CL 106 10/11/2019 0910   CO2 28 10/11/2019 0910   BUN 15 10/11/2019 0910   CREATININE 0.90 10/11/2019 0910      Component Value Date/Time   CALCIUM 9.3 10/11/2019 0910   ALKPHOS 120 10/11/2019 0910   AST 23 10/11/2019 0910   ALT 17 10/11/2019 0910   BILITOT 0.4 10/11/2019 0910       [name] is a very pleasant 54 yo caucasian gentleman with metastatic adenocarcinoma of the GE junction, HER-2 positive, PD-L1 positive.  As I anticipated, his cancer is still being driven by the fact that his tumor is HER-2 positive.  He is responding very nicely to the Enhertu.  Hopefully the response will be maintained.  We will go ahead and plan for his 6 cycle treatment today.  We will go ahead and get him back in 3 more weeks.  I would not change the interval because he is doing well.  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   [name], MD 7/26/202110:00 AM

## 2019-10-11 NOTE — Telephone Encounter (Signed)
Appointments scheduled calendar printed per 7/26 los

## 2019-10-12 ENCOUNTER — Inpatient Hospital Stay: Payer: 59 | Admitting: Nutrition

## 2019-10-12 NOTE — Progress Notes (Signed)
Nutrition follow up completed with patient. Patient is receiving Enhertu - q 3 week dosing -- s/p cycle # 6 - started on 06/29/2019 for metastatic cancer of the [place] with liver mets. Patient has lost 15% of his body weight over 6 months which is significant. His weight has been stable the past several months and was documented as 157 lb today. He weighed 185 pounds in January and reports UBW as 210 pounds. Dietary recall reveals patient is eating 3 meals and 1 snack daily. He tolerates all foods as long as he takes small bites and chews food well.  Reports breakfast is generally a bowl of cereal with whole milk or a high protein breakfast burrito or "bowl". Lunch is leftovers from the night before or a sandwich. Dinner consists of a meat or chicken, a potato or rice, and a vegetable. He snacks on popcorn or drinks CIB. Consumes 48 oz water minimum daily along with iced coffee and milk. He drinks an occasional soft drink.  Nutrition Diagnosis:Unintended weight loss is stable.  Intervention: Educated to continue small frequent meals and snacks with high calorie, high protein foods for weight stability. Incorporate healthy fats such as nuts or avocado as tolerated. Continue to chew food well and eat smaller bites. Questions answered and teach back used.  Monitoring, Evaluation, Goals: Patient will tolerate healthy diet with adequate calories and protein for wt maintenance.  Next Visit: Patient will call me with questions or concerns.

## 2019-11-01 ENCOUNTER — Inpatient Hospital Stay (HOSPITAL_BASED_OUTPATIENT_CLINIC_OR_DEPARTMENT_OTHER): Payer: 59 | Admitting: Family

## 2019-11-01 ENCOUNTER — Other Ambulatory Visit: Payer: Self-pay

## 2019-11-01 ENCOUNTER — Inpatient Hospital Stay: Payer: 59

## 2019-11-01 ENCOUNTER — Inpatient Hospital Stay: Payer: 59 | Attending: Hematology & Oncology

## 2019-11-01 ENCOUNTER — Encounter: Payer: Self-pay | Admitting: Family

## 2019-11-01 VITALS — BP 118/73 | HR 59 | Temp 98.1°F | Resp 18 | Ht 69.0 in | Wt 154.1 lb

## 2019-11-01 DIAGNOSIS — G629 Polyneuropathy, unspecified: Secondary | ICD-10-CM | POA: Insufficient documentation

## 2019-11-01 DIAGNOSIS — C799 Secondary malignant neoplasm of unspecified site: Secondary | ICD-10-CM

## 2019-11-01 DIAGNOSIS — C160 Malignant neoplasm of cardia: Secondary | ICD-10-CM

## 2019-11-01 DIAGNOSIS — Z5112 Encounter for antineoplastic immunotherapy: Secondary | ICD-10-CM | POA: Diagnosis not present

## 2019-11-01 DIAGNOSIS — Z79899 Other long term (current) drug therapy: Secondary | ICD-10-CM | POA: Insufficient documentation

## 2019-11-01 DIAGNOSIS — Z7189 Other specified counseling: Secondary | ICD-10-CM

## 2019-11-01 LAB — CBC WITH DIFFERENTIAL (CANCER CENTER ONLY)
Abs Immature Granulocytes: 0 10*3/uL (ref 0.00–0.07)
Basophils Absolute: 0 10*3/uL (ref 0.0–0.1)
Basophils Relative: 1 %
Eosinophils Absolute: 0.1 10*3/uL (ref 0.0–0.5)
Eosinophils Relative: 3 %
HCT: 38.2 % — ABNORMAL LOW (ref 39.0–52.0)
Hemoglobin: 12.9 g/dL — ABNORMAL LOW (ref 13.0–17.0)
Immature Granulocytes: 0 %
Lymphocytes Relative: 22 %
Lymphs Abs: 0.5 10*3/uL — ABNORMAL LOW (ref 0.7–4.0)
MCH: 32 pg (ref 26.0–34.0)
MCHC: 33.8 g/dL (ref 30.0–36.0)
MCV: 94.8 fL (ref 80.0–100.0)
Monocytes Absolute: 0.4 10*3/uL (ref 0.1–1.0)
Monocytes Relative: 15 %
Neutro Abs: 1.4 10*3/uL — ABNORMAL LOW (ref 1.7–7.7)
Neutrophils Relative %: 59 %
Platelet Count: 136 10*3/uL — ABNORMAL LOW (ref 150–400)
RBC: 4.03 MIL/uL — ABNORMAL LOW (ref 4.22–5.81)
RDW: 15.5 % (ref 11.5–15.5)
WBC Count: 2.4 10*3/uL — ABNORMAL LOW (ref 4.0–10.5)
nRBC: 0 % (ref 0.0–0.2)

## 2019-11-01 LAB — CMP (CANCER CENTER ONLY)
ALT: 15 U/L (ref 0–44)
AST: 22 U/L (ref 15–41)
Albumin: 3.7 g/dL (ref 3.5–5.0)
Alkaline Phosphatase: 117 U/L (ref 38–126)
Anion gap: 6 (ref 5–15)
BUN: 9 mg/dL (ref 6–20)
CO2: 28 mmol/L (ref 22–32)
Calcium: 9.3 mg/dL (ref 8.9–10.3)
Chloride: 106 mmol/L (ref 98–111)
Creatinine: 0.94 mg/dL (ref 0.61–1.24)
GFR, Est AFR Am: >60 mL/min (ref >60)
GFR, Estimated: >60 mL/min (ref >60)
Glucose, Bld: 100 mg/dL — ABNORMAL HIGH (ref 70–99)
Potassium: 4.2 mmol/L (ref 3.5–5.1)
Sodium: 140 mmol/L (ref 135–145)
Total Bilirubin: 0.5 mg/dL (ref 0.3–1.2)
Total Protein: 6.1 g/dL — ABNORMAL LOW (ref 6.5–8.1)

## 2019-11-01 LAB — LACTATE DEHYDROGENASE: LDH: 161 U/L (ref 98–192)

## 2019-11-01 MED ORDER — DIPHENHYDRAMINE HCL 25 MG PO CAPS
ORAL_CAPSULE | ORAL | Status: AC
  Filled 2019-11-01: qty 2

## 2019-11-01 MED ORDER — PALONOSETRON HCL INJECTION 0.25 MG/5ML
INTRAVENOUS | Status: AC
  Filled 2019-11-01: qty 5

## 2019-11-01 MED ORDER — DIPHENHYDRAMINE HCL 25 MG PO CAPS
50.0000 mg | ORAL_CAPSULE | Freq: Once | ORAL | Status: AC
  Administered 2019-11-01: 50 mg via ORAL

## 2019-11-01 MED ORDER — SODIUM CHLORIDE 0.9% FLUSH
10.0000 mL | INTRAVENOUS | Status: DC | PRN
  Administered 2019-11-01: 10 mL
  Filled 2019-11-01: qty 10

## 2019-11-01 MED ORDER — FAM-TRASTUZUMAB DERUXTECAN-NXKI CHEMO 100 MG IV SOLR
500.0000 mg | Freq: Once | INTRAVENOUS | Status: AC
  Administered 2019-11-01: 500 mg via INTRAVENOUS
  Filled 2019-11-01: qty 25

## 2019-11-01 MED ORDER — ACETAMINOPHEN 325 MG PO TABS
650.0000 mg | ORAL_TABLET | Freq: Once | ORAL | Status: AC
  Administered 2019-11-01: 650 mg via ORAL

## 2019-11-01 MED ORDER — DEXTROSE 5 % IV SOLN
Freq: Once | INTRAVENOUS | Status: AC
  Filled 2019-11-01: qty 250

## 2019-11-01 MED ORDER — PALONOSETRON HCL INJECTION 0.25 MG/5ML
0.2500 mg | Freq: Once | INTRAVENOUS | Status: AC
  Administered 2019-11-01: 0.25 mg via INTRAVENOUS

## 2019-11-01 MED ORDER — ACETAMINOPHEN 325 MG PO TABS
ORAL_TABLET | ORAL | Status: AC
  Filled 2019-11-01: qty 2

## 2019-11-01 MED ORDER — HEPARIN SOD (PORK) LOCK FLUSH 100 UNIT/ML IV SOLN
500.0000 [IU] | Freq: Once | INTRAVENOUS | Status: AC | PRN
  Administered 2019-11-01: 500 [IU]
  Filled 2019-11-01: qty 5

## 2019-11-01 MED ORDER — SODIUM CHLORIDE 0.9 % IV SOLN
10.0000 mg | Freq: Once | INTRAVENOUS | Status: AC
  Administered 2019-11-01: 10 mg via INTRAVENOUS
  Filled 2019-11-01: qty 10

## 2019-11-01 MED FILL — DEXILANT DR 60 MG CAPSULE: 60 | 30 days supply | Qty: 30 | Fill #1

## 2019-11-01 NOTE — Progress Notes (Signed)
Hematology and Oncology Follow Up Visit  Matthew Reynolds [id number] 1966-01-09 54 y.o. 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 He is doing well and had a wonderful time recently with family and friends at the beach.  He has tolerated treatment nicely so far and has had a good response.  WBC count is 2.4, ANC 1.4, Hgb 12.9 and platelets 136.  No fever, chills, n/v, cough, rash, dizziness, SOB, chest pain, palpitations, abdominal pain or changes in bowel or bladder habits.  No episodes of bleeding. No bruising or petechiae.  No swelling in her extremities.  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  He has started walking for exercise and is hoping to get back to the [facility] soon so he can swim.  His foot drop seems to be better. He is exercising his feet as well.  He has a good appetite and is also supplementing with high protein carnation instant breakfast. He is doing his best to stay well hydrated. His weight is stable at 154 lbs.   ECOG Performance Status: 1 - Symptomatic but completely ambulatory  Medications:  Allergies as of 11/01/2019   No Known Allergies     Medication List       Accurate as of November 01, 2019 11:44 AM. If you have any questions, ask your nurse or doctor.        Dexilant 60 MG capsule Generic drug: dexlansoprazole Take 1 capsule (60 mg total)  by mouth daily.   lidocaine-prilocaine cream Commonly known as: EMLA Apply 1 application topically as needed.   MULTI-VITAMIN DAILY PO Take by mouth.   prochlorperazine 10 MG tablet Commonly known as: COMPAZINE Take 1 tablet (10 mg total) by mouth every 6 (six) hours as needed (Nausea or vomiting).   sucralfate 1 GM/10ML suspension Commonly known as: CARAFATE Take 10 mLs (1 g total) by mouth 3 (three) times daily.       Allergies: No Known Allergies  Past Medical History, Surgical history, Social history, and Family History were reviewed and updated.  Review of Systems: All other 10 point review of systems is negative.   Physical Exam:  height is _0  (1.753 m) and weight is 154 lb 1.3 oz (69.9 kg). His oral temperature is 98.1 F (36.7 C). His blood pressure is 118/73 and his pulse is 59 (abnormal). His respiration is 18 and oxygen saturation is 100%.   Wt Readings from Last 3 Encounters:  11/01/19 154 lb 1.3 oz (69.9 kg)  11/01/19 154 lb 1.3 oz (69.9 kg)  10/12/19 157 lb (71.2 kg)    Ocular: Sclerae unicteric, pupils equal, round and reactive to light Ear-nose-throat: Oropharynx clear, dentition fair Lymphatic: No cervical or supraclavicular adenopathy Lungs no rales or rhonchi, good excursion bilaterally Heart regular rate and rhythm, no murmur appreciated Abd soft, nontender, positive bowel sounds, no liver or spleen  tip palpated on exam, no fluid wave  MSK no focal spinal tenderness, no joint edema Neuro: non-focal, well-oriented, appropriate affect Breasts: Deferred   Lab Results  Component Value Date   WBC 2.4 (L) 11/01/2019   HGB 12.9 (L) 11/01/2019   HCT 38.2 (L) 11/01/2019   MCV 94.8 11/01/2019   PLT 136 (L) 11/01/2019   Lab Results  Component Value Date   FERRITIN 215 03/25/2019   IRON 92 03/25/2019   TIBC 274 03/25/2019   UIBC 182 03/25/2019   IRONPCTSAT 34 03/25/2019   Lab Results  Component Value Date   RBC 4.03 (L) 11/01/2019   No  results found for: KPAFRELGTCHN, LAMBDASER, KAPLAMBRATIO No results found for: IGGSERUM, IGA, IGMSERUM No results found for: Ronnald Ramp, A1GS, Gilford Silvius, MSPIKE, SPEI   Chemistry      Component Value Date/Time   NA 140 11/01/2019 1102   K 4.2 11/01/2019 1102   CL 106 11/01/2019 1102   CO2 28 11/01/2019 1102   BUN 9 11/01/2019 1102   CREATININE 0.94 11/01/2019 1102      Component Value Date/Time   CALCIUM 9.3 11/01/2019 1102   ALKPHOS 117 11/01/2019 1102   AST 22 11/01/2019 1102   ALT 15 11/01/2019 1102   BILITOT 0.5 11/01/2019 1102       [name] is a very pleasant 54 yo caucasian gentleman with metastatic adenocarcinoma of the GE junction, HER-2 positive, PD-L1 positive. We will proceed with cycle 7 today as planned. Ok to treat with ANC 1.4 per [name].  Iron studies are pending. We will replace if needed.  We will see him again in another 3 weeks! He will contact our office with any questions or concerns. We can certainly see him sooner if needed.   Laverna Peace, NP 8/16/202111:44 AM

## 2019-11-01 NOTE — Patient Instructions (Signed)
Fam-Trastuzumab deruxtecan injection What is this medicine? TRASTUZUMAB DERUXTECAN (tras TOOZ eu mab DER ux TEE kan) is a monoclonal antibody combined with chemotherapy. It is used to treat breast cancer. This medicine may be used for other purposes; ask your health care provider or pharmacist if you have questions. COMMON BRAND NAME(S): ENHERTU What should I tell my health care provider before I take this medicine? They need to know if you have any of these conditions:  heart disease  heart failure  infection (especially a virus infection such as chickenpox, cold sores, or herpes)  liver disease  lung or breathing disease, like asthma  an unusual or allergic reaction to fam-trastuzumab deruxtecan, other medications, foods, dyes, or preservatives  pregnant or trying to get pregnant  breast-feeding How should I use this medicine? This medicine is for infusion into a vein. It is given by a health care professional in a hospital or clinic setting. Talk to your pediatrician regarding the use of this medicine in children. Special care may be needed. Overdosage: If you think you have taken too much of this medicine contact a poison control center or emergency room at once. NOTE: This medicine is only for you. Do not share this medicine with others. What if I miss a dose? It is important not to miss your dose. Call your doctor or health care professional if you are unable to keep an appointment. What may interact with this medicine? Interaction studies have not been performed. This list may not describe all possible interactions. Give your health care provider a list of all the medicines, herbs, non-prescription drugs, or dietary supplements you use. Also tell them if you smoke, drink alcohol, or use illegal drugs. Some items may interact with your medicine. What should I watch for while using this medicine? Visit your healthcare professional for regular checks on your progress. Tell your  healthcare professional if your symptoms do not start to get better or if they get worse. Your condition will be monitored carefully while you are receiving this medicine. Do not become pregnant while taking this medicine or for 7 months after stopping it. Women should inform their healthcare professional if they wish to become pregnant or think they might be pregnant. Men should not father a child while taking this medicine and for 4 months after stopping it. There is potential for serious side effects to an unborn child. Talk to your healthcare professional for more information. Do not breast-feed an infant while taking this medicine or for 7 months after the last dose. This medicine has caused decreased sperm counts in some men. This may make it more difficult to father a child. Talk to your healthcare professional if you are concerned about your fertility. This medicine may increase your risk to bruise or bleed. Call your health care professional if you notice any unusual bleeding. Be careful brushing or flossing your teeth or using a toothpick because you may get an infection or bleed more easily. If you have any dental work done, tell your dentist you are receiving this medicine. This medicine may cause dry eyes [and blurred vision]. If you wear contact lenses, you may feel some discomfort. Lubricating eye drops may help. See your healthcare professional if the problem does not go away or is severe. Call your healthcare professional for advice if you get a fever, chills, or sore throat, or other symptoms of a cold or flu. Do not treat yourself. This medicine decreases your body's ability to fight infections. Try to  avoid being around people who are sick. Avoid taking medicines that contain aspirin, acetaminophen, ibuprofen, naproxen, or ketoprofen unless instructed by your healthcare professional. These medicines may hide a fever. What side effects may I notice from receiving this medicine? Side  effects that you should report to your doctor or health care professional as soon as possible:  allergic reactions like skin rash, itching or hives, swelling of the face, lips, or tongue  breathing problems  cough  nausea, vomiting  signs and symptoms of bleeding such as bloody or black, tarry stools; red or dark-brown urine; spitting up blood or brown material that looks like coffee grounds; red spots on the skin; unusual bruising or bleeding from the eye, gums, or nose  signs and symptoms of heart failure like breathing problems, fast, irregular heartbeat, sudden weight gain; swelling of the ankles, feet, hands; unusually weak or tired  signs and symptoms of infection like fever; chills; cough; sore throat; pain or trouble passing urine  signs and symptoms of low red blood cells or anemia such as unusually weak or tired; feeling faint or lightheaded; falls; breathing problems Side effects that usually do not require medical attention (report these to your doctor or health care professional if they continue or are bothersome):  constipation  diarrhea  dry eyes  hair loss  loss of appetite  mouth sores  rash This list may not describe all possible side effects. Call your doctor for medical advice about side effects. You may report side effects to FDA at 1-800-FDA-1088. Where should I keep my medicine? This drug is given in a hospital or clinic and will not be stored at home. NOTE: This sheet is a summary. It may not cover all possible information. If you have questions about this medicine, talk to your doctor, pharmacist, or health care provider.  2020 Elsevier/Gold Standard (2018-05-12 15:07:27)

## 2019-11-01 NOTE — Progress Notes (Signed)
Reviewed pt labs with Laverna Peace, NP and pt ok to treat with ANC 1.4

## 2019-11-02 ENCOUNTER — Telehealth: Payer: Self-pay | Admitting: Family

## 2019-11-02 LAB — IRON AND TIBC
Iron: 59 ug/dL (ref 42–163)
Saturation Ratios: 21 % (ref 20–55)
TIBC: 281 ug/dL (ref 202–409)
UIBC: 222 ug/dL (ref 117–376)

## 2019-11-02 LAB — FERRITIN: Ferritin: 235 ng/mL (ref 24–336)

## 2019-11-02 NOTE — Telephone Encounter (Signed)
Per 8/16 los Has follow-up and treatment scheduled through September!

## 2019-11-23 ENCOUNTER — Inpatient Hospital Stay: Payer: 59 | Attending: Hematology & Oncology

## 2019-11-23 ENCOUNTER — Other Ambulatory Visit: Payer: Self-pay

## 2019-11-23 ENCOUNTER — Inpatient Hospital Stay: Payer: 59

## 2019-11-23 ENCOUNTER — Inpatient Hospital Stay (HOSPITAL_BASED_OUTPATIENT_CLINIC_OR_DEPARTMENT_OTHER): Payer: 59 | Admitting: Family

## 2019-11-23 ENCOUNTER — Encounter: Payer: Self-pay | Admitting: Family

## 2019-11-23 ENCOUNTER — Telehealth: Payer: Self-pay | Admitting: Family

## 2019-11-23 VITALS — BP 111/73 | HR 73 | Temp 97.7°F | Resp 17 | Ht 69.0 in | Wt 156.4 lb

## 2019-11-23 DIAGNOSIS — Z79899 Other long term (current) drug therapy: Secondary | ICD-10-CM | POA: Diagnosis not present

## 2019-11-23 DIAGNOSIS — C160 Malignant neoplasm of cardia: Secondary | ICD-10-CM | POA: Diagnosis not present

## 2019-11-23 DIAGNOSIS — C799 Secondary malignant neoplasm of unspecified site: Secondary | ICD-10-CM | POA: Diagnosis not present

## 2019-11-23 DIAGNOSIS — Z923 Personal history of irradiation: Secondary | ICD-10-CM | POA: Diagnosis not present

## 2019-11-23 DIAGNOSIS — Z5112 Encounter for antineoplastic immunotherapy: Secondary | ICD-10-CM | POA: Insufficient documentation

## 2019-11-23 DIAGNOSIS — Z9221 Personal history of antineoplastic chemotherapy: Secondary | ICD-10-CM | POA: Diagnosis not present

## 2019-11-23 DIAGNOSIS — Z7189 Other specified counseling: Secondary | ICD-10-CM

## 2019-11-23 LAB — CBC WITH DIFFERENTIAL (CANCER CENTER ONLY)
Abs Immature Granulocytes: 0.02 10*3/uL (ref 0.00–0.07)
Basophils Absolute: 0 10*3/uL (ref 0.0–0.1)
Basophils Relative: 1 %
Eosinophils Absolute: 0.1 10*3/uL (ref 0.0–0.5)
Eosinophils Relative: 4 %
HCT: 38.4 % — ABNORMAL LOW (ref 39.0–52.0)
Hemoglobin: 13 g/dL (ref 13.0–17.0)
Immature Granulocytes: 1 %
Lymphocytes Relative: 29 %
Lymphs Abs: 0.7 10*3/uL (ref 0.7–4.0)
MCH: 32.3 pg (ref 26.0–34.0)
MCHC: 33.9 g/dL (ref 30.0–36.0)
MCV: 95.3 fL (ref 80.0–100.0)
Monocytes Absolute: 0.3 10*3/uL (ref 0.1–1.0)
Monocytes Relative: 14 %
Neutro Abs: 1.2 10*3/uL — ABNORMAL LOW (ref 1.7–7.7)
Neutrophils Relative %: 51 %
Platelet Count: 147 10*3/uL — ABNORMAL LOW (ref 150–400)
RBC: 4.03 MIL/uL — ABNORMAL LOW (ref 4.22–5.81)
RDW: 14.8 % (ref 11.5–15.5)
WBC Count: 2.3 10*3/uL — ABNORMAL LOW (ref 4.0–10.5)
nRBC: 0 % (ref 0.0–0.2)

## 2019-11-23 LAB — CMP (CANCER CENTER ONLY)
ALT: 15 U/L (ref 0–44)
AST: 22 U/L (ref 15–41)
Albumin: 3.6 g/dL (ref 3.5–5.0)
Alkaline Phosphatase: 130 U/L — ABNORMAL HIGH (ref 38–126)
Anion gap: 6 (ref 5–15)
BUN: 13 mg/dL (ref 6–20)
CO2: 28 mmol/L (ref 22–32)
Calcium: 9.1 mg/dL (ref 8.9–10.3)
Chloride: 106 mmol/L (ref 98–111)
Creatinine: 0.94 mg/dL (ref 0.61–1.24)
GFR, Est AFR Am: >60 mL/min (ref >60)
GFR, Estimated: >60 mL/min (ref >60)
Glucose, Bld: 124 mg/dL — ABNORMAL HIGH (ref 70–99)
Potassium: 4.1 mmol/L (ref 3.5–5.1)
Sodium: 140 mmol/L (ref 135–145)
Total Bilirubin: 0.5 mg/dL (ref 0.3–1.2)
Total Protein: 6.3 g/dL — ABNORMAL LOW (ref 6.5–8.1)

## 2019-11-23 LAB — LACTATE DEHYDROGENASE: LDH: 145 U/L (ref 98–192)

## 2019-11-23 MED ORDER — FAM-TRASTUZUMAB DERUXTECAN-NXKI CHEMO 100 MG IV SOLR
500.0000 mg | Freq: Once | INTRAVENOUS | Status: AC
  Administered 2019-11-23: 500 mg via INTRAVENOUS
  Filled 2019-11-23: qty 25

## 2019-11-23 MED ORDER — DIPHENHYDRAMINE HCL 25 MG PO CAPS
ORAL_CAPSULE | ORAL | Status: AC
  Filled 2019-11-23: qty 2

## 2019-11-23 MED ORDER — SODIUM CHLORIDE 0.9 % IV SOLN
10.0000 mg | Freq: Once | INTRAVENOUS | Status: AC
  Administered 2019-11-23: 10 mg via INTRAVENOUS
  Filled 2019-11-23: qty 10

## 2019-11-23 MED ORDER — DEXTROSE 5 % IV SOLN
Freq: Once | INTRAVENOUS | Status: AC
  Filled 2019-11-23: qty 250

## 2019-11-23 MED ORDER — ACETAMINOPHEN 325 MG PO TABS
650.0000 mg | ORAL_TABLET | Freq: Once | ORAL | Status: AC
  Administered 2019-11-23: 650 mg via ORAL

## 2019-11-23 MED ORDER — DIPHENHYDRAMINE HCL 25 MG PO CAPS
50.0000 mg | ORAL_CAPSULE | Freq: Once | ORAL | Status: AC
  Administered 2019-11-23: 50 mg via ORAL

## 2019-11-23 MED ORDER — SODIUM CHLORIDE 0.9% FLUSH
10.0000 mL | INTRAVENOUS | Status: DC | PRN
  Administered 2019-11-23: 10 mL
  Filled 2019-11-23: qty 10

## 2019-11-23 MED ORDER — ACETAMINOPHEN 325 MG PO TABS
ORAL_TABLET | ORAL | Status: AC
  Filled 2019-11-23: qty 2

## 2019-11-23 MED ORDER — PALONOSETRON HCL INJECTION 0.25 MG/5ML
0.2500 mg | Freq: Once | INTRAVENOUS | Status: AC
  Administered 2019-11-23: 0.25 mg via INTRAVENOUS

## 2019-11-23 MED ORDER — PALONOSETRON HCL INJECTION 0.25 MG/5ML
INTRAVENOUS | Status: AC
  Filled 2019-11-23: qty 5

## 2019-11-23 MED ORDER — HEPARIN SOD (PORK) LOCK FLUSH 100 UNIT/ML IV SOLN
500.0000 [IU] | Freq: Once | INTRAVENOUS | Status: AC | PRN
  Administered 2019-11-23: 500 [IU]
  Filled 2019-11-23: qty 5

## 2019-11-23 NOTE — Telephone Encounter (Signed)
Appointments scheduled calendar printed per 9/7 los

## 2019-11-23 NOTE — Progress Notes (Signed)
Ok to treat with [place] and labs today per Judson Roch NP

## 2019-11-23 NOTE — Patient Instructions (Signed)

## 2019-11-23 NOTE — Progress Notes (Signed)
Hematology and Oncology Follow Up Visit  Matthew Reynolds [id number] 08-06-1965 54 y.o. 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 He looks great and states that he is feeling good.  He denies fatigue and is resting well at night.  No fever, chills, n/v, cough, rash, dizziness, SOB, chest pain, palpitations, abdominal pain or changes in bowel or bladder habits.  He will have constipation for a couple days about a week after each cycle of treatment.  He notes a little blood on his toilet tissue if he strains. No other blood loss noted. No bruising or petechiae.  No swelling or tenderness in his extremities.  He has minimal numbness and tingling in his fingertips and toes since starting his initial treatment.  No falls or syncope.  He has a good appetite and is staying well hydrated. His weight is up 2 lbs to 156 lbs.   ECOG Performance Status: 1 - Symptomatic but completely ambulatory  Medications:  Allergies as of 11/23/2019   No Known Allergies     Medication List       Accurate as of November 23, 2019  9:14 AM. If you have any questions, ask your nurse or doctor.        Dexilant 60 MG capsule Generic drug: dexlansoprazole Take 1 capsule (60 mg total) by mouth daily.   lidocaine-prilocaine cream Commonly known as: EMLA Apply 1 application topically as needed.   MULTI-VITAMIN DAILY PO Take by  mouth.   prochlorperazine 10 MG tablet Commonly known as: COMPAZINE Take 1 tablet (10 mg total) by mouth every 6 (six) hours as needed (Nausea or vomiting).   sucralfate 1 GM/10ML suspension Commonly known as: CARAFATE Take 10 mLs (1 g total) by mouth 3 (three) times daily.       Allergies: No Known Allergies  Past Medical History, Surgical history, Social history, and Family History were reviewed and updated.  Review of Systems: All other 10 point review of systems is negative.   Physical Exam:  height is _0  (1.753 m) and weight is 156 lb 6.4 oz (70.9 kg). His oral temperature is 97.7 F (36.5 C). His blood pressure is 111/73 and his pulse is 73. His respiration is 17 and oxygen saturation is 99%.   Wt Readings from Last 3 Encounters:  11/23/19 156 lb 6.4 oz (70.9 kg)  11/01/19 154 lb 1.3 oz (69.9 kg)  11/01/19 154 lb 1.3 oz (69.9 kg)    Ocular: Sclerae unicteric, pupils equal, round and reactive to light Ear-nose-throat: Oropharynx clear, dentition fair Lymphatic: No cervical or supraclavicular adenopathy Lungs no rales or rhonchi, good excursion bilaterally Heart regular rate and rhythm, no murmur appreciated Abd soft, nontender, positive bowel sounds, no liver or spleen tip palpated on exam, no fluid wave  MSK no focal spinal tenderness, no joint edema Neuro: non-focal, well-oriented, appropriate affect Breasts: Deferred   Lab Results  Component Value Date   WBC 2.4 (L) 11/01/2019   HGB 12.9 (L) 11/01/2019   HCT 38.2 (L) 11/01/2019   MCV 94.8 11/01/2019   PLT 136 (L) 11/01/2019   Lab Results  Component Value Date   FERRITIN 235 11/01/2019   IRON 59 11/01/2019   TIBC 281 11/01/2019   UIBC 222 11/01/2019   IRONPCTSAT 21 11/01/2019   Lab Results  Component Value Date   RBC 4.03 (L) 11/01/2019   No results found for: KPAFRELGTCHN, LAMBDASER, KAPLAMBRATIO No results found for: IGGSERUM, IGA, IGMSERUM No results found for: Ronnald Ramp, A1GS,  A2GS, Violet Baldy, MSPIKE, SPEI   Chemistry      Component Value Date/Time   NA 140 11/01/2019 1102   K 4.2 11/01/2019 1102   CL 106 11/01/2019 1102   CO2 28 11/01/2019 1102   BUN 9 11/01/2019 1102   CREATININE 0.94 11/01/2019 1102      Component Value Date/Time   CALCIUM 9.3 11/01/2019 1102   ALKPHOS 117 11/01/2019 1102   AST 22 11/01/2019 1102   ALT 15 11/01/2019 1102   BILITOT 0.5 11/01/2019 1102       [name] is a very pleasant 54 yo caucasian gentleman with metastatic adenocarcinoma of the GE junction, HER-2 positive, PD-L1 positive. We will proceed with cycle 8 today as planned per [name].  We will plan to repeat another PET scan in October.  We will see him again in another 3 weeks.  He will contact our office with any questions or concerns.   Laverna Peace, NP 9/7/20219:14 AM

## 2019-12-13 ENCOUNTER — Inpatient Hospital Stay: Payer: 59

## 2019-12-13 ENCOUNTER — Inpatient Hospital Stay (HOSPITAL_BASED_OUTPATIENT_CLINIC_OR_DEPARTMENT_OTHER): Payer: 59 | Admitting: Hematology & Oncology

## 2019-12-13 ENCOUNTER — Encounter: Payer: Self-pay | Admitting: Hematology & Oncology

## 2019-12-13 ENCOUNTER — Other Ambulatory Visit: Payer: Self-pay

## 2019-12-13 VITALS — BP 134/76 | HR 89 | Temp 98.3°F | Resp 18 | Wt 153.8 lb

## 2019-12-13 DIAGNOSIS — Z7189 Other specified counseling: Secondary | ICD-10-CM

## 2019-12-13 DIAGNOSIS — C799 Secondary malignant neoplasm of unspecified site: Secondary | ICD-10-CM

## 2019-12-13 DIAGNOSIS — C160 Malignant neoplasm of cardia: Secondary | ICD-10-CM | POA: Diagnosis not present

## 2019-12-13 LAB — CMP (CANCER CENTER ONLY)
ALT: 18 U/L (ref 0–44)
AST: 28 U/L (ref 15–41)
Albumin: 3.6 g/dL (ref 3.5–5.0)
Alkaline Phosphatase: 123 U/L (ref 38–126)
Anion gap: 7 (ref 5–15)
BUN: 11 mg/dL (ref 6–20)
CO2: 28 mmol/L (ref 22–32)
Calcium: 9.2 mg/dL (ref 8.9–10.3)
Chloride: 104 mmol/L (ref 98–111)
Creatinine: 0.87 mg/dL (ref 0.61–1.24)
GFR, Est AFR Am: >60 mL/min (ref >60)
GFR, Estimated: >60 mL/min (ref >60)
Glucose, Bld: 144 mg/dL — ABNORMAL HIGH (ref 70–99)
Potassium: 3.9 mmol/L (ref 3.5–5.1)
Sodium: 139 mmol/L (ref 135–145)
Total Bilirubin: 0.6 mg/dL (ref 0.3–1.2)
Total Protein: 6.3 g/dL — ABNORMAL LOW (ref 6.5–8.1)

## 2019-12-13 LAB — CBC WITH DIFFERENTIAL (CANCER CENTER ONLY)
Abs Immature Granulocytes: 0.01 10*3/uL (ref 0.00–0.07)
Basophils Absolute: 0 10*3/uL (ref 0.0–0.1)
Basophils Relative: 1 %
Eosinophils Absolute: 0.1 10*3/uL (ref 0.0–0.5)
Eosinophils Relative: 3 %
HCT: 39.1 % (ref 39.0–52.0)
Hemoglobin: 13.3 g/dL (ref 13.0–17.0)
Immature Granulocytes: 0 %
Lymphocytes Relative: 29 %
Lymphs Abs: 0.7 10*3/uL (ref 0.7–4.0)
MCH: 32.3 pg (ref 26.0–34.0)
MCHC: 34 g/dL (ref 30.0–36.0)
MCV: 94.9 fL (ref 80.0–100.0)
Monocytes Absolute: 0.4 10*3/uL (ref 0.1–1.0)
Monocytes Relative: 16 %
Neutro Abs: 1.2 10*3/uL — ABNORMAL LOW (ref 1.7–7.7)
Neutrophils Relative %: 51 %
Platelet Count: 134 10*3/uL — ABNORMAL LOW (ref 150–400)
RBC: 4.12 MIL/uL — ABNORMAL LOW (ref 4.22–5.81)
RDW: 14.7 % (ref 11.5–15.5)
WBC Count: 2.3 10*3/uL — ABNORMAL LOW (ref 4.0–10.5)
nRBC: 0 % (ref 0.0–0.2)

## 2019-12-13 LAB — IRON AND TIBC
Iron: 55 ug/dL (ref 42–163)
Saturation Ratios: 19 % — ABNORMAL LOW (ref 20–55)
TIBC: 289 ug/dL (ref 202–409)
UIBC: 234 ug/dL (ref 117–376)

## 2019-12-13 LAB — LACTATE DEHYDROGENASE: LDH: 148 U/L (ref 98–192)

## 2019-12-13 LAB — FERRITIN: Ferritin: 222 ng/mL (ref 24–336)

## 2019-12-13 MED ORDER — HEPARIN SOD (PORK) LOCK FLUSH 100 UNIT/ML IV SOLN
500.0000 [IU] | Freq: Once | INTRAVENOUS | Status: AC | PRN
  Administered 2019-12-13: 500 [IU]
  Filled 2019-12-13: qty 5

## 2019-12-13 MED ORDER — FAM-TRASTUZUMAB DERUXTECAN-NXKI CHEMO 100 MG IV SOLR
500.0000 mg | Freq: Once | INTRAVENOUS | Status: AC
  Administered 2019-12-13: 500 mg via INTRAVENOUS
  Filled 2019-12-13: qty 25

## 2019-12-13 MED ORDER — ACETAMINOPHEN 325 MG PO TABS
ORAL_TABLET | ORAL | Status: AC
  Filled 2019-12-13: qty 2

## 2019-12-13 MED ORDER — DIPHENHYDRAMINE HCL 25 MG PO CAPS
ORAL_CAPSULE | ORAL | Status: AC
  Filled 2019-12-13: qty 2

## 2019-12-13 MED ORDER — SODIUM CHLORIDE 0.9 % IV SOLN
10.0000 mg | Freq: Once | INTRAVENOUS | Status: AC
  Administered 2019-12-13: 10 mg via INTRAVENOUS
  Filled 2019-12-13: qty 10

## 2019-12-13 MED ORDER — ACETAMINOPHEN 325 MG PO TABS
650.0000 mg | ORAL_TABLET | Freq: Once | ORAL | Status: AC
  Administered 2019-12-13: 650 mg via ORAL

## 2019-12-13 MED ORDER — PALONOSETRON HCL INJECTION 0.25 MG/5ML
0.2500 mg | Freq: Once | INTRAVENOUS | Status: AC
  Administered 2019-12-13: 0.25 mg via INTRAVENOUS

## 2019-12-13 MED ORDER — PALONOSETRON HCL INJECTION 0.25 MG/5ML
INTRAVENOUS | Status: AC
  Filled 2019-12-13: qty 5

## 2019-12-13 MED ORDER — SODIUM CHLORIDE 0.9% FLUSH
10.0000 mL | INTRAVENOUS | Status: DC | PRN
  Administered 2019-12-13: 10 mL
  Filled 2019-12-13: qty 10

## 2019-12-13 MED ORDER — DEXTROSE 5 % IV SOLN
Freq: Once | INTRAVENOUS | Status: AC
  Filled 2019-12-13: qty 250

## 2019-12-13 MED ORDER — DIPHENHYDRAMINE HCL 25 MG PO CAPS
50.0000 mg | ORAL_CAPSULE | Freq: Once | ORAL | Status: AC
  Administered 2019-12-13: 50 mg via ORAL

## 2019-12-13 MED FILL — FLUARIX QUADRIVALENT 0.5 ML: 0.5 | 1 days supply | Qty: 1 | Fill #0

## 2019-12-13 NOTE — Progress Notes (Signed)
Hematology and Oncology Follow Up Visit  LELEND HEINECKE [id number] March 21, 1965 54 y.o. 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 Kimmey is here today for follow-up.  He is doing quite well.  He had a good weekend.  He was up in the mountains.  [place] had a football game on Thursday.  He and his wife went up for the game.  They state the weekend had a wonderful time.  He has had no problems with cough or shortness of breath.  He has had no pain.  He has had no problems with digestion.  There is been no problems with bowels or bladder.  He has had no melena or bright red blood per rectum.   There has been no rashes.  He has had no leg swelling.  He has had no headache.  Overall, his performance status is ECOG 1.   Medications:  Allergies as of 12/13/2019   No Known Allergies     Medication List       Accurate as of December 13, 2019  9:15 AM. If you have any questions, ask your nurse or doctor.        Dexilant 60 MG capsule Generic drug: dexlansoprazole Take 1 capsule (60 mg total) by mouth daily.   lidocaine-prilocaine cream Commonly known as: EMLA Apply 1 application topically as needed.   MULTI-VITAMIN DAILY PO Take by mouth.   prochlorperazine 10 MG tablet Commonly known as: COMPAZINE Take 1 tablet (10 mg total) by mouth every 6 (six) hours as needed (Nausea or vomiting).   sucralfate 1 GM/10ML  suspension Commonly known as: CARAFATE Take 10 mLs (1 g total) by mouth 3 (three) times daily.       Allergies: No Known Allergies  Past Medical History, Surgical history, Social history, and Family History were reviewed and updated.  Review of Systems: Review of Systems  Constitutional: Negative.   HENT: Negative.   Eyes: Negative.   Respiratory: Negative.   Cardiovascular: Negative.   Gastrointestinal: Negative.   Genitourinary: Negative.   Musculoskeletal: Negative.   Skin: Negative.   Neurological: Negative.   Endo/Heme/Allergies: Negative.   Psychiatric/Behavioral: Negative.      Physical Exam:  weight is 153 lb 12 oz (69.7 kg). His oral temperature is 98.3 F (36.8 C). His blood pressure is 134/76 and his pulse is 89. His respiration is 18 and oxygen saturation is 99%.   Wt Readings from Last 3 Encounters:  12/13/19 153 lb 12 oz (69.7 kg)  12/13/19 153 lb 1.9 oz (69.5 kg)  11/23/19 156 lb 6.4 oz (70.9 kg)    Physical Exam Vitals reviewed.  HENT:     Head: Normocephalic and atraumatic.  Eyes:     Pupils: Pupils are equal, round, and reactive to light.  Cardiovascular:     Rate and Rhythm: Normal rate and regular rhythm.     Heart sounds: Normal heart sounds.  Pulmonary:  Effort: Pulmonary effort is normal.     Breath sounds: Normal breath sounds.  Abdominal:     General: Bowel sounds are normal.     Palpations: Abdomen is soft.  Musculoskeletal:        General: No tenderness or deformity. Normal range of motion.     Cervical back: Normal range of motion.  Lymphadenopathy:     Cervical: No cervical adenopathy.  Skin:    General: Skin is warm and dry.     Findings: No erythema or rash.  Neurological:     Mental Status: He is alert and oriented to person, place, and time.  Psychiatric:        Behavior: Behavior normal.        Thought Content: Thought content normal.        Judgment: Judgment normal.      Lab Results  Component Value Date    WBC 2.3 (L) 11/23/2019   HGB 13.0 11/23/2019   HCT 38.4 (L) 11/23/2019   MCV 95.3 11/23/2019   PLT 147 (L) 11/23/2019   Lab Results  Component Value Date   FERRITIN 235 11/01/2019   IRON 59 11/01/2019   TIBC 281 11/01/2019   UIBC 222 11/01/2019   IRONPCTSAT 21 11/01/2019   Lab Results  Component Value Date   RBC 4.03 (L) 11/23/2019   No results found for: KPAFRELGTCHN, LAMBDASER, KAPLAMBRATIO No results found for: IGGSERUM, IGA, IGMSERUM No results found for: Odetta Pink, SPEI   Chemistry      Component Value Date/Time   NA 140 11/23/2019 0908   K 4.1 11/23/2019 0908   CL 106 11/23/2019 0908   CO2 28 11/23/2019 0908   BUN 13 11/23/2019 0908   CREATININE 0.94 11/23/2019 0908      Component Value Date/Time   CALCIUM 9.1 11/23/2019 0908   ALKPHOS 130 (H) 11/23/2019 0908   AST 22 11/23/2019 0908   ALT 15 11/23/2019 0908   BILITOT 0.5 11/23/2019 0908       Impression and Plan: Mr. Honeycutt is a very pleasant 54 yo caucasian gentleman with metastatic adenocarcinoma of the GE junction, HER-2 positive, PD-L1 positive.  I will have to think that he is doing well with the Enhertu.  I do not see any evidence of progressive disease on physical exam.  We will plan for a follow-up PET scan before we see him back.  He will also need an echocardiogram just for monitoring his cardiac function.  I am happy that his quality of life is doing so well.  We will plan to get him back in 3 weeks for his 10th cycle of treatment.  [name], MD 9/27/20219:15 AM

## 2019-12-13 NOTE — Progress Notes (Signed)
Reviewed pt labs with [name] and pt ok to treat with ANC 1.2

## 2019-12-13 NOTE — Patient Instructions (Signed)
[facility] Discharge Instructions for Patients Receiving Chemotherapy  Today you received the following chemotherapy agents Enhertu  To help prevent nausea and vomiting after your treatment, we encourage you to take your nausea medication as prescribed by MD.   If you develop nausea and vomiting that is not controlled by your nausea medication, call the clinic.   BELOW ARE SYMPTOMS THAT SHOULD BE REPORTED IMMEDIATELY:  *FEVER GREATER THAN 100.5 F  *CHILLS WITH OR WITHOUT FEVER  NAUSEA AND VOMITING THAT IS NOT CONTROLLED WITH YOUR NAUSEA MEDICATION  *UNUSUAL SHORTNESS OF BREATH  *UNUSUAL BRUISING OR BLEEDING  TENDERNESS IN MOUTH AND THROAT WITH OR WITHOUT PRESENCE OF ULCERS  *URINARY PROBLEMS  *BOWEL PROBLEMS  UNUSUAL RASH Items with * indicate a potential emergency and should be followed up as soon as possible.  Feel free to call the clinic should you have any questions or concerns. The clinic phone number is [phone].  Please show the [facility] at check-in to the Emergency Department and triage nurse.

## 2019-12-27 ENCOUNTER — Ambulatory Visit (HOSPITAL_COMMUNITY)
Admission: RE | Admit: 2019-12-27 | Discharge: 2019-12-27 | Disposition: A | Discharge status: Home / Self Care | Payer: 59 | Source: Ambulatory Visit | Attending: Hematology & Oncology | Admitting: Hematology & Oncology

## 2019-12-27 ENCOUNTER — Other Ambulatory Visit: Payer: Self-pay

## 2019-12-27 ENCOUNTER — Encounter: Payer: Self-pay | Admitting: *Deleted

## 2019-12-27 DIAGNOSIS — C160 Malignant neoplasm of cardia: Secondary | ICD-10-CM | POA: Insufficient documentation

## 2019-12-27 LAB — GLUCOSE, CAPILLARY: Glucose-Capillary: 91 mg/dL (ref 70–99)

## 2019-12-27 MED ORDER — FLUDEOXYGLUCOSE F - 18 (FDG) INJECTION
7.6000 | Freq: Once | INTRAVENOUS | Status: AC
  Administered 2019-12-27: 7.6 via INTRAVENOUS

## 2020-01-03 ENCOUNTER — Other Ambulatory Visit: Payer: Self-pay

## 2020-01-03 ENCOUNTER — Ambulatory Visit (HOSPITAL_COMMUNITY): Payer: 59 | Attending: Cardiology

## 2020-01-03 DIAGNOSIS — C160 Malignant neoplasm of cardia: Secondary | ICD-10-CM | POA: Insufficient documentation

## 2020-01-03 DIAGNOSIS — Z0189 Encounter for other specified special examinations: Secondary | ICD-10-CM

## 2020-01-03 LAB — ECHOCARDIOGRAM LIMITED
Area-P 1/2: 2.93 cm2
S' Lateral: 2.9 cm

## 2020-01-04 ENCOUNTER — Inpatient Hospital Stay (HOSPITAL_BASED_OUTPATIENT_CLINIC_OR_DEPARTMENT_OTHER): Payer: 59 | Admitting: Family

## 2020-01-04 ENCOUNTER — Inpatient Hospital Stay: Payer: 59 | Attending: Hematology & Oncology

## 2020-01-04 ENCOUNTER — Inpatient Hospital Stay: Payer: 59

## 2020-01-04 ENCOUNTER — Other Ambulatory Visit: Payer: Self-pay

## 2020-01-04 ENCOUNTER — Encounter: Payer: Self-pay | Admitting: Family

## 2020-01-04 VITALS — BP 128/73 | HR 84 | Temp 98.3°F | Resp 18 | Ht 69.0 in | Wt 155.0 lb

## 2020-01-04 DIAGNOSIS — C160 Malignant neoplasm of cardia: Secondary | ICD-10-CM

## 2020-01-04 DIAGNOSIS — Z79899 Other long term (current) drug therapy: Secondary | ICD-10-CM | POA: Diagnosis not present

## 2020-01-04 DIAGNOSIS — G629 Polyneuropathy, unspecified: Secondary | ICD-10-CM | POA: Diagnosis not present

## 2020-01-04 DIAGNOSIS — Z5112 Encounter for antineoplastic immunotherapy: Secondary | ICD-10-CM | POA: Insufficient documentation

## 2020-01-04 DIAGNOSIS — Z7189 Other specified counseling: Secondary | ICD-10-CM

## 2020-01-04 DIAGNOSIS — C799 Secondary malignant neoplasm of unspecified site: Secondary | ICD-10-CM | POA: Insufficient documentation

## 2020-01-04 LAB — CBC WITH DIFFERENTIAL (CANCER CENTER ONLY)
Abs Immature Granulocytes: 0.01 10*3/uL (ref 0.00–0.07)
Basophils Absolute: 0 10*3/uL (ref 0.0–0.1)
Basophils Relative: 1 %
Eosinophils Absolute: 0.1 10*3/uL (ref 0.0–0.5)
Eosinophils Relative: 2 %
HCT: 39.2 % (ref 39.0–52.0)
Hemoglobin: 13.2 g/dL (ref 13.0–17.0)
Immature Granulocytes: 0 %
Lymphocytes Relative: 28 %
Lymphs Abs: 0.7 10*3/uL (ref 0.7–4.0)
MCH: 32.2 pg (ref 26.0–34.0)
MCHC: 33.7 g/dL (ref 30.0–36.0)
MCV: 95.6 fL (ref 80.0–100.0)
Monocytes Absolute: 0.4 10*3/uL (ref 0.1–1.0)
Monocytes Relative: 15 %
Neutro Abs: 1.4 10*3/uL — ABNORMAL LOW (ref 1.7–7.7)
Neutrophils Relative %: 54 %
Platelet Count: 141 10*3/uL — ABNORMAL LOW (ref 150–400)
RBC: 4.1 MIL/uL — ABNORMAL LOW (ref 4.22–5.81)
RDW: 15.1 % (ref 11.5–15.5)
WBC Count: 2.6 10*3/uL — ABNORMAL LOW (ref 4.0–10.5)
nRBC: 0 % (ref 0.0–0.2)

## 2020-01-04 LAB — CMP (CANCER CENTER ONLY)
ALT: 18 U/L (ref 0–44)
AST: 26 U/L (ref 15–41)
Albumin: 3.7 g/dL (ref 3.5–5.0)
Alkaline Phosphatase: 136 U/L — ABNORMAL HIGH (ref 38–126)
Anion gap: 8 (ref 5–15)
BUN: 14 mg/dL (ref 6–20)
CO2: 28 mmol/L (ref 22–32)
Calcium: 9.2 mg/dL (ref 8.9–10.3)
Chloride: 105 mmol/L (ref 98–111)
Creatinine: 0.92 mg/dL (ref 0.61–1.24)
GFR, Estimated: >60 mL/min (ref >60)
Glucose, Bld: 119 mg/dL — ABNORMAL HIGH (ref 70–99)
Potassium: 3.8 mmol/L (ref 3.5–5.1)
Sodium: 141 mmol/L (ref 135–145)
Total Bilirubin: 0.4 mg/dL (ref 0.3–1.2)
Total Protein: 6.6 g/dL (ref 6.5–8.1)

## 2020-01-04 LAB — LACTATE DEHYDROGENASE: LDH: 136 U/L (ref 98–192)

## 2020-01-04 MED ORDER — SODIUM CHLORIDE 0.9 % IV SOLN
10.0000 mg | Freq: Once | INTRAVENOUS | Status: AC
  Administered 2020-01-04: 10 mg via INTRAVENOUS
  Filled 2020-01-04: qty 10

## 2020-01-04 MED ORDER — DIPHENHYDRAMINE HCL 25 MG PO CAPS
ORAL_CAPSULE | ORAL | Status: AC
  Filled 2020-01-04: qty 2

## 2020-01-04 MED ORDER — DIPHENHYDRAMINE HCL 25 MG PO CAPS
50.0000 mg | ORAL_CAPSULE | Freq: Once | ORAL | Status: AC
  Administered 2020-01-04: 50 mg via ORAL

## 2020-01-04 MED ORDER — HEPARIN SOD (PORK) LOCK FLUSH 100 UNIT/ML IV SOLN
500.0000 [IU] | Freq: Once | INTRAVENOUS | Status: AC | PRN
  Administered 2020-01-04: 500 [IU]
  Filled 2020-01-04: qty 5

## 2020-01-04 MED ORDER — PALONOSETRON HCL INJECTION 0.25 MG/5ML
INTRAVENOUS | Status: AC
  Filled 2020-01-04: qty 5

## 2020-01-04 MED ORDER — FAM-TRASTUZUMAB DERUXTECAN-NXKI CHEMO 100 MG IV SOLR
500.0000 mg | Freq: Once | INTRAVENOUS | Status: AC
  Administered 2020-01-04: 500 mg via INTRAVENOUS
  Filled 2020-01-04: qty 25

## 2020-01-04 MED ORDER — ACETAMINOPHEN 325 MG PO TABS
650.0000 mg | ORAL_TABLET | Freq: Once | ORAL | Status: AC
  Administered 2020-01-04: 650 mg via ORAL

## 2020-01-04 MED ORDER — SODIUM CHLORIDE 0.9% FLUSH
10.0000 mL | INTRAVENOUS | Status: DC | PRN
  Administered 2020-01-04: 10 mL
  Filled 2020-01-04: qty 10

## 2020-01-04 MED ORDER — PALONOSETRON HCL INJECTION 0.25 MG/5ML
0.2500 mg | Freq: Once | INTRAVENOUS | Status: AC
  Administered 2020-01-04: 0.25 mg via INTRAVENOUS

## 2020-01-04 MED ORDER — ACETAMINOPHEN 325 MG PO TABS
ORAL_TABLET | ORAL | Status: AC
  Filled 2020-01-04: qty 2

## 2020-01-04 MED ORDER — DEXTROSE 5 % IV SOLN
Freq: Once | INTRAVENOUS | Status: AC
  Filled 2020-01-04: qty 250

## 2020-01-04 NOTE — Progress Notes (Signed)
Hematology and Oncology Follow Up Visit  Matthew Reynolds [id number] Nov 14, 1965 54 y.o. 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 His most recent PET scan showed continued positive response to treatment.  His ECHO yesterday showed an EF of 55-60%.  He states that his energy is good and enjoying football season with his family. No fever, chills, n/v, cough, rash, dizziness, SOB, chest pain, palpitations, abdominal pain or changes in bowel or bladder habits.  No blood loss noted. No abnormal bleeding. No bruising or petechiae.  No swelling or tenderness in his extremities.  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  He states that he did trip over a book at home and feel scraping his knee. Thankfully he was not seriously injured.  He denies syncope.  He has maintained a good appetite and is staying well hydrated. His weight is stable.   ECOG Performance Status: 1 - Symptomatic but completely ambulatory  Medications:  Allergies as of 01/04/2020   No Known Allergies     Medication List       Accurate as of January 04, 2020  9:37 AM. If you have any questions, ask your nurse or doctor.        Dexilant 60 MG capsule Generic drug: dexlansoprazole Take 1 capsule (60 mg total) by mouth daily.   lidocaine-prilocaine cream Commonly known as: EMLA Apply 1 application topically as  needed.   MULTI-VITAMIN DAILY PO Take by mouth.   prochlorperazine 10 MG tablet Commonly known as: COMPAZINE Take 1 tablet (10 mg total) by mouth every 6 (six) hours as needed (Nausea or vomiting).   sucralfate 1 GM/10ML suspension Commonly known as: CARAFATE Take 10 mLs (1 g total) by mouth 3 (three) times daily.       Allergies: No Known Allergies  Past Medical History, Surgical history, Social history, and Family History were reviewed and updated.  Review of Systems: All other 10 point review of systems is negative.   Physical Exam:  height is _0  (1.753 m) and weight is 155 lb (70.3 kg). His oral temperature is 98.3 F (36.8 C). His blood pressure is 128/73 and his pulse is 84. His respiration is 18 and oxygen saturation is 99%.   Wt Readings from Last 3 Encounters:  01/04/20 155 lb (70.3 kg)  12/13/19 153 lb 12 oz (69.7 kg)  12/13/19 153 lb 1.9 oz (69.5 kg)    Ocular: Sclerae unicteric, pupils equal, round and reactive to light Ear-nose-throat: Oropharynx clear, dentition fair Lymphatic: No cervical, supraclavicular or axillary adenopathy Lungs no rales or rhonchi, good excursion bilaterally Heart regular rate and rhythm, no murmur appreciated Abd soft, nontender, positive bowel sounds, no liver or spleen tip palpated on exam, no fluid wave  MSK no focal spinal tenderness, no joint edema Neuro: non-focal, well-oriented, appropriate affect Breasts: Deferred   Lab Results  Component Value Date   WBC  2.6 (L) 01/04/2020   HGB 13.2 01/04/2020   HCT 39.2 01/04/2020   MCV 95.6 01/04/2020   PLT 141 (L) 01/04/2020   Lab Results  Component Value Date   FERRITIN 222 12/13/2019   IRON 55 12/13/2019   TIBC 289 12/13/2019   UIBC 234 12/13/2019   IRONPCTSAT 19 (L) 12/13/2019   Lab Results  Component Value Date   RBC 4.10 (L) 01/04/2020   No results found for: KPAFRELGTCHN, LAMBDASER, KAPLAMBRATIO No results found for: IGGSERUM, IGA, IGMSERUM No results found  for: Ronnald Ramp, A1GS, A2GS, Tillman Sers, SPEI   Chemistry      Component Value Date/Time   NA 139 12/13/2019 0908   K 3.9 12/13/2019 0908   CL 104 12/13/2019 0908   CO2 28 12/13/2019 0908   BUN 11 12/13/2019 0908   CREATININE 0.87 12/13/2019 0908      Component Value Date/Time   CALCIUM 9.2 12/13/2019 0908   ALKPHOS 123 12/13/2019 0908   AST 28 12/13/2019 0908   ALT 18 12/13/2019 0908   BILITOT 0.6 12/13/2019 0908       [name] is a very pleasant 54yo caucasian gentleman with metastatic adenocarcinoma of the GE junction, HER-2 positive, PD-L1 positive. He continues to do well and have a nice response to therapy.  We will proceed with treatment today as planned.  Follow-up in 3 weeks.  He can contact our office with any questions or concerns.   Laverna Peace, NP 10/19/20219:37 AM

## 2020-01-04 NOTE — Progress Notes (Signed)
OK to treat with today's blood test results per Scherrie Bateman, NP.

## 2020-01-04 NOTE — Progress Notes (Signed)
Pt. Stable at discharge.

## 2020-01-04 NOTE — Patient Instructions (Signed)
[facility] Discharge Instructions for Patients Receiving Chemotherapy  Today you received the following chemotherapy agents Enhertu.  To help prevent nausea and vomiting after your treatment, we encourage you to take your nausea medication as indicated by your MD.   If you develop nausea and vomiting that is not controlled by your nausea medication, call the clinic.   BELOW ARE SYMPTOMS THAT SHOULD BE REPORTED IMMEDIATELY:  *FEVER GREATER THAN 100.5 F  *CHILLS WITH OR WITHOUT FEVER  NAUSEA AND VOMITING THAT IS NOT CONTROLLED WITH YOUR NAUSEA MEDICATION  *UNUSUAL SHORTNESS OF BREATH  *UNUSUAL BRUISING OR BLEEDING  TENDERNESS IN MOUTH AND THROAT WITH OR WITHOUT PRESENCE OF ULCERS  *URINARY PROBLEMS  *BOWEL PROBLEMS  UNUSUAL RASH Items with * indicate a potential emergency and should be followed up as soon as possible.  Feel free to call the clinic should you have any questions or concerns. The clinic phone number is [phone].  Please show the [facility] at check-in to the Emergency Department and triage nurse.

## 2020-01-25 ENCOUNTER — Inpatient Hospital Stay: Payer: 59

## 2020-01-25 ENCOUNTER — Inpatient Hospital Stay: Payer: 59 | Attending: Hematology & Oncology

## 2020-01-25 ENCOUNTER — Other Ambulatory Visit: Payer: Self-pay

## 2020-01-25 ENCOUNTER — Inpatient Hospital Stay (HOSPITAL_BASED_OUTPATIENT_CLINIC_OR_DEPARTMENT_OTHER): Payer: 59 | Admitting: Hematology & Oncology

## 2020-01-25 ENCOUNTER — Encounter: Payer: Self-pay | Admitting: Hematology & Oncology

## 2020-01-25 VITALS — BP 142/75 | HR 81 | Temp 98.2°F | Resp 18 | Wt 153.0 lb

## 2020-01-25 DIAGNOSIS — C160 Malignant neoplasm of cardia: Secondary | ICD-10-CM

## 2020-01-25 DIAGNOSIS — Z7189 Other specified counseling: Secondary | ICD-10-CM

## 2020-01-25 DIAGNOSIS — Z5111 Encounter for antineoplastic chemotherapy: Secondary | ICD-10-CM | POA: Insufficient documentation

## 2020-01-25 DIAGNOSIS — C799 Secondary malignant neoplasm of unspecified site: Secondary | ICD-10-CM

## 2020-01-25 LAB — CMP (CANCER CENTER ONLY)
ALT: 21 U/L (ref 0–44)
AST: 27 U/L (ref 15–41)
Albumin: 3.7 g/dL (ref 3.5–5.0)
Alkaline Phosphatase: 125 U/L (ref 38–126)
Anion gap: 7 (ref 5–15)
BUN: 13 mg/dL (ref 6–20)
CO2: 28 mmol/L (ref 22–32)
Calcium: 9.4 mg/dL (ref 8.9–10.3)
Chloride: 102 mmol/L (ref 98–111)
Creatinine: 0.89 mg/dL (ref 0.61–1.24)
GFR, Estimated: >60 mL/min (ref >60)
Glucose, Bld: 153 mg/dL — ABNORMAL HIGH (ref 70–99)
Potassium: 3.8 mmol/L (ref 3.5–5.1)
Sodium: 137 mmol/L (ref 135–145)
Total Bilirubin: 0.5 mg/dL (ref 0.3–1.2)
Total Protein: 6.5 g/dL (ref 6.5–8.1)

## 2020-01-25 LAB — CBC WITH DIFFERENTIAL (CANCER CENTER ONLY)
Abs Immature Granulocytes: 0.01 10*3/uL (ref 0.00–0.07)
Basophils Absolute: 0 10*3/uL (ref 0.0–0.1)
Basophils Relative: 1 %
Eosinophils Absolute: 0.1 10*3/uL (ref 0.0–0.5)
Eosinophils Relative: 3 %
HCT: 40.7 % (ref 39.0–52.0)
Hemoglobin: 13.8 g/dL (ref 13.0–17.0)
Immature Granulocytes: 0 %
Lymphocytes Relative: 29 %
Lymphs Abs: 0.7 10*3/uL (ref 0.7–4.0)
MCH: 32.2 pg (ref 26.0–34.0)
MCHC: 33.9 g/dL (ref 30.0–36.0)
MCV: 95.1 fL (ref 80.0–100.0)
Monocytes Absolute: 0.4 10*3/uL (ref 0.1–1.0)
Monocytes Relative: 14 %
Neutro Abs: 1.3 10*3/uL — ABNORMAL LOW (ref 1.7–7.7)
Neutrophils Relative %: 53 %
Platelet Count: 143 10*3/uL — ABNORMAL LOW (ref 150–400)
RBC: 4.28 MIL/uL (ref 4.22–5.81)
RDW: 15.2 % (ref 11.5–15.5)
WBC Count: 2.5 10*3/uL — ABNORMAL LOW (ref 4.0–10.5)
nRBC: 0 % (ref 0.0–0.2)

## 2020-01-25 LAB — LACTATE DEHYDROGENASE: LDH: 129 U/L (ref 98–192)

## 2020-01-25 MED ORDER — DIPHENHYDRAMINE HCL 25 MG PO CAPS
ORAL_CAPSULE | ORAL | Status: AC
  Filled 2020-01-25: qty 2

## 2020-01-25 MED ORDER — DEXTROSE 5 % IV SOLN
Freq: Once | INTRAVENOUS | Status: AC
  Filled 2020-01-25: qty 250

## 2020-01-25 MED ORDER — HEPARIN SOD (PORK) LOCK FLUSH 100 UNIT/ML IV SOLN
500.0000 [IU] | Freq: Once | INTRAVENOUS | Status: AC | PRN
  Administered 2020-01-25: 500 [IU]
  Filled 2020-01-25: qty 5

## 2020-01-25 MED ORDER — ACETAMINOPHEN 325 MG PO TABS
650.0000 mg | ORAL_TABLET | Freq: Once | ORAL | Status: AC
  Administered 2020-01-25: 650 mg via ORAL

## 2020-01-25 MED ORDER — SODIUM CHLORIDE 0.9% FLUSH
10.0000 mL | INTRAVENOUS | Status: DC | PRN
  Administered 2020-01-25: 10 mL
  Filled 2020-01-25: qty 10

## 2020-01-25 MED ORDER — ACETAMINOPHEN 325 MG PO TABS
ORAL_TABLET | ORAL | Status: AC
  Filled 2020-01-25: qty 2

## 2020-01-25 MED ORDER — FAM-TRASTUZUMAB DERUXTECAN-NXKI CHEMO 100 MG IV SOLR
450.0000 mg | Freq: Once | INTRAVENOUS | Status: AC
  Administered 2020-01-25: 450 mg via INTRAVENOUS
  Filled 2020-01-25: qty 22.5

## 2020-01-25 MED ORDER — PALONOSETRON HCL INJECTION 0.25 MG/5ML
INTRAVENOUS | Status: AC
  Filled 2020-01-25: qty 5

## 2020-01-25 MED ORDER — PALONOSETRON HCL INJECTION 0.25 MG/5ML
0.2500 mg | Freq: Once | INTRAVENOUS | Status: AC
  Administered 2020-01-25: 0.25 mg via INTRAVENOUS

## 2020-01-25 MED ORDER — DIPHENHYDRAMINE HCL 25 MG PO CAPS
50.0000 mg | ORAL_CAPSULE | Freq: Once | ORAL | Status: AC
  Administered 2020-01-25: 25 mg via ORAL

## 2020-01-25 MED ORDER — SODIUM CHLORIDE 0.9 % IV SOLN
10.0000 mg | Freq: Once | INTRAVENOUS | Status: AC
  Administered 2020-01-25: 10 mg via INTRAVENOUS
  Filled 2020-01-25: qty 10

## 2020-01-25 NOTE — Progress Notes (Signed)
Pt discharged in no apparent distress. Pt left ambulatory without assistance. Pt aware of discharge instructions and verbalized understanding and had no further questions.  

## 2020-01-25 NOTE — Progress Notes (Signed)
Per patient request & ok per Dr Marin Olp, Benadryl dose decreased to 25mg .

## 2020-01-25 NOTE — Progress Notes (Signed)
Hematology and Oncology Follow Up Visit  Matthew Reynolds [id number] 1965/08/03 54 y.o. 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  He looks great.  He feels good.  He is last PET scan was done a month ago and everything is still in remission.  The Enhertu has really been effective.  Again, I thought all along that the fact that his tumor had the HER-2 gene was the key to treating him.  He has had no problems swallowing.  He has had no problems with bleeding.  There is no change in bowel or bladder habits.  He has had no problems with pain.  His last echocardiogram was done a month ago.  His left ventricular ejection fraction was 55-60%.  He and his wife have been traveling.  The go to the mountains.  I am so happy that he has had a good quality of life.  Overall, [name].   Medications:  Allergies as of 01/25/2020   No Known Allergies     Medication List       Accurate as of January 25, 2020  9:37 AM. If you have any questions, ask your nurse or doctor.        Dexilant 60 MG capsule Generic drug: dexlansoprazole Take 1 capsule (60 mg total) by mouth daily.   Eysuvis 0.25 % Susp Generic drug: Loteprednol Etabonate Instill 1 drop into both eyes twice a day   lidocaine-prilocaine cream Commonly known as: EMLA Apply 1 application topically as needed.    MULTI-VITAMIN DAILY PO Take by mouth.   prochlorperazine 10 MG tablet Commonly known as: COMPAZINE Take 1 tablet (10 mg total) by mouth every 6 (six) hours as needed (Nausea or vomiting).   sucralfate 1 GM/10ML suspension Commonly known as: CARAFATE Take 10 mLs (1 g total) by mouth 3 (three) times daily.       Allergies: No Known Allergies  Past Medical History, Surgical history, Social history, and Family History were reviewed and updated.  Review of Systems: Review of Systems  Constitutional: Negative.   HENT: Negative.   Eyes: Negative.   Respiratory: Negative.   Cardiovascular: Negative.   Gastrointestinal: Negative.   Genitourinary: Negative.   Musculoskeletal: Negative.   Skin: Negative.   Neurological: Negative.   Endo/Heme/Allergies: Negative.   Psychiatric/Behavioral: Negative.       Physical Exam:  weight is 153 lb (69.4 kg). His oral temperature is 98.2 F (36.8 C). His blood pressure is 142/75 (abnormal) and his pulse is 81. His respiration is 18 and oxygen saturation is 100%.   Wt Readings from Last 3 Encounters:  01/25/20 153 lb (69.4 kg)  01/04/20 155 lb (70.3 kg)  12/13/19 153 lb 12 oz (69.7 kg)    Physical Exam Vitals reviewed.  HENT:     Head: Normocephalic and atraumatic.  Eyes:     Pupils: Pupils  are equal, round, and reactive to light.  Cardiovascular:     Rate and Rhythm: Normal rate and regular rhythm.     Heart sounds: Normal heart sounds.  Pulmonary:     Effort: Pulmonary effort is normal.     Breath sounds: Normal breath sounds.  Abdominal:     General: Bowel sounds are normal.     Palpations: Abdomen is soft.  Musculoskeletal:        General: No tenderness or deformity. Normal range of motion.     Cervical back: Normal range of motion.  Lymphadenopathy:     Cervical: No cervical adenopathy.  Skin:    General: Skin is warm and dry.     Findings: No erythema or rash.  Neurological:     Mental Status: He is alert and  oriented to person, place, and time.  Psychiatric:        Behavior: Behavior normal.        Thought Content: Thought content normal.        Judgment: Judgment normal.      Lab Results  Component Value Date   WBC 2.5 (L) 01/25/2020   HGB 13.8 01/25/2020   HCT 40.7 01/25/2020   MCV 95.1 01/25/2020   PLT 143 (L) 01/25/2020   Lab Results  Component Value Date   FERRITIN 222 12/13/2019   IRON 55 12/13/2019   TIBC 289 12/13/2019   UIBC 234 12/13/2019   IRONPCTSAT 19 (L) 12/13/2019   Lab Results  Component Value Date   RBC 4.28 01/25/2020   No results found for: KPAFRELGTCHN, LAMBDASER, KAPLAMBRATIO No results found for: IGGSERUM, IGA, IGMSERUM No results found for: Ronnald Ramp, A1GS, A2GS, Violet Baldy, MSPIKE, SPEI   Chemistry      Component Value Date/Time   NA 141 01/04/2020 0900   K 3.8 01/04/2020 0900   CL 105 01/04/2020 0900   CO2 28 01/04/2020 0900   BUN 14 01/04/2020 0900   CREATININE 0.92 01/04/2020 0900      Component Value Date/Time   CALCIUM 9.2 01/04/2020 0900   ALKPHOS 136 (H) 01/04/2020 0900   AST 26 01/04/2020 0900   ALT 18 01/04/2020 0900   BILITOT 0.4 01/04/2020 0900       [name] is a very pleasant 54yo caucasian gentleman with metastatic adenocarcinoma of the GE junction, HER-2 positive, PD-L1 positive.  He continues to do well and have a nice response to therapy.  We will proceed with treatment today as planned.   Follow-up in 3 weeks.   He can contact our office with any questions or concerns.   [name], MD 11/9/20219:37 AM

## 2020-01-25 NOTE — Patient Instructions (Signed)
Fam-Trastuzumab deruxtecan injection What is this medicine? TRASTUZUMAB DERUXTECAN (tras TOOZ eu mab DER ux TEE kan) is a monoclonal antibody combined with chemotherapy. It is used to treat breast cancer. This medicine may be used for other purposes; ask your health care provider or pharmacist if you have questions. COMMON BRAND NAME(S): ENHERTU What should I tell my health care provider before I take this medicine? They need to know if you have any of these conditions:  heart disease  heart failure  infection (especially a virus infection such as chickenpox, cold sores, or herpes)  liver disease  lung or breathing disease, like asthma  an unusual or allergic reaction to fam-trastuzumab deruxtecan, other medications, foods, dyes, or preservatives  pregnant or trying to get pregnant  breast-feeding How should I use this medicine? This medicine is for infusion into a vein. It is given by a health care professional in a hospital or clinic setting. Talk to your pediatrician regarding the use of this medicine in children. Special care may be needed. Overdosage: If you think you have taken too much of this medicine contact a poison control center or emergency room at once. NOTE: This medicine is only for you. Do not share this medicine with others. What if I miss a dose? It is important not to miss your dose. Call your doctor or health care professional if you are unable to keep an appointment. What may interact with this medicine? Interaction studies have not been performed. This list may not describe all possible interactions. Give your health care provider a list of all the medicines, herbs, non-prescription drugs, or dietary supplements you use. Also tell them if you smoke, drink alcohol, or use illegal drugs. Some items may interact with your medicine. What should I watch for while using this medicine? Visit your healthcare professional for regular checks on your progress. Tell your  healthcare professional if your symptoms do not start to get better or if they get worse. Your condition will be monitored carefully while you are receiving this medicine. Do not become pregnant while taking this medicine or for 7 months after stopping it. Women should inform their healthcare professional if they wish to become pregnant or think they might be pregnant. Men should not father a child while taking this medicine and for 4 months after stopping it. There is potential for serious side effects to an unborn child. Talk to your healthcare professional for more information. Do not breast-feed an infant while taking this medicine or for 7 months after the last dose. This medicine has caused decreased sperm counts in some men. This may make it more difficult to father a child. Talk to your healthcare professional if you are concerned about your fertility. This medicine may increase your risk to bruise or bleed. Call your health care professional if you notice any unusual bleeding. Be careful brushing or flossing your teeth or using a toothpick because you may get an infection or bleed more easily. If you have any dental work done, tell your dentist you are receiving this medicine. This medicine may cause dry eyes [and blurred vision]. If you wear contact lenses, you may feel some discomfort. Lubricating eye drops may help. See your healthcare professional if the problem does not go away or is severe. Call your healthcare professional for advice if you get a fever, chills, or sore throat, or other symptoms of a cold or flu. Do not treat yourself. This medicine decreases your body's ability to fight infections. Try to  avoid being around people who are sick. Avoid taking medicines that contain aspirin, acetaminophen, ibuprofen, naproxen, or ketoprofen unless instructed by your healthcare professional. These medicines may hide a fever. What side effects may I notice from receiving this medicine? Side  effects that you should report to your doctor or health care professional as soon as possible:  allergic reactions like skin rash, itching or hives, swelling of the face, lips, or tongue  breathing problems  cough  nausea, vomiting  signs and symptoms of bleeding such as bloody or black, tarry stools; red or dark-brown urine; spitting up blood or brown material that looks like coffee grounds; red spots on the skin; unusual bruising or bleeding from the eye, gums, or nose  signs and symptoms of heart failure like breathing problems, fast, irregular heartbeat, sudden weight gain; swelling of the ankles, feet, hands; unusually weak or tired  signs and symptoms of infection like fever; chills; cough; sore throat; pain or trouble passing urine  signs and symptoms of low red blood cells or anemia such as unusually weak or tired; feeling faint or lightheaded; falls; breathing problems Side effects that usually do not require medical attention (report these to your doctor or health care professional if they continue or are bothersome):  constipation  diarrhea  dry eyes  hair loss  loss of appetite  mouth sores  rash This list may not describe all possible side effects. Call your doctor for medical advice about side effects. You may report side effects to FDA at 1-800-FDA-1088. Where should I keep my medicine? This drug is given in a hospital or clinic and will not be stored at home. NOTE: This sheet is a summary. It may not cover all possible information. If you have questions about this medicine, talk to your doctor, pharmacist, or health care provider.  2020 Elsevier/Gold Standard (2018-05-12 15:07:27)

## 2020-01-25 NOTE — Patient Instructions (Signed)

## 2020-01-25 NOTE — Progress Notes (Signed)
Okay to treat today per [name]. Patient has had weight loss, decrease dose to Enhertu 450 mg today. Dose decreased per his instructions.

## 2020-02-15 ENCOUNTER — Inpatient Hospital Stay: Payer: 59

## 2020-02-15 ENCOUNTER — Inpatient Hospital Stay (HOSPITAL_BASED_OUTPATIENT_CLINIC_OR_DEPARTMENT_OTHER): Payer: 59 | Admitting: Family

## 2020-02-15 ENCOUNTER — Encounter: Payer: Self-pay | Admitting: Family

## 2020-02-15 ENCOUNTER — Other Ambulatory Visit: Payer: Self-pay

## 2020-02-15 ENCOUNTER — Telehealth: Payer: Self-pay | Admitting: Family

## 2020-02-15 VITALS — BP 127/78 | HR 95 | Temp 98.7°F | Resp 18 | Wt 154.8 lb

## 2020-02-15 DIAGNOSIS — C799 Secondary malignant neoplasm of unspecified site: Secondary | ICD-10-CM

## 2020-02-15 DIAGNOSIS — Z5111 Encounter for antineoplastic chemotherapy: Secondary | ICD-10-CM | POA: Diagnosis not present

## 2020-02-15 DIAGNOSIS — C160 Malignant neoplasm of cardia: Secondary | ICD-10-CM

## 2020-02-15 DIAGNOSIS — Z7189 Other specified counseling: Secondary | ICD-10-CM

## 2020-02-15 LAB — CMP (CANCER CENTER ONLY)
ALT: 18 U/L (ref 0–44)
AST: 25 U/L (ref 15–41)
Albumin: 3.6 g/dL (ref 3.5–5.0)
Alkaline Phosphatase: 136 U/L — ABNORMAL HIGH (ref 38–126)
Anion gap: 6 (ref 5–15)
BUN: 13 mg/dL (ref 6–20)
CO2: 28 mmol/L (ref 22–32)
Calcium: 9.2 mg/dL (ref 8.9–10.3)
Chloride: 105 mmol/L (ref 98–111)
Creatinine: 0.86 mg/dL (ref 0.61–1.24)
GFR, Estimated: >60 mL/min (ref >60)
Glucose, Bld: 116 mg/dL — ABNORMAL HIGH (ref 70–99)
Potassium: 3.7 mmol/L (ref 3.5–5.1)
Sodium: 139 mmol/L (ref 135–145)
Total Bilirubin: 0.5 mg/dL (ref 0.3–1.2)
Total Protein: 6.3 g/dL — ABNORMAL LOW (ref 6.5–8.1)

## 2020-02-15 LAB — CBC WITH DIFFERENTIAL (CANCER CENTER ONLY)
Abs Immature Granulocytes: 0.01 10*3/uL (ref 0.00–0.07)
Basophils Absolute: 0 10*3/uL (ref 0.0–0.1)
Basophils Relative: 1 %
Eosinophils Absolute: 0.1 10*3/uL (ref 0.0–0.5)
Eosinophils Relative: 2 %
HCT: 38.9 % — ABNORMAL LOW (ref 39.0–52.0)
Hemoglobin: 13.3 g/dL (ref 13.0–17.0)
Immature Granulocytes: 0 %
Lymphocytes Relative: 26 %
Lymphs Abs: 0.7 10*3/uL (ref 0.7–4.0)
MCH: 32.4 pg (ref 26.0–34.0)
MCHC: 34.2 g/dL (ref 30.0–36.0)
MCV: 94.9 fL (ref 80.0–100.0)
Monocytes Absolute: 0.4 10*3/uL (ref 0.1–1.0)
Monocytes Relative: 13 %
Neutro Abs: 1.5 10*3/uL — ABNORMAL LOW (ref 1.7–7.7)
Neutrophils Relative %: 58 %
Platelet Count: 131 10*3/uL — ABNORMAL LOW (ref 150–400)
RBC: 4.1 MIL/uL — ABNORMAL LOW (ref 4.22–5.81)
RDW: 14.9 % (ref 11.5–15.5)
WBC Count: 2.6 10*3/uL — ABNORMAL LOW (ref 4.0–10.5)
nRBC: 0 % (ref 0.0–0.2)

## 2020-02-15 LAB — LACTATE DEHYDROGENASE: LDH: 117 U/L (ref 98–192)

## 2020-02-15 MED ORDER — SODIUM CHLORIDE 0.9 % IV SOLN
10.0000 mg | Freq: Once | INTRAVENOUS | Status: AC
  Administered 2020-02-15: 10 mg via INTRAVENOUS
  Filled 2020-02-15: qty 10

## 2020-02-15 MED ORDER — DEXTROSE 5 % IV SOLN
Freq: Once | INTRAVENOUS | Status: AC
  Filled 2020-02-15: qty 250

## 2020-02-15 MED ORDER — PALONOSETRON HCL INJECTION 0.25 MG/5ML
0.2500 mg | Freq: Once | INTRAVENOUS | Status: AC
  Administered 2020-02-15: 0.25 mg via INTRAVENOUS

## 2020-02-15 MED ORDER — ACETAMINOPHEN 325 MG PO TABS
650.0000 mg | ORAL_TABLET | Freq: Once | ORAL | Status: AC
  Administered 2020-02-15: 650 mg via ORAL

## 2020-02-15 MED ORDER — SODIUM CHLORIDE 0.9% FLUSH
10.0000 mL | INTRAVENOUS | Status: DC | PRN
  Administered 2020-02-15: 10 mL
  Filled 2020-02-15: qty 10

## 2020-02-15 MED ORDER — ACETAMINOPHEN 325 MG PO TABS
ORAL_TABLET | ORAL | Status: AC
  Filled 2020-02-15: qty 2

## 2020-02-15 MED ORDER — HEPARIN SOD (PORK) LOCK FLUSH 100 UNIT/ML IV SOLN
500.0000 [IU] | Freq: Once | INTRAVENOUS | Status: AC | PRN
  Administered 2020-02-15: 500 [IU]
  Filled 2020-02-15: qty 5

## 2020-02-15 MED ORDER — DIPHENHYDRAMINE HCL 25 MG PO CAPS
ORAL_CAPSULE | ORAL | Status: AC
  Filled 2020-02-15: qty 2

## 2020-02-15 MED ORDER — PALONOSETRON HCL INJECTION 0.25 MG/5ML
INTRAVENOUS | Status: AC
  Filled 2020-02-15: qty 5

## 2020-02-15 MED ORDER — FAM-TRASTUZUMAB DERUXTECAN-NXKI CHEMO 100 MG IV SOLR
450.0000 mg | Freq: Once | INTRAVENOUS | Status: AC
  Administered 2020-02-15: 450 mg via INTRAVENOUS
  Filled 2020-02-15: qty 22.5

## 2020-02-15 MED ORDER — DIPHENHYDRAMINE HCL 25 MG PO CAPS
50.0000 mg | ORAL_CAPSULE | Freq: Once | ORAL | Status: AC
  Administered 2020-02-15: 50 mg via ORAL

## 2020-02-15 NOTE — Patient Instructions (Signed)
Implanted Port Insertion, Care After °This sheet gives you information about how to care for yourself after your procedure. Your health care provider may also give you more specific instructions. If you have problems or questions, contact your health care provider. °What can I expect after the procedure? °After the procedure, it is common to have: °· Discomfort at the port insertion site. °· Bruising on the skin over the port. This should improve over 3-4 days. °Follow these instructions at home: °Port care °· After your port is placed, you will get a manufacturer's information card. The card has information about your port. Keep this card with you at all times. °· Take care of the port as told by your health care provider. Ask your health care provider if you or a family member can get training for taking care of the port at home. A home health care nurse may also take care of the port. °· Make sure to remember what type of port you have. °Incision care ° °  ° °· Follow instructions from your health care provider about how to take care of your port insertion site. Make sure you: °? Wash your hands with soap and water before and after you change your bandage (dressing). If soap and water are not available, use hand sanitizer. °? Change your dressing as told by your health care provider. °? Leave stitches (sutures), skin glue, or adhesive strips in place. These skin closures may need to stay in place for 2 weeks or longer. If adhesive strip edges start to loosen and curl up, you may trim the loose edges. Do not remove adhesive strips completely unless your health care provider tells you to do that. °· Check your port insertion site every day for signs of infection. Check for: °? Redness, swelling, or pain. °? Fluid or blood. °? Warmth. °? Pus or a bad smell. °Activity °· Return to your normal activities as told by your health care provider. Ask your health care provider what activities are safe for you. °· Do not  lift anything that is heavier than 10 lb (4.5 kg), or the limit that you are told, until your health care provider says that it is safe. °General instructions °· Take over-the-counter and prescription medicines only as told by your health care provider. °· Do not take baths, swim, or use a hot tub until your health care provider approves. Ask your health care provider if you may take showers. You may only be allowed to take sponge baths. °· Do not drive for 24 hours if you were given a sedative during your procedure. °· Wear a medical alert bracelet in case of an emergency. This will tell any health care providers that you have a port. °· Keep all follow-up visits as told by your health care provider. This is important. °Contact a health care provider if: °· You cannot flush your port with saline as directed, or you cannot draw blood from the port. °· You have a fever or chills. °· You have redness, swelling, or pain around your port insertion site. °· You have fluid or blood coming from your port insertion site. °· Your port insertion site feels warm to the touch. °· You have pus or a bad smell coming from the port insertion site. °Get help right away if: °· You have chest pain or shortness of breath. °· You have bleeding from your port that you cannot control. °Summary °· Take care of the port as told by your health   care provider. Keep the manufacturer's information card with you at all times. °· Change your dressing as told by your health care provider. °· Contact a health care provider if you have a fever or chills or if you have redness, swelling, or pain around your port insertion site. °· Keep all follow-up visits as told by your health care provider. °This information is not intended to replace advice given to you by your health care provider. Make sure you discuss any questions you have with your health care provider. °Document Revised: 09/30/2017 Document Reviewed: 09/30/2017 °Elsevier Patient Education ©  2020 [employer] Inc[employer] ° °

## 2020-02-15 NOTE — Telephone Encounter (Signed)
Appointments updated per 11/30 los. Patient has My Chart Access

## 2020-02-15 NOTE — Progress Notes (Signed)
Hematology and Oncology Follow Up Visit  Matthew Reynolds [id number] 10-13-1965 54 y.o. 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 He is doing quite well and has no complaints at this time.  He denies fatigue. He had a wonderful Thanksgiving with his family.  His appetite is great and he is staying well hydrated. His weight is stable.  No fever, chills, n/v, cough, rash, dizziness, SOB, chest pain, palpitations, abdominal pain/bloating or changes in bowel or bladder habits.  No bleeding, no bruising or petechiae.  No swelling or tenderness in his extremities.  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  No falls or syncopal episodes to report.   ECOG Performance Status: 1 - Symptomatic but completely ambulatory  Medications:  Allergies as of 02/15/2020   No Known Allergies     Medication List       Accurate as of February 15, 2020  9:53 AM. If you have any questions, ask your nurse or doctor.        Dexilant 60 MG capsule Generic drug: dexlansoprazole Take 1 capsule (60 mg total) by mouth daily.   Eysuvis 0.25 % Susp Generic drug: Loteprednol Etabonate Instill 1 drop into both eyes twice a day   lidocaine-prilocaine cream Commonly known as: EMLA Apply 1 application topically as needed.   MULTI-VITAMIN DAILY PO Take by mouth.   prochlorperazine 10 MG  tablet Commonly known as: COMPAZINE Take 1 tablet (10 mg total) by mouth every 6 (six) hours as needed (Nausea or vomiting).   sucralfate 1 GM/10ML suspension Commonly known as: CARAFATE Take 10 mLs (1 g total) by mouth 3 (three) times daily.       Allergies: No Known Allergies  Past Medical History, Surgical history, Social history, and Family History were reviewed and updated.  Review of Systems: All other 10 point review of systems is negative.   Physical Exam:  weight is 154 lb 12.8 oz (70.2 kg). His oral temperature is 98.7 F (37.1 C). His blood pressure is 127/78 and his pulse is 95. His respiration is 18 and oxygen saturation is 99%.   Wt Readings from Last 3 Encounters:  02/15/20 154 lb 12.8 oz (70.2 kg)  01/25/20 153 lb (69.4 kg)  01/04/20 155 lb (70.3 kg)    Ocular: Sclerae unicteric, pupils equal, round and reactive to light Ear-nose-throat: Oropharynx clear, dentition fair Lymphatic: No cervical or supraclavicular adenopathy Lungs no rales or rhonchi, good excursion bilaterally Heart regular rate and rhythm, no murmur appreciated Abd soft, nontender, positive bowel sounds MSK no focal spinal tenderness, no joint edema Neuro: non-focal, well-oriented, appropriate affect Breasts: Deferred   Lab Results  Component Value Date   WBC 2.6 (L) 02/15/2020   HGB 13.3 02/15/2020   HCT 38.9 (L) 02/15/2020   MCV 94.9 02/15/2020   PLT 131 (L) 02/15/2020   Lab Results  Component Value  Date   FERRITIN 222 12/13/2019   IRON 55 12/13/2019   TIBC 289 12/13/2019   UIBC 234 12/13/2019   IRONPCTSAT 19 (L) 12/13/2019   Lab Results  Component Value Date   RBC 4.10 (L) 02/15/2020   No results found for: KPAFRELGTCHN, LAMBDASER, KAPLAMBRATIO No results found for: IGGSERUM, IGA, IGMSERUM No results found for: Ronnald Ramp, A1GS, A2GS, BETS, BETA2SER, GAMS, MSPIKE, SPEI   Chemistry      Component Value Date/Time   NA 137 01/25/2020 0900   K 3.8 01/25/2020  0900   CL 102 01/25/2020 0900   CO2 28 01/25/2020 0900   BUN 13 01/25/2020 0900   CREATININE 0.89 01/25/2020 0900      Component Value Date/Time   CALCIUM 9.4 01/25/2020 0900   ALKPHOS 125 01/25/2020 0900   AST 27 01/25/2020 0900   ALT 21 01/25/2020 0900   BILITOT 0.5 01/25/2020 0900       [name] is a very pleasant 54yo caucasian gentleman with metastatic adenocarcinoma of the GE junction, HER-2 positive, PD-L1 positive. He continues to do well and has had a great response to therapy.  We will proceed with treatment today as planned.  I spoke with [name] and we will now move his appointments to every 4 weeks.  He is in agreement with the plan and was encouraged to contact our office with any questions or concerns.   Laverna Peace, NP 11/30/20219:53 AM

## 2020-02-15 NOTE — Patient Instructions (Addendum)
Fam-Trastuzumab deruxtecan injection What is this medicine? TRASTUZUMAB DERUXTECAN (tras TOOZ eu mab DER ux TEE kan) is a monoclonal antibody combined with chemotherapy. It is used to treat breast cancer. This medicine may be used for other purposes; ask your health care provider or pharmacist if you have questions. COMMON BRAND NAME(S): ENHERTU What should I tell my health care provider before I take this medicine? They need to know if you have any of these conditions:  heart disease  heart failure  infection (especially a virus infection such as chickenpox, cold sores, or herpes)  liver disease  lung or breathing disease, like asthma  an unusual or allergic reaction to fam-trastuzumab deruxtecan, other medications, foods, dyes, or preservatives  pregnant or trying to get pregnant  breast-feeding How should I use this medicine? This medicine is for infusion into a vein. It is given by a health care professional in a hospital or clinic setting. Talk to your pediatrician regarding the use of this medicine in children. Special care may be needed. Overdosage: If you think you have taken too much of this medicine contact a poison control center or emergency room at once. NOTE: This medicine is only for you. Do not share this medicine with others. What if I miss a dose? It is important not to miss your dose. Call your doctor or health care professional if you are unable to keep an appointment. What may interact with this medicine? Interaction studies have not been performed. This list may not describe all possible interactions. Give your health care provider a list of all the medicines, herbs, non-prescription drugs, or dietary supplements you use. Also tell them if you smoke, drink alcohol, or use illegal drugs. Some items may interact with your medicine. What should I watch for while using this medicine? Visit your healthcare professional for regular checks on your progress. Tell your  healthcare professional if your symptoms do not start to get better or if they get worse. Your condition will be monitored carefully while you are receiving this medicine. Do not become pregnant while taking this medicine or for 7 months after stopping it. Women should inform their healthcare professional if they wish to become pregnant or think they might be pregnant. Men should not father a child while taking this medicine and for 4 months after stopping it. There is potential for serious side effects to an unborn child. Talk to your healthcare professional for more information. Do not breast-feed an infant while taking this medicine or for 7 months after the last dose. This medicine has caused decreased sperm counts in some men. This may make it more difficult to father a child. Talk to your healthcare professional if you are concerned about your fertility. This medicine may increase your risk to bruise or bleed. Call your health care professional if you notice any unusual bleeding. Be careful brushing or flossing your teeth or using a toothpick because you may get an infection or bleed more easily. If you have any dental work done, tell your dentist you are receiving this medicine. This medicine may cause dry eyes [and blurred vision]. If you wear contact lenses, you may feel some discomfort. Lubricating eye drops may help. See your healthcare professional if the problem does not go away or is severe. Call your healthcare professional for advice if you get a fever, chills, or sore throat, or other symptoms of a cold or flu. Do not treat yourself. This medicine decreases your body's ability to fight infections. Try to  avoid being around people who are sick. Avoid taking medicines that contain aspirin, acetaminophen, ibuprofen, naproxen, or ketoprofen unless instructed by your healthcare professional. These medicines may hide a fever. What side effects may I notice from receiving this medicine? Side  effects that you should report to your doctor or health care professional as soon as possible:  allergic reactions like skin rash, itching or hives, swelling of the face, lips, or tongue  breathing problems  cough  nausea, vomiting  signs and symptoms of bleeding such as bloody or black, tarry stools; red or dark-brown urine; spitting up blood or brown material that looks like coffee grounds; red spots on the skin; unusual bruising or bleeding from the eye, gums, or nose  signs and symptoms of heart failure like breathing problems, fast, irregular heartbeat, sudden weight gain; swelling of the ankles, feet, hands; unusually weak or tired  signs and symptoms of infection like fever; chills; cough; sore throat; pain or trouble passing urine  signs and symptoms of low red blood cells or anemia such as unusually weak or tired; feeling faint or lightheaded; falls; breathing problems Side effects that usually do not require medical attention (report these to your doctor or health care professional if they continue or are bothersome):  constipation  diarrhea  dry eyes  hair loss  loss of appetite  mouth sores  rash This list may not describe all possible side effects. Call your doctor for medical advice about side effects. You may report side effects to FDA at 1-800-FDA-1088. Where should I keep my medicine? This drug is given in a hospital or clinic and will not be stored at home. NOTE: This sheet is a summary. It may not cover all possible information. If you have questions about this medicine, talk to your doctor, pharmacist, or health care provider.  2020 Elsevier/Gold Standard (2018-05-12 15:07:27) Pt discharged in no apparent distress. Pt left ambulatory without assistance. Pt aware of discharge instructions and verbalized understanding and had no further questions.

## 2020-03-06 ENCOUNTER — Encounter: Payer: Self-pay | Admitting: Hematology & Oncology

## 2020-03-06 ENCOUNTER — Other Ambulatory Visit: Payer: Self-pay

## 2020-03-06 DIAGNOSIS — Z20822 Contact with and (suspected) exposure to covid-19: Secondary | ICD-10-CM

## 2020-03-08 LAB — SARS-COV-2, NAA 2 DAY TAT

## 2020-03-08 LAB — NOVEL CORONAVIRUS, NAA: SARS-CoV-2, NAA: DETECTED — AB

## 2020-03-14 ENCOUNTER — Inpatient Hospital Stay: Payer: BC Managed Care – PPO

## 2020-03-14 ENCOUNTER — Inpatient Hospital Stay: Payer: BC Managed Care – PPO | Admitting: Hematology & Oncology

## 2020-03-21 ENCOUNTER — Encounter: Payer: Self-pay | Admitting: Hematology & Oncology

## 2020-03-27 ENCOUNTER — Other Ambulatory Visit: Payer: Self-pay

## 2020-03-27 ENCOUNTER — Inpatient Hospital Stay (HOSPITAL_BASED_OUTPATIENT_CLINIC_OR_DEPARTMENT_OTHER): Payer: BC Managed Care – PPO | Admitting: Family

## 2020-03-27 ENCOUNTER — Inpatient Hospital Stay: Payer: BC Managed Care – PPO | Attending: Hematology & Oncology

## 2020-03-27 ENCOUNTER — Inpatient Hospital Stay: Payer: BC Managed Care – PPO

## 2020-03-27 ENCOUNTER — Encounter: Payer: Self-pay | Admitting: Family

## 2020-03-27 ENCOUNTER — Telehealth: Payer: Self-pay | Admitting: Hematology & Oncology

## 2020-03-27 VITALS — BP 110/76 | HR 108 | Temp 98.5°F | Resp 18 | Ht 69.0 in | Wt 155.0 lb

## 2020-03-27 DIAGNOSIS — Z5112 Encounter for antineoplastic immunotherapy: Secondary | ICD-10-CM | POA: Insufficient documentation

## 2020-03-27 DIAGNOSIS — C160 Malignant neoplasm of cardia: Secondary | ICD-10-CM | POA: Diagnosis not present

## 2020-03-27 DIAGNOSIS — Z8616 Personal history of COVID-19: Secondary | ICD-10-CM | POA: Diagnosis not present

## 2020-03-27 DIAGNOSIS — Z79899 Other long term (current) drug therapy: Secondary | ICD-10-CM | POA: Diagnosis not present

## 2020-03-27 DIAGNOSIS — C799 Secondary malignant neoplasm of unspecified site: Secondary | ICD-10-CM

## 2020-03-27 DIAGNOSIS — Z9221 Personal history of antineoplastic chemotherapy: Secondary | ICD-10-CM | POA: Diagnosis not present

## 2020-03-27 DIAGNOSIS — C787 Secondary malignant neoplasm of liver and intrahepatic bile duct: Secondary | ICD-10-CM | POA: Insufficient documentation

## 2020-03-27 DIAGNOSIS — G629 Polyneuropathy, unspecified: Secondary | ICD-10-CM | POA: Diagnosis not present

## 2020-03-27 DIAGNOSIS — Z7189 Other specified counseling: Secondary | ICD-10-CM

## 2020-03-27 LAB — CBC WITH DIFFERENTIAL (CANCER CENTER ONLY)
Abs Immature Granulocytes: 0.01 10*3/uL (ref 0.00–0.07)
Basophils Absolute: 0 10*3/uL (ref 0.0–0.1)
Basophils Relative: 1 %
Eosinophils Absolute: 0.1 10*3/uL (ref 0.0–0.5)
Eosinophils Relative: 2 %
HCT: 41.5 % (ref 39.0–52.0)
Hemoglobin: 14.2 g/dL (ref 13.0–17.0)
Immature Granulocytes: 0 %
Lymphocytes Relative: 23 %
Lymphs Abs: 1.2 10*3/uL (ref 0.7–4.0)
MCH: 32.1 pg (ref 26.0–34.0)
MCHC: 34.2 g/dL (ref 30.0–36.0)
MCV: 93.9 fL (ref 80.0–100.0)
Monocytes Absolute: 0.7 10*3/uL (ref 0.1–1.0)
Monocytes Relative: 14 %
Neutro Abs: 3.1 10*3/uL (ref 1.7–7.7)
Neutrophils Relative %: 60 %
Platelet Count: 142 10*3/uL — ABNORMAL LOW (ref 150–400)
RBC: 4.42 MIL/uL (ref 4.22–5.81)
RDW: 14.3 % (ref 11.5–15.5)
WBC Count: 5 10*3/uL (ref 4.0–10.5)
nRBC: 0 % (ref 0.0–0.2)

## 2020-03-27 LAB — CMP (CANCER CENTER ONLY)
ALT: 19 U/L (ref 0–44)
AST: 27 U/L (ref 15–41)
Albumin: 3.6 g/dL (ref 3.5–5.0)
Alkaline Phosphatase: 131 U/L — ABNORMAL HIGH (ref 38–126)
Anion gap: 8 (ref 5–15)
BUN: 11 mg/dL (ref 6–20)
CO2: 28 mmol/L (ref 22–32)
Calcium: 9.6 mg/dL (ref 8.9–10.3)
Chloride: 106 mmol/L (ref 98–111)
Creatinine: 0.85 mg/dL (ref 0.61–1.24)
GFR, Estimated: >60 mL/min (ref >60)
Glucose, Bld: 79 mg/dL (ref 70–99)
Potassium: 3.8 mmol/L (ref 3.5–5.1)
Sodium: 142 mmol/L (ref 135–145)
Total Bilirubin: 0.5 mg/dL (ref 0.3–1.2)
Total Protein: 6.4 g/dL — ABNORMAL LOW (ref 6.5–8.1)

## 2020-03-27 LAB — LACTATE DEHYDROGENASE: LDH: 113 U/L (ref 98–192)

## 2020-03-27 MED ORDER — HEPARIN SOD (PORK) LOCK FLUSH 100 UNIT/ML IV SOLN
500.0000 [IU] | Freq: Once | INTRAVENOUS | Status: AC | PRN
  Administered 2020-03-27: 500 [IU]
  Filled 2020-03-27: qty 5

## 2020-03-27 MED ORDER — SODIUM CHLORIDE 0.9 % IV SOLN
10.0000 mg | Freq: Once | INTRAVENOUS | Status: AC
  Administered 2020-03-27: 10 mg via INTRAVENOUS
  Filled 2020-03-27: qty 10

## 2020-03-27 MED ORDER — DEXTROSE 5 % IV SOLN
Freq: Once | INTRAVENOUS | Status: AC
  Filled 2020-03-27: qty 250

## 2020-03-27 MED ORDER — FAM-TRASTUZUMAB DERUXTECAN-NXKI CHEMO 100 MG IV SOLR
450.0000 mg | Freq: Once | INTRAVENOUS | Status: AC
  Administered 2020-03-27: 450 mg via INTRAVENOUS
  Filled 2020-03-27: qty 22.5

## 2020-03-27 MED ORDER — ACETAMINOPHEN 325 MG PO TABS
ORAL_TABLET | ORAL | Status: AC
  Filled 2020-03-27: qty 2

## 2020-03-27 MED ORDER — PALONOSETRON HCL INJECTION 0.25 MG/5ML
INTRAVENOUS | Status: AC
  Filled 2020-03-27: qty 5

## 2020-03-27 MED ORDER — DIPHENHYDRAMINE HCL 25 MG PO CAPS
ORAL_CAPSULE | ORAL | Status: AC
  Filled 2020-03-27: qty 2

## 2020-03-27 MED ORDER — DIPHENHYDRAMINE HCL 25 MG PO CAPS
50.0000 mg | ORAL_CAPSULE | Freq: Once | ORAL | Status: AC
  Administered 2020-03-27: 50 mg via ORAL

## 2020-03-27 MED ORDER — PALONOSETRON HCL INJECTION 0.25 MG/5ML
0.2500 mg | Freq: Once | INTRAVENOUS | Status: AC
  Administered 2020-03-27: 0.25 mg via INTRAVENOUS

## 2020-03-27 MED ORDER — ACETAMINOPHEN 325 MG PO TABS
650.0000 mg | ORAL_TABLET | Freq: Once | ORAL | Status: AC
Start: 2020-03-27 — End: 2020-03-27
  Administered 2020-03-27: 650 mg via ORAL

## 2020-03-27 MED ORDER — DIPHENHYDRAMINE HCL 25 MG PO CAPS
ORAL_CAPSULE | ORAL | Status: AC
  Filled 2020-03-27: qty 1

## 2020-03-27 MED ORDER — SODIUM CHLORIDE 0.9% FLUSH
10.0000 mL | INTRAVENOUS | Status: DC | PRN
  Administered 2020-03-27: 10 mL
  Filled 2020-03-27: qty 10

## 2020-03-27 NOTE — Patient Instructions (Signed)
Fam-Trastuzumab deruxtecan injection What is this medicine? TRASTUZUMAB DERUXTECAN (tras TOOZ eu mab DER ux TEE kan) is a monoclonal antibody combined with chemotherapy. It is used to treat breast cancer. This medicine may be used for other purposes; ask your health care provider or pharmacist if you have questions. COMMON BRAND NAME(S): ENHERTU What should I tell my health care provider before I take this medicine? They need to know if you have any of these conditions:  heart disease  heart failure  infection (especially a virus infection such as chickenpox, cold sores, or herpes)  liver disease  lung or breathing disease, like asthma  an unusual or allergic reaction to fam-trastuzumab deruxtecan, other medications, foods, dyes, or preservatives  pregnant or trying to get pregnant  breast-feeding How should I use this medicine? This medicine is for infusion into a vein. It is given by a health care professional in a hospital or clinic setting. Talk to your pediatrician regarding the use of this medicine in children. Special care may be needed. Overdosage: If you think you have taken too much of this medicine contact a poison control center or emergency room at once. NOTE: This medicine is only for you. Do not share this medicine with others. What if I miss a dose? It is important not to miss your dose. Call your doctor or health care professional if you are unable to keep an appointment. What may interact with this medicine? Interaction studies have not been performed. This list may not describe all possible interactions. Give your health care provider a list of all the medicines, herbs, non-prescription drugs, or dietary supplements you use. Also tell them if you smoke, drink alcohol, or use illegal drugs. Some items may interact with your medicine. What should I watch for while using this medicine? Visit your healthcare professional for regular checks on your progress. Tell your  healthcare professional if your symptoms do not start to get better or if they get worse. Your condition will be monitored carefully while you are receiving this medicine. Do not become pregnant while taking this medicine or for 7 months after stopping it. Women should inform their healthcare professional if they wish to become pregnant or think they might be pregnant. Men should not father a child while taking this medicine and for 4 months after stopping it. There is potential for serious side effects to an unborn child. Talk to your healthcare professional for more information. Do not breast-feed an infant while taking this medicine or for 7 months after the last dose. This medicine has caused decreased sperm counts in some men. This may make it more difficult to father a child. Talk to your healthcare professional if you are concerned about your fertility. This medicine may increase your risk to bruise or bleed. Call your health care professional if you notice any unusual bleeding. Be careful brushing or flossing your teeth or using a toothpick because you may get an infection or bleed more easily. If you have any dental work done, tell your dentist you are receiving this medicine. This medicine may cause dry eyes [and blurred vision]. If you wear contact lenses, you may feel some discomfort. Lubricating eye drops may help. See your healthcare professional if the problem does not go away or is severe. Call your healthcare professional for advice if you get a fever, chills, or sore throat, or other symptoms of a cold or flu. Do not treat yourself. This medicine decreases your body's ability to fight infections. Try to  avoid being around people who are sick. Avoid taking medicines that contain aspirin, acetaminophen, ibuprofen, naproxen, or ketoprofen unless instructed by your healthcare professional. These medicines may hide a fever. What side effects may I notice from receiving this medicine? Side  effects that you should report to your doctor or health care professional as soon as possible:  allergic reactions like skin rash, itching or hives, swelling of the face, lips, or tongue  breathing problems  cough  nausea, vomiting  signs and symptoms of bleeding such as bloody or black, tarry stools; red or dark-brown urine; spitting up blood or brown material that looks like coffee grounds; red spots on the skin; unusual bruising or bleeding from the eye, gums, or nose  signs and symptoms of heart failure like breathing problems, fast, irregular heartbeat, sudden weight gain; swelling of the ankles, feet, hands; unusually weak or tired  signs and symptoms of infection like fever; chills; cough; sore throat; pain or trouble passing urine  signs and symptoms of low red blood cells or anemia such as unusually weak or tired; feeling faint or lightheaded; falls; breathing problems Side effects that usually do not require medical attention (report these to your doctor or health care professional if they continue or are bothersome):  constipation  diarrhea  dry eyes  hair loss  loss of appetite  mouth sores  rash This list may not describe all possible side effects. Call your doctor for medical advice about side effects. You may report side effects to FDA at 1-800-FDA-1088. Where should I keep my medicine? This drug is given in a hospital or clinic and will not be stored at home. NOTE: This sheet is a summary. It may not cover all possible information. If you have questions about this medicine, talk to your doctor, pharmacist, or health care provider.  2020 Elsevier/Gold Standard (2018-05-12 15:07:27) Pt discharged in no apparent distress. Pt left ambulatory without assistance. Pt aware of discharge instructions and verbalized understanding and had no further questions.

## 2020-03-27 NOTE — Progress Notes (Signed)
Hematology and Oncology Follow Up Visit  Matthew Reynolds [id number] April 23, 1965 55 y.o. 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 Both he and his daughter had Covid over Christmas but thankfully their symptoms were very mild and both have recuperated nicely.  His taste and smell have returned. He has maintained a good appetite and is staying well hydrated. His weight is stable at 155 lbs.  He has had a mild dry cough but states he has not coughed so far today.  No fever, chills, n/v, rash, dizziness, SOB, chest pain, palpitations, abdominal pain or changes in bowel or bladder habits.  No swelling or tenderness in his extremities. 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  No falls or syncope to report.  No episodes of bleeding. No abnormal bruising, no petechiae.   ECOG Performance Status: 1 - Symptomatic but completely ambulatory  Medications:  Allergies as of 03/27/2020   No Known Allergies     Medication List       Accurate as of March 27, 2020  9:23 AM. If you have any questions, ask your nurse or doctor.        Dexilant 60 MG capsule Generic drug: dexlansoprazole Take 1 capsule (60 mg total) by mouth daily.   Eysuvis 0.25 % Susp Generic drug: Loteprednol Etabonate Instill 1 drop into both eyes twice a day   lidocaine-prilocaine cream Commonly known as:  EMLA Apply 1 application topically as needed.   MULTI-VITAMIN DAILY PO Take by mouth.   prochlorperazine 10 MG tablet Commonly known as: COMPAZINE Take 1 tablet (10 mg total) by mouth every 6 (six) hours as needed (Nausea or vomiting).   sucralfate 1 GM/10ML suspension Commonly known as: CARAFATE Take 10 mLs (1 g total) by mouth 3 (three) times daily.       Allergies: No Known Allergies  Past Medical History, Surgical history, Social history, and Family History were reviewed and updated.  Review of Systems: All other 10 point review of systems is negative.   Physical Exam:  height is _0  (1.753 m) and weight is 155 lb (70.3 kg). His oral temperature is 98.5 F (36.9 C). His blood pressure is 110/76 and his pulse is 108 (abnormal). His respiration is 18 and oxygen saturation is 98%.   Wt Readings from Last 3 Encounters:  03/27/20 155 lb (70.3 kg)  02/15/20 154 lb 12.8 oz (70.2 kg)  01/25/20 153 lb (69.4 kg)    Ocular: Sclerae unicteric, pupils equal, round and reactive to light Ear-nose-throat: Oropharynx clear, dentition fair Lymphatic: No cervical or supraclavicular adenopathy Lungs no rales or rhonchi, good excursion bilaterally Heart regular rate and rhythm, no murmur appreciated Abd soft, nontender, positive bowel sounds MSK no focal spinal tenderness, no joint edema Neuro: non-focal, well-oriented, appropriate affect Breasts: Deferred   Lab Results  Component Value Date   WBC 5.0 03/27/2020  HGB 14.2 03/27/2020   HCT 41.5 03/27/2020   MCV 93.9 03/27/2020   PLT 142 (L) 03/27/2020   Lab Results  Component Value Date   FERRITIN 222 12/13/2019   IRON 55 12/13/2019   TIBC 289 12/13/2019   UIBC 234 12/13/2019   IRONPCTSAT 19 (L) 12/13/2019   Lab Results  Component Value Date   RBC 4.42 03/27/2020   No results found for: KPAFRELGTCHN, LAMBDASER, KAPLAMBRATIO No results found for: IGGSERUM, IGA, IGMSERUM No results found for: Odetta Pink, SPEI   Chemistry      Component Value Date/Time   NA 142 03/27/2020 0849   K 3.8 03/27/2020 0849   CL 106 03/27/2020 0849   CO2 28 03/27/2020 0849   BUN 11 03/27/2020 0849   CREATININE 0.85 03/27/2020 0849      Component Value Date/Time   CALCIUM 9.6 03/27/2020 0849   ALKPHOS 131 (H) 03/27/2020 0849   AST 27 03/27/2020 0849   ALT 19 03/27/2020 0849   BILITOT 0.5 03/27/2020 0849       [name] is a very pleasant 55yo caucasian gentleman with metastatic adenocarcinoma of the GE junction, HER-2 positive, PD-L1 positive. Thankfully he has done well recuperating from Covid.  We will proceed with treatment today as planned.  We will plan to repeat his PET scan in March 2022.  Follow-up in 4 weeks.  He was encouraged to contact our office with any questions or concerns.   Laverna Peace, NP 1/10/20229:23 AM

## 2020-03-27 NOTE — Telephone Encounter (Signed)
Appointments were already scheduled as requested by 1/10 los

## 2020-04-04 ENCOUNTER — Other Ambulatory Visit: Payer: Self-pay

## 2020-04-04 ENCOUNTER — Ambulatory Visit: Payer: Self-pay

## 2020-04-04 ENCOUNTER — Ambulatory Visit: Payer: Self-pay | Admitting: Family

## 2020-04-11 ENCOUNTER — Ambulatory Visit: Payer: 59 | Admitting: Hematology & Oncology

## 2020-04-11 ENCOUNTER — Inpatient Hospital Stay: Payer: BC Managed Care – PPO

## 2020-04-11 ENCOUNTER — Ambulatory Visit: Payer: 59

## 2020-04-11 ENCOUNTER — Other Ambulatory Visit: Payer: 59

## 2020-04-18 ENCOUNTER — Ambulatory Visit: Payer: Self-pay

## 2020-04-18 ENCOUNTER — Other Ambulatory Visit: Payer: Self-pay

## 2020-04-18 ENCOUNTER — Ambulatory Visit: Payer: Self-pay | Admitting: Hematology & Oncology

## 2020-04-25 ENCOUNTER — Inpatient Hospital Stay: Payer: BC Managed Care – PPO

## 2020-04-25 ENCOUNTER — Inpatient Hospital Stay: Payer: BC Managed Care – PPO | Admitting: Family

## 2020-04-25 ENCOUNTER — Inpatient Hospital Stay: Payer: BC Managed Care – PPO | Attending: Hematology & Oncology

## 2020-04-25 ENCOUNTER — Other Ambulatory Visit: Payer: Self-pay

## 2020-04-25 ENCOUNTER — Encounter: Payer: Self-pay | Admitting: Family

## 2020-04-25 VITALS — BP 137/89 | HR 109 | Temp 99.2°F | Resp 18 | Ht 69.0 in | Wt 155.0 lb

## 2020-04-25 DIAGNOSIS — G629 Polyneuropathy, unspecified: Secondary | ICD-10-CM | POA: Diagnosis not present

## 2020-04-25 DIAGNOSIS — C799 Secondary malignant neoplasm of unspecified site: Secondary | ICD-10-CM | POA: Diagnosis not present

## 2020-04-25 DIAGNOSIS — Z79899 Other long term (current) drug therapy: Secondary | ICD-10-CM | POA: Insufficient documentation

## 2020-04-25 DIAGNOSIS — Z7189 Other specified counseling: Secondary | ICD-10-CM

## 2020-04-25 DIAGNOSIS — C787 Secondary malignant neoplasm of liver and intrahepatic bile duct: Secondary | ICD-10-CM

## 2020-04-25 DIAGNOSIS — Z5112 Encounter for antineoplastic immunotherapy: Secondary | ICD-10-CM | POA: Insufficient documentation

## 2020-04-25 DIAGNOSIS — C160 Malignant neoplasm of cardia: Secondary | ICD-10-CM

## 2020-04-25 DIAGNOSIS — M21372 Foot drop, left foot: Secondary | ICD-10-CM | POA: Diagnosis not present

## 2020-04-25 LAB — LACTATE DEHYDROGENASE: LDH: 114 U/L (ref 98–192)

## 2020-04-25 LAB — CBC WITH DIFFERENTIAL (CANCER CENTER ONLY)
Abs Immature Granulocytes: 0.01 10*3/uL (ref 0.00–0.07)
Basophils Absolute: 0 10*3/uL (ref 0.0–0.1)
Basophils Relative: 1 %
Eosinophils Absolute: 0.1 10*3/uL (ref 0.0–0.5)
Eosinophils Relative: 1 %
HCT: 40.5 % (ref 39.0–52.0)
Hemoglobin: 13.7 g/dL (ref 13.0–17.0)
Immature Granulocytes: 0 %
Lymphocytes Relative: 24 %
Lymphs Abs: 1 10*3/uL (ref 0.7–4.0)
MCH: 31.2 pg (ref 26.0–34.0)
MCHC: 33.8 g/dL (ref 30.0–36.0)
MCV: 92.3 fL (ref 80.0–100.0)
Monocytes Absolute: 0.5 10*3/uL (ref 0.1–1.0)
Monocytes Relative: 12 %
Neutro Abs: 2.6 10*3/uL (ref 1.7–7.7)
Neutrophils Relative %: 62 %
Platelet Count: 129 10*3/uL — ABNORMAL LOW (ref 150–400)
RBC: 4.39 MIL/uL (ref 4.22–5.81)
RDW: 13.9 % (ref 11.5–15.5)
WBC Count: 4.2 10*3/uL (ref 4.0–10.5)
nRBC: 0 % (ref 0.0–0.2)

## 2020-04-25 LAB — CMP (CANCER CENTER ONLY)
ALT: 23 U/L (ref 0–44)
AST: 29 U/L (ref 15–41)
Albumin: 3.7 g/dL (ref 3.5–5.0)
Alkaline Phosphatase: 151 U/L — ABNORMAL HIGH (ref 38–126)
Anion gap: 7 (ref 5–15)
BUN: 9 mg/dL (ref 6–20)
CO2: 28 mmol/L (ref 22–32)
Calcium: 9.3 mg/dL (ref 8.9–10.3)
Chloride: 104 mmol/L (ref 98–111)
Creatinine: 0.87 mg/dL (ref 0.61–1.24)
GFR, Estimated: >60 mL/min (ref >60)
Glucose, Bld: 114 mg/dL — ABNORMAL HIGH (ref 70–99)
Potassium: 3.6 mmol/L (ref 3.5–5.1)
Sodium: 139 mmol/L (ref 135–145)
Total Bilirubin: 0.6 mg/dL (ref 0.3–1.2)
Total Protein: 6.1 g/dL — ABNORMAL LOW (ref 6.5–8.1)

## 2020-04-25 MED ORDER — DIPHENHYDRAMINE HCL 25 MG PO CAPS
ORAL_CAPSULE | ORAL | Status: AC
  Filled 2020-04-25: qty 1

## 2020-04-25 MED ORDER — PALONOSETRON HCL INJECTION 0.25 MG/5ML
0.2500 mg | Freq: Once | INTRAVENOUS | Status: AC
  Administered 2020-04-25: 0.25 mg via INTRAVENOUS

## 2020-04-25 MED ORDER — DIPHENHYDRAMINE HCL 25 MG PO CAPS
50.0000 mg | ORAL_CAPSULE | Freq: Once | ORAL | Status: AC
  Administered 2020-04-25: 25 mg via ORAL

## 2020-04-25 MED ORDER — PALONOSETRON HCL INJECTION 0.25 MG/5ML
INTRAVENOUS | Status: AC
  Filled 2020-04-25: qty 5

## 2020-04-25 MED ORDER — ACETAMINOPHEN 325 MG PO TABS
650.0000 mg | ORAL_TABLET | Freq: Once | ORAL | Status: AC
Start: 2020-04-25 — End: 2020-04-25
  Administered 2020-04-25: 650 mg via ORAL

## 2020-04-25 MED ORDER — ACETAMINOPHEN 325 MG PO TABS
ORAL_TABLET | ORAL | Status: AC
  Filled 2020-04-25: qty 2

## 2020-04-25 MED ORDER — HEPARIN SOD (PORK) LOCK FLUSH 100 UNIT/ML IV SOLN
500.0000 [IU] | Freq: Once | INTRAVENOUS | Status: AC | PRN
  Administered 2020-04-25: 500 [IU]
  Filled 2020-04-25: qty 5

## 2020-04-25 MED ORDER — DEXTROSE 5 % IV SOLN
Freq: Once | INTRAVENOUS | Status: AC
  Filled 2020-04-25: qty 250

## 2020-04-25 MED ORDER — FAM-TRASTUZUMAB DERUXTECAN-NXKI CHEMO 100 MG IV SOLR
450.0000 mg | Freq: Once | INTRAVENOUS | Status: AC
  Administered 2020-04-25: 450 mg via INTRAVENOUS
  Filled 2020-04-25: qty 22.5

## 2020-04-25 MED ORDER — DEXAMETHASONE SODIUM PHOSPHATE 100 MG/10ML IJ SOLN
10.0000 mg | Freq: Once | INTRAMUSCULAR | Status: AC
  Administered 2020-04-25: 10 mg via INTRAVENOUS
  Filled 2020-04-25: qty 10

## 2020-04-25 MED ORDER — SODIUM CHLORIDE 0.9% FLUSH
10.0000 mL | INTRAVENOUS | Status: DC | PRN
  Administered 2020-04-25: 10 mL
  Filled 2020-04-25: qty 10

## 2020-04-25 NOTE — Patient Instructions (Signed)

## 2020-04-25 NOTE — Patient Instructions (Signed)
Fam-Trastuzumab deruxtecan injection What is this medicine? TRASTUZUMAB DERUXTECAN (tras TOOZ eu mab DER ux TEE kan) is a monoclonal antibody combined with chemotherapy. It is used to treat breast cancer. This medicine may be used for other purposes; ask your health care provider or pharmacist if you have questions. COMMON BRAND NAME(S): ENHERTU What should I tell my health care provider before I take this medicine? They need to know if you have any of these conditions:  heart disease  heart failure  infection (especially a virus infection such as chickenpox, cold sores, or herpes)  liver disease  lung or breathing disease, like asthma  an unusual or allergic reaction to fam-trastuzumab deruxtecan, other medications, foods, dyes, or preservatives  pregnant or trying to get pregnant  breast-feeding How should I use this medicine? This medicine is for infusion into a vein. It is given by a health care professional in a hospital or clinic setting. Talk to your pediatrician regarding the use of this medicine in children. Special care may be needed. Overdosage: If you think you have taken too much of this medicine contact a poison control center or emergency room at once. NOTE: This medicine is only for you. Do not share this medicine with others. What if I miss a dose? It is important not to miss your dose. Call your doctor or health care professional if you are unable to keep an appointment. What may interact with this medicine? Interaction studies have not been performed. This list may not describe all possible interactions. Give your health care provider a list of all the medicines, herbs, non-prescription drugs, or dietary supplements you use. Also tell them if you smoke, drink alcohol, or use illegal drugs. Some items may interact with your medicine. What should I watch for while using this medicine? Visit your healthcare professional for regular checks on your progress. Tell your  healthcare professional if your symptoms do not start to get better or if they get worse. Your condition will be monitored carefully while you are receiving this medicine. Do not become pregnant while taking this medicine or for 7 months after stopping it. Women should inform their healthcare professional if they wish to become pregnant or think they might be pregnant. Men should not father a child while taking this medicine and for 4 months after stopping it. There is potential for serious side effects to an unborn child. Talk to your healthcare professional for more information. Do not breast-feed an infant while taking this medicine or for 7 months after the last dose. This medicine has caused decreased sperm counts in some men. This may make it more difficult to father a child. Talk to your healthcare professional if you are concerned about your fertility. This medicine may increase your risk to bruise or bleed. Call your health care professional if you notice any unusual bleeding. Be careful brushing or flossing your teeth or using a toothpick because you may get an infection or bleed more easily. If you have any dental work done, tell your dentist you are receiving this medicine. This medicine may cause dry eyes [and blurred vision]. If you wear contact lenses, you may feel some discomfort. Lubricating eye drops may help. See your healthcare professional if the problem does not go away or is severe. Call your healthcare professional for advice if you get a fever, chills, or sore throat, or other symptoms of a cold or flu. Do not treat yourself. This medicine decreases your body's ability to fight infections. Try to  avoid being around people who are sick. Avoid taking medicines that contain aspirin, acetaminophen, ibuprofen, naproxen, or ketoprofen unless instructed by your healthcare professional. These medicines may hide a fever. What side effects may I notice from receiving this medicine? Side  effects that you should report to your doctor or health care professional as soon as possible:  allergic reactions like skin rash, itching or hives, swelling of the face, lips, or tongue  breathing problems  cough  nausea, vomiting  signs and symptoms of bleeding such as bloody or black, tarry stools; red or dark-brown urine; spitting up blood or brown material that looks like coffee grounds; red spots on the skin; unusual bruising or bleeding from the eye, gums, or nose  signs and symptoms of heart failure like breathing problems, fast, irregular heartbeat, sudden weight gain; swelling of the ankles, feet, hands; unusually weak or tired  signs and symptoms of infection like fever; chills; cough; sore throat; pain or trouble passing urine  signs and symptoms of low red blood cells or anemia such as unusually weak or tired; feeling faint or lightheaded; falls; breathing problems Side effects that usually do not require medical attention (report these to your doctor or health care professional if they continue or are bothersome):  constipation  diarrhea  dry eyes  hair loss  loss of appetite  mouth sores  rash This list may not describe all possible side effects. Call your doctor for medical advice about side effects. You may report side effects to FDA at 1-800-FDA-1088. Where should I keep my medicine? This drug is given in a hospital or clinic and will not be stored at home. NOTE: This sheet is a summary. It may not cover all possible information. If you have questions about this medicine, talk to your doctor, pharmacist, or health care provider.  2020 Elsevier/Gold Standard (2018-05-12 15:07:27) Pt discharged in no apparent distress. Pt left ambulatory without assistance. Pt aware of discharge instructions and verbalized understanding and had no further questions.

## 2020-04-25 NOTE — Progress Notes (Signed)
Hematology and Oncology Follow Up Visit  Matthew Reynolds [id number] January 19, 1966 55 y.o. 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 He is doing well and enjoying spending time with his sweet family. They were able to see Matthew Reynolds over the weekend and really enjoyed the show.  No fever, chills, n/v, cough, rash, dizziness, SOB, chest pain, palpitations, abdominal pain or changes in bowel or bladder habits.  No swelling in his extremities.  The neuropathy in his hands and feet is unchanged. He has foot drop in the left foot but is getting around nicely.  He exercises regularly to improve strength. No falls or syncope to report.  He has maintained a good appetite but since radiation has to eat slower with smaller bites. This can be frustrating at times. He is staying well hydrated throughout the day.   ECOG Performance Status: 1 - Symptomatic but completely ambulatory  Medications:  Allergies as of 04/25/2020   No Known Allergies     Medication List       Accurate as of April 25, 2020 10:07 AM. If you have any questions, ask your nurse or doctor.        Dexilant 60 MG capsule Generic drug: dexlansoprazole Take 1 capsule (60 mg total) by mouth daily.   Eysuvis 0.25 % Susp Generic drug: Loteprednol Etabonate Instill 1 drop into both eyes twice a day   lidocaine-prilocaine cream Commonly known as:  EMLA Apply 1 application topically as needed.   MULTI-VITAMIN DAILY PO Take by mouth.   prochlorperazine 10 MG tablet Commonly known as: COMPAZINE Take 1 tablet (10 mg total) by mouth every 6 (six) hours as needed (Nausea or vomiting).   sucralfate 1 GM/10ML suspension Commonly known as: CARAFATE Take 10 mLs (1 g total) by mouth 3 (three) times daily.       Allergies: No Known Allergies  Past Medical History, Surgical history, Social history, and Family History were reviewed and updated.  Review of Systems: All other 10 point review of systems is negative.   Physical Exam:  height is _0  (1.753 m) and weight is 155 lb (70.3 kg). His oral temperature is 99.2 F (37.3 C). His blood pressure is 137/89 and his pulse is 109 (abnormal). His respiration is 18 and oxygen saturation is 100%.   Wt Readings from Last 3 Encounters:  04/25/20 155 lb (70.3 kg)  03/27/20 155 lb (70.3 kg)  02/15/20 154 lb 12.8 oz (70.2 kg)    Ocular: Sclerae unicteric, pupils equal, round and reactive to light Ear-nose-throat: Oropharynx clear, dentition fair Lymphatic: No cervical or supraclavicular adenopathy Lungs no rales or rhonchi, good excursion bilaterally Heart regular rate and rhythm, no murmur appreciated Abd soft, nontender, positive bowel sounds MSK no focal spinal tenderness, no joint edema Neuro: non-focal, well-oriented, appropriate affect Breasts: Deferred   Lab Results  Component Value Date   WBC 4.2 04/25/2020  HGB 13.7 04/25/2020   HCT 40.5 04/25/2020   MCV 92.3 04/25/2020   PLT 129 (L) 04/25/2020   Lab Results  Component Value Date   FERRITIN 222 12/13/2019   IRON 55 12/13/2019   TIBC 289 12/13/2019   UIBC 234 12/13/2019   IRONPCTSAT 19 (L) 12/13/2019   Lab Results  Component Value Date   RBC 4.39 04/25/2020   No results found for: KPAFRELGTCHN, LAMBDASER, KAPLAMBRATIO No results found for: IGGSERUM, IGA, IGMSERUM No results found for: Matthew Reynolds, SPEI   Chemistry      Component Value Date/Time   NA 139 04/25/2020 0922   K 3.6 04/25/2020 0922   CL 104 04/25/2020 0922   CO2 28 04/25/2020 0922   BUN 9 04/25/2020 0922   CREATININE 0.87 04/25/2020 0922      Component Value Date/Time   CALCIUM 9.3 04/25/2020 0922   ALKPHOS 151 (H) 04/25/2020 0922   AST 29 04/25/2020 0922   ALT 23 04/25/2020 0922   BILITOT 0.6 04/25/2020 5621       [name] is a very pleasant 55yo caucasian gentleman with metastatic adenocarcinoma of the GE junction, HER-2 positive, PD-L1 positive. We will proceed with treatment today as planned.  PET scan in 3 weeks and follow-up in 4 weeks with MD.  He was encouraged to contact our office with any questions or concerns.  We can certainly see him sooner if needed.   Laverna Peace, NP 2/8/202210:07 AM

## 2020-05-08 ENCOUNTER — Telehealth: Payer: Self-pay

## 2020-05-08 ENCOUNTER — Encounter: Payer: Self-pay | Admitting: Hematology & Oncology

## 2020-05-08 NOTE — Telephone Encounter (Signed)
Cancel 05/16/20 PET per sch message, lm for Nuc med 05/08/20    Lenda Baratta

## 2020-05-09 ENCOUNTER — Other Ambulatory Visit: Payer: 59

## 2020-05-09 ENCOUNTER — Ambulatory Visit: Payer: 59

## 2020-05-09 ENCOUNTER — Ambulatory Visit: Payer: 59 | Admitting: Family

## 2020-05-09 ENCOUNTER — Other Ambulatory Visit: Payer: Self-pay | Admitting: Family

## 2020-05-09 DIAGNOSIS — C160 Malignant neoplasm of cardia: Secondary | ICD-10-CM

## 2020-05-16 ENCOUNTER — Encounter (HOSPITAL_BASED_OUTPATIENT_CLINIC_OR_DEPARTMENT_OTHER): Payer: Self-pay

## 2020-05-16 ENCOUNTER — Ambulatory Visit (HOSPITAL_COMMUNITY): Payer: BC Managed Care – PPO

## 2020-05-16 ENCOUNTER — Other Ambulatory Visit: Payer: Self-pay

## 2020-05-16 ENCOUNTER — Emergency Department (HOSPITAL_BASED_OUTPATIENT_CLINIC_OR_DEPARTMENT_OTHER): Payer: BC Managed Care – PPO

## 2020-05-16 ENCOUNTER — Encounter (HOSPITAL_BASED_OUTPATIENT_CLINIC_OR_DEPARTMENT_OTHER): Payer: Self-pay | Admitting: Emergency Medicine

## 2020-05-16 ENCOUNTER — Observation Stay (HOSPITAL_BASED_OUTPATIENT_CLINIC_OR_DEPARTMENT_OTHER)
Admission: EM | Admit: 2020-05-16 | Discharge: 2020-05-18 | Disposition: A | Discharge status: Home / Self Care | Payer: BC Managed Care – PPO | Attending: Internal Medicine | Admitting: Internal Medicine

## 2020-05-16 ENCOUNTER — Ambulatory Visit (HOSPITAL_BASED_OUTPATIENT_CLINIC_OR_DEPARTMENT_OTHER)
Admission: RE | Admit: 2020-05-16 | Discharge: 2020-05-16 | Disposition: A | Discharge status: Home / Self Care | Payer: BC Managed Care – PPO | Source: Ambulatory Visit | Attending: Family | Admitting: Family

## 2020-05-16 ENCOUNTER — Other Ambulatory Visit: Payer: Self-pay | Admitting: Family

## 2020-05-16 DIAGNOSIS — I313 Pericardial effusion (noninflammatory): Secondary | ICD-10-CM | POA: Insufficient documentation

## 2020-05-16 DIAGNOSIS — Z9221 Personal history of antineoplastic chemotherapy: Secondary | ICD-10-CM | POA: Diagnosis not present

## 2020-05-16 DIAGNOSIS — D500 Iron deficiency anemia secondary to blood loss (chronic): Secondary | ICD-10-CM | POA: Diagnosis not present

## 2020-05-16 DIAGNOSIS — R079 Chest pain, unspecified: Secondary | ICD-10-CM | POA: Diagnosis not present

## 2020-05-16 DIAGNOSIS — Z79899 Other long term (current) drug therapy: Secondary | ICD-10-CM | POA: Diagnosis not present

## 2020-05-16 DIAGNOSIS — J90 Pleural effusion, not elsewhere classified: Secondary | ICD-10-CM | POA: Insufficient documentation

## 2020-05-16 DIAGNOSIS — M4316 Spondylolisthesis, lumbar region: Secondary | ICD-10-CM | POA: Diagnosis not present

## 2020-05-16 DIAGNOSIS — R188 Other ascites: Secondary | ICD-10-CM | POA: Diagnosis not present

## 2020-05-16 DIAGNOSIS — Z20822 Contact with and (suspected) exposure to covid-19: Secondary | ICD-10-CM | POA: Insufficient documentation

## 2020-05-16 DIAGNOSIS — K7689 Other specified diseases of liver: Secondary | ICD-10-CM | POA: Diagnosis not present

## 2020-05-16 DIAGNOSIS — C160 Malignant neoplasm of cardia: Secondary | ICD-10-CM | POA: Diagnosis present

## 2020-05-16 DIAGNOSIS — K7469 Other cirrhosis of liver: Secondary | ICD-10-CM | POA: Diagnosis not present

## 2020-05-16 DIAGNOSIS — R9431 Abnormal electrocardiogram [ECG] [EKG]: Secondary | ICD-10-CM | POA: Diagnosis not present

## 2020-05-16 DIAGNOSIS — C787 Secondary malignant neoplasm of liver and intrahepatic bile duct: Secondary | ICD-10-CM | POA: Insufficient documentation

## 2020-05-16 DIAGNOSIS — C155 Malignant neoplasm of lower third of esophagus: Secondary | ICD-10-CM | POA: Diagnosis not present

## 2020-05-16 DIAGNOSIS — I3139 Other pericardial effusion (noninflammatory): Secondary | ICD-10-CM | POA: Diagnosis present

## 2020-05-16 DIAGNOSIS — R0789 Other chest pain: Secondary | ICD-10-CM | POA: Diagnosis not present

## 2020-05-16 DIAGNOSIS — J9811 Atelectasis: Secondary | ICD-10-CM | POA: Diagnosis not present

## 2020-05-16 LAB — TROPONIN I (HIGH SENSITIVITY)
Troponin I (High Sensitivity): 3 ng/L
Troponin I (High Sensitivity): 4 ng/L

## 2020-05-16 LAB — RESP PANEL BY RT-PCR (FLU A&B, COVID) ARPGX2
Influenza A by PCR: NEGATIVE
Influenza B by PCR: NEGATIVE
SARS Coronavirus 2 by RT PCR: NEGATIVE

## 2020-05-16 LAB — CBC
HCT: 41.1 % (ref 39.0–52.0)
Hemoglobin: 13.9 g/dL (ref 13.0–17.0)
MCH: 31.7 pg (ref 26.0–34.0)
MCHC: 33.8 g/dL (ref 30.0–36.0)
MCV: 93.6 fL (ref 80.0–100.0)
Platelets: 166 10*3/uL (ref 150–400)
RBC: 4.39 MIL/uL (ref 4.22–5.81)
RDW: 14.5 % (ref 11.5–15.5)
WBC: 3.8 10*3/uL — ABNORMAL LOW (ref 4.0–10.5)
nRBC: 0 % (ref 0.0–0.2)

## 2020-05-16 LAB — PROTIME-INR
INR: 1.1 (ref 0.8–1.2)
Prothrombin Time: 13.6 seconds (ref 11.4–15.2)

## 2020-05-16 LAB — BASIC METABOLIC PANEL WITH GFR
Anion gap: 8 (ref 5–15)
BUN: 10 mg/dL (ref 6–20)
CO2: 27 mmol/L (ref 22–32)
Calcium: 8.4 mg/dL — ABNORMAL LOW (ref 8.9–10.3)
Chloride: 99 mmol/L (ref 98–111)
Creatinine, Ser: 0.8 mg/dL (ref 0.61–1.24)
GFR, Estimated: >60 mL/min
Glucose, Bld: 114 mg/dL — ABNORMAL HIGH (ref 70–99)
Potassium: 3.4 mmol/L — ABNORMAL LOW (ref 3.5–5.1)
Sodium: 134 mmol/L — ABNORMAL LOW (ref 135–145)

## 2020-05-16 LAB — BRAIN NATRIURETIC PEPTIDE: B Natriuretic Peptide: 27.6 pg/mL (ref 0.0–100.0)

## 2020-05-16 MED ORDER — FUROSEMIDE 10 MG/ML IJ SOLN
40.0000 mg | Freq: Once | INTRAMUSCULAR | Status: AC
  Administered 2020-05-16: 40 mg via INTRAVENOUS
  Filled 2020-05-16: qty 4

## 2020-05-16 MED ORDER — IOHEXOL 300 MG/ML  SOLN
100.0000 mL | Freq: Once | INTRAMUSCULAR | Status: AC | PRN
  Administered 2020-05-16: 100 mL via INTRAVENOUS

## 2020-05-16 NOTE — ED Notes (Signed)
Phone Handoff Report provided to Rec RN (Tai-RN) at [facility]

## 2020-05-16 NOTE — Progress Notes (Signed)
I spoke with patient regarding his CT scans today which showed new small pericardial effusion, bilateral moderate pulmonary effusions and moderate intraperitoneal free fluid along with pericolic gutters into the pelvis. He is symptomatic with chest tightness and abdominal discomfort.  Patient is agreeable to go to the ED for further work up and treatment. He would like to come to [facility] ED. Report called to Dr. Laverta Baltimore. No other questions or concerns at this time.

## 2020-05-16 NOTE — ED Provider Notes (Signed)
Emergency Department Provider Note   I have reviewed the triage vital signs and the nursing notes.   HISTORY  Chief Complaint Chest Pain   HPI Matthew Reynolds is a 55 y.o. male with past medical history of adenocarcinoma of the esophagus currently followed by the common oncology service presents with abnormal outpatient CT imaging of the chest, abdomen, pelvis.  This was routine imaging which showed a small pericardial effusion with moderate bilateral pleural effusions and some ascites.  Does note some very mild tightness in the chest which is fairly constant.  He did not even think to mention the symptoms as they were so mild.  He was walking and doing an exercise class yesterday with no difficulty.  He is not had swelling in the legs.  He denies fevers or chills.  No passing out or near syncope.  When results came back today he was called and directed to the emergency department.    Past Medical History:  Diagnosis Date  . Adenocarcinoma of cardio-esophageal junction ([place]) 12/10/2017  . Goals of care, counseling/discussion 12/10/2017  . Iron deficiency anemia due to chronic blood loss 12/11/2017  . 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 Pericardial effusion 05/16/2020  . Iron deficiency anemia due to chronic blood loss 12/11/2017  . Adenocarcinoma of cardio-esophageal junction ([place]) 12/10/2017  . 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 Goals of care, counseling/discussion 12/10/2017  . 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 CYST EXCISION Left 01/15/2016   Procedure: EXCISION SEBACEOUS CYST LEFT UPPER BACK;  Surgeon: Georganna Skeans, MD;  Location: [place];  Service: General;  Laterality: Left;  . IR IMAGING GUIDED  PORT INSERTION  12/15/2017    Allergies Patient has no known allergies.  No family history on file.  Social History Social History   Tobacco Use  . Smoking status: Never Smoker  . Smokeless tobacco: Never Used  Vaping Use  . Vaping Use: Never used  Substance Use Topics  . Alcohol use: Yes    Comment: weekly  . Drug use: No    Review of Systems  Constitutional: No fever/chills Eyes: No visual changes. ENT: No sore throat. Cardiovascular: Positive mild chest pain. Respiratory: Denies shortness of breath. Gastrointestinal: No abdominal pain.  No nausea, no vomiting.  No diarrhea.  No constipation. Genitourinary: Negative for dysuria. Musculoskeletal: Negative for back pain. Skin: Negative for rash. Neurological: Negative for headaches, focal weakness or numbness.  10-point ROS otherwise negative.  ____________________________________________   PHYSICAL EXAM:  VITAL SIGNS: ED Triage Vitals  Enc Vitals Group     BP 05/16/20 1710 (!) 138/97     Pulse Rate 05/16/20 1710 85     Resp 05/16/20 1710 20     Temp 05/16/20 1710 97.7 F (36.5 C)     Temp Source 05/16/20 1710 Oral     SpO2 05/16/20 1710 99 %     Weight 05/16/20 1710 145 lb (65.8 kg)     Height 05/16/20 1710 5\' 9"  (1.753 m)   Constitutional: Alert and oriented. Well appearing and in no acute distress. Eyes: Conjunctivae are normal.  Head: Atraumatic. Nose: No congestion/rhinnorhea. Mouth/Throat: Mucous membranes are moist. Neck: No stridor.   Cardiovascular: Normal rate, regular rhythm. Good peripheral circulation. Grossly normal heart sounds.  Respiratory: Normal respiratory effort.  No retractions. Lungs diminished at the bases.  Gastrointestinal: Soft and nontender. No distention.  Musculoskeletal: No lower extremity tenderness nor edema. No gross deformities of extremities. Neurologic:  Normal speech and language. No gross focal neurologic deficits are appreciated.  Skin:  Skin is warm, dry and  intact. No rash noted.   ____________________________________________   LABS (all labs ordered are listed, but only abnormal results are displayed)  Labs Reviewed  BASIC METABOLIC PANEL - Abnormal; Notable for the following components:      Result Value   Sodium 134 (*)    Potassium 3.4 (*)    Glucose, Bld 114 (*)    Calcium 8.4 (*)    All other components within normal limits  CBC - Abnormal; Notable for the following components:   WBC 3.8 (*)    All other components within normal limits  RESP PANEL BY RT-PCR (FLU A&B, COVID) ARPGX2  BRAIN NATRIURETIC PEPTIDE  PROTIME-INR  TROPONIN I (HIGH SENSITIVITY)  TROPONIN I (HIGH SENSITIVITY)   ____________________________________________  EKG  Rate: 86 PR: 140 QTc: 404  Sinus rhythm. Narrow QRS. No ST elevation/depression. No STEMI.  ____________________________________________  RADIOLOGY  DG Chest 2 View  Result Date: 05/16/2020 CLINICAL DATA:  Chest tightness x1 week. EXAM: CHEST - 2 VIEW COMPARISON:  None. FINDINGS: A right-sided venous Port-A-Cath is seen with its distal tip noted at the junction of the superior vena cava and right atrium. Moderate to marked severity atelectasis and/or infiltrate is seen within the left lung base. There is a very small right pleural effusion. A small left pleural effusion is also seen. No pneumothorax is identified. The heart size and mediastinal contours are within normal limits. The visualized skeletal structures are unremarkable. IMPRESSION: 1. Moderate to marked severity left basilar atelectasis and/or infiltrate. 2. Bilateral pleural effusions, left greater than right. Electronically Signed   By: Virgina Norfolk M.D.   On: 05/16/2020 17:38   CT Chest W Contrast  Result Date: 05/16/2020 CLINICAL DATA:  Gastrointestinal carcinoma surveillance. GE junction adenocarcinoma. Chemotherapy ongoing EXAM: CT CHEST, ABDOMEN, AND PELVIS WITH CONTRAST TECHNIQUE: Multidetector CT imaging of the chest,  abdomen and pelvis was performed following the standard protocol during bolus administration of intravenous contrast. CONTRAST:  171mL OMNIPAQUE IOHEXOL 300 MG/ML  SOLN COMPARISON:  PET-CT 01/27/2020, 08/25/2019 FINDINGS: CT CHEST FINDINGS Cardiovascular: Port in the anterior chest wall with tip in distal SVC. Small pericardial effusion is new from prior. Fusion is most prominent along the base the heart measuring 13 mm in depth (image 41/coronal series 5). Mediastinum/Nodes: Continued mild esophageal wall thickening through the distal esophagus up to the GE junction. No change from prior. No mass lesion or obstruction. No enlarged mediastinal lymph nodes. Lungs/Pleura: Bilateral moderate pleural effusions are new from prior. New bibasilar passive atelectasis. Musculoskeletal: No aggressive osseous lesion. CT ABDOMEN AND PELVIS FINDINGS Hepatobiliary: Liver has a fine nodular contour. No enhancing lesion. Gallbladder is collapsed. Moderate volume of intraperitoneal free fluid along the margin the RIGHT hepatic lobe. Portal veins are patent. Pancreas: Pancreas is normal. No ductal dilatation. No pancreatic inflammation. Spleen: Normal spleen Adrenals/urinary tract: Adrenal glands and kidneys are normal. The ureters and bladder normal. Stomach/Bowel: Stomach, small bowel, appendix, and cecum are normal. The colon and rectosigmoid colon are normal. Vascular/Lymphatic: Abdominal aorta is normal caliber. There is no retroperitoneal or periportal lymphadenopathy. No pelvic lymphadenopathy. Reproductive: Prostate normal Other: Moderate volume of intraperitoneal free fluid extends along the pericolic gutters into the pelvis. New from  prior Musculoskeletal: No aggressive osseous lesion. 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 IMPRESSION: Chest Impression: 1. New pericardial effusion and moderate bilateral pleural effusions. 2. No evidence of esophageal cancer recurrence. Stable thickening through the  distal esophagus. 3. No pulmonary metastasis or mediastinal adenopathy. Abdomen / Pelvis Impression: 1. No metastatic disease in the abdomen pelvis. 2. New intraperitoneal free fluid in the abdomen pelvis. 3. With new pericardial effusion, pleural effusions, and intraperitoneal free fluid query cardiac dysfunction or cardiac toxicity. These results will be called to the ordering clinician or representative by the Radiologist Assistant, and communication documented in the PACS or [employer]. Electronically Signed   By: Suzy Bouchard M.D.   On: 05/16/2020 16:10   CT Abdomen Pelvis W Contrast  Result Date: 05/16/2020 CLINICAL DATA:  Gastrointestinal carcinoma surveillance. GE junction adenocarcinoma. Chemotherapy ongoing EXAM: CT CHEST, ABDOMEN, AND PELVIS WITH CONTRAST TECHNIQUE: Multidetector CT imaging of the chest, abdomen and pelvis was performed following the standard protocol during bolus administration of intravenous contrast. CONTRAST:  144mL OMNIPAQUE IOHEXOL 300 MG/ML  SOLN COMPARISON:  PET-CT 01/27/2020, 08/25/2019 FINDINGS: CT CHEST FINDINGS Cardiovascular: Port in the anterior chest wall with tip in distal SVC. Small pericardial effusion is new from prior. Fusion is most prominent along the base the heart measuring 13 mm in depth (image 41/coronal series 5). Mediastinum/Nodes: Continued mild esophageal wall thickening through the distal esophagus up to the GE junction. No change from prior. No mass lesion or obstruction. No enlarged mediastinal lymph nodes. Lungs/Pleura: Bilateral moderate pleural effusions are new from prior. New bibasilar passive atelectasis. Musculoskeletal: No aggressive osseous lesion. CT ABDOMEN AND PELVIS FINDINGS Hepatobiliary: Liver has a fine nodular contour. No enhancing lesion. Gallbladder is collapsed. Moderate volume of intraperitoneal free fluid along the margin the RIGHT hepatic lobe. Portal veins are patent. Pancreas: Pancreas is normal. No ductal dilatation.  No pancreatic inflammation. Spleen: Normal spleen Adrenals/urinary tract: Adrenal glands and kidneys are normal. The ureters and bladder normal. Stomach/Bowel: Stomach, small bowel, appendix, and cecum are normal. The colon and rectosigmoid colon are normal. Vascular/Lymphatic: Abdominal aorta is normal caliber. There is no retroperitoneal or periportal lymphadenopathy. No pelvic lymphadenopathy. Reproductive: Prostate normal Other: Moderate volume of intraperitoneal free fluid extends along the pericolic gutters into the pelvis. New from prior Musculoskeletal: No aggressive osseous lesion. 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 IMPRESSION: Chest Impression: 1. New pericardial effusion and moderate bilateral pleural effusions. 2. No evidence of esophageal cancer recurrence. Stable thickening through the distal esophagus. 3. No pulmonary metastasis or mediastinal adenopathy. Abdomen / Pelvis Impression: 1. No metastatic disease in the abdomen pelvis. 2. New intraperitoneal free fluid in the abdomen pelvis. 3. With new pericardial effusion, pleural effusions, and intraperitoneal free fluid query cardiac dysfunction or cardiac toxicity. These results will be called to the ordering clinician or representative by the Radiologist Assistant, and communication documented in the PACS or [employer]. Electronically Signed   By: Suzy Bouchard M.D.   On: 05/16/2020 16:10    ____________________________________________   PROCEDURES  Procedure(s) performed:   Procedures  None  ____________________________________________   INITIAL IMPRESSION / ASSESSMENT AND PLAN / ED COURSE  Pertinent labs & imaging results that were available during my care of the patient were reviewed by me and considered in my medical decision making (see chart for details).   Patient presents to the emergency department with multiple areas of fluid collection, likely malignant effusions although no esophageal  cancer recurrence on CT.  Patient  having minimal symptoms of some chest tightness but was able to do an exercise class yesterday.  Vital signs are normal.  Clinically patient is not in tamponade.  I do not have ECHO here.  He has moderate effusions in the pleural space as well as some ascites.  Plan for Lasix.  Spoke with Dr. Stanford Breed with cardiology regarding the pericardial effusion.  Advises echo and if something concerning on that study the cardiology service can be consulted by the hospitalist.   Lab work is overall reassuring.  Patient given IV Lasix. COVID and Flu are negative.   Discussed patient's case with [facility], Dr. Jonelle Sidle to request admission. Patient and family (if present) updated with plan. Care transferred to [facility] service.  I reviewed all nursing notes, vitals, pertinent old records, EKGs, labs, imaging (as available).  ____________________________________________  FINAL CLINICAL IMPRESSION(S) / ED DIAGNOSES  Final diagnoses:  Pericardial effusion  Pleural effusion, bilateral  Other ascites    MEDICATIONS GIVEN DURING THIS VISIT:  Medications  furosemide (LASIX) injection 40 mg (40 mg Intravenous Given 05/16/20 1746)    Note:  This document was prepared using Dragon voice recognition software and may include unintentional dictation errors.  Nanda Quinton, MD, [facility]    Grason Brailsford, [facility], MD 05/16/20 1901

## 2020-05-16 NOTE — ED Notes (Signed)
ED Provider at bedside. 

## 2020-05-16 NOTE — Progress Notes (Signed)
Transfer from [facility] to [facility]  Hx of GE junction adenocarcinoma in remission Sent to ED due to new pericardial effusion, moderate bilateral pleural effusions and ascites seen on routine surveillance CT.  Has not felt any significant symptoms other than mild chest pressure when exercising yesterday. Normotensive on room. EKG showing NSR. Trop of 3.  EDP Dr. Laverta Baltimore discussed with cardiology Dr. Stanford Breed and pt will need echo and can consult as needed. Started on 40mg  IV Lasix but likely needs paracentesis with fluid studies for possibly malignant effusion.  CBC with mild leukopenia. Mild hypokalemia 3.4. No other significant lab findings.   Admit to card tele

## 2020-05-16 NOTE — ED Triage Notes (Signed)
Reports currently being treated for esophageal cancer.  Had a CT yesterday that showed fluid on the lungs.  C/o chest pressure.

## 2020-05-17 ENCOUNTER — Encounter (HOSPITAL_COMMUNITY)
Admission: EM | Disposition: A | Discharge status: Home / Self Care | Payer: Self-pay | Source: Home / Self Care | Attending: Emergency Medicine

## 2020-05-17 ENCOUNTER — Inpatient Hospital Stay (HOSPITAL_COMMUNITY): Payer: BC Managed Care – PPO

## 2020-05-17 ENCOUNTER — Inpatient Hospital Stay (HOSPITAL_BASED_OUTPATIENT_CLINIC_OR_DEPARTMENT_OTHER): Payer: BC Managed Care – PPO

## 2020-05-17 ENCOUNTER — Other Ambulatory Visit: Payer: Self-pay | Admitting: Family

## 2020-05-17 ENCOUNTER — Encounter (HOSPITAL_COMMUNITY): Payer: Self-pay | Admitting: Internal Medicine

## 2020-05-17 DIAGNOSIS — K7460 Unspecified cirrhosis of liver: Secondary | ICD-10-CM | POA: Diagnosis not present

## 2020-05-17 DIAGNOSIS — I313 Pericardial effusion (noninflammatory): Secondary | ICD-10-CM | POA: Diagnosis not present

## 2020-05-17 DIAGNOSIS — J811 Chronic pulmonary edema: Secondary | ICD-10-CM | POA: Diagnosis not present

## 2020-05-17 DIAGNOSIS — C160 Malignant neoplasm of cardia: Secondary | ICD-10-CM | POA: Diagnosis not present

## 2020-05-17 DIAGNOSIS — J90 Pleural effusion, not elsewhere classified: Secondary | ICD-10-CM | POA: Diagnosis not present

## 2020-05-17 DIAGNOSIS — R188 Other ascites: Secondary | ICD-10-CM | POA: Diagnosis not present

## 2020-05-17 HISTORY — PX: THORACENTESIS: SHX235

## 2020-05-17 LAB — ECHOCARDIOGRAM LIMITED
Height: 69 in
S' Lateral: 3.1 cm
Weight: 2347.2 oz

## 2020-05-17 LAB — COMPREHENSIVE METABOLIC PANEL
ALT: 29 U/L (ref 0–44)
AST: 39 U/L (ref 15–41)
Albumin: 2.9 g/dL — ABNORMAL LOW (ref 3.5–5.0)
Alkaline Phosphatase: 165 U/L — ABNORMAL HIGH (ref 38–126)
Anion gap: 9 (ref 5–15)
BUN: 7 mg/dL (ref 6–20)
CO2: 28 mmol/L (ref 22–32)
Calcium: 8.9 mg/dL (ref 8.9–10.3)
Chloride: 101 mmol/L (ref 98–111)
Creatinine, Ser: 0.97 mg/dL (ref 0.61–1.24)
GFR, Estimated: >60 mL/min (ref >60)
Glucose, Bld: 96 mg/dL (ref 70–99)
Potassium: 3.9 mmol/L (ref 3.5–5.1)
Sodium: 138 mmol/L (ref 135–145)
Total Bilirubin: 1.1 mg/dL (ref 0.3–1.2)
Total Protein: 6 g/dL — ABNORMAL LOW (ref 6.5–8.1)

## 2020-05-17 LAB — BODY FLUID CELL COUNT WITH DIFFERENTIAL
Eos, Fluid: 0 %
Lymphs, Fluid: 64 %
Monocyte-Macrophage-Serous Fluid: 27 % — ABNORMAL LOW (ref 50–90)
Neutrophil Count, Fluid: 9 % (ref 0–25)
Total Nucleated Cell Count, Fluid: 108 cu mm (ref 0–1000)

## 2020-05-17 LAB — CBC
HCT: 40.6 % (ref 39.0–52.0)
Hemoglobin: 14.2 g/dL (ref 13.0–17.0)
MCH: 32.3 pg (ref 26.0–34.0)
MCHC: 35 g/dL (ref 30.0–36.0)
MCV: 92.3 fL (ref 80.0–100.0)
Platelets: 165 10*3/uL (ref 150–400)
RBC: 4.4 MIL/uL (ref 4.22–5.81)
RDW: 14.6 % (ref 11.5–15.5)
WBC: 3.3 10*3/uL — ABNORMAL LOW (ref 4.0–10.5)
nRBC: 0 % (ref 0.0–0.2)

## 2020-05-17 LAB — PROTEIN, PLEURAL OR PERITONEAL FLUID: Total protein, fluid: <3 g/dL

## 2020-05-17 LAB — MAGNESIUM: Magnesium: 1.9 mg/dL (ref 1.7–2.4)

## 2020-05-17 LAB — GLUCOSE, PLEURAL OR PERITONEAL FLUID: Glucose, Fluid: 114 mg/dL

## 2020-05-17 LAB — ALBUMIN: Albumin: 2.8 g/dL — ABNORMAL LOW (ref 3.5–5.0)

## 2020-05-17 LAB — LACTATE DEHYDROGENASE: LDH: 127 U/L (ref 98–192)

## 2020-05-17 LAB — PROTEIN, TOTAL: Total Protein: 5.9 g/dL — ABNORMAL LOW (ref 6.5–8.1)

## 2020-05-17 LAB — LACTATE DEHYDROGENASE, PLEURAL OR PERITONEAL FLUID: LD, Fluid: 101 U/L — ABNORMAL HIGH (ref 3–23)

## 2020-05-17 SURGERY — THORACENTESIS
Laterality: Left

## 2020-05-17 MED ORDER — ACETAMINOPHEN 325 MG PO TABS: 650.0000 mg | ORAL_TABLET | Freq: Four times a day (QID) | ORAL | Status: DC | PRN

## 2020-05-17 MED ORDER — CHLORHEXIDINE GLUCONATE CLOTH 2 % EX PADS
6.0000 | MEDICATED_PAD | Freq: Every day | CUTANEOUS | Status: DC
  Administered 2020-05-17 – 2020-05-18 (×2): 6 via TOPICAL

## 2020-05-17 MED ORDER — SODIUM CHLORIDE 0.9% FLUSH
3.0000 mL | Freq: Two times a day (BID) | INTRAVENOUS | Status: DC
  Administered 2020-05-17 – 2020-05-18 (×2): 3 mL via INTRAVENOUS

## 2020-05-17 MED ORDER — LACTATED RINGERS IV SOLN: INTRAVENOUS | Status: DC

## 2020-05-17 MED ORDER — LOTEPREDNOL ETABONATE 0.25 % OP SUSP: 1.0000 [drp] | Freq: Two times a day (BID) | OPHTHALMIC | Status: DC

## 2020-05-17 MED ORDER — MORPHINE SULFATE (PF) 2 MG/ML IV SOLN: 2.0000 mg | INTRAVENOUS | Status: DC | PRN

## 2020-05-17 MED ORDER — ONDANSETRON HCL 4 MG PO TABS: 4.0000 mg | ORAL_TABLET | Freq: Four times a day (QID) | ORAL | Status: DC | PRN

## 2020-05-17 MED ORDER — BISACODYL 5 MG PO TBEC: 5.0000 mg | DELAYED_RELEASE_TABLET | Freq: Every day | ORAL | Status: DC | PRN

## 2020-05-17 MED ORDER — POLYETHYLENE GLYCOL 3350 17 G PO PACK: 17.0000 g | PACK | Freq: Every day | ORAL | Status: DC | PRN

## 2020-05-17 MED ORDER — ACETAMINOPHEN 650 MG RE SUPP: 650.0000 mg | Freq: Four times a day (QID) | RECTAL | Status: DC | PRN

## 2020-05-17 MED ORDER — PANTOPRAZOLE SODIUM 40 MG PO TBEC
40.0000 mg | DELAYED_RELEASE_TABLET | Freq: Every day | ORAL | Status: DC
  Administered 2020-05-17: 40 mg via ORAL
  Filled 2020-05-17: qty 1

## 2020-05-17 MED ORDER — HYDROCODONE-ACETAMINOPHEN 5-325 MG PO TABS: 1.0000 | ORAL_TABLET | ORAL | Status: DC | PRN

## 2020-05-17 MED ORDER — DOCUSATE SODIUM 100 MG PO CAPS
100.0000 mg | ORAL_CAPSULE | Freq: Two times a day (BID) | ORAL | Status: DC
  Filled 2020-05-17: qty 1

## 2020-05-17 MED ORDER — ONDANSETRON HCL 4 MG/2ML IJ SOLN: 4.0000 mg | Freq: Four times a day (QID) | INTRAMUSCULAR | Status: DC | PRN

## 2020-05-17 MED ORDER — HYDRALAZINE HCL 20 MG/ML IJ SOLN: 5.0000 mg | INTRAMUSCULAR | Status: DC | PRN

## 2020-05-17 NOTE — H&P (Signed)
History and Physical    BLAIN HUNSUCKER JOI:[id number] DOB: 30-Oct-1965 DOA: 05/16/2020  PCP: Cari Caraway, MD Consultants:  Marin Olp - oncology Patient coming from: Home - lives with wife; NOK: Wife, Caren Griffins, [phone]  Chief Complaint: Chest pain  HPI: Matthew Reynolds is a 55 y.o. male with medical history significant of GE junction adenocarcinoma with liver mets presenting with chest pain.  Routine imaging showed a small pericardial effusion with moderate B pleural effusions and some ascites.  He had a routine CT yesterday to check on his cancer.  They noticed some fluid in his lungs, around his heart, and in his abdomen.  They suggested he come in for evaluation.  He was given Lasix with good diuresis.  He hasn't been feeling bad.  He had some symptoms that he attributed to his treatments - abdominal pain, difficulty swallowing.  No prior h/o fluid.  He gets constipated after treatment and attributed it to that.  No SOB, has been exercising.  He has esophageal cancer, diagnosed in 11/2017.  Went through chemo.  Localized recurrent in 02/2019 and had radiation therapy.  This was a routine scan and no issues.    ED Course:  [facility] to [facility] transfer, per Dr. Flossie Buffy:  Hx of GE junction adenocarcinoma in remission Sent to ED due to new pericardial effusion, moderate bilateral pleural effusions and ascites seen on routine surveillance CT.  Has not felt any significant symptoms other than mild chest pressure when exercising yesterday. Normotensive on room. EKG showing NSR. Trop of 3.  EDP Dr. Laverta Baltimore discussed with cardiology Dr. Stanford Breed and pt will need echo and can consult as needed. Started on 40mg  IV Lasix but likely needs paracentesis with fluid studies for possibly malignant effusion.  CBC with mild leukopenia. Mild hypokalemia 3.4. No other significant lab findings.    Review of Systems: As per HPI; otherwise review of systems reviewed and negative.   Ambulatory Status:  Ambulates without  assistance  COVID Vaccine Status:  Complete plus booster, had COVID in 02/2020  Past Medical History:  Diagnosis Date  . Adenocarcinoma of cardio-esophageal junction ([place]) 12/10/2017  . Goals of care, counseling/discussion 12/10/2017  . Iron deficiency anemia due to chronic blood loss 12/11/2017  . Malignant neoplasm metastatic to liver ([place]) 12/10/2017  . 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 CYST EXCISION Left 01/15/2016   Procedure: EXCISION SEBACEOUS CYST LEFT UPPER BACK;  Surgeon: Georganna Skeans, MD;  Location: [facility];  Service: General;  Laterality: Left;  . IR IMAGING GUIDED PORT INSERTION  12/15/2017    Social History   Socioeconomic History  . Marital status: Married    Spouse name: Not on file  . Number of children: Not on file  . Years of education: Not on file  . Highest education level: Not on file  Occupational History  . Not on file  Tobacco Use  . Smoking status: Never Smoker  . Smokeless tobacco: Never Used  Vaping Use  . Vaping Use: Never used  Substance and Sexual Activity  . Alcohol use: Yes    Comment: weekly  . Drug use: No  . Sexual activity: Not on file  Other Topics Concern  . Not on file  Social History Narrative  . Not on file   Social Determinants of Health   Financial Resource Strain: Not on file  Food Insecurity: Not on file  Transportation Needs: Not on file  Physical  Activity: Not on file  Stress: Not on file  Social Connections: Not on file  Intimate Partner Violence: Not on file    No Known Allergies  History reviewed. No pertinent family history.  Prior to Admission medications   Medication Sig Start Date End Date Taking? Authorizing Provider  dexlansoprazole (DEXILANT) 60 MG capsule Take 1 capsule (60 mg total) by mouth daily. 08/31/19   [place], Holli Humbles, NP  EYSUVIS 0.25 % SUSP Instill 1 drop into both eyes twice a day 12/31/19    [provider]  lidocaine-prilocaine (EMLA) cream Apply 1 application topically as needed. 06/02/18   [name], MD  Multiple Vitamin (MULTI-VITAMIN DAILY PO) Take by mouth.    [provider]  prochlorperazine (COMPAZINE) 10 MG tablet Take 1 tablet (10 mg total) by mouth every 6 (six) hours as needed (Nausea or vomiting). Patient not taking: Reported on 04/25/2020 08/06/19   Eliezer Bottom, NP  sucralfate (CARAFATE) 1 GM/10ML suspension Take 10 mLs (1 g total) by mouth 3 (three) times daily. Patient not taking: Reported on 04/25/2020 07/19/19   Eliezer Bottom, NP    Physical Exam: Vitals:   05/17/20 1154 05/17/20 1442 05/17/20 1541 05/17/20 1604  BP: (!) 124/97 131/81 116/87 113/87  Pulse: 96 (!) 103 (!) 110 98  Resp: 19 20 (!) 22 20  Temp: 98.6 F (37 C) 98.4 F (36.9 C) 98.6 F (37 C) 98.1 F (36.7 C)  TempSrc: Oral Oral Oral Oral  SpO2: 100% 99% 98% 100%  Weight:      Height:         . General:  Appears calm and comfortable and is in NAD . Eyes:  EOMI, normal lids, iris . ENT:  grossly normal hearing, lips & tongue, mmm; appropriate dentition . Neck:  no LAD, masses or thyromegaly; he is thin enough to trace his entire port-a-cath under his skin along his R chest wall . Cardiovascular:  RRR, no m/r/g. No LE edema.  Marland Kitchen Respiratory:   Diminished breath sounds in the B bases.  Normal respiratory effort. . Abdomen:  soft, NT, ND, NABS . Skin:  no rash or induration seen on limited exam . Musculoskeletal:  grossly normal tone BUE/BLE, good ROM, no bony abnormality . Psychiatric:  grossly normal mood and affect, speech fluent and appropriate, AOx3 . Neurologic:  CN 2-12 grossly intact, moves all extremities in coordinated fashion    Radiological Exams on Admission: Independently reviewed - see discussion in A/P where applicable  DG Chest 2 View  Result Date: 05/16/2020 CLINICAL DATA:  Chest tightness x1 week. EXAM: CHEST - 2 VIEW COMPARISON:   None. FINDINGS: A right-sided venous Port-A-Cath is seen with its distal tip noted at the junction of the superior vena cava and right atrium. Moderate to marked severity atelectasis and/or infiltrate is seen within the left lung base. There is a very small right pleural effusion. A small left pleural effusion is also seen. No pneumothorax is identified. The heart size and mediastinal contours are within normal limits. The visualized skeletal structures are unremarkable. IMPRESSION: 1. Moderate to marked severity left basilar atelectasis and/or infiltrate. 2. Bilateral pleural effusions, left greater than right. Electronically Signed   By: Virgina Norfolk M.D.   On: 05/16/2020 17:38   CT Chest W Contrast  Result Date: 05/16/2020 CLINICAL DATA:  Gastrointestinal carcinoma surveillance. GE junction adenocarcinoma. Chemotherapy ongoing EXAM: CT CHEST, ABDOMEN, AND PELVIS WITH CONTRAST TECHNIQUE: Multidetector CT imaging of the chest, abdomen and pelvis was  performed following the standard protocol during bolus administration of intravenous contrast. CONTRAST:  176mL OMNIPAQUE IOHEXOL 300 MG/ML  SOLN COMPARISON:  PET-CT 01/27/2020, 08/25/2019 FINDINGS: CT CHEST FINDINGS Cardiovascular: Port in the anterior chest wall with tip in distal SVC. Small pericardial effusion is new from prior. Fusion is most prominent along the base the heart measuring 13 mm in depth (image 41/coronal series 5). Mediastinum/Nodes: Continued mild esophageal wall thickening through the distal esophagus up to the GE junction. No change from prior. No mass lesion or obstruction. No enlarged mediastinal lymph nodes. Lungs/Pleura: Bilateral moderate pleural effusions are new from prior. New bibasilar passive atelectasis. Musculoskeletal: No aggressive osseous lesion. CT ABDOMEN AND PELVIS FINDINGS Hepatobiliary: Liver has a fine nodular contour. No enhancing lesion. Gallbladder is collapsed. Moderate volume of intraperitoneal free fluid along  the margin the RIGHT hepatic lobe. Portal veins are patent. Pancreas: Pancreas is normal. No ductal dilatation. No pancreatic inflammation. Spleen: Normal spleen Adrenals/urinary tract: Adrenal glands and kidneys are normal. The ureters and bladder normal. Stomach/Bowel: Stomach, small bowel, appendix, and cecum are normal. The colon and rectosigmoid colon are normal. Vascular/Lymphatic: Abdominal aorta is normal caliber. There is no retroperitoneal or periportal lymphadenopathy. No pelvic lymphadenopathy. Reproductive: Prostate normal Other: Moderate volume of intraperitoneal free fluid extends along the pericolic gutters into the pelvis. New from prior Musculoskeletal: No aggressive osseous lesion. 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 IMPRESSION: Chest Impression: 1. New pericardial effusion and moderate bilateral pleural effusions. 2. No evidence of esophageal cancer recurrence. Stable thickening through the distal esophagus. 3. No pulmonary metastasis or mediastinal adenopathy. Abdomen / Pelvis Impression: 1. No metastatic disease in the abdomen pelvis. 2. New intraperitoneal free fluid in the abdomen pelvis. 3. With new pericardial effusion, pleural effusions, and intraperitoneal free fluid query cardiac dysfunction or cardiac toxicity. These results will be called to the ordering clinician or representative by the Radiologist Assistant, and communication documented in the PACS or [employer]. Electronically Signed   By: Suzy Bouchard M.D.   On: 05/16/2020 16:10   CT Abdomen Pelvis W Contrast  Result Date: 05/16/2020 CLINICAL DATA:  Gastrointestinal carcinoma surveillance. GE junction adenocarcinoma. Chemotherapy ongoing EXAM: CT CHEST, ABDOMEN, AND PELVIS WITH CONTRAST TECHNIQUE: Multidetector CT imaging of the chest, abdomen and pelvis was performed following the standard protocol during bolus administration of intravenous contrast. CONTRAST:  158mL OMNIPAQUE IOHEXOL 300 MG/ML   SOLN COMPARISON:  PET-CT 01/27/2020, 08/25/2019 FINDINGS: CT CHEST FINDINGS Cardiovascular: Port in the anterior chest wall with tip in distal SVC. Small pericardial effusion is new from prior. Fusion is most prominent along the base the heart measuring 13 mm in depth (image 41/coronal series 5). Mediastinum/Nodes: Continued mild esophageal wall thickening through the distal esophagus up to the GE junction. No change from prior. No mass lesion or obstruction. No enlarged mediastinal lymph nodes. Lungs/Pleura: Bilateral moderate pleural effusions are new from prior. New bibasilar passive atelectasis. Musculoskeletal: No aggressive osseous lesion. CT ABDOMEN AND PELVIS FINDINGS Hepatobiliary: Liver has a fine nodular contour. No enhancing lesion. Gallbladder is collapsed. Moderate volume of intraperitoneal free fluid along the margin the RIGHT hepatic lobe. Portal veins are patent. Pancreas: Pancreas is normal. No ductal dilatation. No pancreatic inflammation. Spleen: Normal spleen Adrenals/urinary tract: Adrenal glands and kidneys are normal. The ureters and bladder normal. Stomach/Bowel: Stomach, small bowel, appendix, and cecum are normal. The colon and rectosigmoid colon are normal. Vascular/Lymphatic: Abdominal aorta is normal caliber. There is no retroperitoneal or periportal lymphadenopathy. No pelvic lymphadenopathy. Reproductive: Prostate  normal Other: Moderate volume of intraperitoneal free fluid extends along the pericolic gutters into the pelvis. New from prior Musculoskeletal: No aggressive osseous lesion. 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 IMPRESSION: Chest Impression: 1. New pericardial effusion and moderate bilateral pleural effusions. 2. No evidence of esophageal cancer recurrence. Stable thickening through the distal esophagus. 3. No pulmonary metastasis or mediastinal adenopathy. Abdomen / Pelvis Impression: 1. No metastatic disease in the abdomen pelvis. 2. New  intraperitoneal free fluid in the abdomen pelvis. 3. With new pericardial effusion, pleural effusions, and intraperitoneal free fluid query cardiac dysfunction or cardiac toxicity. These results will be called to the ordering clinician or representative by the Radiologist Assistant, and communication documented in the PACS or [employer]. Electronically Signed   By: Suzy Bouchard M.D.   On: 05/16/2020 16:10   ECHOCARDIOGRAM LIMITED  Result Date: 05/17/2020    ECHOCARDIOGRAM LIMITED REPORT   Patient Name:   RONDLE LOHSE Date of Exam: 05/17/2020 Medical Rec #:  [id number]      Height:       69.0 in Accession #:    [id number]     Weight:       146.7 lb Date of Birth:  [birth year]       BSA:          1.811 m Patient Age:    28 years       BP:           123/86 mmHg Patient Gender: M              HR:           81 bpm. Exam Location:  Inpatient Procedure: Limited Echo, Limited Color Doppler and Cardiac Doppler Indications:    Pericardial effusion I31.3  History:        Patient has prior history of Echocardiogram examinations, most                 recent 01/03/2020.  Sonographer:    Bernadene Person RDCS Referring Phys: [place]  1. Left ventricular ejection fraction, by estimation, is 55 to 60%. The left ventricle has normal function. The left ventricle has no regional wall motion abnormalities. Left ventricular diastolic parameters are consistent with Grade I diastolic dysfunction (impaired relaxation).  2. Right ventricular systolic function is mildly reduced. The right ventricular size is normal. There is normal pulmonary artery systolic pressure. The estimated right ventricular systolic pressure is 10.9 mmHg.  3. The mitral valve is normal in structure. No evidence of mitral valve regurgitation. No evidence of mitral stenosis.  4. The aortic valve is tricuspid. Aortic valve regurgitation is not visualized. No aortic stenosis is present.  5. The inferior vena cava is normal in size with  greater than 50% respiratory variability, suggesting right atrial pressure of 3 mmHg.  6. There is a pericardial effusion present, primarily subcostally and adjacent to the RV. The effusion is large subcostally but small around the rest of the heart. The IVC is small, < 25% respirophasic variation in the mitral E inflow velocity, and there does not appear to be RV diastolic collapse. Tamponade does not appear to be present. FINDINGS  Left Ventricle: Left ventricular ejection fraction, by estimation, is 55 to 60%. The left ventricle has normal function. The left ventricle has no regional wall motion abnormalities. The left ventricular internal cavity size was normal in size. Left ventricular diastolic parameters are consistent with Grade I diastolic dysfunction (impaired relaxation). Right Ventricle:  The right ventricular size is normal. Right ventricular systolic function is mildly reduced. There is normal pulmonary artery systolic pressure. The tricuspid regurgitant velocity is 1.91 m/s, and with an assumed right atrial pressure of  3 mmHg, the estimated right ventricular systolic pressure is 63.8 mmHg. Left Atrium: Left atrial size was normal in size. Right Atrium: Right atrial size was normal in size. Pericardium: There is a pericardial effusion present, primarily subcostally and adjacent to the RV. The effusion is large subcostally but small around the rest of the heart. The IVC is small, < 25% respirophasic variation in the mitral E inflow velocity,  and there does not appear to be RV diastolic collapse. Tamponade does not appear to be present. Mitral Valve: The mitral valve is normal in structure. No evidence of mitral valve stenosis. Tricuspid Valve: The tricuspid valve is normal in structure. Tricuspid valve regurgitation is trivial. Aortic Valve: The aortic valve is tricuspid. Aortic valve regurgitation is not visualized. No aortic stenosis is present. Pulmonic Valve: The pulmonic valve was normal in  structure. Pulmonic valve regurgitation is not visualized. Venous: The inferior vena cava is normal in size with greater than 50% respiratory variability, suggesting right atrial pressure of 3 mmHg. IAS/Shunts: No atrial level shunt detected by color flow Doppler. LEFT VENTRICLE PLAX 2D LVIDd:         4.40 cm LVIDs:         3.10 cm LV PW:         0.70 cm LV IVS:        0.70 cm  LEFT ATRIUM         Index LA diam:    2.70 cm 1.49 cm/m  TRICUSPID VALVE TR Peak grad:   14.6 mmHg TR Vmax:        191.00 cm/s Loralie Champagne MD Electronically signed by Loralie Champagne MD Signature Date/Time: 05/17/2020/9:48:07 AM    Final     EKG: Independently reviewed.  NSR with rate 86; nonspecific ST changes with no evidence of acute ischemia   Labs on Admission: I have personally reviewed the available labs and imaging studies at the time of the admission.  Pertinent labs:   K+ 3.4 Glucose 114 BNP 27.6 HS troponin 3, 4 WBC 3.8 -> 3.3 INR 1.1 COVID/flu negative   Assessment/Plan Principal Problem:   Adenocarcinoma of cardio-esophageal junction (HCC) Active Problems:   Malignant neoplasm metastatic to liver (HCC)   Pericardial effusion   Pleural effusion    -Patient with h/o metastatic adenoCA of the GE junction -He is currently being treated with Enhertu q3 weeks since 06/29/19 -Followed by [name] -He was sent for routine repeat imaging yesterday and was found to have a new pericardial effusion; moderate B pleural effusions; and new intraperitoneal free fluid in the abdomen/pelvis -Echo was performed which showed preserved EF and grade 1 diastolic dysfunction with pericardial effusion that is large subcostally but small around the rest of the heart without apparent tamponade physiology -Pulmonology was consulted for thoracentesis, which was performed today; studies are pending -f/u CXR is occurring now -I have also notified [name] of admission and he will plan to see the patient tomorrow -Will  place in observation on telemetry for now      Note: This patient has been tested and is negative for the novel coronavirus COVID-19. The patient has been fully vaccinated against COVID-19.   Level of care: Telemetry Cardiac DVT prophylaxis:  SCDs Code Status:  Full - confirmed with patient/family Family Communication:  Wife was present throughout the evaluation Disposition Plan:  The patient is from: home  Anticipated d/c is to: home without [facility] services   Anticipated d/c date will depend on clinical response to treatment, but possibly as early as tomorrow if he has excellent response to treatment  Patient is currently: acutely ill Consults called: Pulmonology; Oncology Admission status:  It is my clinical opinion that referral for OBSERVATION is reasonable and necessary in this patient based on the above information provided. The aforementioned taken together are felt to place the patient at high risk for further clinical deterioration. However it is anticipated that the patient may be medically stable for discharge from the hospital within 24 to 48 hours.    Karmen Bongo MD [facility]   How to contact the [facility] Attending or Consulting provider [place] or covering provider during after hours [place], for this patient?  1. Check the care team in [facility] and look for a) attending/consulting [facility] provider listed and b) the [facility] team listed 2. Log into [email or web address] and use [facility]'s universal password to access. If you do not have the password, please contact the hospital operator. 3. Locate the [facility] provider you are looking for under [facility] and page to a number that you can be directly reached. 4. If you still have difficulty reaching the provider, please page the [facility] (Director on Call) for the Hospitalists listed on amion for assistance.   05/17/2020, 5:18 PM

## 2020-05-17 NOTE — Progress Notes (Signed)
  Echocardiogram 2D Echocardiogram has been performed. Notified Dr. Aundra Dubin.  Matthew Reynolds 05/17/2020, 9:19 AM

## 2020-05-17 NOTE — Procedures (Signed)
Thoracentesis  Procedure Note  Matthew Reynolds  [id number]  1965/07/06  Date:05/17/20  Time:3:44 PM   Provider Performing:Jamie Hafford Jerilynn Mages Ayesha Rumpf   Procedure: Thoracentesis with imaging guidance [phone])  Indication(s) Pleural Effusion  Consent Risks of the procedure as well as the alternatives and risks of each were explained to the patient and/or caregiver.  Consent for the procedure was obtained and is signed in the bedside chart  Anesthesia Topical only with 1% lidocaine    Time Out Verified patient identification, verified procedure, site/side was marked, verified correct patient position, special equipment/implants available, medications/allergies/relevant history reviewed, required imaging and test results available.   Sterile Technique Maximal sterile technique including full sterile barrier drape, hand hygiene, sterile gown, sterile gloves, mask, hair covering, sterile ultrasound probe cover (if used).   Procedure Description Ultrasound was used to identify appropriate pleural anatomy for placement and overlying skin marked.  Area of drainage cleaned and draped in sterile fashion. Lidocaine was used to anesthetize the skin and subcutaneous tissue.  1400cc of straw-colored fluid was drained from the left pleural space. Catheter then removed and bandaid applied to site.   Complications/Tolerance None; patient tolerated the procedure well. Chest X-ray is ordered to confirm no post-procedural complication.   EBL Minimal   Specimen(s) Pleural fluid   The entire procedure was proctored by Georgann Housekeeper, NP and supervised by Lenice Llamas, MD.  Lestine Mount, PA-C [facility] 05/17/20 3:45 PM  Please see [email or web address] for pager details.

## 2020-05-17 NOTE — Progress Notes (Signed)
Mobility Specialist - Progress Note   05/17/20 1132  Mobility  Activity Ambulated in hall  Level of Assistance Independent  Assistive Device None  Distance Ambulated (ft) 1120 ft  Mobility Response Tolerated well  Mobility performed by Mobility specialist  $Mobility charge 1 Mobility   Pre-mobility: 88 HR During mobility: 115 HR Post-mobility: 93 HR  Asx throughout ambulation. Pt was encouraged to ambulate in hallways w/ his wife, he expressed willingness to do so.   Pricilla Handler Mobility Specialist Mobility Specialist Phone: [phone]

## 2020-05-17 NOTE — Progress Notes (Signed)
Pt received from endo after thoracentesis. Site clean, dry and intact. VSS. Call light in reach.  Clyde Canterbury, RN

## 2020-05-17 NOTE — Consult Note (Signed)
NAME:  Matthew Reynolds, MRN:  [id number], DOB:  04-01-1965, LOS: 1 ADMISSION DATE:  05/16/2020, CONSULTATION DATE:  05/17/2020 REFERRING MD:  Dr. Lorin Mercy, CHIEF COMPLAINT:  Pleural effusion  Brief History:  55 year old male with hx of metastatic GE junction adenocarcinoma in remission admitted with new pericardial effusion, bilateral moderate pleural effusions, and ascites found on surveillance CT.  PCCM consulted for pleural effusions and possible diagnostic thoracentesis.   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 on current maintenance Enhertu every three weeks s/p 12 cycle started on 06/29/2019 presenting after surveillance CT on 3/1 showed a new small pericardial effusion, bilateral moderate pleural effusions left greater than right, and ascites.  He was told to come to ER for further evaluation.  TRH to admit.    He denies any current chest pain, shortness of breath, fever, chills, cough or other complaints.  Is a never smoker, no ETOH or illicit drug abuse.  Had reported some mild exertional chest pain since resolved.  He has been afebrile and hemodynamically stable.  BNP and troponin negative.  Mild leukopenia and K 3.4 otherwise normal lab findings.  Was started on lasix.  Pulmonary asked to see for pleural effusions and possible diagnostic thoracentesis.  He is not on any anticoagulants or aspirin.   Past Medical History:  GE junction adenocarcinoma diagnosed 03/03/2019 with liver mets, foot drop left foot  Significant Hospital Events:  3/2 admitted [facility]  Consults:  Cardiology Pulmonary   Procedures:   Significant Diagnostic Tests:  3/1 CT a/p >> 1. No metastatic disease in the abdomen pelvis. 2. New intraperitoneal free fluid in the abdomen pelvis. 3. With new pericardial effusion, pleural effusions, and intraperitoneal free fluid  query cardiac dysfunction or cardiac toxicity.  3/1 CT chest >> 1. New pericardial effusion and moderate bilateral pleural effusions. 2. No evidence of esophageal cancer recurrence. Stable thickening through the distal esophagus. 3. No pulmonary metastasis or mediastinal adenopathy  Micro Data:  3/1 SARS/ Flu >> neg  Antimicrobials:  n/a  Interim History / Subjective:    Objective   Blood pressure 95/68, pulse 99, temperature 97.9 F (36.6 C), temperature source Oral, resp. rate 20, height _0  (1.753 m), weight 66.5 kg, SpO2 98 %.        Intake/Output Summary (Last 24 hours) at 05/17/2020 3474 Last data filed at 05/16/2020 1904 Gross per 24 hour  Intake --  Output 1350 ml  Net -1350 ml   Filed Weights   05/16/20 1710 05/16/20 2228  Weight: 65.8 kg 66.5 kg   Examination: General:  Well appearing thin man, no distress HEENT: MM pink/moist Neuro: normal speech, moves all 4 extremities, no focal asymmetry CV: s1s2 , no m/r/g PULM:  Diminished bilateral lung bases GI: soft, bsx4 active  Extremities: warm/dry, no edema  Skin: no rashes or lesions  Resolved Hospital Problem list    Assessment & Plan:   Bilateral pleural effusions, L > R - patient agreeable to diagnostic left thoracentesis today.  Will send fluid for cytology, cell count w/differential, LDH, protein, and culture; negative initial cytology does not rule out as malignant process - CXR post procedure  - PCCM will continue to follow   Small pericardial effusion - per primary, TTE pending - tele monitoring, remains hemodynamically stable, neg trops/ BNP   GE junction adenocarcinoma with mets to  liver with ascites  - on maintenance Enhertu every three weeks s/p 12 cycle started on 06/29/2019 - per primary  - daily lasix - nodular liver noted on scan - denies any etoh use or previous hepatitis diagnosis.  - ongoing outpatient f/u with [name]   Best practice (evaluated daily)  Diet: per primary   Pain/Anxiety/Delirium protocol (if indicated): n/a VAP protocol (if indicated): n/a DVT prophylaxis: SCDs GI prophylaxis: protonix Glucose control: n/a Mobility: as tolerated Disposition: telemetry   Lenice Llamas, MD Pulmonary and [place] Pager: see [facility]ION Office:[phone]   Labs   CBC: Recent Labs  Lab 05/16/20 1712 05/17/20 0737  WBC 3.8* 3.3*  HGB 13.9 14.2  HCT 41.1 40.6  MCV 93.6 92.3  PLT 166 563    Basic Metabolic Panel: Recent Labs  Lab 05/16/20 1712  NA 134*  K 3.4*  CL 99  CO2 27  GLUCOSE 114*  BUN 10  CREATININE 0.80  CALCIUM 8.4*   GFR: Estimated Creatinine Clearance: 99.3 mL/min (by C-G formula based on SCr of 0.8 mg/dL). Recent Labs  Lab 05/16/20 1712 05/17/20 0737  WBC 3.8* 3.3*    Liver Function Tests: No results for input(s): AST, ALT, ALKPHOS, BILITOT, PROT, ALBUMIN in the last 168 hours. No results for input(s): LIPASE, AMYLASE in the last 168 hours. No results for input(s): AMMONIA in the last 168 hours.  ABG No results found for: PHART, PCO2ART, PO2ART, HCO3, TCO2, ACIDBASEDEF, O2SAT   Coagulation Profile: Recent Labs  Lab 05/16/20 1743  INR 1.1    Cardiac Enzymes: No results for input(s): CKTOTAL, CKMB, CKMBINDEX, TROPONINI in the last 168 hours.  HbA1C: No results found for: HGBA1C  CBG: No results for input(s): GLUCAP in the last 168 hours.  Review of Systems:   +early satiety - dyspnea, cough Otherwise 10 point ROS negative.  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   Surgical History:   Past Surgical History:  Procedure Laterality Date  .  CYST EXCISION Left 01/15/2016   Procedure: EXCISION SEBACEOUS CYST LEFT UPPER BACK;  Surgeon: Georganna Skeans, MD;  Location: [facility];  Service: General;  Laterality: Left;  . IR IMAGING GUIDED PORT INSERTION  12/15/2017     Social History:   reports that he has never smoked. He has never used smokeless tobacco. He reports current alcohol use. He reports that he does not use drugs.   Family History:  His family history is not on file.   Allergies No Known Allergies   Home Medications  Prior to Admission medications   Medication Sig Start Date End Date Taking? Authorizing Provider  dexlansoprazole (DEXILANT) 60 MG capsule Take 1 capsule (60 mg total) by mouth daily. 08/31/19   [place], Holli Humbles, NP  EYSUVIS 0.25 % SUSP Instill 1 drop into both eyes twice a day 12/31/19   [provider]  lidocaine-prilocaine (EMLA) cream Apply 1 application topically as needed. 06/02/18   [name], MD  Multiple Vitamin (MULTI-VITAMIN DAILY PO) Take by mouth.    [provider]

## 2020-05-18 ENCOUNTER — Encounter (HOSPITAL_COMMUNITY): Payer: Self-pay | Admitting: Internal Medicine

## 2020-05-18 ENCOUNTER — Observation Stay (HOSPITAL_COMMUNITY): Payer: BC Managed Care – PPO

## 2020-05-18 ENCOUNTER — Other Ambulatory Visit (HOSPITAL_COMMUNITY): Payer: Self-pay | Admitting: Internal Medicine

## 2020-05-18 DIAGNOSIS — K7460 Unspecified cirrhosis of liver: Secondary | ICD-10-CM

## 2020-05-18 DIAGNOSIS — I313 Pericardial effusion (noninflammatory): Secondary | ICD-10-CM | POA: Diagnosis not present

## 2020-05-18 DIAGNOSIS — J910 Malignant pleural effusion: Secondary | ICD-10-CM | POA: Diagnosis not present

## 2020-05-18 DIAGNOSIS — C160 Malignant neoplasm of cardia: Secondary | ICD-10-CM | POA: Diagnosis not present

## 2020-05-18 DIAGNOSIS — C801 Malignant (primary) neoplasm, unspecified: Secondary | ICD-10-CM | POA: Diagnosis not present

## 2020-05-18 DIAGNOSIS — R188 Other ascites: Secondary | ICD-10-CM | POA: Diagnosis not present

## 2020-05-18 DIAGNOSIS — J90 Pleural effusion, not elsewhere classified: Secondary | ICD-10-CM | POA: Diagnosis not present

## 2020-05-18 LAB — CBC
HCT: 38.5 % — ABNORMAL LOW (ref 39.0–52.0)
Hemoglobin: 12.8 g/dL — ABNORMAL LOW (ref 13.0–17.0)
MCH: 30.9 pg (ref 26.0–34.0)
MCHC: 33.2 g/dL (ref 30.0–36.0)
MCV: 93 fL (ref 80.0–100.0)
Platelets: 157 10*3/uL (ref 150–400)
RBC: 4.14 MIL/uL — ABNORMAL LOW (ref 4.22–5.81)
RDW: 14.3 % (ref 11.5–15.5)
WBC: 3.7 10*3/uL — ABNORMAL LOW (ref 4.0–10.5)
nRBC: 0 % (ref 0.0–0.2)

## 2020-05-18 LAB — URINALYSIS, ROUTINE W REFLEX MICROSCOPIC
Bilirubin Urine: NEGATIVE
Glucose, UA: NEGATIVE mg/dL
Hgb urine dipstick: NEGATIVE
Ketones, ur: NEGATIVE mg/dL
Leukocytes,Ua: NEGATIVE
Nitrite: NEGATIVE
Protein, ur: NEGATIVE mg/dL
Specific Gravity, Urine: 1.013 (ref 1.005–1.030)
pH: 6 (ref 5.0–8.0)

## 2020-05-18 LAB — BASIC METABOLIC PANEL
Anion gap: 9 (ref 5–15)
BUN: 8 mg/dL (ref 6–20)
CO2: 27 mmol/L (ref 22–32)
Calcium: 8.7 mg/dL — ABNORMAL LOW (ref 8.9–10.3)
Chloride: 99 mmol/L (ref 98–111)
Creatinine, Ser: 0.91 mg/dL (ref 0.61–1.24)
GFR, Estimated: >60 mL/min (ref >60)
Glucose, Bld: 112 mg/dL — ABNORMAL HIGH (ref 70–99)
Potassium: 3.7 mmol/L (ref 3.5–5.1)
Sodium: 135 mmol/L (ref 135–145)

## 2020-05-18 LAB — HIV ANTIBODY (ROUTINE TESTING W REFLEX): HIV Screen 4th Generation wRfx: NONREACTIVE

## 2020-05-18 LAB — PH, BODY FLUID: pH, Body Fluid: 8

## 2020-05-18 MED ORDER — ENOXAPARIN SODIUM 40 MG/0.4ML ~~LOC~~ SOLN
40.0000 mg | Freq: Every day | SUBCUTANEOUS | Status: DC
  Administered 2020-05-18: 40 mg via SUBCUTANEOUS
  Filled 2020-05-18: qty 0.4

## 2020-05-18 MED ORDER — SPIRONOLACTONE 100 MG PO TABS: 50.0000 mg | ORAL_TABLET | Freq: Every day | ORAL | 1 refills | Status: DC

## 2020-05-18 MED ORDER — FUROSEMIDE 40 MG PO TABS: 40.0000 mg | ORAL_TABLET | Freq: Every day | ORAL | 1 refills | Status: DC

## 2020-05-18 MED ORDER — SPIRONOLACTONE 25 MG PO TABS
100.0000 mg | ORAL_TABLET | Freq: Every day | ORAL | Status: DC
  Administered 2020-05-18: 100 mg via ORAL
  Filled 2020-05-18: qty 4

## 2020-05-18 MED ORDER — FUROSEMIDE 40 MG PO TABS
40.0000 mg | ORAL_TABLET | Freq: Every day | ORAL | Status: DC
  Administered 2020-05-18: 40 mg via ORAL
  Filled 2020-05-18: qty 1

## 2020-05-18 MED FILL — FUROSEMIDE 40 MG TAB: 40 | 33 days supply | Qty: 30 | Fill #0

## 2020-05-18 MED FILL — SPIRONOLACTONE 100 MG TAB: 100 | 33 days supply | Qty: 30 | Fill #0

## 2020-05-18 NOTE — Consult Note (Signed)
Referral MD  Reason for Referral: Metastatic adenocarcinoma of the GE junction now with effusions.  Chief Complaint  Patient presents with  . Chest Pain  : Had fluid around the lung.  HPI: Matthew Reynolds is well-known to me.  He is a really nice 55 year old white male.  He has metastatic adenocarcinoma the GE junction.  He presented back about 3 years ago.  He has had incredible response to treatment.  He has had various treatments.  He now is on maintenance therapy with Enhertu as his tumor is HER-2 positive.  So far, he has had a very nice response to Enhertu without any side effects.  He had a routine CT scan for restaging earlier this week.  There is no obvious recurrent malignancy but he had a large left pleural effusion.  A right pleural effusion.  Had pericardial effusion and some ascites.  He really had no symptoms.  Had a little bit of chest discomfort.  There is no cough.  There is no fever.  He had no bleeding.  He had no change in bowel or bladder habits.  There is been no weight loss.  He has not been around anybody sick.  He did have the [facility] back in December but had minimal symptoms.  He subsequently was admitted.  He had a thoracentesis yesterday.  He had 1400 cc of fluid that was removed from the left lung.  He had an echocardiogram that was done.  He did have a pericardial effusion.  There is no tamponade physiology.  He had a good ejection fraction of 55-60%.  Again, he looks quite good.  He has been very active.  Overall, I would say his performance status is ECOG 1 right now.   Past Medical History:  Diagnosis Date  . Adenocarcinoma of cardio-esophageal junction ([place]) 12/10/2017  . Goals of care, counseling/discussion 12/10/2017  . Iron deficiency anemia due to chronic blood loss 12/11/2017  . Malignant neoplasm metastatic to liver ([place]) 12/10/2017  . 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 CYST EXCISION Left 01/15/2016   Procedure: EXCISION SEBACEOUS CYST LEFT UPPER BACK;  Surgeon: Georganna Skeans, MD;  Location: [facility];  Service: General;  Laterality: Left;  . IR IMAGING GUIDED PORT INSERTION  12/15/2017  :   Current Facility-Administered Medications:  .  acetaminophen (TYLENOL) tablet 650 mg, 650 mg, Oral, Q6H PRN **OR** acetaminophen (TYLENOL) suppository 650 mg, 650 mg, Rectal, Q6H PRN, Karmen Bongo, MD .  bisacodyl (DULCOLAX) EC tablet 5 mg, 5 mg, Oral, Daily PRN, Karmen Bongo, MD .  Chlorhexidine Gluconate Cloth 2 % PADS 6 each, 6 each, Topical, Daily, Karmen Bongo, MD, 6 each at 05/17/20 0830 .  docusate sodium (COLACE) capsule 100 mg, 100 mg, Oral, BID, Karmen Bongo, MD .  hydrALAZINE (APRESOLINE) injection 5 mg, 5 mg, Intravenous, Q4H PRN, Karmen Bongo, MD .  HYDROcodone-acetaminophen (NORCO/VICODIN) 5-325 MG per tablet 1-2 tablet, 1-2 tablet, Oral, Q4H PRN, Karmen Bongo, MD .  lactated ringers infusion, , Intravenous, Continuous, Karmen Bongo, MD, Last Rate: 75 mL/hr at 05/17/20 2210, New Bag at 05/17/20 2210 .  morphine 2 MG/ML injection 2 mg, 2 mg, Intravenous, Q2H PRN, Karmen Bongo, MD .  ondansetron [facility]) tablet 4 mg, 4 mg, Oral, Q6H PRN **OR** ondansetron (ZOFRAN) injection 4 mg, 4 mg, Intravenous, Q6H PRN, Karmen Bongo, MD .  pantoprazole (PROTONIX) EC tablet 40 mg, 40 mg, Oral, Daily, Karmen Bongo, MD,  40 mg at 05/17/20 0825 .  polyethylene glycol (MIRALAX / GLYCOLAX) packet 17 g, 17 g, Oral, Daily PRN, Karmen Bongo, MD .  sodium chloride flush (NS) 0.9 % injection 3 mL, 3 mL, Intravenous, Q12H, Karmen Bongo, MD, 3 mL at 05/17/20 0981  Facility-Administered Medications Ordered in Other Encounters:  .  sodium chloride flush (NS) 0.9 % injection 10 mL, 10 mL, Intravenous, PRN, [name], MD, 10 mL at 03/31/18 1100:  . Chlorhexidine Gluconate Cloth  6 each Topical Daily  . docusate sodium  100 mg Oral  BID  . pantoprazole  40 mg Oral Daily  . sodium chloride flush  3 mL Intravenous Q12H  :  No Known Allergies:  History reviewed. No pertinent family history.:  Social History   Socioeconomic History  . Marital status: Married    Spouse name: Not on file  . Number of children: Not on file  . Years of education: Not on file  . Highest education level: Not on file  Occupational History  . Not on file  Tobacco Use  . Smoking status: Never Smoker  . Smokeless tobacco: Never Used  Vaping Use  . Vaping Use: Never used  Substance and Sexual Activity  . Alcohol use: Yes    Comment: weekly  . Drug use: No  . Sexual activity: Not on file  Other Topics Concern  . Not on file  Social History Narrative  . Not on file   Social Determinants of Health   Financial Resource Strain: Not on file  Food Insecurity: Not on file  Transportation Needs: Not on file  Physical Activity: Not on file  Stress: Not on file  Social Connections: Not on file  Intimate Partner Violence: Not on file  :  Review of Systems  Constitutional: Negative.   HENT: Negative.   Eyes: Negative.   Respiratory: Negative.   Cardiovascular: Negative.   Gastrointestinal: Negative.   Genitourinary: Negative.   Musculoskeletal: Negative.   Skin: Negative.   Neurological: Negative.   Endo/Heme/Allergies: Negative.   Psychiatric/Behavioral: Negative.      Exam: As above   Patient Vitals for the past 24 hrs:  BP Temp Temp src Pulse Resp SpO2  05/18/20 0438 95/77 98.5 F (36.9 C) Oral 90 17 98 %  05/17/20 2340 112/84 98.4 F (36.9 C) Oral 92 20 99 %  05/17/20 2157 105/86 98.8 F (37.1 C) Oral 98 20 97 %  05/17/20 1604 113/87 98.1 F (36.7 C) Oral 98 20 100 %  05/17/20 1541 116/87 98.6 F (37 C) Oral (!) 110 (!) 22 98 %  05/17/20 1442 131/81 98.4 F (36.9 C) Oral (!) 103 20 99 %  05/17/20 1154 (!) 124/97 98.6 F (37 C) Oral 96 19 100 %  05/17/20 0816 123/86 97.8 F (36.6 C) Oral 88 14 100 %      Recent Labs    05/17/20 0737 05/18/20 0218  WBC 3.3* 3.7*  HGB 14.2 12.8*  HCT 40.6 38.5*  PLT 165 157   Recent Labs    05/17/20 0737 05/18/20 0218  NA 138 135  K 3.9 3.7  CL 101 99  CO2 28 27  GLUCOSE 96 112*  BUN 7 8  CREATININE 0.97 0.91  CALCIUM 8.9 8.7*    Blood smear review: None  Pathology: Pending on the pleural effusion    Assessment and Plan: Matthew Reynolds is a 55 year old white male.  He has history of metastatic GE junction adenocarcinoma.  Again, he has had  a very nice response to treatment.  He has been on maintenance therapy with Enhertu.  He has had Enhertu for at least 8 cycles.  Enhertu certainly is not known to cause effusions.  If anything, Enhertu is known to cause pneumonitis.  From the CT scan that had done and by lack of symptoms, I really do not see evidence of pneumonitis.  I will have to ask our pharmacist to see if she can find any information regarding Enhertu and effusions.  He will be interesting to see what the pleural effusion cytology is.  If this is malignant, that we are truly are in trouble.  This would be a very unusual way for GE junction cancer to recur.  He does look good.  He feels good.  I suspect he will probably discharged today.  I have no problems with this.  We can easily follow-up as an outpatient.  Again, I am puzzled as to what the source of this effusion is.  He does not have congestive heart failure.  He has good cardiac function for I can tell on the echocardiogram.  He is always been healthy.  He is always been very motivated and has been incredibly proactive with respect to his cancer care.  He has had incredible real support from his family.  He has always been a true inspiration for all of [place] in the [place].  My nurses really like him and are really praying hard for him.  I appreciate the outstanding care that he is getting from all the staff up on [address].  Lattie Haw, MD  1 Corinthians 1:9

## 2020-05-18 NOTE — Plan of Care (Signed)
  Problem: Clinical Measurements: Goal: Will remain free from infection Outcome: Progressing Goal: Diagnostic test results will improve Outcome: Progressing Goal: Cardiovascular complication will be avoided Outcome: Progressing   

## 2020-05-18 NOTE — Consult Note (Signed)
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  The patient was undergoing a routine imaging when he was noted to have these findings.  A cardiac work up was negative as his EF was 55-60%.  Pulmonary work up with a thoracentesis showed that the fluid was transudative.  There was a reading with his CT scan and RUQ U/S showing a finely nodular liver consistent with cirrhosis and mild ascites.  As a result GI was consulted for further evaluation and management.  There is no prior history of any infectious or behavioral risk factors for cirrhosis.  Review of the liver enzymes did not show any abnormalities outside of intermittently and mildly elevated AP of late.  There is currently no evidence of any metastatic disease and he is currently receiving Enhertu and maintenance therapy.  Past Medical History:  Diagnosis Date  . Adenocarcinoma of cardio-esophageal junction ([place]) 12/10/2017  . Goals of care, counseling/discussion 12/10/2017  . Iron deficiency anemia due to chronic blood loss 12/11/2017  . Malignant neoplasm metastatic to liver ([place]) 12/10/2017  . 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 CYST EXCISION Left 01/15/2016   Procedure: EXCISION SEBACEOUS CYST LEFT UPPER BACK;  Surgeon: Georganna Skeans, MD;  Location: [facility];  Service: General;  Laterality: Left;  . IR IMAGING GUIDED PORT INSERTION  12/15/2017    History reviewed. No pertinent family history.  Social History:  reports that he has never smoked. He has never used smokeless tobacco. He reports current alcohol use.  He reports that he does not use drugs.  Allergies: No Known Allergies  Medications:  Scheduled: . Chlorhexidine Gluconate Cloth  6 each Topical Daily  . docusate sodium  100 mg Oral BID  . enoxaparin (LOVENOX) injection  40 mg Subcutaneous Daily  . pantoprazole  40 mg Oral Daily  . sodium chloride flush  3 mL Intravenous Q12H   Continuous:   Results for orders placed or performed during the hospital encounter of 05/16/20 (from the past 24 hour(s))  Pleural Fluid cell count with differential     Status: Abnormal   Collection Time: 05/17/20  3:48 PM  Result Value Ref Range   Fluid Type-FCT PLEURAL    Color, Fluid YELLOW    Appearance, Fluid HAZY (A) CLEAR   Total Nucleated Cell Count, Fluid 108 0 - 1,000 cu mm   Neutrophil Count, Fluid 9 0 - 25 %   Lymphs, Fluid 64 %   Monocyte-Macrophage-Serous Fluid 27 (L) 50 - 90 %   Eos, Fluid 0 %  Glucose, Pleural Fluid     Status: None   Collection Time: 05/17/20  3:48 PM  Result Value Ref Range   Glucose, Fluid 114 mg/dL   Fluid Type-FGLU PLEURAL   Protein, Pleural Fluid     Status: None   Collection Time: 05/17/20  3:48 PM  Result Value Ref Range   Total protein, fluid <3.0 g/dL   Fluid Type-FTP PLEURAL   Lactate dehydrogenase, Pleural Fluid     Status: Abnormal   Collection Time: 05/17/20  3:48 PM  Result Value Ref Range   LD, Fluid 101 (H) 3 - 23  U/L   Fluid Type-FLDH PLEURAL   Body fluid culture w Gram Stain     Status: None (Preliminary result)   Collection Time: 05/17/20  3:48 PM   Specimen: Body Fluid  Result Value Ref Range   Specimen Description FLUID PLEURAL LEFT    Special Requests NONE    Gram Stain      FEW WBC PRESENT, PREDOMINANTLY MONONUCLEAR NO ORGANISMS SEEN Performed at [facility], [address], [place], [place] [address]    Culture PENDING    Report Status PENDING   Albumin, Pleural Fluid     Status: Abnormal   Collection Time: 05/17/20  4:16 PM  Result Value Ref Range   Albumin 2.8 (L) 3.5  - 5.0 g/dL  Protein, total     Status: Abnormal   Collection Time: 05/17/20  4:16 PM  Result Value Ref Range   Total Protein 5.9 (L) 6.5 - 8.1 g/dL  Lactate dehydrogenase     Status: None   Collection Time: 05/17/20  4:16 PM  Result Value Ref Range   LDH 127 98 - 192 U/L  HIV Antibody (routine testing w rflx)     Status: None   Collection Time: 05/18/20  2:18 AM  Result Value Ref Range   HIV Screen 4th Generation wRfx Non Reactive Non Reactive  Basic metabolic panel     Status: Abnormal   Collection Time: 05/18/20  2:18 AM  Result Value Ref Range   Sodium 135 135 - 145 mmol/L   Potassium 3.7 3.5 - 5.1 mmol/L   Chloride 99 98 - 111 mmol/L   CO2 27 22 - 32 mmol/L   Glucose, Bld 112 (H) 70 - 99 mg/dL   BUN 8 6 - 20 mg/dL   Creatinine, Ser 0.91 0.61 - 1.24 mg/dL   Calcium 8.7 (L) 8.9 - 10.3 mg/dL   GFR, Estimated >60 >60 mL/min   Anion gap 9 5 - 15  CBC     Status: Abnormal   Collection Time: 05/18/20  2:18 AM  Result Value Ref Range   WBC 3.7 (L) 4.0 - 10.5 K/uL   RBC 4.14 (L) 4.22 - 5.81 MIL/uL   Hemoglobin 12.8 (L) 13.0 - 17.0 g/dL   HCT 38.5 (L) 39.0 - 52.0 %   MCV 93.0 80.0 - 100.0 fL   MCH 30.9 26.0 - 34.0 pg   MCHC 33.2 30.0 - 36.0 g/dL   RDW 14.3 11.5 - 15.5 %   Platelets 157 150 - 400 K/uL   nRBC 0.0 0.0 - 0.2 %  Urinalysis, Routine w reflex microscopic Urine, Clean Catch     Status: None   Collection Time: 05/18/20 10:33 AM  Result Value Ref Range   Color, Urine YELLOW YELLOW   APPearance CLEAR CLEAR   Specific Gravity, Urine 1.013 1.005 - 1.030   pH 6.0 5.0 - 8.0   Glucose, UA NEGATIVE NEGATIVE mg/dL   Hgb urine dipstick NEGATIVE NEGATIVE   Bilirubin Urine NEGATIVE NEGATIVE   Ketones, ur NEGATIVE NEGATIVE mg/dL   Protein, ur NEGATIVE NEGATIVE mg/dL   Nitrite NEGATIVE NEGATIVE   Leukocytes,Ua NEGATIVE NEGATIVE     DG Chest 2 View  Result Date: 05/16/2020 CLINICAL DATA:  Chest tightness x1 week. EXAM: CHEST - 2 VIEW COMPARISON:  None. FINDINGS: A  right-sided venous Port-A-Cath is seen with its distal tip noted at the junction of the superior vena cava and right atrium. Moderate to marked severity atelectasis and/or infiltrate is seen within the left  lung base. There is a very small right pleural effusion. A small left pleural effusion is also seen. No pneumothorax is identified. The heart size and mediastinal contours are within normal limits. The visualized skeletal structures are unremarkable. IMPRESSION: 1. Moderate to marked severity left basilar atelectasis and/or infiltrate. 2. Bilateral pleural effusions, left greater than right. Electronically Signed   By: Virgina Norfolk M.D.   On: 05/16/2020 17:38   DG CHEST PORT 1 VIEW  Result Date: 05/17/2020 CLINICAL DATA:  Pleural effusion EXAM: PORTABLE CHEST 1 VIEW COMPARISON:  May 16, 2020 FINDINGS: The heart size and mediastinal contours are within normal limits. A trace left pleural effusion is present, decreased in size from the prior exam. A right-sided MediPort catheter seen with the tip at the superior cavoatrial junction. There is prominence of the central pulmonary vasculature. No acute osseous abnormality. IMPRESSION: . Mild pulmonary vascular congestion Interval decreased size in the left pleural effusion Electronically Signed   By: Prudencio Pair M.D.   On: 05/17/2020 17:34   ECHOCARDIOGRAM LIMITED  Result Date: 05/17/2020    ECHOCARDIOGRAM LIMITED REPORT   Patient Name:   AVINASH MALTOS Date of Exam: 05/17/2020 Medical Rec #:  [id number]      Height:       69.0 in Accession #:    [id number]     Weight:       146.7 lb Date of Birth:  [birth year]       BSA:          1.811 m Patient Age:    48 years       BP:           123/86 mmHg Patient Gender: M              HR:           81 bpm. Exam Location:  Inpatient Procedure: Limited Echo, Limited Color Doppler and Cardiac Doppler Indications:    Pericardial effusion I31.3  History:        Patient has prior history of Echocardiogram examinations, most                  recent 01/03/2020.  Sonographer:    Bernadene Person RDCS Referring Phys: [place]  1. Left ventricular ejection fraction, by estimation, is 55 to 60%. The left ventricle has normal function. The left ventricle has no regional wall motion abnormalities. Left ventricular diastolic parameters are consistent with Grade I diastolic dysfunction (impaired relaxation).  2. Right ventricular systolic function is mildly reduced. The right ventricular size is normal. There is normal pulmonary artery systolic pressure. The estimated right ventricular systolic pressure is 85.0 mmHg.  3. The mitral valve is normal in structure. No evidence of mitral valve regurgitation. No evidence of mitral stenosis.  4. The aortic valve is tricuspid. Aortic valve regurgitation is not visualized. No aortic stenosis is present.  5. The inferior vena cava is normal in size with greater than 50% respiratory variability, suggesting right atrial pressure of 3 mmHg.  6. There is a pericardial effusion present, primarily subcostally and adjacent to the RV. The effusion is large subcostally but small around the rest of the heart. The IVC is small, < 25% respirophasic variation in the mitral E inflow velocity, and there does not appear to be RV diastolic collapse. Tamponade does not appear to be present. FINDINGS  Left Ventricle: Left ventricular ejection fraction, by estimation, is 55 to 60%. The left ventricle has normal function. The  left ventricle has no regional wall motion abnormalities. The left ventricular internal cavity size was normal in size. Left ventricular diastolic parameters are consistent with Grade I diastolic dysfunction (impaired relaxation). Right Ventricle: The right ventricular size is normal. Right ventricular systolic function is mildly reduced. There is normal pulmonary artery systolic pressure. The tricuspid regurgitant velocity is 1.91 m/s, and with an assumed right atrial pressure of  3  mmHg, the estimated right ventricular systolic pressure is 56.8 mmHg. Left Atrium: Left atrial size was normal in size. Right Atrium: Right atrial size was normal in size. Pericardium: There is a pericardial effusion present, primarily subcostally and adjacent to the RV. The effusion is large subcostally but small around the rest of the heart. The IVC is small, < 25% respirophasic variation in the mitral E inflow velocity,  and there does not appear to be RV diastolic collapse. Tamponade does not appear to be present. Mitral Valve: The mitral valve is normal in structure. No evidence of mitral valve stenosis. Tricuspid Valve: The tricuspid valve is normal in structure. Tricuspid valve regurgitation is trivial. Aortic Valve: The aortic valve is tricuspid. Aortic valve regurgitation is not visualized. No aortic stenosis is present. Pulmonic Valve: The pulmonic valve was normal in structure. Pulmonic valve regurgitation is not visualized. Venous: The inferior vena cava is normal in size with greater than 50% respiratory variability, suggesting right atrial pressure of 3 mmHg. IAS/Shunts: No atrial level shunt detected by color flow Doppler. LEFT VENTRICLE PLAX 2D LVIDd:         4.40 cm LVIDs:         3.10 cm LV PW:         0.70 cm LV IVS:        0.70 cm  LEFT ATRIUM         Index LA diam:    2.70 cm 1.49 cm/m  TRICUSPID VALVE TR Peak grad:   14.6 mmHg TR Vmax:        191.00 cm/s Loralie Champagne MD Electronically signed by Loralie Champagne MD Signature Date/Time: 05/17/2020/9:48:07 AM    Final    US Abdomen Limited RUQ (LIVER/GB)  Result Date: 05/18/2020 CLINICAL DATA:  Cirrhosis.  Ascites. EXAM: ULTRASOUND ABDOMEN LIMITED RIGHT UPPER QUADRANT COMPARISON:  CT 05/16/2020. FINDINGS: Gallbladder: Gallbladder is contracted. No definite gallstones noted. Nodular gallbladder wall with ring down suggesting adenomyomatosis. Gallbladder wall thickness 7 mm. This may be related to adenomyomatosis, hypoproteinemic state, or  cholecystitis. Negative Murphy sign. Common bile duct: Diameter: 3.4 mm Liver: Nodular hepatic contour suggesting cirrhosis. No focal hepatic abnormality identified. Portal vein is patent on color Doppler imaging with normal direction of blood flow towards the liver. Other: Mild ascites.  Bilateral pleural effusions are noted. IMPRESSION: 1. Gallbladder is contracted. No definite gallstones noted. Nodular gallbladder wall with ring down suggesting adenomyomatosis. Gallbladder wall thickness 7 mm. Gallbladder wall thickening may be related to adenomyomatosis, hypoproteinemic state, or cholecystitis. Negative Murphy sign. No biliary distention. 2. Nodular hepatic contour suggesting cirrhosis. No focal hepatic abnormality identified. 3.  Mild ascites.  Bilateral pleural effusions noted. Electronically Signed   By: Marcello Moores  Register   On: 05/18/2020 10:00    ROS:  As stated above in the HPI otherwise negative.  Blood pressure 113/84, pulse 90, temperature 98.1 F (36.7 C), temperature source Oral, resp. rate 20, height 5\' 9"  (1.753 m), weight 66.5 kg, SpO2 100 %.    PE: Gen: NAD, Alert and Oriented HEENT:  [place]/AT, EOMI Neck: Supple, no LAD Lungs:  Decreased breath sounds in the lung bases bilaterally CV: RRR without M/G/R, muted hear tones ABD: Soft, NTND, +BS Ext: No C/C/E  Assessment/Plan: 1) Mild cirrhosis on imaging. 2) Mild ascites. 3) Bilateral pleural effusions L>R. 4) Pericardial effusion.   The patient is clinically stable.  There is the finding of cirrhosis on imaging.  His albumin has declined of late, which can be consistent with this issue.  The only definitive diagnoses at this stage is to perform a liver biopsy, however, this will not necessarily change any current management.  It is less common to see a pericardial effusion and a L>R pleural effusion with cirrhosis.  There does not appear to be any overt risk factors for cirrhosis.  His liver enzymes, outside of the AP, were normal.   After discussion with [name] it is possible that the chemotherapy caused the fibrosis.  The current level of ascites is too small to perform a paracentesis.  Plan: 1) Lasix 40 mg QD and Spironolactone 100 mg QD. 2) 2 gram sodium diet. 3) Follow up with Dr. Collene Mares in 2 weeks. 4) Okay to D/C home today.  Daevon Holdren D 05/18/2020, 12:58 PM

## 2020-05-18 NOTE — Progress Notes (Signed)
PROGRESS NOTE    Matthew Reynolds  CVE:[id number] DOB: [birth year] DOA: 05/16/2020 PCP: Cari Caraway, MD  Brief Narrative: 55 year old male with history of metastatic GE junction adenocarcinoma in remission, previously diagnosed liver mets 3 years ago has been treated with chemo and radiation, currently on maintenance Enhertu every 3 weeks started in April 21, had a surveillance CTs on 3/1 which showed new small pericardial effusion, moderate bilateral effusions and ascites and was advised to come to the ER for this.   Assessment & Plan:   Bilateral pleural effusions Ascites Decompensated liver cirrhosis -Underwent paracentesis yesterday, fluid suggestive of transudative effusion despite mildly elevated LDH, appreciate pulmonary input -Follow-up cytology of pleural fluid -Echo is within normal limits, liver nodularity noted on CT, right upper quadrant ultrasound was concerning for cirrhosis, etiology is unclear, no history of hepatitis, alcoholism etc -Previously did have liver metastasis, could this cause progression to cirrhosis -Will request gastroenterology input -Will need low-dose diuretics and low-salt diet discharge -Repeat echo in 2 months  Adenocarcinoma GE junction with history of liver mets -On maintenance Enhertu every 2 weeks, followed by [name]  DVT prophylaxis: Add Lovenox Code Status: Full code Family Communication: Wife at bedside Disposition Plan:  Status is: Observation  The patient will require care spanning > 2 midnights and should be moved to inpatient because: Ongoing diagnostic testing needed not appropriate for outpatient work up  Dispo: The patient is from: Home              Anticipated d/c is to: Home              Patient currently is not medically stable to d/c.   Difficult to place patient No  Consultants:   Pulmonary, oncology   Procedures: Thoracentesis  Antimicrobials:    Subjective: -Feels well, no complaints  Objective: Vitals:    05/17/20 2157 05/17/20 2340 05/18/20 0438 05/18/20 0749  BP: 105/86 112/84 95/77 92/64   Pulse: 98 92 90 85  Resp: 20 20 17 20   Temp: 98.8 F (37.1 C) 98.4 F (36.9 C) 98.5 F (36.9 C) 98.1 F (36.7 C)  TempSrc: Oral Oral Oral Oral  SpO2: 97% 99% 98% 96%  Weight:      Height:        Intake/Output Summary (Last 24 hours) at 05/18/2020 1140 Last data filed at 05/18/2020 0234 Gross per 24 hour  Intake 1018.84 ml  Output -  Net 1018.84 ml   Filed Weights   05/16/20 1710 05/16/20 2228  Weight: 65.8 kg 66.5 kg    Examination:  General exam: Pleasant male sitting up in bed, AAOx3, no distress HEENT: No JVD CVS: S1-S2, regular rate rhythm Lungs: Decreased breath sounds bases otherwise clear Abdomen: Soft, nontender, minimal fluid thrill, bowel sounds present Extremities: No edema  Psychiatry: Judgement and insight appear normal. Mood & affect appropriate.     Data Reviewed:   CBC: Recent Labs  Lab 05/16/20 1712 05/17/20 0737 05/18/20 0218  WBC 3.8* 3.3* 3.7*  HGB 13.9 14.2 12.8*  HCT 41.1 40.6 38.5*  MCV 93.6 92.3 93.0  PLT 166 165 025   Basic Metabolic Panel: Recent Labs  Lab 05/16/20 1712 05/17/20 0737 05/18/20 0218  NA 134* 138 135  K 3.4* 3.9 3.7  CL 99 101 99  CO2 27 28 27   GLUCOSE 114* 96 112*  BUN 10 7 8   CREATININE 0.80 0.97 0.91  CALCIUM 8.4* 8.9 8.7*  MG  --  1.9  --    GFR: Estimated  Creatinine Clearance: 87.3 mL/min (by C-G formula based on SCr of 0.91 mg/dL). Liver Function Tests: Recent Labs  Lab 05/17/20 0737 05/17/20 1616  AST 39  --   ALT 29  --   ALKPHOS 165*  --   BILITOT 1.1  --   PROT 6.0* 5.9*  ALBUMIN 2.9* 2.8*   No results for input(s): LIPASE, AMYLASE in the last 168 hours. No results for input(s): AMMONIA in the last 168 hours. Coagulation Profile: Recent Labs  Lab 05/16/20 1743  INR 1.1   Cardiac Enzymes: No results for input(s): CKTOTAL, CKMB, CKMBINDEX, TROPONINI in the last 168 hours. BNP (last 3  results) No results for input(s): PROBNP in the last 8760 hours. HbA1C: No results for input(s): HGBA1C in the last 72 hours. CBG: No results for input(s): GLUCAP in the last 168 hours. Lipid Profile: No results for input(s): CHOL, HDL, LDLCALC, TRIG, CHOLHDL, LDLDIRECT in the last 72 hours. Thyroid Function Tests: No results for input(s): TSH, T4TOTAL, FREET4, T3FREE, THYROIDAB in the last 72 hours. Anemia Panel: No results for input(s): VITAMINB12, FOLATE, FERRITIN, TIBC, IRON, RETICCTPCT in the last 72 hours. Urine analysis:    Component Value Date/Time   COLORURINE YELLOW 11/13/2017 2256   APPEARANCEUR CLEAR 11/13/2017 2256   LABSPEC 1.015 11/13/2017 2256   PHURINE 8.5 (H) 11/13/2017 2256   GLUCOSEU NEGATIVE 11/13/2017 2256   HGBUR NEGATIVE 11/13/2017 2256   BILIRUBINUR NEGATIVE 11/13/2017 2256   KETONESUR >80 (A) 11/13/2017 2256   PROTEINUR NEGATIVE 11/13/2017 2256   NITRITE NEGATIVE 11/13/2017 2256   LEUKOCYTESUR NEGATIVE 11/13/2017 2256   Sepsis Labs: @LABRCNTIP (procalcitonin:4,lacticidven:4)  ) Recent Results (from the past 240 hour(s))  Resp Panel by RT-PCR (Flu A&B, Covid) Nasopharyngeal Swab     Status: None   Collection Time: 05/16/20  5:43 PM   Specimen: Nasopharyngeal Swab; Nasopharyngeal(NP) swabs in vial transport medium  Result Value Ref Range Status   SARS Coronavirus 2 by RT PCR NEGATIVE NEGATIVE Final    Comment: (NOTE) SARS-CoV-2 target nucleic acids are NOT DETECTED.  The SARS-CoV-2 RNA is generally detectable in upper respiratory specimens during the acute phase of infection. The lowest concentration of SARS-CoV-2 viral copies this assay can detect is 138 copies/mL. A negative result does not preclude SARS-Cov-2 infection and should not be used as the sole basis for treatment or other patient management decisions. A negative result may occur with  improper specimen collection/handling, submission of specimen other than nasopharyngeal swab,  presence of viral mutation(s) within the areas targeted by this assay, and inadequate number of viral copies(<138 copies/mL). A negative result must be combined with clinical observations, patient history, and epidemiological information. The expected result is Negative.  Fact Sheet for Patients:  EntrepreneurPulse.com.au  Fact Sheet for Healthcare Providers:  IncredibleEmployment.be  This test is no t yet approved or cleared by the [place] FDA and  has been authorized for detection and/or diagnosis of SARS-CoV-2 by FDA under an Emergency Use Authorization (EUA). This EUA will remain  in effect (meaning this test can be used) for the duration of the COVID-19 declaration under Section 564(b)(1) of the Act, 21 U.S.C.section 360bbb-3(b)(1), unless the authorization is terminated  or revoked sooner.       Influenza A by PCR NEGATIVE NEGATIVE Final   Influenza B by PCR NEGATIVE NEGATIVE Final    Comment: (NOTE) The Xpert Xpress SARS-CoV-2/FLU/RSV plus assay is intended as an aid in the diagnosis of influenza from Nasopharyngeal swab specimens and should not be used as a  sole basis for treatment. Nasal washings and aspirates are unacceptable for Xpert Xpress SARS-CoV-2/FLU/RSV testing.  Fact Sheet for Patients: EntrepreneurPulse.com.au  Fact Sheet for Healthcare Providers: IncredibleEmployment.be  This test is not yet approved or cleared by the [place] FDA and has been authorized for detection and/or diagnosis of SARS-CoV-2 by FDA under an Emergency Use Authorization (EUA). This EUA will remain in effect (meaning this test can be used) for the duration of the COVID-19 declaration under Section 564(b)(1) of the Act, 21 U.S.C. section 360bbb-3(b)(1), unless the authorization is terminated or revoked.  Performed at [facility], [address] [place], [place] [address]   Body fluid  culture w Gram Stain     Status: None (Preliminary result)   Collection Time: 05/17/20  3:48 PM   Specimen: Body Fluid  Result Value Ref Range Status   Specimen Description FLUID PLEURAL LEFT  Final   Special Requests NONE  Final   Gram Stain   Final    FEW WBC PRESENT, PREDOMINANTLY MONONUCLEAR NO ORGANISMS SEEN Performed at [facility], [address] [place], [place] [address]    Culture PENDING  Incomplete   Report Status PENDING  Incomplete         Radiology Studies: DG Chest 2 View  Result Date: 05/16/2020 CLINICAL DATA:  Chest tightness x1 week. EXAM: CHEST - 2 VIEW COMPARISON:  None. FINDINGS: A right-sided venous Port-A-Cath is seen with its distal tip noted at the junction of the superior vena cava and right atrium. Moderate to marked severity atelectasis and/or infiltrate is seen within the left lung base. There is a very small right pleural effusion. A small left pleural effusion is also seen. No pneumothorax is identified. The heart size and mediastinal contours are within normal limits. The visualized skeletal structures are unremarkable. IMPRESSION: 1. Moderate to marked severity left basilar atelectasis and/or infiltrate. 2. Bilateral pleural effusions, left greater than right. Electronically Signed   By: Virgina Norfolk M.D.   On: 05/16/2020 17:38   DG CHEST PORT 1 VIEW  Result Date: 05/17/2020 CLINICAL DATA:  Pleural effusion EXAM: PORTABLE CHEST 1 VIEW COMPARISON:  May 16, 2020 FINDINGS: The heart size and mediastinal contours are within normal limits. A trace left pleural effusion is present, decreased in size from the prior exam. A right-sided MediPort catheter seen with the tip at the superior cavoatrial junction. There is prominence of the central pulmonary vasculature. No acute osseous abnormality. IMPRESSION: . Mild pulmonary vascular congestion Interval decreased size in the left pleural effusion Electronically Signed   By: Prudencio Pair M.D.   On:  05/17/2020 17:34   ECHOCARDIOGRAM LIMITED  Result Date: 05/17/2020    ECHOCARDIOGRAM LIMITED REPORT   Patient Name:   Matthew Reynolds Date of Exam: 05/17/2020 Medical Rec #:  [id number]      Height:       69.0 in Accession #:    [id number]     Weight:       146.7 lb Date of Birth:  May 08, 1965       BSA:          1.811 m Patient Age:    30 years       BP:           123/86 mmHg Patient Gender: M              HR:           81 bpm. Exam Location:  Inpatient Procedure:  Limited Echo, Limited Color Doppler and Cardiac Doppler Indications:    Pericardial effusion I31.3  History:        Patient has prior history of Echocardiogram examinations, most                 recent 01/03/2020.  Sonographer:    Bernadene Person RDCS Referring Phys: [place]  1. Left ventricular ejection fraction, by estimation, is 55 to 60%. The left ventricle has normal function. The left ventricle has no regional wall motion abnormalities. Left ventricular diastolic parameters are consistent with Grade I diastolic dysfunction (impaired relaxation).  2. Right ventricular systolic function is mildly reduced. The right ventricular size is normal. There is normal pulmonary artery systolic pressure. The estimated right ventricular systolic pressure is 95.6 mmHg.  3. The mitral valve is normal in structure. No evidence of mitral valve regurgitation. No evidence of mitral stenosis.  4. The aortic valve is tricuspid. Aortic valve regurgitation is not visualized. No aortic stenosis is present.  5. The inferior vena cava is normal in size with greater than 50% respiratory variability, suggesting right atrial pressure of 3 mmHg.  6. There is a pericardial effusion present, primarily subcostally and adjacent to the RV. The effusion is large subcostally but small around the rest of the heart. The IVC is small, < 25% respirophasic variation in the mitral E inflow velocity, and there does not appear to be RV diastolic collapse. Tamponade does not  appear to be present. FINDINGS  Left Ventricle: Left ventricular ejection fraction, by estimation, is 55 to 60%. The left ventricle has normal function. The left ventricle has no regional wall motion abnormalities. The left ventricular internal cavity size was normal in size. Left ventricular diastolic parameters are consistent with Grade I diastolic dysfunction (impaired relaxation). Right Ventricle: The right ventricular size is normal. Right ventricular systolic function is mildly reduced. There is normal pulmonary artery systolic pressure. The tricuspid regurgitant velocity is 1.91 m/s, and with an assumed right atrial pressure of  3 mmHg, the estimated right ventricular systolic pressure is 21.3 mmHg. Left Atrium: Left atrial size was normal in size. Right Atrium: Right atrial size was normal in size. Pericardium: There is a pericardial effusion present, primarily subcostally and adjacent to the RV. The effusion is large subcostally but small around the rest of the heart. The IVC is small, < 25% respirophasic variation in the mitral E inflow velocity,  and there does not appear to be RV diastolic collapse. Tamponade does not appear to be present. Mitral Valve: The mitral valve is normal in structure. No evidence of mitral valve stenosis. Tricuspid Valve: The tricuspid valve is normal in structure. Tricuspid valve regurgitation is trivial. Aortic Valve: The aortic valve is tricuspid. Aortic valve regurgitation is not visualized. No aortic stenosis is present. Pulmonic Valve: The pulmonic valve was normal in structure. Pulmonic valve regurgitation is not visualized. Venous: The inferior vena cava is normal in size with greater than 50% respiratory variability, suggesting right atrial pressure of 3 mmHg. IAS/Shunts: No atrial level shunt detected by color flow Doppler. LEFT VENTRICLE PLAX 2D LVIDd:         4.40 cm LVIDs:         3.10 cm LV PW:         0.70 cm LV IVS:        0.70 cm  LEFT ATRIUM         Index LA  diam:    2.70 cm 1.49 cm/m  TRICUSPID VALVE TR Peak grad:   14.6 mmHg TR Vmax:        191.00 cm/s Loralie Champagne MD Electronically signed by Loralie Champagne MD Signature Date/Time: 05/17/2020/9:48:07 AM    Final    US Abdomen Limited RUQ (LIVER/GB)  Result Date: 05/18/2020 CLINICAL DATA:  Cirrhosis.  Ascites. EXAM: ULTRASOUND ABDOMEN LIMITED RIGHT UPPER QUADRANT COMPARISON:  CT 05/16/2020. FINDINGS: Gallbladder: Gallbladder is contracted. No definite gallstones noted. Nodular gallbladder wall with ring down suggesting adenomyomatosis. Gallbladder wall thickness 7 mm. This may be related to adenomyomatosis, hypoproteinemic state, or cholecystitis. Negative Murphy sign. Common bile duct: Diameter: 3.4 mm Liver: Nodular hepatic contour suggesting cirrhosis. No focal hepatic abnormality identified. Portal vein is patent on color Doppler imaging with normal direction of blood flow towards the liver. Other: Mild ascites.  Bilateral pleural effusions are noted. IMPRESSION: 1. Gallbladder is contracted. No definite gallstones noted. Nodular gallbladder wall with ring down suggesting adenomyomatosis. Gallbladder wall thickness 7 mm. Gallbladder wall thickening may be related to adenomyomatosis, hypoproteinemic state, or cholecystitis. Negative Murphy sign. No biliary distention. 2. Nodular hepatic contour suggesting cirrhosis. No focal hepatic abnormality identified. 3.  Mild ascites.  Bilateral pleural effusions noted. Electronically Signed   By: Marcello Moores  Register   On: 05/18/2020 10:00        Scheduled Meds: . Chlorhexidine Gluconate Cloth  6 each Topical Daily  . docusate sodium  100 mg Oral BID  . pantoprazole  40 mg Oral Daily  . sodium chloride flush  3 mL Intravenous Q12H   Continuous Infusions: . lactated ringers 75 mL/hr at 05/17/20 2210     LOS: 1 day    Time spent: 66min  Domenic Polite, MD [facility]  05/18/2020, 11:40 AM

## 2020-05-18 NOTE — Progress Notes (Signed)
NAME:  Matthew Reynolds, MRN:  [id number], DOB:  1966-01-29, LOS: 1 ADMISSION DATE:  05/16/2020, CONSULTATION DATE:  05/17/2020 REFERRING MD:  Dr. Lorin Mercy, CHIEF COMPLAINT:  Pleural effusion  Brief History:  55 year old male with hx of metastatic GE junction adenocarcinoma in remission admitted with new pericardial effusion, bilateral moderate pleural effusions, and ascites found on surveillance CT.  PCCM consulted for pleural effusions and possible diagnostic thoracentesis.   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 on current maintenance Enhertu every three weeks s/p 12 cycle started on 06/29/2019 presenting after surveillance CT on 3/1 showed a new small pericardial effusion, bilateral moderate pleural effusions left greater than right, and ascites.  He was told to come to ER for further evaluation.  TRH to admit.    He denies any current chest pain, shortness of breath, fever, chills, cough or other complaints.  Is a never smoker, no ETOH or illicit drug abuse.  Had reported some mild exertional chest pain since resolved.  He has been afebrile and hemodynamically stable.  BNP and troponin negative.  Mild leukopenia and K 3.4 otherwise normal lab findings.  Was started on lasix.  Pulmonary asked to see for pleural effusions and possible diagnostic thoracentesis.  He is not on any anticoagulants or aspirin.   Past Medical History:  GE junction adenocarcinoma diagnosed 03/03/2019 with liver mets, foot drop left foot  Significant Hospital Events:  3/2 admitted [facility]  Consults:  Cardiology Pulmonary   Procedures:   Significant Diagnostic Tests:  3/1 CT a/p >> 1. No metastatic disease in the abdomen pelvis. 2. New intraperitoneal free fluid in the abdomen pelvis. 3. With new pericardial effusion, pleural effusions, and intraperitoneal free fluid  query cardiac dysfunction or cardiac toxicity.  3/1 CT chest >> 1. New pericardial effusion and moderate bilateral pleural effusions. 2. No evidence of esophageal cancer recurrence. Stable thickening through the distal esophagus. 3. No pulmonary metastasis or mediastinal adenopathy  Micro Data:  3/1 SARS/ Flu >> neg  Antimicrobials:  n/a  Interim History / Subjective:  No overnight issues.   Objective   Blood pressure 92/64, pulse 85, temperature 98.1 F (36.7 C), temperature source Oral, resp. rate 20, height _0  (1.753 m), weight 66.5 kg, SpO2 96 %.        Intake/Output Summary (Last 24 hours) at 05/18/2020 1118 Last data filed at 05/18/2020 0234 Gross per 24 hour  Intake 1018.84 ml  Output -  Net 1018.84 ml   Filed Weights   05/16/20 1710 05/16/20 2228  Weight: 65.8 kg 66.5 kg   Examination: General:  Well appearing thin man, no distress HEENT: MM pink/moist Neuro: normal speech, moves all 4 extremities, no focal asymmetry CV: s1s2 , no m/r/g PULM:  Diminished bilateral lung bases GI: soft, bsx4 active  Extremities: warm/dry, no edema  Skin: no rashes or lesions  Resolved Hospital Problem list    Assessment & Plan:   Bilateral pleural effusions, L > R with ascites - thoracentesis done 3/2. Labs reviewed. transudative effusion based on protein. Mildly elevated pleural fluid LDH but relatively hypocellular fluid with mild lymphocyte predominance.  - based on relatively normal echo and CT findings which suggest nodular liver, I ordered an ultrasound this morning which confirms cirrhosis - suspect this is the etiology of his effusions and ascites. Agree with GI consult. He denies  etoh use, hepatitis, or family history of liver dz.  - will await remaining pleural fluid studies including cytology  Cirrhosis - new diagnosis  GE junction adenocarcinoma with mets to liver with ascites  - on maintenance Enhertu every three weeks s/p 12 cycle started on 06/29/2019 - per  primary  - daily lasix - nodular liver noted on scan - denies any etoh use or previous hepatitis diagnosis.  - ongoing outpatient f/u with [name]  Will follow for pleural studies returning.   Lenice Llamas, MD Pulmonary and [place] Pager: see AMION Office:[phone]

## 2020-05-21 LAB — BODY FLUID CULTURE W GRAM STAIN: Culture: NO GROWTH

## 2020-05-23 ENCOUNTER — Encounter: Payer: Self-pay | Admitting: Hematology & Oncology

## 2020-05-23 ENCOUNTER — Inpatient Hospital Stay (HOSPITAL_BASED_OUTPATIENT_CLINIC_OR_DEPARTMENT_OTHER): Payer: BC Managed Care – PPO | Admitting: Hematology & Oncology

## 2020-05-23 ENCOUNTER — Other Ambulatory Visit: Payer: Self-pay

## 2020-05-23 ENCOUNTER — Inpatient Hospital Stay: Payer: BC Managed Care – PPO

## 2020-05-23 ENCOUNTER — Other Ambulatory Visit: Payer: Self-pay | Admitting: Hematology & Oncology

## 2020-05-23 ENCOUNTER — Inpatient Hospital Stay: Payer: BC Managed Care – PPO | Attending: Hematology & Oncology

## 2020-05-23 VITALS — BP 119/85 | HR 90 | Temp 99.1°F | Resp 18 | Wt 146.0 lb

## 2020-05-23 DIAGNOSIS — Z7189 Other specified counseling: Secondary | ICD-10-CM

## 2020-05-23 DIAGNOSIS — C160 Malignant neoplasm of cardia: Secondary | ICD-10-CM | POA: Diagnosis not present

## 2020-05-23 DIAGNOSIS — Z79899 Other long term (current) drug therapy: Secondary | ICD-10-CM | POA: Insufficient documentation

## 2020-05-23 DIAGNOSIS — C799 Secondary malignant neoplasm of unspecified site: Secondary | ICD-10-CM | POA: Diagnosis not present

## 2020-05-23 DIAGNOSIS — J910 Malignant pleural effusion: Secondary | ICD-10-CM | POA: Diagnosis not present

## 2020-05-23 LAB — CMP (CANCER CENTER ONLY)
ALT: 30 U/L (ref 0–44)
AST: 34 U/L (ref 15–41)
Albumin: 3.4 g/dL — ABNORMAL LOW (ref 3.5–5.0)
Alkaline Phosphatase: 208 U/L — ABNORMAL HIGH (ref 38–126)
Anion gap: 5 (ref 5–15)
BUN: 13 mg/dL (ref 6–20)
CO2: 30 mmol/L (ref 22–32)
Calcium: 9 mg/dL (ref 8.9–10.3)
Chloride: 100 mmol/L (ref 98–111)
Creatinine: 0.9 mg/dL (ref 0.61–1.24)
GFR, Estimated: >60 mL/min (ref >60)
Glucose, Bld: 138 mg/dL — ABNORMAL HIGH (ref 70–99)
Potassium: 3.8 mmol/L (ref 3.5–5.1)
Sodium: 135 mmol/L (ref 135–145)
Total Bilirubin: 0.7 mg/dL (ref 0.3–1.2)
Total Protein: 6 g/dL — ABNORMAL LOW (ref 6.5–8.1)

## 2020-05-23 LAB — LACTATE DEHYDROGENASE: LDH: 125 U/L (ref 98–192)

## 2020-05-23 LAB — CBC WITH DIFFERENTIAL (CANCER CENTER ONLY)
Abs Immature Granulocytes: 0.02 10*3/uL (ref 0.00–0.07)
Basophils Absolute: 0 10*3/uL (ref 0.0–0.1)
Basophils Relative: 1 %
Eosinophils Absolute: 0.1 10*3/uL (ref 0.0–0.5)
Eosinophils Relative: 1 %
HCT: 40.9 % (ref 39.0–52.0)
Hemoglobin: 14 g/dL (ref 13.0–17.0)
Immature Granulocytes: 1 %
Lymphocytes Relative: 20 %
Lymphs Abs: 0.9 10*3/uL (ref 0.7–4.0)
MCH: 31.7 pg (ref 26.0–34.0)
MCHC: 34.2 g/dL (ref 30.0–36.0)
MCV: 92.5 fL (ref 80.0–100.0)
Monocytes Absolute: 0.6 10*3/uL (ref 0.1–1.0)
Monocytes Relative: 13 %
Neutro Abs: 2.8 10*3/uL (ref 1.7–7.7)
Neutrophils Relative %: 64 %
Platelet Count: 138 10*3/uL — ABNORMAL LOW (ref 150–400)
RBC: 4.42 MIL/uL (ref 4.22–5.81)
RDW: 14 % (ref 11.5–15.5)
WBC Count: 4.4 10*3/uL (ref 4.0–10.5)
nRBC: 0 % (ref 0.0–0.2)

## 2020-05-23 LAB — CYTOLOGY - NON PAP

## 2020-05-23 MED ORDER — SPIRONOLACTONE 25 MG PO TABS: 25.0000 mg | ORAL_TABLET | Freq: Every day | ORAL | 2 refills | Status: DC

## 2020-05-23 MED ORDER — PALONOSETRON HCL INJECTION 0.25 MG/5ML
0.2500 mg | Freq: Once | INTRAVENOUS | Status: AC
  Administered 2020-05-23: 0.25 mg via INTRAVENOUS

## 2020-05-23 MED ORDER — ACETAMINOPHEN 325 MG PO TABS
ORAL_TABLET | ORAL | Status: AC
  Filled 2020-05-23: qty 2

## 2020-05-23 MED ORDER — DIPHENHYDRAMINE HCL 25 MG PO CAPS
ORAL_CAPSULE | ORAL | Status: AC
  Filled 2020-05-23: qty 1

## 2020-05-23 MED ORDER — FAM-TRASTUZUMAB DERUXTECAN-NXKI CHEMO 100 MG IV SOLR
450.0000 mg | Freq: Once | INTRAVENOUS | Status: DC
  Filled 2020-05-23: qty 22.5

## 2020-05-23 MED ORDER — ACETAMINOPHEN 325 MG PO TABS
650.0000 mg | ORAL_TABLET | Freq: Once | ORAL | Status: AC
  Administered 2020-05-23: 650 mg via ORAL

## 2020-05-23 MED ORDER — HEPARIN SOD (PORK) LOCK FLUSH 100 UNIT/ML IV SOLN
500.0000 [IU] | Freq: Once | INTRAVENOUS | Status: AC | PRN
  Administered 2020-05-23: 500 [IU]
  Filled 2020-05-23: qty 5

## 2020-05-23 MED ORDER — DIPHENHYDRAMINE HCL 25 MG PO CAPS
50.0000 mg | ORAL_CAPSULE | Freq: Once | ORAL | Status: AC
  Administered 2020-05-23: 25 mg via ORAL

## 2020-05-23 MED ORDER — SODIUM CHLORIDE 0.9% FLUSH
10.0000 mL | INTRAVENOUS | Status: DC | PRN
  Administered 2020-05-23: 10 mL
  Filled 2020-05-23: qty 10

## 2020-05-23 MED ORDER — SODIUM CHLORIDE 0.9 % IV SOLN
10.0000 mg | Freq: Once | INTRAVENOUS | Status: DC
  Filled 2020-05-23: qty 1

## 2020-05-23 MED ORDER — PALONOSETRON HCL INJECTION 0.25 MG/5ML
INTRAVENOUS | Status: AC
  Filled 2020-05-23: qty 5

## 2020-05-23 MED ORDER — DEXTROSE 5 % IV SOLN
Freq: Once | INTRAVENOUS | Status: AC
  Filled 2020-05-23: qty 250

## 2020-05-23 MED FILL — SPIRONOLACTONE 25 MG TABS: 25 | 30 days supply | Qty: 30 | Fill #0

## 2020-05-23 NOTE — Patient Instructions (Signed)
Implanted Port Insertion, Care After This sheet gives you information about how to care for yourself after your procedure. Your health care provider may also give you more specific instructions. If you have problems or questions, contact your health care provider. What can I expect after the procedure? After the procedure, it is common to have:  Discomfort at the port insertion site.  Bruising on the skin over the port. This should improve over 3-4 days. Follow these instructions at home: Port care  After your port is placed, you will get a manufacturer's information card. The card has information about your port. Keep this card with you at all times.  Take care of the port as told by your health care provider. Ask your health care provider if you or a family member can get training for taking care of the port at home. A home health care nurse may also take care of the port.  Make sure to remember what type of port you have. Incision care  Follow instructions from your health care provider about how to take care of your port insertion site. Make sure you: ? Wash your hands with soap and water before and after you change your bandage (dressing). If soap and water are not available, use hand sanitizer. ? Change your dressing as told by your health care provider. ? Leave stitches (sutures), skin glue, or adhesive strips in place. These skin closures may need to stay in place for 2 weeks or longer. If adhesive strip edges start to loosen and curl up, you may trim the loose edges. Do not remove adhesive strips completely unless your health care provider tells you to do that.  Check your port insertion site every day for signs of infection. Check for: ? Redness, swelling, or pain. ? Fluid or blood. ? Warmth. ? Pus or a bad smell.      Activity  Return to your normal activities as told by your health care provider. Ask your health care provider what activities are safe for you.  Do not  lift anything that is heavier than 10 lb (4.5 kg), or the limit that you are told, until your health care provider says that it is safe. General instructions  Take over-the-counter and prescription medicines only as told by your health care provider.  Do not take baths, swim, or use a hot tub until your health care provider approves. Ask your health care provider if you may take showers. You may only be allowed to take sponge baths.  Do not drive for 24 hours if you were given a sedative during your procedure.  Wear a medical alert bracelet in case of an emergency. This will tell any health care providers that you have a port.  Keep all follow-up visits as told by your health care provider. This is important. Contact a health care provider if:  You cannot flush your port with saline as directed, or you cannot draw blood from the port.  You have a fever or chills.  You have redness, swelling, or pain around your port insertion site.  You have fluid or blood coming from your port insertion site.  Your port insertion site feels warm to the touch.  You have pus or a bad smell coming from the port insertion site. Get help right away if:  You have chest pain or shortness of breath.  You have bleeding from your port that you cannot control. Summary  Take care of the port as told by your   health care provider. Keep the manufacturer's information card with you at all times.  Change your dressing as told by your health care provider.  Contact a health care provider if you have a fever or chills or if you have redness, swelling, or pain around your port insertion site.  Keep all follow-up visits as told by your health care provider. This information is not intended to replace advice given to you by your health care provider. Make sure you discuss any questions you have with your health care provider. Document Revised: 09/30/2017 Document Reviewed: 09/30/2017 Elsevier Patient Education   2021 [employer]vier Inc.  

## 2020-05-23 NOTE — Progress Notes (Signed)
Patient did not get his treatment today.  Only the medications that are signed on the [facility].  Dr Marin Olp stated he wanted to get patient in to talk to him about Pathology that was just called to him.Ferdinand Lango cancelled per Dr Marin Olp.  Patient flushed and sent home

## 2020-05-23 NOTE — Progress Notes (Signed)
Hematology and Oncology Follow Up Visit  Matthew Reynolds [id number] 05/22/1965 55 y.o. 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 Mcartor is here today for follow-up.  Unfortunately, we now have documented progressive disease.  He was hospitalized last week.  He had shortness of breath.  Had a large left pleural effusion.  He had 1400 cc of fluid removed.  It certainly looked transudate of by the numbers.  However, the pathologist who was incredibly thorough, did some stains and unfortunately the stains came back positive for adenocarcinoma consistent with a GI primary.  This is certainly a very unusual recurrence area for his GE junction cancer.  I would have thought that this would have recurred in his liver.  When he had his CT scan done a week or so ago, there really was no obvious evidence of metastatic disease or recurrent disease.  I really believe that were going to need to get some tissue.  I really need molecular markers so we can see exactly how we can try to tailor his treatment.  He has been doing so well with the Enhertu.  I would like to think that this is still an option or at least anti-HER-2 therapy is an option.  I have spoken with Dr. Merilynn Finland of thoracic surgery.  I realize that a VATS would be quite invasive but yet I think this is only area with  the pleura that we can get tissue so we can get molecular markers.  I do not have confidence in the malignant cells in the fluid as it does not look like there is that much of a concentration.  He had a echocardiogram done while in the hospital.  The left ventricular ejection fraction was 55-60%.  He has lost a little bit of weight.  He says he is gaining some weight with eating.  Overall, I would have to say his performance status is by ECOG 1.    Medications:  Allergies as of 05/23/2020   No Known Allergies     Medication List       Accurate as of May 23, 2020  1:27 PM. If you have any questions, ask your nurse or doctor.        acetaminophen 500 MG tablet Commonly known as: TYLENOL Take 1,000 mg by mouth every 6 (six) hours as needed for mild pain.   cetirizine 10 MG tablet Commonly known as: ZYRTEC Take 10 mg by mouth daily as needed for allergies.   Dexilant 60 MG capsule Generic drug: dexlansoprazole Take 1 capsule (60 mg total) by mouth daily.   furosemide 40 MG tablet Commonly known as: LASIX Take 1 tablet (40 mg total) by mouth daily. Start with 1/2 tab for 3days then increase to 1tab daily   MULTI-VITAMIN DAILY PO Take by mouth.   spironolactone 100 MG tablet Commonly known as: ALDACTONE Take  0.5-1 tablets (50-100 mg total) by mouth daily. Take 33m daily for 3days then increase to 1037mdaily       Allergies: No Known Allergies  Past Medical History, Surgical history, Social history, and Family History were reviewed and updated.  Review of Systems: Review of Systems  Constitutional: Negative.   HENT: Negative.   Eyes: Negative.   Respiratory: Negative.   Cardiovascular: Negative.   Gastrointestinal: Negative.   Genitourinary: Negative.   Musculoskeletal: Negative.   Skin: Negative.   Neurological: Negative.   Endo/Heme/Allergies: Negative.   Psychiatric/Behavioral: Negative.       Physical Exam:  weight is 146 lb (66.2 kg). His oral  temperature is 99.1 F (37.3 C). His blood pressure is 119/85 and his pulse is 90. His respiration is 18 and oxygen saturation is 99%.   Wt Readings from Last 3 Encounters:  05/23/20 146 lb (66.2 kg)  05/16/20 146 lb 11.2 oz (66.5 kg)  04/25/20 155 lb (70.3 kg)    Physical Exam Vitals reviewed.  HENT:     Head: Normocephalic and atraumatic.  Eyes:     Pupils: Pupils are equal, round, and reactive to light.  Cardiovascular:     Rate and Rhythm: Normal rate and regular rhythm.     Heart sounds: Normal heart sounds.  Pulmonary:     Effort: Pulmonary effort is normal.     Breath sounds: Normal breath sounds.  Abdominal:     General: Bowel sounds are normal.     Palpations: Abdomen is soft.  Musculoskeletal:        General: No tenderness or deformity. Normal range of motion.     Cervical back: Normal range of motion.  Lymphadenopathy:     Cervical: No cervical adenopathy.  Skin:    General: Skin is warm and dry.     Findings: No erythema or rash.  Neurological:     Mental Status: He is alert and oriented to person, place, and time.  Psychiatric:        Behavior: Behavior normal.        Thought Content: Thought content normal.        Judgment: Judgment normal.      Lab Results  Component Value Date   WBC 4.4 05/23/2020   HGB 14.0 05/23/2020   HCT 40.9 05/23/2020   MCV 92.5 05/23/2020   PLT 138 (L) 05/23/2020   Lab Results  Component Value Date   FERRITIN 222 12/13/2019   IRON 55 12/13/2019   TIBC 289 12/13/2019   UIBC 234 12/13/2019   IRONPCTSAT 19 (L) 12/13/2019   Lab Results  Component Value Date   RBC 4.42 05/23/2020   No results found for: KPAFRELGTCHN, LAMBDASER, KAPLAMBRATIO No results found for: IGGSERUM, IGA, IGMSERUM No results found for: TOOdetta PinkSPEI   Chemistry      Component Value Date/Time   NA 135 05/23/2020 0820   K 3.8 05/23/2020 0820   CL 100 05/23/2020 0820   CO2 30  05/23/2020 0820   BUN 13 05/23/2020 0820   CREATININE 0.90 05/23/2020 0820      Component Value Date/Time   CALCIUM 9.0 05/23/2020 0820   ALKPHOS 208 (H) 05/23/2020 0820   AST 34 05/23/2020 0820   ALT 30 05/23/2020 0820   BILITOT 0.7 05/23/2020 0820       Impression and Plan: Mr. MaBinks a very pleasant [age]yo[place]asian gentleman with metastatic adenocarcinoma of the GE junction, HER-2 positive, PD-L1  positive.  Again, looks like he does have progressive disease.  Again I am very surprised by this as this is a very unusual site for recurrence.  Hopefully he will have the biopsy next week.  Again I hate that this is a VATS procedure but I really need to get a lot of tissue for our molecular studies.  I am sure we will have to incorporate chemotherapy and possibly immunotherapy.  He has not had really chemotherapy for 2 years.  As such, I really think he would respond again.  I just hate the fact that we have to change treatment saw him.  He is always done so well.  We will plan to get him back once we get the molecular studies and biopsies.   [name], MD 3/8/20221:27 PM

## 2020-05-23 NOTE — Patient Instructions (Signed)
Fam-Trastuzumab deruxtecan injection What is this medicine? TRASTUZUMAB DERUXTECAN (tras TOOZ eu mab DER ux TEE kan) is a monoclonal antibody combined with chemotherapy. It is used to treat breast cancer. This medicine may be used for other purposes; ask your health care provider or pharmacist if you have questions. COMMON BRAND NAME(S): ENHERTU What should I tell my health care provider before I take this medicine? They need to know if you have any of these conditions:  heart disease  heart failure  infection (especially a virus infection such as chickenpox, cold sores, or herpes)  liver disease  lung or breathing disease, like asthma  an unusual or allergic reaction to fam-trastuzumab deruxtecan, other medications, foods, dyes, or preservatives  pregnant or trying to get pregnant  breast-feeding How should I use this medicine? This medicine is for infusion into a vein. It is given by a health care professional in a hospital or clinic setting. Talk to your pediatrician regarding the use of this medicine in children. Special care may be needed. Overdosage: If you think you have taken too much of this medicine contact a poison control center or emergency room at once. NOTE: This medicine is only for you. Do not share this medicine with others. What if I miss a dose? It is important not to miss your dose. Call your doctor or health care professional if you are unable to keep an appointment. What may interact with this medicine? Interaction studies have not been performed. This list may not describe all possible interactions. Give your health care provider a list of all the medicines, herbs, non-prescription drugs, or dietary supplements you use. Also tell them if you smoke, drink alcohol, or use illegal drugs. Some items may interact with your medicine. What should I watch for while using this medicine? Visit your healthcare professional for regular checks on your progress. Tell your  healthcare professional if your symptoms do not start to get better or if they get worse. Your condition will be monitored carefully while you are receiving this medicine. Do not become pregnant while taking this medicine or for 7 months after stopping it. Women should inform their healthcare professional if they wish to become pregnant or think they might be pregnant. Men should not father a child while taking this medicine and for 4 months after stopping it. There is potential for serious side effects to an unborn child. Talk to your healthcare professional for more information. Do not breast-feed an infant while taking this medicine or for 7 months after the last dose. This medicine has caused decreased sperm counts in some men. This may make it more difficult to father a child. Talk to your healthcare professional if you are concerned about your fertility. This medicine may increase your risk to bruise or bleed. Call your health care professional if you notice any unusual bleeding. Be careful brushing or flossing your teeth or using a toothpick because you may get an infection or bleed more easily. If you have any dental work done, tell your dentist you are receiving this medicine. This medicine may cause dry eyes [and blurred vision]. If you wear contact lenses, you may feel some discomfort. Lubricating eye drops may help. See your healthcare professional if the problem does not go away or is severe. Call your healthcare professional for advice if you get a fever, chills, or sore throat, or other symptoms of a cold or flu. Do not treat yourself. This medicine decreases your body's ability to fight infections. Try to  avoid being around people who are sick. Avoid taking medicines that contain aspirin, acetaminophen, ibuprofen, naproxen, or ketoprofen unless instructed by your healthcare professional. These medicines may hide a fever. What side effects may I notice from receiving this medicine? Side  effects that you should report to your doctor or health care professional as soon as possible:  allergic reactions like skin rash, itching or hives, swelling of the face, lips, or tongue  breathing problems  cough  nausea, vomiting  signs and symptoms of bleeding such as bloody or black, tarry stools; red or dark-brown urine; spitting up blood or brown material that looks like coffee grounds; red spots on the skin; unusual bruising or bleeding from the eye, gums, or nose  signs and symptoms of heart failure like breathing problems, fast, irregular heartbeat, sudden weight gain; swelling of the ankles, feet, hands; unusually weak or tired  signs and symptoms of infection like fever; chills; cough; sore throat; pain or trouble passing urine  signs and symptoms of low red blood cells or anemia such as unusually weak or tired; feeling faint or lightheaded; falls; breathing problems Side effects that usually do not require medical attention (report these to your doctor or health care professional if they continue or are bothersome):  constipation  diarrhea  dry eyes  hair loss  loss of appetite  mouth sores  rash This list may not describe all possible side effects. Call your doctor for medical advice about side effects. You may report side effects to FDA at 1-800-FDA-1088. Where should I keep my medicine? This drug is given in a hospital or clinic and will not be stored at home. NOTE: This sheet is a summary. It may not cover all possible information. If you have questions about this medicine, talk to your doctor, pharmacist, or health care provider.  2020 Elsevier/Gold Standard (2018-05-12 15:07:27) Pt discharged in no apparent distress. Pt left ambulatory without assistance. Pt aware of discharge instructions and verbalized understanding and had no further questions.

## 2020-05-24 ENCOUNTER — Telehealth: Payer: Self-pay | Admitting: *Deleted

## 2020-05-24 NOTE — Telephone Encounter (Signed)
No los per 05/23/20 to be scheduled rdf

## 2020-05-25 DIAGNOSIS — E441 Mild protein-calorie malnutrition: Secondary | ICD-10-CM | POA: Diagnosis not present

## 2020-05-25 DIAGNOSIS — C159 Malignant neoplasm of esophagus, unspecified: Secondary | ICD-10-CM | POA: Diagnosis not present

## 2020-05-31 ENCOUNTER — Institutional Professional Consult (permissible substitution) (INDEPENDENT_AMBULATORY_CARE_PROVIDER_SITE_OTHER): Payer: BC Managed Care – PPO | Admitting: Thoracic Surgery (Cardiothoracic Vascular Surgery)

## 2020-05-31 ENCOUNTER — Other Ambulatory Visit: Payer: Self-pay

## 2020-05-31 ENCOUNTER — Other Ambulatory Visit: Payer: Self-pay | Admitting: *Deleted

## 2020-05-31 VITALS — BP 135/90 | HR 100 | Resp 20 | Ht 69.0 in | Wt 146.0 lb

## 2020-05-31 DIAGNOSIS — J90 Pleural effusion, not elsewhere classified: Secondary | ICD-10-CM | POA: Diagnosis not present

## 2020-05-31 DIAGNOSIS — I313 Pericardial effusion (noninflammatory): Secondary | ICD-10-CM

## 2020-05-31 DIAGNOSIS — I3139 Other pericardial effusion (noninflammatory): Secondary | ICD-10-CM

## 2020-05-31 NOTE — Progress Notes (Signed)
PCP is Cari Caraway, MD Referring Provider is Marin Olp Rudell Cobb, MD  Chief Complaint  Patient presents with  . Pleural Effusion    Surgical consult, Chest/ABD/Pelvis CT 05/16/20    HPI: [name] is sent for consultation regarding left pleural and pericardial effusions.  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  He was diagnosed in 2019.  He was treated with chemotherapy and then had radiation.  He was currently on maintenance therapy with Enhertu.  He recently had a follow-up scan which showed new bilateral pleural effusions left greater than right, pericardial effusion, and ascites.  He had not been having any significant shortness of breath or swelling in his legs.  He did complain of some abdominal fullness and sensation of discomfort in his left upper quadrant/ left costal margin area.  He was admitted to the hospital on 05/17/2020.  He had a thoracentesis which drained 1.2 L of fluid.  Fluid was consistent with an exudate.  Cytology was positive for metastatic adenocarcinoma.  He has had some increase in the sensation of fullness that he had previously over the past several days.  Zubrod Score: At the time of surgery this patient's most appropriate activity status/level should be described as: [x]     0    Normal activity, no symptoms []     1    Restricted in physical strenuous activity but ambulatory, able to do out light work []     2    Ambulatory and capable of self care, unable to do work activities, up and about >50 % of waking hours                              []     3    Only limited self care, in bed greater than 50% of waking hours []     4    Completely disabled, no self care, confined to bed or chair []     5    Moribund  Past Medical History:  Diagnosis Date  . Adenocarcinoma of cardio-esophageal junction ([place]) 12/10/2017  . Goals of care, counseling/discussion 12/10/2017  . Iron  deficiency anemia due to chronic blood loss 12/11/2017  . Malignant neoplasm metastatic to liver ([place]) 12/10/2017  . Metastasis from adenocarcinoma of gastroesophageal structure ([place]) 12/10/2017    Past Surgical History:  Procedure Laterality Date  . CYST EXCISION Left 01/15/2016   Procedure: EXCISION SEBACEOUS CYST LEFT UPPER BACK;  Surgeon: Georganna Skeans, MD;  Location: [facility];  Service: General;  Laterality: Left;  . IR IMAGING GUIDED PORT INSERTION  12/15/2017  . THORACENTESIS Left 05/17/2020   Procedure: THORACENTESIS;  Surgeon: Spero Geralds, MD;  Location: [facility] ENDOSCOPY;  Service: Pulmonary;  Laterality: Left;    No family history on file.  Social History Social History   Tobacco Use  . Smoking status: Never Smoker  . Smokeless tobacco: Never Used  Vaping Use  . Vaping Use: Never used  Substance Use Topics  . Alcohol use: Yes    Comment: weekly  . Drug use: No    Current Outpatient Medications  Medication Sig Dispense Refill  . Multiple Vitamin (MULTI-VITAMIN DAILY PO) Take by mouth.    . spironolactone (ALDACTONE) 25 MG tablet Take 1 tablet (25 mg total) by mouth daily. 30 tablet 2  . acetaminophen (TYLENOL) 500 MG tablet Take 1,000 mg by mouth every 6 (six)  hours as needed for mild pain.    Marland Kitchen dexlansoprazole (DEXILANT) 60 MG capsule Take 1 capsule (60 mg total) by mouth daily. (Patient not taking: No sig reported) 90 capsule 1  . furosemide (LASIX) 40 MG tablet Take 1 tablet (40 mg total) by mouth daily. Start with 1/2 tab for 3days then increase to 1tab daily 30 tablet 1   No current facility-administered medications for this visit.   Facility-Administered Medications Ordered in Other Visits  Medication Dose Route Frequency Provider Last Rate Last Admin  . sodium chloride flush (NS) 0.9 % injection 10 mL  10 mL Intravenous PRN [name], MD   10 mL at 03/31/18 1100    No Known Allergies  Review of Systems  Constitutional: Negative for activity change,  appetite change, fever and unexpected weight change.  Eyes: Negative for visual disturbance.  Respiratory: Positive for cough, chest tightness and shortness of breath.   Gastrointestinal: Positive for abdominal distention and constipation.  Genitourinary: Negative.   Musculoskeletal: Negative.   Neurological: Positive for numbness (Neuropathy in toes).  All other systems reviewed and are negative.   BP 135/90   Pulse 100   Resp 20   Ht 5\' 9"  (1.753 m)   Wt 146 lb (66.2 kg)   SpO2 97% Comment: RA  BMI 21.56 kg/m  Physical Exam Vitals reviewed.  Constitutional:      General: He is not in acute distress.    Appearance: Normal appearance.  HENT:     Head: Normocephalic and atraumatic.  Eyes:     General: No scleral icterus.    Extraocular Movements: Extraocular movements intact.  Cardiovascular:     Rate and Rhythm: Normal rate and regular rhythm.     Heart sounds: Normal heart sounds. No friction rub.  Pulmonary:     Effort: Pulmonary effort is normal. No respiratory distress.     Breath sounds: No wheezing or rales.     Comments: Diminished breath sounds left base with dullness to percussion Abdominal:     General: There is no distension.     Palpations: Abdomen is soft.  Musculoskeletal:        General: No swelling.  Lymphadenopathy:     Cervical: No cervical adenopathy.  Skin:    General: Skin is warm and dry.  Neurological:     General: No focal deficit present.     Mental Status: He is alert and oriented to person, place, and time.     Cranial Nerves: No cranial nerve deficit.     Motor: No weakness.    Diagnostic Tests: CT CHEST, ABDOMEN, AND PELVIS WITH CONTRAST  TECHNIQUE: Multidetector CT imaging of the chest, abdomen and pelvis was performed following the standard protocol during bolus administration of intravenous contrast.  CONTRAST:  167mL OMNIPAQUE IOHEXOL 300 MG/ML  SOLN  COMPARISON:  PET-CT 01/27/2020, 08/25/2019  FINDINGS: CT CHEST  FINDINGS  Cardiovascular: Port in the anterior chest wall with tip in distal SVC. Small pericardial effusion is new from prior. Fusion is most prominent along the base the heart measuring 13 mm in depth (image 41/coronal series 5).  Mediastinum/Nodes: Continued mild esophageal wall thickening through the distal esophagus up to the GE junction. No change from prior. No mass lesion or obstruction.  No enlarged mediastinal lymph nodes.  Lungs/Pleura: Bilateral moderate pleural effusions are new from prior. New bibasilar passive atelectasis.  Musculoskeletal: No aggressive osseous lesion.  CT ABDOMEN AND PELVIS FINDINGS  Hepatobiliary: Liver has a fine nodular contour. No  enhancing lesion. Gallbladder is collapsed.  Moderate volume of intraperitoneal free fluid along the margin the RIGHT hepatic lobe. Portal veins are patent.  Pancreas: Pancreas is normal. No ductal dilatation. No pancreatic inflammation.  Spleen: Normal spleen  Adrenals/urinary tract: Adrenal glands and kidneys are normal. The ureters and bladder normal.  Stomach/Bowel: Stomach, small bowel, appendix, and cecum are normal. The colon and rectosigmoid colon are normal.  Vascular/Lymphatic: Abdominal aorta is normal caliber. There is no retroperitoneal or periportal lymphadenopathy. No pelvic lymphadenopathy.  Reproductive: Prostate normal  Other: Moderate volume of intraperitoneal free fluid extends along the pericolic gutters into the pelvis. New from prior  Musculoskeletal: No aggressive osseous lesion. 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  IMPRESSION: Chest Impression:  1. New pericardial effusion and moderate bilateral pleural effusions. 2. No evidence of esophageal cancer recurrence. Stable thickening through the distal esophagus. 3. No pulmonary metastasis or mediastinal adenopathy.  Abdomen / Pelvis Impression:  1. No metastatic disease in the  abdomen pelvis. 2. New intraperitoneal free fluid in the abdomen pelvis. 3. With new pericardial effusion, pleural effusions, and intraperitoneal free fluid query cardiac dysfunction or cardiac toxicity.  These results will be called to the ordering clinician or representative by the Radiologist Assistant, and communication documented in the PACS or [employer].   Electronically Signed   By: Suzy Bouchard M.D.   On: 05/16/2020 16:10 I personally reviewed the CT images.  It shows a pericardial effusion primarily along the diaphragm.  There are bilateral pleural effusions left greater than right.  Impression: Matthew Reynolds is a 55 year old man with stage IV adenocarcinoma of the GE junction with liver metastases.  He has been treated with chemotherapy followed by radiation and then Enhertu-in maintenance therapy.  He now has new pericardial and pleural effusions left greater than right.  Thoracentesis of the left effusion showed metastatic adenocarcinoma.  He needs his effusions dealt with in a more definitive fashion.  He also needs additional tissue for molecular testing to see if it has the same mutations as previous tumor.  I recommended him that we proceed with a left VATS for pericardial window, pleural biopsies and pleural catheter placement.  I informed Mr. and Mrs. Proffit of the general nature of the procedure.  They understand he will have an indwelling catheter that he goes home with and will need ongoing care.  They understand we will plan to do the procedure under general anesthesia and that we discussed the incisions to be used.  I informed them of the indications, risks, benefits, and alternatives.  They understand the risks include, but not limited to death, MI, DVT, PE, bleeding, possible need for transfusion, infection, as well as possibility of other unforeseeable complications.  He accepts the risk and agrees to proceed  Plan: Left VATS for pericardial window,  pleural biopsies, and pleural catheter placement on Monday, 06/05/2020  [name], MD [facility] [phone]

## 2020-05-31 NOTE — H&P (View-Only) (Signed)
PCP is Cari Caraway, MD Referring Provider is Marin Olp Rudell Cobb, MD  Chief Complaint  Patient presents with  . Pleural Effusion    Surgical consult, Chest/ABD/Pelvis CT 05/16/20    HPI: Matthew Reynolds is sent for consultation regarding left pleural and pericardial effusions.  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  He was diagnosed in 2019.  He was treated with chemotherapy and then had radiation.  He was currently on maintenance therapy with Enhertu.  He recently had a follow-up scan which showed new bilateral pleural effusions left greater than right, pericardial effusion, and ascites.  He had not been having any significant shortness of breath or swelling in his legs.  He did complain of some abdominal fullness and sensation of discomfort in his left upper quadrant/ left costal margin area.  He was admitted to the hospital on 05/17/2020.  He had a thoracentesis which drained 1.2 L of fluid.  Fluid was consistent with an exudate.  Cytology was positive for metastatic adenocarcinoma.  He has had some increase in the sensation of fullness that he had previously over the past several days.  Zubrod Score: At the time of surgery this patient's most appropriate activity status/level should be described as: [x]     0    Normal activity, no symptoms []     1    Restricted in physical strenuous activity but ambulatory, able to do out light work []     2    Ambulatory and capable of self care, unable to do work activities, up and about >50 % of waking hours                              []     3    Only limited self care, in bed greater than 50% of waking hours []     4    Completely disabled, no self care, confined to bed or chair []     5    Moribund  Past Medical History:  Diagnosis Date  . Adenocarcinoma of cardio-esophageal junction ([place]) 12/10/2017  . Goals of care, counseling/discussion 12/10/2017  . Iron  deficiency anemia due to chronic blood loss 12/11/2017  . Malignant neoplasm metastatic to liver ([place]) 12/10/2017  . 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 CYST EXCISION Left 01/15/2016   Procedure: EXCISION SEBACEOUS CYST LEFT UPPER BACK;  Surgeon: Georganna Skeans, MD;  Location: [facility];  Service: General;  Laterality: Left;  . IR IMAGING GUIDED PORT INSERTION  12/15/2017  . THORACENTESIS Left 05/17/2020   Procedure: THORACENTESIS;  Surgeon: Spero Geralds, MD;  Location: [facility] ENDOSCOPY;  Service: Pulmonary;  Laterality: Left;    No family history on file.  Social History Social History   Tobacco Use  . Smoking status: Never Smoker  . Smokeless tobacco: Never Used  Vaping Use  . Vaping Use: Never used  Substance Use Topics  . Alcohol use: Yes    Comment: weekly  . Drug use: No    Current Outpatient Medications  Medication Sig Dispense Refill  . Multiple Vitamin (MULTI-VITAMIN DAILY PO) Take by mouth.    . spironolactone (ALDACTONE) 25 MG tablet Take 1 tablet (25 mg total) by mouth daily. 30 tablet 2  . acetaminophen (TYLENOL) 500 MG tablet Take 1,000 mg by mouth every 6 (six)  hours as needed for mild pain.    Marland Kitchen dexlansoprazole (DEXILANT) 60 MG capsule Take 1 capsule (60 mg total) by mouth daily. (Patient not taking: No sig reported) 90 capsule 1  . furosemide (LASIX) 40 MG tablet Take 1 tablet (40 mg total) by mouth daily. Start with 1/2 tab for 3days then increase to 1tab daily 30 tablet 1   No current facility-administered medications for this visit.   Facility-Administered Medications Ordered in Other Visits  Medication Dose Route Frequency Provider Last Rate Last Admin  . sodium chloride flush (NS) 0.9 % injection 10 mL  10 mL Intravenous PRN [name], MD   10 mL at 03/31/18 1100    No Known Allergies  Review of Systems  Constitutional: Negative for activity change,  appetite change, fever and unexpected weight change.  Eyes: Negative for visual disturbance.  Respiratory: Positive for cough, chest tightness and shortness of breath.   Gastrointestinal: Positive for abdominal distention and constipation.  Genitourinary: Negative.   Musculoskeletal: Negative.   Neurological: Positive for numbness (Neuropathy in toes).  All other systems reviewed and are negative.   BP 135/90   Pulse 100   Resp 20   Ht 5\' 9"  (1.753 m)   Wt 146 lb (66.2 kg)   SpO2 97% Comment: RA  BMI 21.56 kg/m  Physical Exam Vitals reviewed.  Constitutional:      General: He is not in acute distress.    Appearance: Normal appearance.  HENT:     Head: Normocephalic and atraumatic.  Eyes:     General: No scleral icterus.    Extraocular Movements: Extraocular movements intact.  Cardiovascular:     Rate and Rhythm: Normal rate and regular rhythm.     Heart sounds: Normal heart sounds. No friction rub.  Pulmonary:     Effort: Pulmonary effort is normal. No respiratory distress.     Breath sounds: No wheezing or rales.     Comments: Diminished breath sounds left base with dullness to percussion Abdominal:     General: There is no distension.     Palpations: Abdomen is soft.  Musculoskeletal:        General: No swelling.  Lymphadenopathy:     Cervical: No cervical adenopathy.  Skin:    General: Skin is warm and dry.  Neurological:     General: No focal deficit present.     Mental Status: He is alert and oriented to person, place, and time.     Cranial Nerves: No cranial nerve deficit.     Motor: No weakness.    Diagnostic Tests: CT CHEST, ABDOMEN, AND PELVIS WITH CONTRAST  TECHNIQUE: Multidetector CT imaging of the chest, abdomen and pelvis was performed following the standard protocol during bolus administration of intravenous contrast.  CONTRAST:  189mL OMNIPAQUE IOHEXOL 300 MG/ML  SOLN  COMPARISON:  PET-CT 01/27/2020, 08/25/2019  FINDINGS: CT CHEST  FINDINGS  Cardiovascular: Port in the anterior chest wall with tip in distal SVC. Small pericardial effusion is new from prior. Fusion is most prominent along the base the heart measuring 13 mm in depth (image 41/coronal series 5).  Mediastinum/Nodes: Continued mild esophageal wall thickening through the distal esophagus up to the GE junction. No change from prior. No mass lesion or obstruction.  No enlarged mediastinal lymph nodes.  Lungs/Pleura: Bilateral moderate pleural effusions are new from prior. New bibasilar passive atelectasis.  Musculoskeletal: No aggressive osseous lesion.  CT ABDOMEN AND PELVIS FINDINGS  Hepatobiliary: Liver has a fine nodular contour. No  enhancing lesion. Gallbladder is collapsed.  Moderate volume of intraperitoneal free fluid along the margin the RIGHT hepatic lobe. Portal veins are patent.  Pancreas: Pancreas is normal. No ductal dilatation. No pancreatic inflammation.  Spleen: Normal spleen  Adrenals/urinary tract: Adrenal glands and kidneys are normal. The ureters and bladder normal.  Stomach/Bowel: Stomach, small bowel, appendix, and cecum are normal. The colon and rectosigmoid colon are normal.  Vascular/Lymphatic: Abdominal aorta is normal caliber. There is no retroperitoneal or periportal lymphadenopathy. No pelvic lymphadenopathy.  Reproductive: Prostate normal  Other: Moderate volume of intraperitoneal free fluid extends along the pericolic gutters into the pelvis. New from prior  Musculoskeletal: No aggressive osseous lesion. 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  IMPRESSION: Chest Impression:  1. New pericardial effusion and moderate bilateral pleural effusions. 2. No evidence of esophageal cancer recurrence. Stable thickening through the distal esophagus. 3. No pulmonary metastasis or mediastinal adenopathy.  Abdomen / Pelvis Impression:  1. No metastatic disease in the  abdomen pelvis. 2. New intraperitoneal free fluid in the abdomen pelvis. 3. With new pericardial effusion, pleural effusions, and intraperitoneal free fluid query cardiac dysfunction or cardiac toxicity.  These results will be called to the ordering clinician or representative by the Radiologist Assistant, and communication documented in the PACS or [employer].   Electronically Signed   By: Suzy Bouchard M.D.   On: 05/16/2020 16:10 I personally reviewed the CT images.  It shows a pericardial effusion primarily along the diaphragm.  There are bilateral pleural effusions left greater than right.  Impression: Matthew Reynolds is a 55 year old man with stage IV adenocarcinoma of the GE junction with liver metastases.  He has been treated with chemotherapy followed by radiation and then Enhertu-in maintenance therapy.  He now has new pericardial and pleural effusions left greater than right.  Thoracentesis of the left effusion showed metastatic adenocarcinoma.  He needs his effusions dealt with in a more definitive fashion.  He also needs additional tissue for molecular testing to see if it has the same mutations as previous tumor.  I recommended him that we proceed with a left VATS for pericardial window, pleural biopsies and pleural catheter placement.  I informed Mr. and Mrs. Kazmierski of the general nature of the procedure.  They understand he will have an indwelling catheter that he goes home with and will need ongoing care.  They understand we will plan to do the procedure under general anesthesia and that we discussed the incisions to be used.  I informed them of the indications, risks, benefits, and alternatives.  They understand the risks include, but not limited to death, MI, DVT, PE, bleeding, possible need for transfusion, infection, as well as possibility of other unforeseeable complications.  He accepts the risk and agrees to proceed  Plan: Left VATS for pericardial window,  pleural biopsies, and pleural catheter placement on Monday, 06/05/2020  [name], MD [facility] [phone]

## 2020-05-31 NOTE — Discharge Summary (Signed)
Physician Discharge Summary  Matthew Reynolds IOE:[id number] DOB: June 26, 1965 DOA: 05/16/2020  PCP: Cari Caraway, MD  Admit date: 05/16/2020 Discharge date: 05/18/2020  Time spent: 35 minutes  Recommendations for Outpatient Follow-up:  [name] in 2 weeks Gastroenterology Dr. 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 Pleural effusion Ascites   Malignant neoplasm metastatic to liver [facility])   Pericardial effusion   Pleural effusion   Bilateral pleural effusion   Discharge Condition: Stable  Diet recommendation: Low-sodium  Filed Weights   05/16/20 1710 05/16/20 2228  Weight: 65.8 kg 66.5 kg    History of present illness:  55 year old male with history of metastatic GE junction adenocarcinoma in remission, previously diagnosed liver mets 3 years ago has been treated with chemo and radiation, currently on maintenance Enhertu every 3 weeks started in April 21, had a surveillance CTs on 3/1 which showed new small pericardial effusion, moderate bilateral effusions and ascites and was advised to come to the ER for this.  Hospital Course:   Bilateral pleural effusions Ascites Decompensated mild liver cirrhosis -Underwent paracentesis yesterday, fluid suggestive of transudative effusion despite mildly elevated LDH, appreciate pulmonary input -Follow-up cytology of pleural fluid after discharge -Echo is within normal limits, liver nodularity noted on CT, right upper quadrant ultrasound was concerning for cirrhosis, etiology is unclear, no history of hepatitis, alcoholism etc -Previously did have liver metastasis, could this cause progression to cirrhosis versus secondary to hepatotoxicity from chemotherapy -Seen by gastroenterology Dr. Benson Norway in consultation,, recommended low-sodium diet, diuretics and follow-up with Dr. Collene Mares in 2 weeks -Needs CMP checked at  follow-up  Adenocarcinoma GE junction with history of liver mets -On maintenance Enhertu every 2 weeks, followed by [name]   Procedures:  Paracentesis  Consultations:  Gastroenterology Dr. Benson Norway  Discharge Exam: Vitals:   05/18/20 0749 05/18/20 1148  BP: 92/64 113/84  Pulse: 85 90  Resp: 20 20  Temp: 98.1 F (36.7 C) 98.1 F (36.7 C)  SpO2: 96% 100%    General: AAOx3, no distress Cardiovascular: S1-S2, regular rate rhythm Respiratory: Decreased breath sounds the bases  Discharge Instructions   Discharge Instructions    Diet - low sodium heart healthy   Complete by: As directed    Discharge instructions   Complete by: As directed    Need labs in 5-6 days and repeat ECHO in 2 months   Increase activity slowly   Complete by: As directed      Allergies as of 05/18/2020   No Known Allergies     Medication List    TAKE these medications   acetaminophen 500 MG tablet Commonly known as: TYLENOL Take 1,000 mg by mouth every 6 (six) hours as needed for mild pain. Notes to patient: As needed   cetirizine 10 MG tablet Commonly known as: ZYRTEC Take 10 mg by mouth daily as needed for allergies.   Dexilant 60 MG capsule Generic drug: dexlansoprazole Take 1 capsule (60 mg total) by mouth daily.   furosemide 40 MG tablet Commonly known as: LASIX Take 1 tablet (40 mg total) by mouth daily. Start with 1/2 tab for 3days then increase to 1tab daily   MULTI-VITAMIN DAILY PO Take by mouth.      No Known Allergies  Follow-up Information    Cari Caraway, MD. Schedule an appointment as soon as possible for a visit in 1 week(s).   Specialty: Family Medicine  Contact information: [place] [place] [address] [phone]                The results of significant diagnostics from this hospitalization (including imaging, microbiology, ancillary and laboratory) are listed below for reference.    Significant Diagnostic Studies: DG Chest 2  View  Result Date: 05/16/2020 CLINICAL DATA:  Chest tightness x1 week. EXAM: CHEST - 2 VIEW COMPARISON:  None. FINDINGS: A right-sided venous Port-A-Cath is seen with its distal tip noted at the junction of the superior vena cava and right atrium. Moderate to marked severity atelectasis and/or infiltrate is seen within the left lung base. There is a very small right pleural effusion. A small left pleural effusion is also seen. No pneumothorax is identified. The heart size and mediastinal contours are within normal limits. The visualized skeletal structures are unremarkable. IMPRESSION: 1. Moderate to marked severity left basilar atelectasis and/or infiltrate. 2. Bilateral pleural effusions, left greater than right. Electronically Signed   By: Virgina Norfolk M.D.   On: 05/16/2020 17:38   CT Chest W Contrast  Result Date: 05/16/2020 CLINICAL DATA:  Gastrointestinal carcinoma surveillance. GE junction adenocarcinoma. Chemotherapy ongoing EXAM: CT CHEST, ABDOMEN, AND PELVIS WITH CONTRAST TECHNIQUE: Multidetector CT imaging of the chest, abdomen and pelvis was performed following the standard protocol during bolus administration of intravenous contrast. CONTRAST:  124mL OMNIPAQUE IOHEXOL 300 MG/ML  SOLN COMPARISON:  PET-CT 01/27/2020, 08/25/2019 FINDINGS: CT CHEST FINDINGS Cardiovascular: Port in the anterior chest wall with tip in distal SVC. Small pericardial effusion is new from prior. Fusion is most prominent along the base the heart measuring 13 mm in depth (image 41/coronal series 5). Mediastinum/Nodes: Continued mild esophageal wall thickening through the distal esophagus up to the GE junction. No change from prior. No mass lesion or obstruction. No enlarged mediastinal lymph nodes. Lungs/Pleura: Bilateral moderate pleural effusions are new from prior. New bibasilar passive atelectasis. Musculoskeletal: No aggressive osseous lesion. CT ABDOMEN AND PELVIS FINDINGS Hepatobiliary: Liver has a fine nodular  contour. No enhancing lesion. Gallbladder is collapsed. Moderate volume of intraperitoneal free fluid along the margin the RIGHT hepatic lobe. Portal veins are patent. Pancreas: Pancreas is normal. No ductal dilatation. No pancreatic inflammation. Spleen: Normal spleen Adrenals/urinary tract: Adrenal glands and kidneys are normal. The ureters and bladder normal. Stomach/Bowel: Stomach, small bowel, appendix, and cecum are normal. The colon and rectosigmoid colon are normal. Vascular/Lymphatic: Abdominal aorta is normal caliber. There is no retroperitoneal or periportal lymphadenopathy. No pelvic lymphadenopathy. Reproductive: Prostate normal Other: Moderate volume of intraperitoneal free fluid extends along the pericolic gutters into the pelvis. New from prior Musculoskeletal: No aggressive osseous lesion. 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 IMPRESSION: Chest Impression: 1. New pericardial effusion and moderate bilateral pleural effusions. 2. No evidence of esophageal cancer recurrence. Stable thickening through the distal esophagus. 3. No pulmonary metastasis or mediastinal adenopathy. Abdomen / Pelvis Impression: 1. No metastatic disease in the abdomen pelvis. 2. New intraperitoneal free fluid in the abdomen pelvis. 3. With new pericardial effusion, pleural effusions, and intraperitoneal free fluid query cardiac dysfunction or cardiac toxicity. These results will be called to the ordering clinician or representative by the Radiologist Assistant, and communication documented in the PACS or [employer]. Electronically Signed   By: Suzy Bouchard M.D.   On: 05/16/2020 16:10   CT Abdomen Pelvis W Contrast  Result Date: 05/16/2020 CLINICAL DATA:  Gastrointestinal carcinoma surveillance. GE junction adenocarcinoma. Chemotherapy ongoing EXAM: CT CHEST, ABDOMEN, AND PELVIS WITH CONTRAST  TECHNIQUE: Multidetector CT imaging of the chest, abdomen and pelvis was performed following the  standard protocol during bolus administration of intravenous contrast. CONTRAST:  155mL OMNIPAQUE IOHEXOL 300 MG/ML  SOLN COMPARISON:  PET-CT 01/27/2020, 08/25/2019 FINDINGS: CT CHEST FINDINGS Cardiovascular: Port in the anterior chest wall with tip in distal SVC. Small pericardial effusion is new from prior. Fusion is most prominent along the base the heart measuring 13 mm in depth (image 41/coronal series 5). Mediastinum/Nodes: Continued mild esophageal wall thickening through the distal esophagus up to the GE junction. No change from prior. No mass lesion or obstruction. No enlarged mediastinal lymph nodes. Lungs/Pleura: Bilateral moderate pleural effusions are new from prior. New bibasilar passive atelectasis. Musculoskeletal: No aggressive osseous lesion. CT ABDOMEN AND PELVIS FINDINGS Hepatobiliary: Liver has a fine nodular contour. No enhancing lesion. Gallbladder is collapsed. Moderate volume of intraperitoneal free fluid along the margin the RIGHT hepatic lobe. Portal veins are patent. Pancreas: Pancreas is normal. No ductal dilatation. No pancreatic inflammation. Spleen: Normal spleen Adrenals/urinary tract: Adrenal glands and kidneys are normal. The ureters and bladder normal. Stomach/Bowel: Stomach, small bowel, appendix, and cecum are normal. The colon and rectosigmoid colon are normal. Vascular/Lymphatic: Abdominal aorta is normal caliber. There is no retroperitoneal or periportal lymphadenopathy. No pelvic lymphadenopathy. Reproductive: Prostate normal Other: Moderate volume of intraperitoneal free fluid extends along the pericolic gutters into the pelvis. New from prior Musculoskeletal: No aggressive osseous lesion. 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 IMPRESSION: Chest Impression: 1. New pericardial effusion and moderate bilateral pleural effusions. 2. No evidence of esophageal cancer recurrence. Stable thickening through the distal esophagus. 3. No pulmonary metastasis or  mediastinal adenopathy. Abdomen / Pelvis Impression: 1. No metastatic disease in the abdomen pelvis. 2. New intraperitoneal free fluid in the abdomen pelvis. 3. With new pericardial effusion, pleural effusions, and intraperitoneal free fluid query cardiac dysfunction or cardiac toxicity. These results will be called to the ordering clinician or representative by the Radiologist Assistant, and communication documented in the PACS or [employer]. Electronically Signed   By: Suzy Bouchard M.D.   On: 05/16/2020 16:10   DG CHEST PORT 1 VIEW  Result Date: 05/17/2020 CLINICAL DATA:  Pleural effusion EXAM: PORTABLE CHEST 1 VIEW COMPARISON:  May 16, 2020 FINDINGS: The heart size and mediastinal contours are within normal limits. A trace left pleural effusion is present, decreased in size from the prior exam. A right-sided MediPort catheter seen with the tip at the superior cavoatrial junction. There is prominence of the central pulmonary vasculature. No acute osseous abnormality. IMPRESSION: . Mild pulmonary vascular congestion Interval decreased size in the left pleural effusion Electronically Signed   By: Prudencio Pair M.D.   On: 05/17/2020 17:34   ECHOCARDIOGRAM LIMITED  Result Date: 05/17/2020    ECHOCARDIOGRAM LIMITED REPORT   Patient Name:   Matthew Reynolds Date of Exam: 05/17/2020 Medical Rec #:  [id number]      Height:       69.0 in Accession #:    [id number]     Weight:       146.7 lb Date of Birth:  08-03-1965       BSA:          1.811 m Patient Age:    55 years       BP:           123/86 mmHg Patient Gender: M              HR:  81 bpm. Exam Location:  Inpatient Procedure: Limited Echo, Limited Color Doppler and Cardiac Doppler Indications:    Pericardial effusion I31.3  History:        Patient has prior history of Echocardiogram examinations, most                 recent 01/03/2020.  Sonographer:    Bernadene Person RDCS Referring Phys: [place]  1. Left ventricular ejection  fraction, by estimation, is 55 to 60%. The left ventricle has normal function. The left ventricle has no regional wall motion abnormalities. Left ventricular diastolic parameters are consistent with Grade I diastolic dysfunction (impaired relaxation).  2. Right ventricular systolic function is mildly reduced. The right ventricular size is normal. There is normal pulmonary artery systolic pressure. The estimated right ventricular systolic pressure is 91.6 mmHg.  3. The mitral valve is normal in structure. No evidence of mitral valve regurgitation. No evidence of mitral stenosis.  4. The aortic valve is tricuspid. Aortic valve regurgitation is not visualized. No aortic stenosis is present.  5. The inferior vena cava is normal in size with greater than 50% respiratory variability, suggesting right atrial pressure of 3 mmHg.  6. There is a pericardial effusion present, primarily subcostally and adjacent to the RV. The effusion is large subcostally but small around the rest of the heart. The IVC is small, < 25% respirophasic variation in the mitral E inflow velocity, and there does not appear to be RV diastolic collapse. Tamponade does not appear to be present. FINDINGS  Left Ventricle: Left ventricular ejection fraction, by estimation, is 55 to 60%. The left ventricle has normal function. The left ventricle has no regional wall motion abnormalities. The left ventricular internal cavity size was normal in size. Left ventricular diastolic parameters are consistent with Grade I diastolic dysfunction (impaired relaxation). Right Ventricle: The right ventricular size is normal. Right ventricular systolic function is mildly reduced. There is normal pulmonary artery systolic pressure. The tricuspid regurgitant velocity is 1.91 m/s, and with an assumed right atrial pressure of  3 mmHg, the estimated right ventricular systolic pressure is 38.4 mmHg. Left Atrium: Left atrial size was normal in size. Right Atrium: Right atrial  size was normal in size. Pericardium: There is a pericardial effusion present, primarily subcostally and adjacent to the RV. The effusion is large subcostally but small around the rest of the heart. The IVC is small, < 25% respirophasic variation in the mitral E inflow velocity,  and there does not appear to be RV diastolic collapse. Tamponade does not appear to be present. Mitral Valve: The mitral valve is normal in structure. No evidence of mitral valve stenosis. Tricuspid Valve: The tricuspid valve is normal in structure. Tricuspid valve regurgitation is trivial. Aortic Valve: The aortic valve is tricuspid. Aortic valve regurgitation is not visualized. No aortic stenosis is present. Pulmonic Valve: The pulmonic valve was normal in structure. Pulmonic valve regurgitation is not visualized. Venous: The inferior vena cava is normal in size with greater than 50% respiratory variability, suggesting right atrial pressure of 3 mmHg. IAS/Shunts: No atrial level shunt detected by color flow Doppler. LEFT VENTRICLE PLAX 2D LVIDd:         4.40 cm LVIDs:         3.10 cm LV PW:         0.70 cm LV IVS:        0.70 cm  LEFT ATRIUM         Index LA diam:  2.70 cm 1.49 cm/m  TRICUSPID VALVE TR Peak grad:   14.6 mmHg TR Vmax:        191.00 cm/s Loralie Champagne MD Electronically signed by Loralie Champagne MD Signature Date/Time: 05/17/2020/9:48:07 AM    Final    US Abdomen Limited RUQ (LIVER/GB)  Result Date: 05/18/2020 CLINICAL DATA:  Cirrhosis.  Ascites. EXAM: ULTRASOUND ABDOMEN LIMITED RIGHT UPPER QUADRANT COMPARISON:  CT 05/16/2020. FINDINGS: Gallbladder: Gallbladder is contracted. No definite gallstones noted. Nodular gallbladder wall with ring down suggesting adenomyomatosis. Gallbladder wall thickness 7 mm. This may be related to adenomyomatosis, hypoproteinemic state, or cholecystitis. Negative Murphy sign. Common bile duct: Diameter: 3.4 mm Liver: Nodular hepatic contour suggesting cirrhosis. No focal hepatic abnormality  identified. Portal vein is patent on color Doppler imaging with normal direction of blood flow towards the liver. Other: Mild ascites.  Bilateral pleural effusions are noted. IMPRESSION: 1. Gallbladder is contracted. No definite gallstones noted. Nodular gallbladder wall with ring down suggesting adenomyomatosis. Gallbladder wall thickness 7 mm. Gallbladder wall thickening may be related to adenomyomatosis, hypoproteinemic state, or cholecystitis. Negative Murphy sign. No biliary distention. 2. Nodular hepatic contour suggesting cirrhosis. No focal hepatic abnormality identified. 3.  Mild ascites.  Bilateral pleural effusions noted. Electronically Signed   By: Marcello Moores  Register   On: 05/18/2020 10:00    Microbiology: No results found for this or any previous visit (from the past 240 hour(s)).   Labs: Basic Metabolic Panel: No results for input(s): NA, K, CL, CO2, GLUCOSE, BUN, CREATININE, CALCIUM, MG, PHOS in the last 168 hours. Liver Function Tests: No results for input(s): AST, ALT, ALKPHOS, BILITOT, PROT, ALBUMIN in the last 168 hours. No results for input(s): LIPASE, AMYLASE in the last 168 hours. No results for input(s): AMMONIA in the last 168 hours. CBC: No results for input(s): WBC, NEUTROABS, HGB, HCT, MCV, PLT in the last 168 hours. Cardiac Enzymes: No results for input(s): CKTOTAL, CKMB, CKMBINDEX, TROPONINI in the last 168 hours. BNP: BNP (last 3 results) Recent Labs    05/16/20 1743  BNP 27.6    ProBNP (last 3 results) No results for input(s): PROBNP in the last 8760 hours.  CBG: No results for input(s): GLUCAP in the last 168 hours.     Signed:  Domenic Polite MD.  [facility] 05/31/2020, 3:19 PM

## 2020-06-01 NOTE — Progress Notes (Signed)
Surgical Instructions    Your procedure is scheduled on 3/21.  Report to [facility] Main Entrance "A" at 5:30 A.M., then check in with the Admitting office.  Call this number if you have problems the morning of surgery:  [phone]   If you have any questions prior to your surgery date call [phone]: Open Monday-Friday 8am-4pm    Remember:  Do not eat or drink after midnight the night before your surgery    Take these medicines the morning of surgery with A SIP OF WATER: NONE  AS NEEDED: acetaminophen (TYLENOL)  cetirizine (ZYRTEC)  famotidine (PEPCID)  Polyethyl Glycol-Propyl Glycol (SYSTANE OP)  As of today, STOP taking any Aspirin (unless otherwise instructed by your surgeon) Aleve, Naproxen, Ibuprofen, Motrin, Advil, Goody's, BC's, all herbal medications, fish oil, and all vitamins.                     Do not wear jewelry, make up, or nail polish            Do not wear lotions, powders, colognes, or deodorant.            Do not shave 48 hours prior to surgery.  Men may shave face and neck.            Do not bring valuables to the hospital.            [facility] is not responsible for any belongings or valuables.  Do NOT Smoke (Tobacco/Vaping) or drink Alcohol 24 hours prior to your procedure If you use a CPAP at night, you may bring all equipment for your overnight stay.   Contacts, glasses, dentures or bridgework may not be worn into surgery, please bring cases for these belongings   For patients admitted to the hospital, discharge time will be determined by your treatment team.   Patients discharged the day of surgery will not be allowed to drive home, and someone needs to stay with them for 24 hours.    Special instructions:   [place]- Preparing For Surgery  Before surgery, you can play an important role. Because skin is not sterile, your skin needs to be as free of germs as possible. You can reduce the number of germs on your skin by washing with CHG  (chlorahexidine gluconate) Soap before surgery.  CHG is an antiseptic cleaner which kills germs and bonds with the skin to continue killing germs even after washing.    Oral Hygiene is also important to reduce your risk of infection.  Remember - BRUSH YOUR TEETH THE MORNING OF SURGERY WITH YOUR REGULAR TOOTHPASTE  Please do not use if you have an allergy to CHG or antibacterial soaps. If your skin becomes reddened/irritated stop using the CHG.  Do not shave (including legs and underarms) for at least 48 hours prior to first CHG shower. It is OK to shave your face.  Please follow these instructions carefully.   1. Shower the NIGHT BEFORE SURGERY and the MORNING OF SURGERY  2. If you chose to wash your hair, wash your hair first as usual with your normal shampoo.  3. After you shampoo, rinse your hair and body thoroughly to remove the shampoo.  4. Wash Face and genitals (private parts) with your normal soap.   5.  Shower the NIGHT BEFORE SURGERY and the MORNING OF SURGERY with CHG Soap.   6. Use CHG Soap as you would any other liquid soap. You can apply CHG directly to the  skin and wash gently with a scrungie or a clean washcloth.   7. Apply the CHG Soap to your body ONLY FROM THE NECK DOWN.  Do not use on open wounds or open sores. Avoid contact with your eyes, ears, mouth and genitals (private parts). Wash Face and genitals (private parts)  with your normal soap.   8. Wash thoroughly, paying special attention to the area where your surgery will be performed.  9. Thoroughly rinse your body with warm water from the neck down.  10. DO NOT shower/wash with your normal soap after using and rinsing off the CHG Soap.  11. Pat yourself dry with a CLEAN TOWEL.  12. Wear CLEAN PAJAMAS to bed the night before surgery  13. Place CLEAN SHEETS on your bed the night before your surgery  14. DO NOT SLEEP WITH PETS.   Day of Surgery: Wear Clean/Comfortable clothing the morning of surgery Do  not apply any deodorants/lotions.   Remember to brush your teeth WITH YOUR REGULAR TOOTHPASTE.   Please read over the following fact sheets that you were given.

## 2020-06-02 ENCOUNTER — Encounter (HOSPITAL_COMMUNITY): Payer: Self-pay

## 2020-06-02 ENCOUNTER — Other Ambulatory Visit: Payer: Self-pay

## 2020-06-02 ENCOUNTER — Ambulatory Visit (HOSPITAL_COMMUNITY)
Admission: RE | Admit: 2020-06-02 | Discharge: 2020-06-02 | Disposition: A | Discharge status: Home / Self Care | Payer: BC Managed Care – PPO | Source: Ambulatory Visit | Attending: Thoracic Surgery (Cardiothoracic Vascular Surgery) | Admitting: Thoracic Surgery (Cardiothoracic Vascular Surgery)

## 2020-06-02 ENCOUNTER — Encounter (HOSPITAL_COMMUNITY)
Admission: RE | Admit: 2020-06-02 | Discharge: 2020-06-02 | Disposition: A | Discharge status: Home / Self Care | Payer: BC Managed Care – PPO | Source: Ambulatory Visit | Attending: Thoracic Surgery (Cardiothoracic Vascular Surgery) | Admitting: Thoracic Surgery (Cardiothoracic Vascular Surgery)

## 2020-06-02 DIAGNOSIS — I313 Pericardial effusion (noninflammatory): Secondary | ICD-10-CM | POA: Diagnosis not present

## 2020-06-02 DIAGNOSIS — J90 Pleural effusion, not elsewhere classified: Secondary | ICD-10-CM

## 2020-06-02 DIAGNOSIS — I3139 Other pericardial effusion (noninflammatory): Secondary | ICD-10-CM

## 2020-06-02 DIAGNOSIS — Z20822 Contact with and (suspected) exposure to covid-19: Secondary | ICD-10-CM | POA: Diagnosis not present

## 2020-06-02 DIAGNOSIS — Z01812 Encounter for preprocedural laboratory examination: Secondary | ICD-10-CM | POA: Insufficient documentation

## 2020-06-02 DIAGNOSIS — J9811 Atelectasis: Secondary | ICD-10-CM | POA: Diagnosis not present

## 2020-06-02 LAB — BLOOD GAS, ARTERIAL
Acid-Base Excess: 0 mmol/L (ref 0.0–2.0)
Bicarbonate: 23.9 mmol/L (ref 20.0–28.0)
FIO2: 21
O2 Saturation: 97.6 %
Patient temperature: 37
pCO2 arterial: 37 mmHg (ref 32.0–48.0)
pH, Arterial: 7.425 (ref 7.350–7.450)
pO2, Arterial: 96.5 mmHg (ref 83.0–108.0)

## 2020-06-02 LAB — COMPREHENSIVE METABOLIC PANEL
ALT: 41 U/L (ref 0–44)
AST: 45 U/L — ABNORMAL HIGH (ref 15–41)
Albumin: 2.9 g/dL — ABNORMAL LOW (ref 3.5–5.0)
Alkaline Phosphatase: 249 U/L — ABNORMAL HIGH (ref 38–126)
Anion gap: 9 (ref 5–15)
BUN: 14 mg/dL (ref 6–20)
CO2: 20 mmol/L — ABNORMAL LOW (ref 22–32)
Calcium: 8.5 mg/dL — ABNORMAL LOW (ref 8.9–10.3)
Chloride: 98 mmol/L (ref 98–111)
Creatinine, Ser: 0.9 mg/dL (ref 0.61–1.24)
GFR, Estimated: >60 mL/min (ref >60)
Glucose, Bld: 111 mg/dL — ABNORMAL HIGH (ref 70–99)
Potassium: 4 mmol/L (ref 3.5–5.1)
Sodium: 127 mmol/L — ABNORMAL LOW (ref 135–145)
Total Bilirubin: 0.8 mg/dL (ref 0.3–1.2)
Total Protein: 6.3 g/dL — ABNORMAL LOW (ref 6.5–8.1)

## 2020-06-02 LAB — CBC
HCT: 43.8 % (ref 39.0–52.0)
Hemoglobin: 15 g/dL (ref 13.0–17.0)
MCH: 31.3 pg (ref 26.0–34.0)
MCHC: 34.2 g/dL (ref 30.0–36.0)
MCV: 91.4 fL (ref 80.0–100.0)
Platelets: 159 10*3/uL (ref 150–400)
RBC: 4.79 MIL/uL (ref 4.22–5.81)
RDW: 14.1 % (ref 11.5–15.5)
WBC: 6.6 10*3/uL (ref 4.0–10.5)
nRBC: 0 % (ref 0.0–0.2)

## 2020-06-02 LAB — TYPE AND SCREEN
ABO/RH(D): O POS
Antibody Screen: NEGATIVE

## 2020-06-02 LAB — URINALYSIS, ROUTINE W REFLEX MICROSCOPIC
Bilirubin Urine: NEGATIVE
Glucose, UA: NEGATIVE mg/dL
Hgb urine dipstick: NEGATIVE
Ketones, ur: 5 mg/dL — AB
Leukocytes,Ua: NEGATIVE
Nitrite: NEGATIVE
Protein, ur: NEGATIVE mg/dL
Specific Gravity, Urine: 1.029 (ref 1.005–1.030)
pH: 5 (ref 5.0–8.0)

## 2020-06-02 LAB — SURGICAL PCR SCREEN
MRSA, PCR: NEGATIVE
Staphylococcus aureus: POSITIVE — AB

## 2020-06-02 LAB — PROTIME-INR
INR: 1.2 (ref 0.8–1.2)
Prothrombin Time: 14.2 seconds (ref 11.4–15.2)

## 2020-06-02 LAB — APTT: aPTT: 34 seconds (ref 24–36)

## 2020-06-02 LAB — SARS CORONAVIRUS 2 (TAT 6-24 HRS): SARS Coronavirus 2: NEGATIVE

## 2020-06-02 NOTE — Progress Notes (Addendum)
PCP: Cari Caraway, MD Cardiologist: Denies  EKG: 05/16/20 CXR: 06/02/20 ECHO: 05/17/20 Stress Test: denies Cardiac Cath: denies  Fasting Blood Sugar- na Checks Blood Sugar_na__ times a day  OSA/CPAP: No  ASA/Blood Thinner: No  Covid test 06/02/20  Anesthesia Review: Yes, Na+ 127  Patient denies shortness of breath, fever, cough, and chest pain at PAT appointment.  Patient verbalized understanding of instructions provided today at the PAT appointment.  Patient asked to review instructions at home and day of surgery.

## 2020-06-04 NOTE — Anesthesia Preprocedure Evaluation (Addendum)
Anesthesia Evaluation  Patient identified by MRN, date of birth, ID band Patient awake    Reviewed: Allergy & Precautions, H&P , NPO status , Patient's Chart, lab work & pertinent test results  Airway Mallampati: II  TM Distance: >3 FB Neck ROM: Full    Dental no notable dental hx. (+) Teeth Intact, Dental Advisory Given   Pulmonary  Pulmonary effusion   Pulmonary exam normal breath sounds clear to auscultation       Cardiovascular Exercise Tolerance: Good  Rhythm:Regular Rate:Normal  Pericardial effusion   Neuro/Psych negative neurological ROS  negative psych ROS   GI/Hepatic negative GI ROS, Neg liver ROS,   Endo/Other  negative endocrine ROS  Renal/GU negative Renal ROS  negative genitourinary   Musculoskeletal   Abdominal   Peds  Hematology  (+) Blood dyscrasia, anemia ,   Anesthesia Other Findings   Reproductive/Obstetrics negative OB ROS                            Anesthesia Physical Anesthesia Plan  ASA: III  Anesthesia Plan: General   Post-op Pain Management:    Induction: Intravenous  PONV Risk Score and Plan: 3 and Ondansetron, Dexamethasone and Midazolam  Airway Management Planned: Double Lumen EBT  Additional Equipment:   Intra-op Plan:   Post-operative Plan: Extubation in OR  Informed Consent: I have reviewed the patients History and Physical, chart, labs and discussed the procedure including the risks, benefits and alternatives for the proposed anesthesia with the patient or authorized representative who has indicated his/her understanding and acceptance.     Dental advisory given  Plan Discussed with: CRNA  Anesthesia Plan Comments:        Anesthesia Quick Evaluation

## 2020-06-05 ENCOUNTER — Other Ambulatory Visit: Payer: Self-pay | Admitting: Thoracic Surgery (Cardiothoracic Vascular Surgery)

## 2020-06-05 ENCOUNTER — Ambulatory Visit (HOSPITAL_COMMUNITY): Payer: BC Managed Care – PPO | Admitting: Anesthesiology

## 2020-06-05 ENCOUNTER — Ambulatory Visit (HOSPITAL_COMMUNITY): Payer: BC Managed Care – PPO

## 2020-06-05 ENCOUNTER — Encounter (HOSPITAL_COMMUNITY): Payer: Self-pay | Admitting: Thoracic Surgery (Cardiothoracic Vascular Surgery)

## 2020-06-05 ENCOUNTER — Other Ambulatory Visit: Payer: Self-pay

## 2020-06-05 ENCOUNTER — Observation Stay (HOSPITAL_COMMUNITY)
Admission: RE | Admit: 2020-06-05 | Discharge: 2020-06-06 | Disposition: A | Discharge status: Home / Self Care | Payer: BC Managed Care – PPO | Attending: Thoracic Surgery (Cardiothoracic Vascular Surgery) | Admitting: Thoracic Surgery (Cardiothoracic Vascular Surgery)

## 2020-06-05 ENCOUNTER — Encounter (HOSPITAL_COMMUNITY)
Admission: RE | Disposition: A | Discharge status: Home / Self Care | Payer: Self-pay | Source: Home / Self Care | Attending: Thoracic Surgery (Cardiothoracic Vascular Surgery)

## 2020-06-05 DIAGNOSIS — C159 Malignant neoplasm of esophagus, unspecified: Secondary | ICD-10-CM | POA: Diagnosis not present

## 2020-06-05 DIAGNOSIS — C7989 Secondary malignant neoplasm of other specified sites: Secondary | ICD-10-CM | POA: Diagnosis not present

## 2020-06-05 DIAGNOSIS — Z79899 Other long term (current) drug therapy: Secondary | ICD-10-CM | POA: Insufficient documentation

## 2020-06-05 DIAGNOSIS — I313 Pericardial effusion (noninflammatory): Secondary | ICD-10-CM | POA: Diagnosis not present

## 2020-06-05 DIAGNOSIS — J910 Malignant pleural effusion: Secondary | ICD-10-CM

## 2020-06-05 DIAGNOSIS — J90 Pleural effusion, not elsewhere classified: Secondary | ICD-10-CM | POA: Diagnosis not present

## 2020-06-05 DIAGNOSIS — C801 Malignant (primary) neoplasm, unspecified: Secondary | ICD-10-CM | POA: Diagnosis not present

## 2020-06-05 DIAGNOSIS — D500 Iron deficiency anemia secondary to blood loss (chronic): Secondary | ICD-10-CM | POA: Diagnosis not present

## 2020-06-05 DIAGNOSIS — Z09 Encounter for follow-up examination after completed treatment for conditions other than malignant neoplasm: Secondary | ICD-10-CM

## 2020-06-05 DIAGNOSIS — C160 Malignant neoplasm of cardia: Secondary | ICD-10-CM | POA: Insufficient documentation

## 2020-06-05 DIAGNOSIS — J9811 Atelectasis: Secondary | ICD-10-CM | POA: Diagnosis not present

## 2020-06-05 DIAGNOSIS — I3139 Other pericardial effusion (noninflammatory): Secondary | ICD-10-CM

## 2020-06-05 DIAGNOSIS — C782 Secondary malignant neoplasm of pleura: Secondary | ICD-10-CM | POA: Diagnosis not present

## 2020-06-05 DIAGNOSIS — C787 Secondary malignant neoplasm of liver and intrahepatic bile duct: Secondary | ICD-10-CM | POA: Diagnosis not present

## 2020-06-05 HISTORY — PX: VIDEO ASSISTED THORACOSCOPY: SHX5073

## 2020-06-05 HISTORY — PX: CHEST TUBE INSERTION: SHX231

## 2020-06-05 HISTORY — PX: PERICARDIAL WINDOW: SHX2213

## 2020-06-05 HISTORY — PX: PLEURAL BIOPSY: SHX5082

## 2020-06-05 SURGERY — VIDEO ASSISTED THORACOSCOPY
Anesthesia: General | Site: Chest

## 2020-06-05 MED ORDER — ENOXAPARIN SODIUM 40 MG/0.4ML ~~LOC~~ SOLN
40.0000 mg | SUBCUTANEOUS | Status: DC
  Administered 2020-06-05: 40 mg via SUBCUTANEOUS
  Filled 2020-06-05: qty 0.4

## 2020-06-05 MED ORDER — HYDROMORPHONE HCL 1 MG/ML IJ SOLN
0.2500 mg | INTRAMUSCULAR | Status: DC | PRN
Start: 2020-06-05 — End: 2020-06-05
  Administered 2020-06-05: 0.5 mg via INTRAVENOUS
  Administered 2020-06-05 (×2): 0.25 mg via INTRAVENOUS

## 2020-06-05 MED ORDER — PHENYLEPHRINE 40 MCG/ML (10ML) SYRINGE FOR IV PUSH (FOR BLOOD PRESSURE SUPPORT)
PREFILLED_SYRINGE | INTRAVENOUS | Status: DC | PRN
  Administered 2020-06-05: 40 ug via INTRAVENOUS
  Administered 2020-06-05: 160 ug via INTRAVENOUS
  Administered 2020-06-05 (×2): 120 ug via INTRAVENOUS

## 2020-06-05 MED ORDER — LACTATED RINGERS IV SOLN: INTRAVENOUS | Status: DC

## 2020-06-05 MED ORDER — ACETAMINOPHEN 10 MG/ML IV SOLN
INTRAVENOUS | Status: DC | PRN
  Administered 2020-06-05: 1000 mg via INTRAVENOUS

## 2020-06-05 MED ORDER — PHENYLEPHRINE 40 MCG/ML (10ML) SYRINGE FOR IV PUSH (FOR BLOOD PRESSURE SUPPORT)
PREFILLED_SYRINGE | INTRAVENOUS | Status: AC
  Filled 2020-06-05: qty 10

## 2020-06-05 MED ORDER — ONDANSETRON HCL 4 MG/2ML IJ SOLN
INTRAMUSCULAR | Status: DC | PRN
  Administered 2020-06-05: 4 mg via INTRAVENOUS

## 2020-06-05 MED ORDER — BUPIVACAINE LIPOSOME 1.3 % IJ SUSP
INTRAMUSCULAR | Status: AC
  Filled 2020-06-05: qty 20

## 2020-06-05 MED ORDER — ACETAMINOPHEN 160 MG/5ML PO SOLN
1000.0000 mg | Freq: Four times a day (QID) | ORAL | Status: DC
  Administered 2020-06-06: 1000 mg via ORAL
  Filled 2020-06-05: qty 40.6

## 2020-06-05 MED ORDER — MIDAZOLAM HCL 2 MG/2ML IJ SOLN
INTRAMUSCULAR | Status: DC | PRN
  Administered 2020-06-05: 2 mg via INTRAVENOUS

## 2020-06-05 MED ORDER — 0.9 % SODIUM CHLORIDE (POUR BTL) OPTIME
TOPICAL | Status: DC | PRN
  Administered 2020-06-05: 2000 mL

## 2020-06-05 MED ORDER — HYDROMORPHONE HCL 1 MG/ML IJ SOLN
INTRAMUSCULAR | Status: AC
  Filled 2020-06-05: qty 1

## 2020-06-05 MED ORDER — FENTANYL CITRATE (PF) 250 MCG/5ML IJ SOLN
INTRAMUSCULAR | Status: AC
  Filled 2020-06-05: qty 5

## 2020-06-05 MED ORDER — PROPOFOL 10 MG/ML IV BOLUS
INTRAVENOUS | Status: AC
  Filled 2020-06-05: qty 40

## 2020-06-05 MED ORDER — ACETAMINOPHEN 500 MG PO TABS
1000.0000 mg | ORAL_TABLET | Freq: Four times a day (QID) | ORAL | Status: DC
  Administered 2020-06-05: 1000 mg via ORAL
  Filled 2020-06-05: qty 2

## 2020-06-05 MED ORDER — CEFAZOLIN SODIUM-DEXTROSE 2-4 GM/100ML-% IV SOLN
INTRAVENOUS | Status: AC
  Filled 2020-06-05: qty 100

## 2020-06-05 MED ORDER — SODIUM CHLORIDE 0.9 % IV SOLN
INTRAVENOUS | Status: DC | PRN
  Administered 2020-06-05: 14 mL

## 2020-06-05 MED ORDER — DEXAMETHASONE SODIUM PHOSPHATE 10 MG/ML IJ SOLN
INTRAMUSCULAR | Status: DC | PRN
  Administered 2020-06-05: 10 mg via INTRAVENOUS

## 2020-06-05 MED ORDER — POLYETHYL GLYCOL-PROPYL GLYCOL 0.4-0.3 % OP GEL: Freq: Every day | OPHTHALMIC | Status: DC | PRN

## 2020-06-05 MED ORDER — SUGAMMADEX SODIUM 200 MG/2ML IV SOLN
INTRAVENOUS | Status: DC | PRN
  Administered 2020-06-05: 200 mg via INTRAVENOUS

## 2020-06-05 MED ORDER — ONDANSETRON HCL 4 MG/2ML IJ SOLN
INTRAMUSCULAR | Status: AC
  Filled 2020-06-05: qty 2

## 2020-06-05 MED ORDER — METOCLOPRAMIDE HCL 5 MG/ML IJ SOLN
10.0000 mg | Freq: Four times a day (QID) | INTRAMUSCULAR | Status: DC
  Administered 2020-06-05 (×2): 10 mg via INTRAVENOUS
  Filled 2020-06-05 (×2): qty 2

## 2020-06-05 MED ORDER — CEFAZOLIN SODIUM-DEXTROSE 2-4 GM/100ML-% IV SOLN
2.0000 g | INTRAVENOUS | Status: AC
  Administered 2020-06-05: 2 g via INTRAVENOUS

## 2020-06-05 MED ORDER — ORAL CARE MOUTH RINSE: 15.0000 mL | Freq: Once | OROMUCOSAL | Status: AC

## 2020-06-05 MED ORDER — FAMOTIDINE 20 MG PO TABS: 20.0000 mg | ORAL_TABLET | Freq: Every day | ORAL | Status: DC | PRN

## 2020-06-05 MED ORDER — SODIUM CHLORIDE 0.45 % IV SOLN: INTRAVENOUS | Status: DC

## 2020-06-05 MED ORDER — BUPIVACAINE HCL (PF) 0.5 % IJ SOLN
INTRAMUSCULAR | Status: AC
  Filled 2020-06-05: qty 30

## 2020-06-05 MED ORDER — LIDOCAINE 2% (20 MG/ML) 5 ML SYRINGE
INTRAMUSCULAR | Status: AC
  Filled 2020-06-05: qty 5

## 2020-06-05 MED ORDER — ROCURONIUM BROMIDE 10 MG/ML (PF) SYRINGE
PREFILLED_SYRINGE | INTRAVENOUS | Status: DC | PRN
  Administered 2020-06-05: 50 mg via INTRAVENOUS
  Administered 2020-06-05: 20 mg via INTRAVENOUS

## 2020-06-05 MED ORDER — LACTATED RINGERS IV SOLN: INTRAVENOUS | Status: DC | PRN

## 2020-06-05 MED ORDER — LORATADINE 10 MG PO TABS: 10.0000 mg | ORAL_TABLET | Freq: Every day | ORAL | Status: DC | PRN

## 2020-06-05 MED ORDER — ROCURONIUM BROMIDE 10 MG/ML (PF) SYRINGE
PREFILLED_SYRINGE | INTRAVENOUS | Status: AC
  Filled 2020-06-05: qty 10

## 2020-06-05 MED ORDER — ETOMIDATE 2 MG/ML IV SOLN
INTRAVENOUS | Status: DC | PRN
  Administered 2020-06-05: 12 mg via INTRAVENOUS

## 2020-06-05 MED ORDER — CHLORHEXIDINE GLUCONATE 0.12 % MT SOLN: 15.0000 mL | Freq: Once | OROMUCOSAL | Status: AC

## 2020-06-05 MED ORDER — PHENYLEPHRINE HCL-NACL 10-0.9 MG/250ML-% IV SOLN
INTRAVENOUS | Status: DC | PRN
  Administered 2020-06-05: 30 ug/min via INTRAVENOUS

## 2020-06-05 MED ORDER — CHLORHEXIDINE GLUCONATE CLOTH 2 % EX PADS
6.0000 | MEDICATED_PAD | Freq: Every day | CUTANEOUS | Status: DC
  Administered 2020-06-05 – 2020-06-06 (×2): 6 via TOPICAL

## 2020-06-05 MED ORDER — ONDANSETRON HCL 4 MG/2ML IJ SOLN: 4.0000 mg | Freq: Four times a day (QID) | INTRAMUSCULAR | Status: DC | PRN

## 2020-06-05 MED ORDER — FENTANYL CITRATE (PF) 100 MCG/2ML IJ SOLN
INTRAMUSCULAR | Status: DC | PRN
  Administered 2020-06-05: 100 ug via INTRAVENOUS

## 2020-06-05 MED ORDER — CHLORHEXIDINE GLUCONATE 0.12 % MT SOLN
OROMUCOSAL | Status: AC
  Administered 2020-06-05: 15 mL via OROMUCOSAL
  Filled 2020-06-05: qty 15

## 2020-06-05 MED ORDER — SPIRONOLACTONE 25 MG PO TABS
25.0000 mg | ORAL_TABLET | Freq: Every day | ORAL | Status: DC
Start: 2020-06-06 — End: 2020-06-06
  Administered 2020-06-06: 25 mg via ORAL
  Filled 2020-06-05: qty 1

## 2020-06-05 MED ORDER — MIDAZOLAM HCL 2 MG/2ML IJ SOLN
INTRAMUSCULAR | Status: AC
  Filled 2020-06-05: qty 2

## 2020-06-05 MED ORDER — SENNOSIDES-DOCUSATE SODIUM 8.6-50 MG PO TABS
1.0000 | ORAL_TABLET | Freq: Every day | ORAL | Status: DC
  Administered 2020-06-05: 1 via ORAL
  Filled 2020-06-05: qty 1

## 2020-06-05 MED ORDER — TRAMADOL HCL 50 MG PO TABS
50.0000 mg | ORAL_TABLET | Freq: Four times a day (QID) | ORAL | Status: DC | PRN
Start: 2020-06-05 — End: 2020-06-06
  Administered 2020-06-05 – 2020-06-06 (×3): 100 mg via ORAL
  Filled 2020-06-05 (×3): qty 2

## 2020-06-05 MED ORDER — DEXAMETHASONE SODIUM PHOSPHATE 10 MG/ML IJ SOLN
INTRAMUSCULAR | Status: AC
  Filled 2020-06-05: qty 1

## 2020-06-05 MED ORDER — BISACODYL 5 MG PO TBEC
10.0000 mg | DELAYED_RELEASE_TABLET | Freq: Every day | ORAL | Status: DC
  Administered 2020-06-06: 10 mg via ORAL
  Filled 2020-06-05: qty 2

## 2020-06-05 MED ORDER — ACETAMINOPHEN 10 MG/ML IV SOLN
INTRAVENOUS | Status: AC
  Filled 2020-06-05: qty 100

## 2020-06-05 MED ORDER — OXYCODONE HCL 5 MG PO TABS: 5.0000 mg | ORAL_TABLET | ORAL | Status: DC | PRN

## 2020-06-05 SURGICAL SUPPLY — 108 items
APPLICATOR COTTON TIP 6 STRL (MISCELLANEOUS) IMPLANT
APPLICATOR COTTON TIP 6IN STRL (MISCELLANEOUS)
APPLIER CLIP ROT 10 11.4 M/L (STAPLE)
BENZOIN TINCTURE PRP APPL 2/3 (GAUZE/BANDAGES/DRESSINGS) ×5 IMPLANT
BLADE CLIPPER SURG (BLADE) ×5 IMPLANT
BLANKET WARM CARDIAC ADLT BAIR (MISCELLANEOUS) ×5 IMPLANT
CANISTER SUCT 3000ML PPV (MISCELLANEOUS) ×5 IMPLANT
CATH THORACIC 28FR (CATHETERS) ×5 IMPLANT
CATH THORACIC 28FR RT ANG (CATHETERS) IMPLANT
CATH THORACIC 36FR (CATHETERS) IMPLANT
CATH THORACIC 36FR RT ANG (CATHETERS) IMPLANT
CLIP APPLIE ROT 10 11.4 M/L (STAPLE) IMPLANT
CLIP VESOCCLUDE MED 6/CT (CLIP) IMPLANT
CNTNR URN SCR LID CUP LEK RST (MISCELLANEOUS) ×8 IMPLANT
CONN Y 3/8X3/8X3/8  BEN (MISCELLANEOUS)
CONN Y 3/8X3/8X3/8 BEN (MISCELLANEOUS) IMPLANT
CONT SPEC 4OZ STRL OR WHT (MISCELLANEOUS) ×2
COVER SURGICAL LIGHT HANDLE (MISCELLANEOUS) ×10 IMPLANT
DERMABOND ADVANCED (GAUZE/BANDAGES/DRESSINGS) ×2
DERMABOND ADVANCED .7 DNX12 (GAUZE/BANDAGES/DRESSINGS) ×8 IMPLANT
DRAIN CHANNEL 28F RND 3/8 FF (WOUND CARE) IMPLANT
DRAIN CHANNEL 32F RND 10.7 FF (WOUND CARE) IMPLANT
DRAPE C-ARM 42X72 X-RAY (DRAPES) ×5 IMPLANT
DRAPE CV SPLIT W-CLR ANES SCRN (DRAPES) ×5 IMPLANT
DRAPE LAPAROSCOPIC ABDOMINAL (DRAPES) ×5 IMPLANT
DRAPE ORTHO SPLIT 77X108 STRL (DRAPES) ×1
DRAPE SLUSH/WARMER DISC (DRAPES) ×5 IMPLANT
DRAPE SURG ORHT 6 SPLT 77X108 (DRAPES) ×4 IMPLANT
DRAPE WARM FLUID 44X44 (DRAPES) IMPLANT
DRSG TEGADERM 4X4.75 (GAUZE/BANDAGES/DRESSINGS) ×5 IMPLANT
ELECT BLADE 6.5 EXT (BLADE) ×5 IMPLANT
ELECT REM PT RETURN 9FT ADLT (ELECTROSURGICAL) ×5
ELECTRODE REM PT RTRN 9FT ADLT (ELECTROSURGICAL) ×4 IMPLANT
GAUZE 4X4 16PLY RFD (DISPOSABLE) ×5 IMPLANT
GAUZE SPONGE 4X4 12PLY STRL (GAUZE/BANDAGES/DRESSINGS) ×10 IMPLANT
GAUZE SPONGE 4X4 12PLY STRL LF (GAUZE/BANDAGES/DRESSINGS) ×5 IMPLANT
GLOVE SURG SIGNA 7.5 PF LTX (GLOVE) ×10 IMPLANT
GLOVE SURG UNDER POLY LF SZ6.5 (GLOVE) ×20 IMPLANT
GLOVE TRIUMPH SURG SIZE 7.5 (KITS) ×5 IMPLANT
GOWN STRL REUS W/ TWL LRG LVL3 (GOWN DISPOSABLE) ×8 IMPLANT
GOWN STRL REUS W/ TWL XL LVL3 (GOWN DISPOSABLE) ×4 IMPLANT
GOWN STRL REUS W/TWL LRG LVL3 (GOWN DISPOSABLE) ×2
GOWN STRL REUS W/TWL XL LVL3 (GOWN DISPOSABLE) ×1
HEMOSTAT SURGICEL 2X14 (HEMOSTASIS) IMPLANT
IV CATH 22GX1 FEP (IV SOLUTION) IMPLANT
KIT BASIN OR (CUSTOM PROCEDURE TRAY) ×5 IMPLANT
KIT PLEURX DRAIN CATH 1000ML (MISCELLANEOUS) ×5 IMPLANT
KIT PLEURX DRAIN CATH 15.5FR (DRAIN) ×5 IMPLANT
KIT SUCTION CATH 14FR (SUCTIONS) ×5 IMPLANT
KIT TURNOVER KIT B (KITS) ×5 IMPLANT
NEEDLE HYPO 25GX1X1/2 BEV (NEEDLE) ×5 IMPLANT
NS IRRIG 1000ML POUR BTL (IV SOLUTION) ×10 IMPLANT
PACK CHEST (CUSTOM PROCEDURE TRAY) ×5 IMPLANT
PACK GENERAL/GYN (CUSTOM PROCEDURE TRAY) ×5 IMPLANT
PAD ARMBOARD 7.5X6 YLW CONV (MISCELLANEOUS) ×10 IMPLANT
PAD ELECT DEFIB RADIOL ZOLL (MISCELLANEOUS) ×5 IMPLANT
POUCH ENDO CATCH II 15MM (MISCELLANEOUS) IMPLANT
POUCH SPECIMEN RETRIEVAL 10MM (ENDOMECHANICALS) IMPLANT
SEALANT PROGEL (MISCELLANEOUS) IMPLANT
SEALANT SURG COSEAL 4ML (VASCULAR PRODUCTS) IMPLANT
SEALANT SURG COSEAL 8ML (VASCULAR PRODUCTS) IMPLANT
SET DRAINAGE LINE (MISCELLANEOUS) IMPLANT
SLEEVE SCD COMPRESS KNEE MED (STOCKING) ×5 IMPLANT
SOL ANTI FOG 6CC (MISCELLANEOUS) ×4 IMPLANT
SOLUTION ANTI FOG 6CC (MISCELLANEOUS) ×1
SPECIMEN JAR MEDIUM (MISCELLANEOUS) ×5 IMPLANT
SPONGE INTESTINAL PEANUT (DISPOSABLE) IMPLANT
SPONGE TONSIL TAPE 1 RFD (DISPOSABLE) ×5 IMPLANT
STAPLER VISISTAT 35W (STAPLE) IMPLANT
STOPCOCK 4 WAY LG BORE MALE ST (IV SETS) ×5 IMPLANT
STRIP CLOSURE SKIN 1/2X4 (GAUZE/BANDAGES/DRESSINGS) ×5 IMPLANT
SUT ETHILON 3 0 FSL (SUTURE) ×5 IMPLANT
SUT ETHILON 3 0 PS 1 (SUTURE) ×5 IMPLANT
SUT PROLENE 4 0 RB 1 (SUTURE)
SUT PROLENE 4-0 RB1 .5 CRCL 36 (SUTURE) IMPLANT
SUT SILK  1 MH (SUTURE) ×2
SUT SILK 1 MH (SUTURE) ×8 IMPLANT
SUT SILK 2 0SH CR/8 30 (SUTURE) IMPLANT
SUT SILK 3 0SH CR/8 30 (SUTURE) IMPLANT
SUT VIC AB 1 CTX 36 (SUTURE) ×2
SUT VIC AB 1 CTX36XBRD ANBCTR (SUTURE) ×8 IMPLANT
SUT VIC AB 2-0 CTX 36 (SUTURE) ×10 IMPLANT
SUT VIC AB 2-0 UR6 27 (SUTURE) ×5 IMPLANT
SUT VIC AB 3-0 MH 27 (SUTURE) IMPLANT
SUT VIC AB 3-0 X1 27 (SUTURE) ×10 IMPLANT
SUT VICRYL 2 TP 1 (SUTURE) IMPLANT
SWAB COLLECTION DEVICE MRSA (MISCELLANEOUS) IMPLANT
SWAB CULTURE ESWAB REG 1ML (MISCELLANEOUS) IMPLANT
SYR 10ML LL (SYRINGE) ×5 IMPLANT
SYR 20ML LL LF (SYRINGE) IMPLANT
SYR 30ML SLIP (SYRINGE) IMPLANT
SYR 50ML LL SCALE MARK (SYRINGE) ×5 IMPLANT
SYR CONTROL 10ML LL (SYRINGE) ×5 IMPLANT
SYSTEM SAHARA CHEST DRAIN ATS (WOUND CARE) ×5 IMPLANT
TAPE CLOTH 4X10 WHT NS (GAUZE/BANDAGES/DRESSINGS) ×5 IMPLANT
TAPE PAPER 3X10 WHT MICROPORE (GAUZE/BANDAGES/DRESSINGS) ×5 IMPLANT
TIP APPLICATOR SPRAY EXTEND 16 (VASCULAR PRODUCTS) IMPLANT
TOWEL GREEN STERILE (TOWEL DISPOSABLE) ×5 IMPLANT
TOWEL GREEN STERILE FF (TOWEL DISPOSABLE) ×5 IMPLANT
TRAP SPECIMEN MUCUS 40CC (MISCELLANEOUS) IMPLANT
TRAY FOLEY MTR SLVR 14FR STAT (SET/KITS/TRAYS/PACK) ×5 IMPLANT
TRAY FOLEY MTR SLVR 16FR STAT (SET/KITS/TRAYS/PACK) ×5 IMPLANT
TROCAR XCEL BLADELESS 5X75MML (TROCAR) ×5 IMPLANT
TROCAR XCEL NON-BLD 5MMX100MML (ENDOMECHANICALS) IMPLANT
TUBING ART PRESS 48 MALE/FEM (TUBING) ×5 IMPLANT
TUBING EXTENTION W/L.L. (IV SETS) ×5 IMPLANT
VALVE REPLACEMENT CAP (MISCELLANEOUS) IMPLANT
WATER STERILE IRR 1000ML POUR (IV SOLUTION) ×10 IMPLANT

## 2020-06-05 NOTE — Anesthesia Postprocedure Evaluation (Signed)
Anesthesia Post Note  Patient: Matthew Reynolds  Procedure(s) Performed: VIDEO ASSISTED THORACOSCOPY (Left Chest) PERICARDIAL WINDOW (N/A Chest) PLEURAL BIOPSY (N/A ) INSERTION PLEURAL DRAINAGE CATHETER (Left )     Patient location during evaluation: PACU Anesthesia Type: General Level of consciousness: awake and alert Pain management: pain level controlled Vital Signs Assessment: post-procedure vital signs reviewed and stable Respiratory status: spontaneous breathing, nonlabored ventilation and respiratory function stable Cardiovascular status: blood pressure returned to baseline and stable Postop Assessment: no apparent nausea or vomiting Anesthetic complications: no   No complications documented.  Last Vitals:  Vitals:   06/05/20 1125 06/05/20 1155  BP: 111/81 120/84  Pulse: (!) 102 (!) 104  Resp: 20 20  Temp:    SpO2: 95% 97%    Last Pain:  Vitals:   06/05/20 1125  TempSrc:   PainSc: 3                  Gracia Saggese,W. EDMOND

## 2020-06-05 NOTE — Discharge Summary (Addendum)
Physician Discharge Summary  Patient ID: Matthew Reynolds MRN: [id number] DOB/AGE: 1966-02-07 55 y.o.  Admit date: 06/05/2020 Discharge date: 06/06/2020  Admission Diagnoses:  Patient Active Problem List   Diagnosis Date Noted  . Pleural effusion, malignant 06/05/2020  . Pleural effusion 05/17/2020  . Bilateral pleural effusion 05/17/2020  . Pericardial effusion 05/16/2020  . Iron deficiency anemia due to chronic blood loss 12/11/2017  . Adenocarcinoma of cardio-esophageal junction ([place]) 12/10/2017  . Malignant neoplasm metastatic to liver ([place]) 12/10/2017  . Goals of care, counseling/discussion 12/10/2017  . 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 Pleural effusion, malignant 06/05/2020  . S/P Left VATS with drainage of pleural effusion, pleural biopsy, creation of pericardial window, placement of pleur-x catheter 06/05/2020  . Pleural effusion 05/17/2020  . Bilateral pleural effusion 05/17/2020  . Pericardial effusion 05/16/2020  . Iron deficiency anemia due to chronic blood loss 12/11/2017  . Adenocarcinoma of cardio-esophageal junction ([place]) 12/10/2017  . Malignant neoplasm metastatic to liver ([place]) 12/10/2017  . Goals of care, counseling/discussion 12/10/2017  . 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  He was diagnosed in 2019.  He was treated with chemotherapy and then had radiation.  He was currently on maintenance therapy with Enhertu.   He recently had a follow-up scan which showed new bilateral pleural effusions left greater than right, pericardial effusion, and ascites.  He had not been having  any significant shortness of breath or swelling in his legs.  He did complain of some abdominal fullness and sensation of discomfort in his left upper quadrant/ left costal margin area.  He was admitted to the hospital on 05/17/2020.  He had a thoracentesis which drained 1.2 L of fluid.  Fluid was consistent with an exudate.  Cytology was positive for metastatic adenocarcinoma.  He was referred to [facility] for more definitive management of his pericardial and pleural effusions.  He was evaluated by Dr. Roxan Hockey who felt the patient would benefit from VATS procedure with drainage of pleural effusion, pleural biopsy, creation of pericardial window and placement of a pleur-x catheter.  The risks and benefits of the procedure were explained to the patient and he was agreeable to proceed.   Hospital Course:  Mr. Samek presented to [facility] on 06/05/2020.  He was taken to the operating room and underwent Left Video Assisted Thoracoscopy with Drainage of pleural effusion, pleural biopsy, creation of pericardial window with drainage of effusion, and placement of Pleur-x catheter.  He tolerated the procedure without difficulty, was extubated and taken to the PACU in stable condition.  The patient's pleur-x catheter was hooked to a Pleuro-vac overnight.  Follow up CXR showed stable appearance of right pleural effusion, left sided effusion improved.  Chest tube output since surgery was greater than 1100.   He will require drainage of his pleur-x catheter every day until output decreases.  The patient has remained hemodynamically stable overnight.  He is ambulating.  His surgical incisions are healing without infection.  Home health arrangements have been made.  He is medically stable for discharge home today.  Significant Diagnostic Studies: radiology: CT CAP  IMPRESSION: Chest Impression:  1.  New pericardial effusion and moderate bilateral pleural effusions. 2. No evidence  of esophageal cancer recurrence. Stable thickening through the distal esophagus. 3. No pulmonary metastasis or mediastinal adenopathy.  Abdomen / Pelvis Impression:  1. No metastatic disease in the abdomen pelvis. 2. New intraperitoneal free fluid in the abdomen pelvis. 3. With new pericardial effusion, pleural effusions, and intraperitoneal free fluid query cardiac dysfunction or cardiac toxicity.  Treatments: surgery:   PREOPERATIVE DIAGNOSIS:  Malignant left pleural effusion and pericardial effusion.  POSTOPERATIVE DIAGNOSIS:  Malignant left pleural effusion and pericardial effusion.  PROCEDURES:  Left video-assisted thoracoscopy, drainage of pleural effusion, pleural biopsy, pericardial window, and insertion of pleural drainage catheter.  PATHOLOGY: Results are pending  Discharge Exam: Blood pressure 106/75, pulse 94, temperature 97.8 F (36.6 C), temperature source Oral, resp. rate (!) 22, height 5\' 9"  (1.753 m), weight 65.8 kg, SpO2 95 %.  General appearance: alert, cooperative and no distress Heart: regular rate and rhythm Lungs: diminshed on right Abdomen: soft, non-tender; bowel sounds normal; no masses,  no organomegaly Wound: clean and dry  Discharge disposition: 01-Home or Self Care   Discharge Instructions    Discharge patient   Complete by: As directed    Once the pleurx bottles have been obtained   Discharge disposition: 01-Home or Self Care   Discharge patient date: 06/06/2020   Pleural Drainage Schedule   Complete by: As directed    Drain up to max of 1L until patient is only able to drain out 142ml. If <173ml for 3 consecutive drains then drain every other day. If <179ml for 3 consecutive drains every other day then call the practice that inserted the catheter for evaluation and possible removal. [facility] office [phone])     Allergies as of 06/06/2020   No Known Allergies     Medication List    STOP taking these medications   Dexilant 60 MG  capsule Generic drug: dexlansoprazole   furosemide 40 MG tablet Commonly known as: LASIX     TAKE these medications   acetaminophen 500 MG tablet Commonly known as: TYLENOL Take 500 mg by mouth every 6 (six) hours as needed for mild pain.   cetirizine 10 MG tablet Commonly known as: ZYRTEC Take 10 mg by mouth daily as needed for allergies.   famotidine 20 MG tablet Commonly known as: PEPCID Take 20 mg by mouth daily as needed for heartburn or indigestion.   MULTI-VITAMIN DAILY PO Take 1 tablet by mouth daily.   spironolactone 25 MG tablet Commonly known as: ALDACTONE Take 1 tablet (25 mg total) by mouth daily.   SYSTANE OP Place 1 drop into both eyes daily as needed (dry eyes).   traMADol 50 MG tablet Commonly known as: ULTRAM Take 1-2 tablets (50-100 mg total) by mouth every 6 (six) hours as needed (mild pain).       Follow-up Information    [name], MD Follow up on 06/20/2020.   Specialty: Cardiothoracic Surgery Why: Appointment is at 9:15, please get CXR at 8:45 at [facility] located on first floor of our office building Contact information: [place] [address] [phone]        Care, [facility] Follow up.   Specialty: Home Health Services Why: [facility] Contact information: [address] [place] [address] [phone]               Signed: Ellwood Handler, PA-C  06/06/2020, 12:31 PM

## 2020-06-05 NOTE — Brief Op Note (Addendum)
06/05/2020  9:25 AM  PATIENT:  Therisa Doyne  55 y.o. male  PRE-OPERATIVE DIAGNOSIS:  LEFT PLEURAL EFFUSION PERICARDIAL EFFUSION  POST-OPERATIVE DIAGNOSIS:  LEFT PLEURAL EFFUSION PERICARDIAL EFFUSION  PROCEDURE:  Procedure(s):  VIDEO ASSISTED THORACOSCOPY (Left) PERICARDIAL WINDOW (N/A) PLEURAL BIOPSY (N/A) DRAINAGE OF PLEURAL EFFUSION INSERTION PLEURAL DRAINAGE CATHETER (Left)  SURGEON:  Surgeon(s) and Role:    * [name], MD - Primary  PHYSICIAN ASSISTANT: Ellwood Handler PA-C  ANESTHESIA:   general  EBL: Minimal  BLOOD ADMINISTERED:none  DRAINS: Pleur-x catheter in left chest, hooked to pleuro vac   LOCAL MEDICATIONS USED:  MARCAINE     SPECIMEN:  Source of Specimen:  Pericardial Fluid, Pleura, Pericardium  DISPOSITION OF SPECIMEN:  Pathology, Cytology  COUNTS:  YES  TOURNIQUET:  * No tourniquets in log *  DICTATION: .Dragon Dictation  PLAN OF CARE: Admit to inpatient   PATIENT DISPOSITION:  PACU - hemodynamically stable.   Delay start of Pharmacological VTE agent (>24hrs) due to surgical blood loss or risk of bleeding: no

## 2020-06-05 NOTE — Progress Notes (Signed)
Complained of difficulty swallowing whole pills. Crushed and mix with soft food.

## 2020-06-05 NOTE — Op Note (Signed)
NAME: Matthew Reynolds, Matthew Reynolds MEDICAL RECORD NO: [id number] ACCOUNT NO: [id number] DATE OF BIRTH: 1965/05/27 FACILITY: [facility] LOCATION: [facility] PHYSICIAN: Revonda Standard. Roxan Hockey, MD  Operative Report   DATE OF PROCEDURE: 06/05/2020  PREOPERATIVE DIAGNOSIS:  Malignant left pleural effusion and pericardial effusion.  POSTOPERATIVE DIAGNOSIS:  Malignant left pleural effusion and pericardial effusion.  PROCEDURES:   Left video-assisted thoracoscopy,  Drainage of pleural effusion,  Pleural biopsy,  Pericardial window, and  Insertion of pleural drainage catheter.  SURGEON:  Revonda Standard. Roxan Hockey, MD  ASSISTANT:  Ellwood Handler, PA  ANESTHESIA:  General.  FINDINGS:  1500 mL of serous fluid evacuated from the pleural space, tumor infiltration of the pleural surface, pericardium thickened, 250 mL of bloody fluid evacuated from the pericardium.  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  He recently was found to have bilateral pleural effusions and pericardial  effusion and ascites.  He had undergone thoracentesis, which was positive for metastatic adenocarcinoma.  He now presents for more definitive management with palliation of his pleural effusion as well as drainage of a moderate-to-large pericardial  effusion.  The indications, risks, benefits, and alternatives were discussed in detail with the patient.  He understood and accepted the risks and agreed to proceed.  OPERATIVE NOTE:  Matthew Reynolds was brought to the preoperative holding area on 06/05/2020.  Anesthesia established intravenous access and placed an arterial blood pressure monitoring line.  He was taken to the operating room and anesthetized and intubated  with a double-lumen endotracheal tube.  Intravenous antibiotics were administered.  Sequential compression devices were placed on the calves for DVT  prophylaxis.  He was placed in a right lateral decubitus position, and the left chest and upper abdomen were prepped and draped in the usual sterile fashion.  A Bair Hugger was in place for active warming.  Single lung ventilation of the right lung was initiated and was tolerated well throughout the procedure.  A timeout was performed.  An incision was made in the ninth interspace posteriorly.  This was approximately a 1 cm incision.  The chest was bluntly entered using a hemostat.  Sucker was placed in the chest and 1.5 liters of serous fluid was evacuated.  A  3 cm working incision was made in the eighth interspace anterolaterally and was carried through the skin and subcutaneous tissue.  The intercostal muscles were divided.  Placing the scope through the posterior incision, the pericardium was visualized.   The phrenic nerve was identified and the pericardium was incised posterior to the phrenic nerve.  A pericardial window approximately 2 x 2 cm then was created.  On incising the pericardium, there was bloody pericardial fluid, which was evacuated.  There  was about 250 mL of fluid present.  The pericardium and a biopsy from the parietal pleura were sent for permanent pathology.  The sucker was advanced into the pericardial space to ensure that all the fluid was drained.  A pleural drainage catheter  then was tunneled from an incision in the left upper quadrant subcutaneously and then placed via a separate incision in approximately the ninth interspace inside the chest.  The tip of the catheter was advanced through the window in the pericardium.  The  remainder of the catheter was then placed in the pleural space.  The catheter was secured at the exit site with a 3-0 nylon suture.  Dual lung ventilation  was resumed.  There was incomplete re-expansion of the lower lobe.  The scope was removed.  The  incisions were closed in standard fashion.  Dermabond was applied to the incisions.  The pleural catheter  was placed to a Pleur-Evac on water seal.  All sponge, needle, and instrument counts were correct at the end of the procedure.  The patient was  extubated in the operating room and taken to the postanesthetic care unit in good condition.   ROH D: 06/05/2020 2:45:56 pm T: 06/05/2020 5:03:00 pm  JOB: [id number]/ [id number]

## 2020-06-05 NOTE — Progress Notes (Signed)
Admission from PACU by bed awake and alert.

## 2020-06-05 NOTE — Interval H&P Note (Signed)
History and Physical Interval Note:  06/05/2020 7:08 AM  Matthew Reynolds  has presented today for surgery, with the diagnosis of LEFT PLEURAL EFFUSION PERICARDIAL EFFUSION.  The various methods of treatment have been discussed with the patient and family. After consideration of risks, benefits and other options for treatment, the patient has consented to  Procedure(s): VIDEO ASSISTED THORACOSCOPY (Left) PERICARDIAL WINDOW (N/A) PLEURAL BIOPSY (N/A) INSERTION PLEURAL DRAINAGE CATHETER (Left) as a surgical intervention.  The patient's history has been reviewed, patient examined, no change in status, stable for surgery.  I have reviewed the patient's chart and labs.  Questions were answered to the patient's satisfaction.     [name]

## 2020-06-05 NOTE — Transfer of Care (Signed)
Immediate Anesthesia Transfer of Care Note  Patient: Matthew Reynolds  Procedure(s) Performed: VIDEO ASSISTED THORACOSCOPY (Left Chest) PERICARDIAL WINDOW (N/A Chest) PLEURAL BIOPSY (N/A ) INSERTION PLEURAL DRAINAGE CATHETER (Left )  Patient Location: PACU  Anesthesia Type:General  Level of Consciousness: awake, alert  and oriented  Airway & Oxygen Therapy: Patient Spontanous Breathing and Patient connected to face mask oxygen  Post-op Assessment: Report given to RN, Post -op Vital signs reviewed and stable and Patient moving all extremities  Post vital signs: Reviewed and stable  Last Vitals:  Vitals Value Taken Time  BP 110/77 06/05/20 0940  Temp 36.1 C 06/05/20 0940  Pulse 96 06/05/20 0949  Resp 23 06/05/20 0949  SpO2 98 % 06/05/20 0949  Vitals shown include unvalidated device data.  Last Pain:  Vitals:   06/05/20 0648  TempSrc:   PainSc: 0-No pain      Patients Stated Pain Goal: 3 (81/84/03 7543)  Complications: No complications documented.

## 2020-06-05 NOTE — Hospital Course (Addendum)
History of Present Illness:  Mr. 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  He was diagnosed in 2019.  He was treated with chemotherapy and then had radiation.  He was currently on maintenance therapy with Enhertu.   He recently had a follow-up scan which showed new bilateral pleural effusions left greater than right, pericardial effusion, and ascites.  He had not been having any significant shortness of breath or swelling in his legs.  He did complain of some abdominal fullness and sensation of discomfort in his left upper quadrant/ left costal margin area.  He was admitted to the hospital on 05/17/2020.  He had a thoracentesis which drained 1.2 L of fluid.  Fluid was consistent with an exudate.  Cytology was positive for metastatic adenocarcinoma.  He was referred to [facility] for more definitive management of his pericardial and pleural effusions.  He was evaluated by Dr. Roxan Hockey who felt the patient would benefit from VATS procedure with drainage of pleural effusion, pleural biopsy, creation of pericardial window and placement of a pleur-x catheter.  The risks and benefits of the procedure were explained to the patient and he was agreeable to proceed.   Hospital Course:  Mr. Votta presented to [facility] on 06[facility]05/2020.  He was taken to the operating room and underwent Left Video Assisted Thoracoscopy with Drainage of pleural effusion, pleural biopsy, creation of pericardial window with drainage of effusion, and placement of Pleur-x catheter.  He tolerated the procedure without difficulty, was extubated and taken to the PACU in stable condition.  The patient's pleur-x catheter was hooked to a Pleuro-vac overnight.  Follow up CXR showed stable appearance of right pleural effusion, left sided effusion improved.  Chest tube output since surgery was greater than 1100.   He will require drainage  of his pleur-x catheter every day until output decreases.  The patient has remained hemodynamically stable overnight.  He is ambulating.  His surgical incisions are healing without infection.  Home health arrangements have been made.  He is medically stable for discharge home today.

## 2020-06-06 ENCOUNTER — Encounter (HOSPITAL_COMMUNITY): Payer: Self-pay | Admitting: Thoracic Surgery (Cardiothoracic Vascular Surgery)

## 2020-06-06 ENCOUNTER — Inpatient Hospital Stay (HOSPITAL_COMMUNITY): Payer: BC Managed Care – PPO

## 2020-06-06 ENCOUNTER — Other Ambulatory Visit (HOSPITAL_COMMUNITY): Payer: Self-pay | Admitting: Physician Assistant

## 2020-06-06 DIAGNOSIS — I313 Pericardial effusion (noninflammatory): Secondary | ICD-10-CM | POA: Diagnosis not present

## 2020-06-06 DIAGNOSIS — C159 Malignant neoplasm of esophagus, unspecified: Secondary | ICD-10-CM | POA: Diagnosis not present

## 2020-06-06 DIAGNOSIS — Z79899 Other long term (current) drug therapy: Secondary | ICD-10-CM | POA: Diagnosis not present

## 2020-06-06 DIAGNOSIS — J439 Emphysema, unspecified: Secondary | ICD-10-CM | POA: Diagnosis not present

## 2020-06-06 DIAGNOSIS — C160 Malignant neoplasm of cardia: Secondary | ICD-10-CM | POA: Diagnosis not present

## 2020-06-06 DIAGNOSIS — J90 Pleural effusion, not elsewhere classified: Secondary | ICD-10-CM | POA: Diagnosis not present

## 2020-06-06 DIAGNOSIS — C787 Secondary malignant neoplasm of liver and intrahepatic bile duct: Secondary | ICD-10-CM | POA: Diagnosis not present

## 2020-06-06 DIAGNOSIS — J910 Malignant pleural effusion: Secondary | ICD-10-CM | POA: Diagnosis not present

## 2020-06-06 LAB — CBC
HCT: 38.9 % — ABNORMAL LOW (ref 39.0–52.0)
Hemoglobin: 13.5 g/dL (ref 13.0–17.0)
MCH: 31.4 pg (ref 26.0–34.0)
MCHC: 34.7 g/dL (ref 30.0–36.0)
MCV: 90.5 fL (ref 80.0–100.0)
Platelets: 168 10*3/uL (ref 150–400)
RBC: 4.3 MIL/uL (ref 4.22–5.81)
RDW: 13.8 % (ref 11.5–15.5)
WBC: 9.1 10*3/uL (ref 4.0–10.5)
nRBC: 0 % (ref 0.0–0.2)

## 2020-06-06 LAB — BASIC METABOLIC PANEL
Anion gap: 4 — ABNORMAL LOW (ref 5–15)
BUN: 13 mg/dL (ref 6–20)
CO2: 26 mmol/L (ref 22–32)
Calcium: 8.3 mg/dL — ABNORMAL LOW (ref 8.9–10.3)
Chloride: 102 mmol/L (ref 98–111)
Creatinine, Ser: 0.94 mg/dL (ref 0.61–1.24)
GFR, Estimated: >60 mL/min (ref >60)
Glucose, Bld: 148 mg/dL — ABNORMAL HIGH (ref 70–99)
Potassium: 4.3 mmol/L (ref 3.5–5.1)
Sodium: 132 mmol/L — ABNORMAL LOW (ref 135–145)

## 2020-06-06 LAB — CYTOLOGY - NON PAP

## 2020-06-06 LAB — SURGICAL PATHOLOGY

## 2020-06-06 MED ORDER — TRAMADOL HCL 50 MG PO TABS: 50.0000 mg | ORAL_TABLET | Freq: Four times a day (QID) | ORAL | 0 refills | Status: DC | PRN

## 2020-06-06 MED FILL — traMADol HCL 50 MG TABS: 50 | 4 days supply | Qty: 30 | Fill #0

## 2020-06-06 NOTE — Discharge Instructions (Signed)
Daily Pleurix drainage to start tomorrow  If <150 ml /drainage session x3 consecutive occasions - QOD drainage If <150 ml /drainage session x3 consecutive occasions on  QOD drainage schedule- call [facility] office  [phone]) for evaluation and possible removal   Video-Assisted Thoracic Surgery, Care After The following information offers guidance on how to care for yourself after your procedure. Your health care provider may also give you more specific instructions. If you have problems or questions, contact your health care provider. What can I expect after the procedure? After the procedure, it is common to have:  Some pain and soreness in your chest.  Pain when breathing in (inhaling) or coughing.  Constipation.  Tiredness.  Trouble sleeping. Follow these instructions at home: Preventing pneumonia  Do deep breathing exercises and cough regularly as directed. This helps clear mucus and opens your lungs. Doing this helps prevent lung infection (pneumonia).  If you were given an incentive spirometer, use it as told. An incentive spirometer is a tool that measures how well you are filling your lungs with each breath.  Coughing may hurt less if you try to support your chest. This is called splinting. Try one of these when you cough: ? Hold a pillow against your chest. ? Place the palms of both hands on top of your incision area.  Do not use any products that contain nicotine or tobacco. These products include cigarettes, chewing tobacco, and vaping devices, such as e-cigarettes. If you need help quitting, ask your health care provider.  Avoid secondhand smoke.   Medicines  Take over-the-counter and prescription medicines only as told by your health care provider.  If you have pain, take pain medicine before your pain becomes severe. This is important. If your pain is under control, you will be able to breathe and cough more comfortably.  If you were prescribed an antibiotic  medicine, take it as told by your health care provider. Do not stop taking the antibiotic even if you start to feel better.  Ask your health care provider if the medicine prescribed to you: ? Requires you to avoid driving or using machinery. ? Can cause constipation. You may need to take these actions to prevent or treat constipation:  Drink enough fluid to keep your urine pale yellow.  Take over-the-counter or prescription medicines.  Eat foods that are high in fiber, such as beans, whole grains, and fresh fruits and vegetables.  Limit foods that are high in fat and processed sugars, such as fried or sweet foods. Bathing  Do not take baths, swim, or use a hot tub until your health care provider approves.  Ask your health care provider if you may take showers. You may only be allowed to take sponge baths. Incision care  Follow instructions from your health care provider about how to take care of your incision area. Make sure you: ? Wash your hands with soap and water for at least 20 seconds before and after you change your bandage (dressing). If soap and water are not available, use hand sanitizer. ? Change your dressing as told by your health care provider. ? Leave stitches (sutures), skin glue, staples, or adhesive strips in place. These skin closures may need to stay in place for 2 weeks or longer. If adhesive strip edges start to loosen and curl up, you may trim the loose edges. Do not remove adhesive strips completely unless your health care provider tells you to do that.  Keep your dressing dry until it  has been removed.  Check your incision area every day for signs of infection. Check for: ? Redness, swelling, or more pain. ? Fluid or blood. ? Warmth. ? Pus or a bad smell.   Activity  Avoid activities that use your chest muscles for at least 3-4 weeks.  Do not lift anything that is heavier than 10 lb (4.5 kg), or the limit that you are told, until your health care provider  says that it is safe.  Return to your normal activities as told by your health care provider. Ask your health care provider what activities are safe for you.  Rest as told by your health care provider.  Avoid sitting for a long time without moving. Get up to take short walks every 1-2 hours. This is important to improve blood flow and breathing. Ask for help if you feel weak or unsteady.  Do exercises as told by your health care provider. General instructions  If you were given a sedative during the procedure, it can affect you for several hours. Do not drive or operate machinery until your health care provider says that it is safe.  If you have a chest tube, care for it as told. Do not travel by airplane during the 2 weeks after your chest tube is removed, or until your health care provider says that this is safe.  Keep all follow-up visits. This is important. Contact a health care provider if:  You have any of these signs of infection: ? Redness, swelling, or more pain around any incision. ? Fluid or blood coming from any incision. ? Warmth coming from any incision. ? Pus or a bad smell coming from any incision. ? A fever or chills.  You have nausea or vomiting.  You have pain that does not get better with medicine. Get help right away if:  You have chest pain.  You have fast or irregular heartbeats.  You develop a rash.  You have shortness of breath or trouble breathing.  You are confused.  You have trouble speaking.  You feel weak, light-headed, or dizzy, or you faint. These symptoms may represent a serious problem that is an emergency. Do not wait to see if the symptoms will go away. Get medical help right away. Call your local emergency services (911 in the [place] Do not drive yourself to the hospital. Summary  Take deep breaths and cough often. This helps clear mucus and opens your lungs. Doing this helps prevent lung infection (pneumonia).  If you have pain,  take pain medicine before your pain becomes severe. This is important. If your pain is under control, you will be able to breathe and cough more comfortably.  Check your incision area every day for signs of infection. Contact your health care provider if you have any signs of infection.  Ask your health care provider what activities are safe for you. This information is not intended to replace advice given to you by your health care provider. Make sure you discuss any questions you have with your health care provider. Document Revised: 11/26/2019 Document Reviewed: 11/26/2019 Elsevier Patient Education  2021 [employer].

## 2020-06-06 NOTE — TOC Transition Note (Signed)
Transition of Care [facility]) - CM/SW Discharge Note   Patient Details  Name: Matthew Reynolds MRN: [id number] Date of Birth: 07-17-1965  Transition of Care [facility]) CM/SW Contact:  Zenon Mayo, RN Phone Number: 06/06/2020, 9:54 AM   Clinical Narrative:    Patient is for dc today, has pleurx drain, NCM offered choice, patient has no preference.  NCM made referral to [facility] with Alvis Lemmings, he is able to take referral but soc will be in 2 to 3 days so patient wife needs education on draining the pleurx drain.     Final next level of care: [place] Barriers to Discharge: No Barriers Identified   Patient Goals and CMS Choice Patient states their goals for this hospitalization and ongoing recovery are:: home with [facility] CMS Medicare.gov Compare Post Acute Care list provided to:: Patient Choice offered to / list presented to : Patient  Discharge Placement                       Discharge Plan and Services                  DME Agency: NA       HH Arranged: RN [facility] Agency: Patrick Date [facility] Agency Contacted: 06/06/20 Time [place]: [phone] Representative spoke with at [place]: [place] ([place]) Interventions     Readmission Risk Interventions No flowsheet data found.

## 2020-06-06 NOTE — Progress Notes (Addendum)
      [address]       [place] [address]             [phone]      1 Day Post-Op Procedure(s) (LRB): VIDEO ASSISTED THORACOSCOPY (Left) PERICARDIAL WINDOW (N/A) PLEURAL BIOPSY (N/A) INSERTION PLEURAL DRAINAGE CATHETER (Left)   Subjective:  Sitting up in bed. Doing pretty well, hoping to go home today.  There was some documentation in regards to difficulty swallowing pills.  This is a chronic issue for the patient and this has been present since completion of radiation.  Objective: Vital signs in last 24 hours: Temp:  [97 F (36.1 C)-98.3 F (36.8 C)] 97.9 F (36.6 C) (03/22 0300) Pulse Rate:  [84-104] 91 (03/22 0411) Cardiac Rhythm: Normal sinus rhythm (03/21 2000) Resp:  [13-29] 18 (03/22 0411) BP: (80-122)/(66-86) 100/73 (03/22 0411) SpO2:  [92 %-100 %] 92 % (03/22 0411)  Intake/Output from previous day: 03/21 0701 - 03/22 0700 In: 1775 [P.O.:300; I.V.:1475] Out: 53 [Urine:500; Chest Tube:1100]  General appearance: alert, cooperative and no distress Heart: regular rate and rhythm Lungs: diminshed on right Abdomen: soft, non-tender; bowel sounds normal; no masses,  no organomegaly Wound: clean and dry  Lab Results: Recent Labs    06/06/20 0048  WBC 9.1  HGB 13.5  HCT 38.9*  PLT 168   BMET:  Recent Labs    06/06/20 0048  NA 132*  K 4.3  CL 102  CO2 26  GLUCOSE 148*  BUN 13  CREATININE 0.94  CALCIUM 8.3*    PT/INR: No results for input(s): LABPROT, INR in the last 72 hours. ABG    Component Value Date/Time   PHART 7.425 06/02/2020 0842   HCO3 23.9 06/02/2020 0842   O2SAT 97.6 06/02/2020 0842   CBG (last 3)  No results for input(s): GLUCAP in the last 72 hours.  Assessment/Plan: S/P Procedure(s) (LRB): VIDEO ASSISTED THORACOSCOPY (Left) PERICARDIAL WINDOW (N/A) PLEURAL BIOPSY (N/A) INSERTION PLEURAL DRAINAGE CATHETER (Left)  1. CV- hemodynamically stable 2. Pulm- CXR with stable appearance of right pleural  effusion, left side improved... CT has drained more than 1100 since surgery, will plan daily drainage of Pleur-x catheter starting tomorrow 3. Renal- creatinine/lytes okay 4. Dysphagia- chronic since completion of radiation, no need for SLP 5. Dispo- patient stable, will plan to d/c home today with daily drainage of pleur-x catheter if this has been arranged   LOS: 1 day    Ellwood Handler, PA-C 06/06/2020 Patient seen and examined, agree with above Drainage is serous. CXR OK DC home Start daily drainage of catheter tomorrow  Remo Lipps C. Roxan Hockey, MD [facility] [phone]

## 2020-06-08 DIAGNOSIS — D500 Iron deficiency anemia secondary to blood loss (chronic): Secondary | ICD-10-CM | POA: Diagnosis not present

## 2020-06-08 DIAGNOSIS — C787 Secondary malignant neoplasm of liver and intrahepatic bile duct: Secondary | ICD-10-CM | POA: Diagnosis not present

## 2020-06-08 DIAGNOSIS — Z48813 Encounter for surgical aftercare following surgery on the respiratory system: Secondary | ICD-10-CM | POA: Diagnosis not present

## 2020-06-08 DIAGNOSIS — M4316 Spondylolisthesis, lumbar region: Secondary | ICD-10-CM | POA: Diagnosis not present

## 2020-06-08 DIAGNOSIS — D630 Anemia in neoplastic disease: Secondary | ICD-10-CM | POA: Diagnosis not present

## 2020-06-08 DIAGNOSIS — Z4801 Encounter for change or removal of surgical wound dressing: Secondary | ICD-10-CM | POA: Diagnosis not present

## 2020-06-08 DIAGNOSIS — C160 Malignant neoplasm of cardia: Secondary | ICD-10-CM | POA: Diagnosis not present

## 2020-06-08 DIAGNOSIS — I313 Pericardial effusion (noninflammatory): Secondary | ICD-10-CM | POA: Diagnosis not present

## 2020-06-08 DIAGNOSIS — J910 Malignant pleural effusion: Secondary | ICD-10-CM | POA: Diagnosis not present

## 2020-06-08 DIAGNOSIS — J9811 Atelectasis: Secondary | ICD-10-CM | POA: Diagnosis not present

## 2020-06-08 DIAGNOSIS — G629 Polyneuropathy, unspecified: Secondary | ICD-10-CM | POA: Diagnosis not present

## 2020-06-09 DIAGNOSIS — J910 Malignant pleural effusion: Secondary | ICD-10-CM | POA: Diagnosis not present

## 2020-06-14 ENCOUNTER — Encounter: Payer: Self-pay | Admitting: *Deleted

## 2020-06-14 NOTE — Progress Notes (Signed)
[facility] sent on Patient recent 06/05/2020 pathology from pleura and pericardium biopsy

## 2020-06-15 ENCOUNTER — Other Ambulatory Visit: Payer: Self-pay | Admitting: Hematology & Oncology

## 2020-06-15 ENCOUNTER — Other Ambulatory Visit: Payer: Self-pay | Admitting: *Deleted

## 2020-06-15 DIAGNOSIS — Z48813 Encounter for surgical aftercare following surgery on the respiratory system: Secondary | ICD-10-CM | POA: Diagnosis not present

## 2020-06-15 DIAGNOSIS — C787 Secondary malignant neoplasm of liver and intrahepatic bile duct: Secondary | ICD-10-CM | POA: Diagnosis not present

## 2020-06-15 DIAGNOSIS — C160 Malignant neoplasm of cardia: Secondary | ICD-10-CM | POA: Diagnosis not present

## 2020-06-15 DIAGNOSIS — I313 Pericardial effusion (noninflammatory): Secondary | ICD-10-CM | POA: Diagnosis not present

## 2020-06-15 DIAGNOSIS — D500 Iron deficiency anemia secondary to blood loss (chronic): Secondary | ICD-10-CM | POA: Diagnosis not present

## 2020-06-15 DIAGNOSIS — D630 Anemia in neoplastic disease: Secondary | ICD-10-CM | POA: Diagnosis not present

## 2020-06-15 DIAGNOSIS — G629 Polyneuropathy, unspecified: Secondary | ICD-10-CM | POA: Diagnosis not present

## 2020-06-15 DIAGNOSIS — Z4801 Encounter for change or removal of surgical wound dressing: Secondary | ICD-10-CM | POA: Diagnosis not present

## 2020-06-15 DIAGNOSIS — M4316 Spondylolisthesis, lumbar region: Secondary | ICD-10-CM | POA: Diagnosis not present

## 2020-06-15 DIAGNOSIS — J910 Malignant pleural effusion: Secondary | ICD-10-CM | POA: Diagnosis not present

## 2020-06-15 DIAGNOSIS — J9811 Atelectasis: Secondary | ICD-10-CM | POA: Diagnosis not present

## 2020-06-15 MED ORDER — BACLOFEN 10 MG PO TABS: 10.0000 mg | ORAL_TABLET | Freq: Three times a day (TID) | ORAL | 1 refills | Status: DC | PRN

## 2020-06-15 MED FILL — BACLOFEN 10 MG TABS: 10 | 30 days supply | Qty: 90 | Fill #0

## 2020-06-19 ENCOUNTER — Other Ambulatory Visit: Payer: Self-pay | Admitting: Thoracic Surgery (Cardiothoracic Vascular Surgery)

## 2020-06-19 DIAGNOSIS — J910 Malignant pleural effusion: Secondary | ICD-10-CM

## 2020-06-20 ENCOUNTER — Ambulatory Visit
Admission: RE | Admit: 2020-06-20 | Discharge: 2020-06-20 | Disposition: A | Discharge status: Home / Self Care | Payer: BC Managed Care – PPO | Source: Ambulatory Visit | Attending: Thoracic Surgery (Cardiothoracic Vascular Surgery) | Admitting: Thoracic Surgery (Cardiothoracic Vascular Surgery)

## 2020-06-20 ENCOUNTER — Encounter: Payer: Self-pay | Admitting: Hematology & Oncology

## 2020-06-20 ENCOUNTER — Other Ambulatory Visit: Payer: Self-pay

## 2020-06-20 ENCOUNTER — Ambulatory Visit (INDEPENDENT_AMBULATORY_CARE_PROVIDER_SITE_OTHER): Payer: Self-pay | Admitting: Thoracic Surgery (Cardiothoracic Vascular Surgery)

## 2020-06-20 VITALS — BP 103/70 | HR 113 | Resp 20 | Ht 69.0 in | Wt 136.0 lb

## 2020-06-20 DIAGNOSIS — Z09 Encounter for follow-up examination after completed treatment for conditions other than malignant neoplasm: Secondary | ICD-10-CM

## 2020-06-20 DIAGNOSIS — Z8679 Personal history of other diseases of the circulatory system: Secondary | ICD-10-CM | POA: Diagnosis not present

## 2020-06-20 DIAGNOSIS — J90 Pleural effusion, not elsewhere classified: Secondary | ICD-10-CM | POA: Diagnosis not present

## 2020-06-20 DIAGNOSIS — J910 Malignant pleural effusion: Secondary | ICD-10-CM

## 2020-06-20 NOTE — Progress Notes (Signed)
Matthew Reynolds       Matthew Reynolds,Matthew Reynolds             [phone]     HPI: Matthew Reynolds returns for a scheduled follow-up visit  Matthew Reynolds is a 55 year old Reynolds with metastatic adenocarcinoma of the GE junction.  He recently developed new bilateral pleural effusions, pericardial effusion, and ascites.  I did a left VATS for a pericardial window pleural biopsy and drainage of pleural and pericardial effusions on 06/05/2020.  He did well at home the next day.  He has some discomfort with drainage of the pleural catheter the first couple of times but has tolerated it well since then.  He had moderate volume initially but it dropped off quickly.  He currently is draining every other day and has about 100 to 128mL each time.  He does complain of some abdominal distention in the right upper quadrant.  Past Medical History:  Diagnosis Date  . 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 Goals of care, counseling/discussion 12/10/2017  . Iron deficiency anemia due to chronic blood loss 12/11/2017  . Malignant neoplasm metastatic to liver ([place]) 12/10/2017  . 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 acetaminophen (TYLENOL) 500 MG tablet Take 500 mg by mouth every 6 (six) hours as needed for mild pain.    . baclofen (LIORESAL) 10 MG tablet TAKE 1 TABLET (10 MG TOTAL) BY MOUTH EVERY 8 (EIGHT) HOURS AS NEEDED FOR MUSCLE SPASMS. 90 tablet 1  . cetirizine (ZYRTEC) 10 MG tablet Take 10 mg by mouth daily as needed for allergies.    . famotidine (PEPCID) 20 MG tablet Take 20 mg by mouth daily as needed for heartburn or indigestion.    . Multiple Vitamin (MULTI-VITAMIN DAILY PO) Take 1 tablet by mouth daily.    Matthew Reynolds Glycol-Propyl Glycol (SYSTANE OP) Place 1 drop into both eyes daily as needed (dry eyes).    Marland Kitchen spironolactone (ALDACTONE) 25 MG tablet TAKE 1  TABLET (25 MG TOTAL) BY MOUTH DAILY. 30 tablet 2  . traMADol (ULTRAM) 50 MG tablet TAKE 1-2 TABLETS (50-100 MG TOTAL) BY MOUTH EVERY 6 (SIX) HOURS AS NEEDED (MILD PAIN). 30 tablet 0   No current facility-administered medications for this visit.   Facility-Administered Medications Ordered in Other Visits  Medication Dose Route Frequency Provider Last Rate Last Admin  . sodium chloride flush (NS) 0.9 % injection 10 mL  10 mL Intravenous PRN [name], MD   10 mL at 03/31/18 1100    Physical Exam BP 103/70 (BP Location: Right Arm, Patient Position: Sitting)   Pulse (!) 113   Resp 20   Ht 5\' 9"  (1.753 m)   Wt 136 lb (61.7 kg)   SpO2 98% Comment: RA  BMI 20.78 kg/m  Matthew Reynolds in no acute distress Alert and oriented x3 with no focal deficits Cachectic Lungs clear bilaterally Cardiac tachycardic prominent PMI Abdomen mild distention  Diagnostic Tests: I reviewed his chest x-ray.  Shows a good result with a normal cardiac shadow and minimal residual left pleural effusion.  Small right pleural effusion unchanged.  Impression: Matthew Reynolds is a 55 year old Reynolds with metastatic adenocarcinoma of the GE junction.  He recently presented with shortness of breath and was found to have pericardial and pleural effusions left greater than right.  He underwent left VATS for pericardial window and pleural biopsy  in addition to draining the effusions on 06/06/2018.  I left a Pleurx catheter in place for management of the left pleural effusion.  Pathology showed metastatic adenocarcinoma.  The tissue has been sent for [facility].  His drainage from the left pleural effusion is tapered off fairly rapidly.  He currently is draining every other day.  In the office today drained about 125 mL.  If he is less than 150 for 3 consecutive drainages he will continue to space out the drainage.  He will go to every third day and then once a week and then once every 2 weeks.  I will be seeing him back in a  couple of weeks so we can go over that again when he returns.  Plan: Return in 2 weeks with PA lateral chest x-ray Follow-up with Dr. Morene Antu, MD [facility] [phone]

## 2020-06-21 ENCOUNTER — Other Ambulatory Visit: Payer: Self-pay

## 2020-06-21 ENCOUNTER — Other Ambulatory Visit: Payer: Self-pay | Admitting: Family

## 2020-06-21 ENCOUNTER — Other Ambulatory Visit (HOSPITAL_BASED_OUTPATIENT_CLINIC_OR_DEPARTMENT_OTHER): Payer: Self-pay

## 2020-06-21 DIAGNOSIS — C160 Malignant neoplasm of cardia: Secondary | ICD-10-CM

## 2020-06-21 DIAGNOSIS — R109 Unspecified abdominal pain: Secondary | ICD-10-CM

## 2020-06-21 DIAGNOSIS — R180 Malignant ascites: Secondary | ICD-10-CM

## 2020-06-21 MED ORDER — TEMAZEPAM 15 MG PO CAPS
15.0000 mg | ORAL_CAPSULE | Freq: Every evening | ORAL | 0 refills | Status: DC | PRN
  Filled 2020-06-21: qty 30, 30d supply, fill #0

## 2020-06-21 MED FILL — Spironolactone Tab 25 MG: ORAL | 30 days supply | Qty: 30 | Fill #0 | Status: AC

## 2020-06-22 ENCOUNTER — Other Ambulatory Visit: Payer: Self-pay

## 2020-06-22 ENCOUNTER — Ambulatory Visit (HOSPITAL_BASED_OUTPATIENT_CLINIC_OR_DEPARTMENT_OTHER)
Admission: RE | Admit: 2020-06-22 | Discharge: 2020-06-22 | Disposition: A | Discharge status: Home / Self Care | Payer: BC Managed Care – PPO | Source: Ambulatory Visit | Attending: Family | Admitting: Family

## 2020-06-22 ENCOUNTER — Other Ambulatory Visit: Payer: Self-pay | Admitting: *Deleted

## 2020-06-22 DIAGNOSIS — R109 Unspecified abdominal pain: Secondary | ICD-10-CM | POA: Diagnosis not present

## 2020-06-22 DIAGNOSIS — C159 Malignant neoplasm of esophagus, unspecified: Secondary | ICD-10-CM | POA: Diagnosis not present

## 2020-06-22 DIAGNOSIS — R180 Malignant ascites: Secondary | ICD-10-CM | POA: Diagnosis not present

## 2020-06-22 DIAGNOSIS — K7689 Other specified diseases of liver: Secondary | ICD-10-CM | POA: Diagnosis not present

## 2020-06-22 DIAGNOSIS — C160 Malignant neoplasm of cardia: Secondary | ICD-10-CM | POA: Insufficient documentation

## 2020-06-22 DIAGNOSIS — K7460 Unspecified cirrhosis of liver: Secondary | ICD-10-CM | POA: Diagnosis not present

## 2020-06-22 DIAGNOSIS — K828 Other specified diseases of gallbladder: Secondary | ICD-10-CM | POA: Diagnosis not present

## 2020-06-22 DIAGNOSIS — Z95828 Presence of other vascular implants and grafts: Secondary | ICD-10-CM

## 2020-06-23 ENCOUNTER — Encounter: Payer: Self-pay | Admitting: Hematology & Oncology

## 2020-06-23 ENCOUNTER — Inpatient Hospital Stay: Payer: BC Managed Care – PPO

## 2020-06-23 ENCOUNTER — Ambulatory Visit (HOSPITAL_COMMUNITY)
Admission: RE | Admit: 2020-06-23 | Discharge: 2020-06-23 | Disposition: A | Discharge status: Home / Self Care | Payer: BC Managed Care – PPO | Source: Ambulatory Visit | Attending: Hematology & Oncology | Admitting: Hematology & Oncology

## 2020-06-23 ENCOUNTER — Other Ambulatory Visit: Payer: Self-pay | Admitting: *Deleted

## 2020-06-23 ENCOUNTER — Inpatient Hospital Stay (HOSPITAL_BASED_OUTPATIENT_CLINIC_OR_DEPARTMENT_OTHER): Payer: BC Managed Care – PPO | Admitting: Hematology & Oncology

## 2020-06-23 ENCOUNTER — Other Ambulatory Visit (HOSPITAL_BASED_OUTPATIENT_CLINIC_OR_DEPARTMENT_OTHER): Payer: Self-pay

## 2020-06-23 ENCOUNTER — Inpatient Hospital Stay: Payer: BC Managed Care – PPO | Attending: Hematology & Oncology

## 2020-06-23 ENCOUNTER — Telehealth: Payer: Self-pay

## 2020-06-23 VITALS — BP 119/84 | HR 106 | Resp 17

## 2020-06-23 VITALS — Temp 98.4°F

## 2020-06-23 VITALS — BP 119/64 | HR 89 | Resp 21 | Wt 135.4 lb

## 2020-06-23 DIAGNOSIS — Z95828 Presence of other vascular implants and grafts: Secondary | ICD-10-CM

## 2020-06-23 DIAGNOSIS — Z5111 Encounter for antineoplastic chemotherapy: Secondary | ICD-10-CM | POA: Insufficient documentation

## 2020-06-23 DIAGNOSIS — Z5112 Encounter for antineoplastic immunotherapy: Secondary | ICD-10-CM | POA: Diagnosis not present

## 2020-06-23 DIAGNOSIS — K7460 Unspecified cirrhosis of liver: Secondary | ICD-10-CM | POA: Diagnosis not present

## 2020-06-23 DIAGNOSIS — E860 Dehydration: Secondary | ICD-10-CM

## 2020-06-23 DIAGNOSIS — J910 Malignant pleural effusion: Secondary | ICD-10-CM | POA: Diagnosis not present

## 2020-06-23 DIAGNOSIS — R188 Other ascites: Secondary | ICD-10-CM | POA: Diagnosis not present

## 2020-06-23 DIAGNOSIS — G893 Neoplasm related pain (acute) (chronic): Secondary | ICD-10-CM

## 2020-06-23 DIAGNOSIS — Z79899 Other long term (current) drug therapy: Secondary | ICD-10-CM | POA: Diagnosis not present

## 2020-06-23 DIAGNOSIS — R180 Malignant ascites: Secondary | ICD-10-CM | POA: Insufficient documentation

## 2020-06-23 DIAGNOSIS — C160 Malignant neoplasm of cardia: Secondary | ICD-10-CM | POA: Diagnosis not present

## 2020-06-23 DIAGNOSIS — D649 Anemia, unspecified: Secondary | ICD-10-CM

## 2020-06-23 HISTORY — PX: IR PARACENTESIS: IMG2679

## 2020-06-23 LAB — CMP (CANCER CENTER ONLY)
ALT: 49 U/L — ABNORMAL HIGH (ref 0–44)
AST: 55 U/L — ABNORMAL HIGH (ref 15–41)
Albumin: 2.8 g/dL — ABNORMAL LOW (ref 3.5–5.0)
Alkaline Phosphatase: 517 U/L — ABNORMAL HIGH (ref 38–126)
Anion gap: 9 (ref 5–15)
BUN: 26 mg/dL — ABNORMAL HIGH (ref 6–20)
CO2: 26 mmol/L (ref 22–32)
Calcium: 9.4 mg/dL (ref 8.9–10.3)
Chloride: 96 mmol/L — ABNORMAL LOW (ref 98–111)
Creatinine: 0.81 mg/dL (ref 0.61–1.24)
GFR, Estimated: >60 mL/min (ref >60)
Glucose, Bld: 110 mg/dL — ABNORMAL HIGH (ref 70–99)
Potassium: 4.8 mmol/L (ref 3.5–5.1)
Sodium: 131 mmol/L — ABNORMAL LOW (ref 135–145)
Total Bilirubin: 0.6 mg/dL (ref 0.3–1.2)
Total Protein: 6.2 g/dL — ABNORMAL LOW (ref 6.5–8.1)

## 2020-06-23 LAB — CBC WITH DIFFERENTIAL (CANCER CENTER ONLY)
Abs Immature Granulocytes: 0.06 10*3/uL (ref 0.00–0.07)
Basophils Absolute: 0.1 10*3/uL (ref 0.0–0.1)
Basophils Relative: 1 %
Eosinophils Absolute: 0 10*3/uL (ref 0.0–0.5)
Eosinophils Relative: 0 %
HCT: 44.1 % (ref 39.0–52.0)
Hemoglobin: 15.2 g/dL (ref 13.0–17.0)
Immature Granulocytes: 1 %
Lymphocytes Relative: 8 %
Lymphs Abs: 0.7 10*3/uL (ref 0.7–4.0)
MCH: 30.2 pg (ref 26.0–34.0)
MCHC: 34.5 g/dL (ref 30.0–36.0)
MCV: 87.5 fL (ref 80.0–100.0)
Monocytes Absolute: 0.8 10*3/uL (ref 0.1–1.0)
Monocytes Relative: 9 %
Neutro Abs: 7.2 10*3/uL (ref 1.7–7.7)
Neutrophils Relative %: 81 %
Platelet Count: 293 10*3/uL (ref 150–400)
RBC: 5.04 MIL/uL (ref 4.22–5.81)
RDW: 13.4 % (ref 11.5–15.5)
WBC Count: 8.8 10*3/uL (ref 4.0–10.5)
nRBC: 0 % (ref 0.0–0.2)

## 2020-06-23 LAB — LACTATE DEHYDROGENASE: LDH: 125 U/L (ref 98–192)

## 2020-06-23 LAB — LACTATE DEHYDROGENASE, PLEURAL OR PERITONEAL FLUID: LD, Fluid: 240 U/L — ABNORMAL HIGH (ref 3–23)

## 2020-06-23 LAB — SAMPLE TO BLOOD BANK

## 2020-06-23 LAB — ALBUMIN, PLEURAL OR PERITONEAL FLUID: Albumin, Fluid: 1.6 g/dL

## 2020-06-23 LAB — PREALBUMIN: Prealbumin: 9.1 mg/dL — ABNORMAL LOW (ref 18–38)

## 2020-06-23 MED ORDER — SODIUM CHLORIDE 0.9 % IV SOLN
INTRAVENOUS | Status: DC
  Filled 2020-06-23: qty 250

## 2020-06-23 MED ORDER — HYDROMORPHONE HCL 1 MG/ML IJ SOLN
2.0000 mg | Freq: Once | INTRAMUSCULAR | Status: AC
  Administered 2020-06-23: 2 mg via INTRAVENOUS

## 2020-06-23 MED ORDER — FENTANYL 12 MCG/HR TD PT72
1.0000 | MEDICATED_PATCH | TRANSDERMAL | 0 refills | Status: DC
  Filled 2020-06-23: qty 10, 30d supply, fill #0

## 2020-06-23 MED ORDER — SODIUM CHLORIDE 0.9% FLUSH
10.0000 mL | Freq: Once | INTRAVENOUS | Status: AC
  Administered 2020-06-23: 10 mL via INTRAVENOUS
  Filled 2020-06-23: qty 10

## 2020-06-23 MED ORDER — LIDOCAINE HCL 1 % IJ SOLN
INTRAMUSCULAR | Status: AC
  Filled 2020-06-23: qty 20

## 2020-06-23 MED ORDER — LIDOCAINE HCL (PF) 1 % IJ SOLN
INTRAMUSCULAR | Status: DC | PRN
  Administered 2020-06-23: 10 mL

## 2020-06-23 MED ORDER — HEPARIN SOD (PORK) LOCK FLUSH 100 UNIT/ML IV SOLN
500.0000 [IU] | Freq: Once | INTRAVENOUS | Status: AC
  Administered 2020-06-23: 500 [IU] via INTRAVENOUS
  Filled 2020-06-23: qty 5

## 2020-06-23 MED ORDER — OXYCODONE HCL 20 MG/ML PO CONC
10.0000 mg | ORAL | 0 refills | Status: DC | PRN
  Filled 2020-06-23: qty 30, 10d supply, fill #0

## 2020-06-23 MED ORDER — HYDROMORPHONE HCL 1 MG/ML IJ SOLN
INTRAMUSCULAR | Status: AC
  Filled 2020-06-23: qty 2

## 2020-06-23 MED ORDER — DRONABINOL 2.5 MG PO CAPS
2.5000 mg | ORAL_CAPSULE | Freq: Two times a day (BID) | ORAL | 0 refills | Status: AC
  Filled 2020-06-23: qty 60, 30d supply, fill #0

## 2020-06-23 NOTE — Procedures (Signed)
PROCEDURE SUMMARY:  Successful US guided paracentesis from right lateral abdomen.  Yielded 5.1 liters of yellow fluid.  No immediate complications.  Pt tolerated well.   Specimen was sent for labs.  EBL < 26mL  Docia Barrier PA-C 06/23/2020 3:55 PM

## 2020-06-23 NOTE — Progress Notes (Signed)
DISCONTINUE ON PATHWAY REGIMEN - Gastroesophageal     Cycles 1 through 8: every 14 days:     Oxaliplatin      Leucovorin      5-Fluorouracil      5-Fluorouracil      Trastuzumab-xxxx      Trastuzumab-xxxx    Cycles 9 and beyond: every 21 days :     Trastuzumab-xxxx   **Always confirm dose/schedule in your pharmacy ordering system**  REASON: Disease Progression PRIOR TREATMENT: GEOS16:  [id number] + Trastuzumab 4 mg/kg q14 Days x 8 Cycles Followed By Trastuzumab Maintenance (6 mg/kg q21 Days) Until Progression or Unacceptable Toxicity TREATMENT RESPONSE: Partial Response (PR)  START ON PATHWAY REGIMEN - Gastroesophageal     A cycle is every 28 days:     Ramucirumab      Paclitaxel   **Always confirm dose/schedule in your pharmacy ordering system**  Patient Characteristics: Distant Metastases (cM1/pM1) / Locally Recurrent Disease, Adenocarcinoma - Esophageal, GE Junction, and Gastric, Second Line, MSS/pMMR or MSI Unknown Histology: Adenocarcinoma Disease Classification: GE Junction Therapeutic Status: Distant Metastases (No Additional Staging) Line of Therapy: Second Line Microsatellite/Mismatch Repair Status: MSS/pMMR Intent of Therapy: Non-Curative / Palliative Intent, Discussed with Patient

## 2020-06-23 NOTE — Telephone Encounter (Signed)
S/w pt and he is aware of his appt on wed 4/13. Per 06/23/20 los  [employer]

## 2020-06-23 NOTE — Patient Instructions (Signed)
Implanted Port Insertion, Care After This sheet gives you information about how to care for yourself after your procedure. Your health care provider may also give you more specific instructions. If you have problems or questions, contact your health care provider. What can I expect after the procedure? After the procedure, it is common to have:  Discomfort at the port insertion site.  Bruising on the skin over the port. This should improve over 3-4 days. Follow these instructions at home: Port care  After your port is placed, you will get a manufacturer's information card. The card has information about your port. Keep this card with you at all times.  Take care of the port as told by your health care provider. Ask your health care provider if you or a family member can get training for taking care of the port at home. A home health care nurse may also take care of the port.  Make sure to remember what type of port you have. Incision care  Follow instructions from your health care provider about how to take care of your port insertion site. Make sure you: ? Wash your hands with soap and water before and after you change your bandage (dressing). If soap and water are not available, use hand sanitizer. ? Change your dressing as told by your health care provider. ? Leave stitches (sutures), skin glue, or adhesive strips in place. These skin closures may need to stay in place for 2 weeks or longer. If adhesive strip edges start to loosen and curl up, you may trim the loose edges. Do not remove adhesive strips completely unless your health care provider tells you to do that.  Check your port insertion site every day for signs of infection. Check for: ? Redness, swelling, or pain. ? Fluid or blood. ? Warmth. ? Pus or a bad smell.      Activity  Return to your normal activities as told by your health care provider. Ask your health care provider what activities are safe for you.  Do not  lift anything that is heavier than 10 lb (4.5 kg), or the limit that you are told, until your health care provider says that it is safe. General instructions  Take over-the-counter and prescription medicines only as told by your health care provider.  Do not take baths, swim, or use a hot tub until your health care provider approves. Ask your health care provider if you may take showers. You may only be allowed to take sponge baths.  Do not drive for 24 hours if you were given a sedative during your procedure.  Wear a medical alert bracelet in case of an emergency. This will tell any health care providers that you have a port.  Keep all follow-up visits as told by your health care provider. This is important. Contact a health care provider if:  You cannot flush your port with saline as directed, or you cannot draw blood from the port.  You have a fever or chills.  You have redness, swelling, or pain around your port insertion site.  You have fluid or blood coming from your port insertion site.  Your port insertion site feels warm to the touch.  You have pus or a bad smell coming from the port insertion site. Get help right away if:  You have chest pain or shortness of breath.  You have bleeding from your port that you cannot control. Summary  Take care of the port as told by your   health care provider. Keep the manufacturer's information card with you at all times.  Change your dressing as told by your health care provider.  Contact a health care provider if you have a fever or chills or if you have redness, swelling, or pain around your port insertion site.  Keep all follow-up visits as told by your health care provider. This information is not intended to replace advice given to you by your health care provider. Make sure you discuss any questions you have with your health care provider. Document Revised: 09/30/2017 Document Reviewed: 09/30/2017 Elsevier Patient Education   2021 [employer]vier Inc.  

## 2020-06-23 NOTE — Progress Notes (Signed)
Hematology and Oncology Follow Up Visit  DADRIAN BALLANTINE [id number] 08-Jan-1966 55 y.o. 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 (+) -- local recurrence on 03/03/2019 --progression with positive pleural effusion on 05/17/2020  Past Therapy: FOLFOX/Herceptin- s/p cycle8 -- d/c on 05/19/2018 Xeloda 2500 mg po BID (14/14) -- s/p cycle 8 Herceptin 6 mg/kg IV q 3 weeks -- maintenance - Start on 12/07/2018 -- d/c on 06/17/2019 CDDP/5-FU + XRT -- s/p cycle 1 -started on 03/08/2019 Keytruda 200 mg IV q 3 week -- cycle 1 - start on 05/31/2019-- d/c on 06/17/2019  Current Therapy: Taxol/Cyramza -- start cycle #1 on 06/28/2020 Enhertu - q 3 week dosing -- s/p cycle#15- started on 06/29/2019 --DC on 05/23/2020   Interim History:  Mr. Appelt is here today for follow-up.  His status is changed markedly since we last saw him.  He clearly is declining.  He is lost probably about 10 pounds or so.  He did undergo a VATS procedure.  This was done on 06/05/2020.  Dr. Roxan Hockey did the VATS.  He drained a pleural effusion.  He did pleural biopsies.  He did a pericardial biopsy.  The pathology report ([id number]) show metastatic adenocarcinoma.  We are still awaiting the molecular markers.  He is gaining more ascites.  He had an ultrasound done yesterday.  This showed some cirrhosis of the liver.  No focal liver lesions were noted.  He had ascites.  We really need to try to get the ascites taken off.  This hopefully will make him feel much more comfortable.  We should be able to get this done today.  As always, radiology is so good to our patients.  Pain is becoming more of a problem.  I totally understand this.  I think that a Duragesic patch would be very useful.  He has has had a hard time swallowing pills.  I think a Duragesic patch at 12 mcg would be a good start.  He will place this every 3 days.  I will also try him on some  OxyFast elixir.  He will take 0.5 cc as needed every few hours.  Maybe, Marinol might be able to help with his appetite and try to gain a little bit of weight.  We are not a situation that he clearly is not curable.  He had been doing so well on anti-HER-2 therapy.  I will be interested to see what the molecular markers show.  I think that standard approach at this point would be Taxol with Cyramza.  I think this would be reasonable.  We will see what the molecular markers show and maybe there might be a targeted therapy that we could utilize.  I just want his quality of life to be better.  This definitely is much more of a problem for him now.  He comes in in a wheelchair.  I would have to say that at best, his performance status is ECOG 2.    Medications:  Allergies as of 06/23/2020   No Known Allergies     Medication List       Accurate as of June 23, 2020 12:30 PM. If you have any questions, ask your nurse or doctor.        STOP taking these medications   traMADol 50 MG tablet Commonly known as: ULTRAM Stopped by: [name], MD     TAKE these medications   acetaminophen 500 MG tablet Commonly known as:  TYLENOL Take 500 mg by mouth every 6 (six) hours as needed for mild pain.   baclofen 10 MG tablet Commonly known as: LIORESAL TAKE 1 TABLET (10 MG TOTAL) BY MOUTH EVERY 8 (EIGHT) HOURS AS NEEDED FOR MUSCLE SPASMS.   cetirizine 10 MG tablet Commonly known as: ZYRTEC Take 10 mg by mouth daily as needed for allergies.   dronabinol 2.5 MG capsule Commonly known as: MARINOL Take 1 capsule (2.5 mg total) by mouth 2 (two) times daily before lunch and supper. Started by: [name], MD   famotidine 20 MG tablet Commonly known as: PEPCID Take 20 mg by mouth daily as needed for heartburn or indigestion.   fentaNYL 12 MCG/HR Commonly known as: [place] 1 patch onto the skin every 3 (three) days. Started by: [name], MD   MULTI-VITAMIN DAILY  PO Take 1 tablet by mouth daily.   oxyCODONE 20 MG/ML concentrated solution Commonly known as: ROXICODONE INTENSOL Take 0.5 mLs (10 mg total) by mouth every 4 (four) hours as needed for severe pain. Started by: [name], MD   spironolactone 25 MG tablet Commonly known as: ALDACTONE TAKE 1 TABLET (25 MG TOTAL) BY MOUTH DAILY.   SYSTANE OP Place 1 drop into both eyes daily as needed (dry eyes).   temazepam 15 MG capsule Commonly known as: RESTORIL Take 1 capsule (15 mg total) by mouth at bedtime as needed for sleep.       Allergies: No Known Allergies  Past Medical History, Surgical history, Social history, and Family History were reviewed and updated.  Review of Systems: Review of Systems  Constitutional: Negative.   HENT: Negative.   Eyes: Negative.   Respiratory: Negative.   Cardiovascular: Negative.   Gastrointestinal: Negative.   Genitourinary: Negative.   Musculoskeletal: Negative.   Skin: Negative.   Neurological: Negative.   Endo/Heme/Allergies: Negative.   Psychiatric/Behavioral: Negative.       Physical Exam:  oral temperature is 98.4 F (36.9 C). His oxygen saturation is 99%.   Wt Readings from Last 3 Encounters:  06/23/20 135 lb 6.4 oz (61.4 kg)  06/20/20 136 lb (61.7 kg)  06/05/20 145 lb (65.8 kg)    Physical Exam Vitals reviewed.  HENT:     Head: Normocephalic and atraumatic.  Eyes:     Pupils: Pupils are equal, round, and reactive to light.  Cardiovascular:     Rate and Rhythm: Normal rate and regular rhythm.     Heart sounds: Normal heart sounds.  Pulmonary:     Effort: Pulmonary effort is normal.     Breath sounds: Normal breath sounds.  Abdominal:     General: Bowel sounds are normal.     Palpations: Abdomen is soft.  Musculoskeletal:        General: No tenderness or deformity. Normal range of motion.     Cervical back: Normal range of motion.  Lymphadenopathy:     Cervical: No cervical adenopathy.  Skin:    General:  Skin is warm and dry.     Findings: No erythema or rash.  Neurological:     Mental Status: He is alert and oriented to person, place, and time.  Psychiatric:        Behavior: Behavior normal.        Thought Content: Thought content normal.        Judgment: Judgment normal.      Lab Results  Component Value Date   WBC 8.8 06/23/2020   HGB 15.2 06/23/2020  HCT 44.1 06/23/2020   MCV 87.5 06/23/2020   PLT 293 06/23/2020   Lab Results  Component Value Date   FERRITIN 222 12/13/2019   IRON 55 12/13/2019   TIBC 289 12/13/2019   UIBC 234 12/13/2019   IRONPCTSAT 19 (L) 12/13/2019   Lab Results  Component Value Date   RBC 5.04 06/23/2020   No results found for: KPAFRELGTCHN, LAMBDASER, KAPLAMBRATIO No results found for: IGGSERUM, IGA, IGMSERUM No results found for: TOTALPROTELP, ALBUMINELP, A1GS, A2GS, BETS, BETA2SER, GAMS, MSPIKE, SPEI   Chemistry      Component Value Date/Time   NA 131 (L) 06/23/2020 1103   K 4.8 06/23/2020 1103   CL 96 (L) 06/23/2020 1103   CO2 26 06/23/2020 1103   BUN 26 (H) 06/23/2020 1103   CREATININE 0.81 06/23/2020 1103      Component Value Date/Time   CALCIUM 9.4 06/23/2020 1103   ALKPHOS 517 (H) 06/23/2020 1103   AST 55 (H) 06/23/2020 1103   ALT 49 (H) 06/23/2020 1103   BILITOT 0.6 06/23/2020 1103       Impression and Plan: Mr. Lafavor is a very pleasant 55yo caucasian gentleman with metastatic adenocarcinoma of the GE junction, HER-2 positive, PD-L1 positive.  Again, he does have progressive disease.  We had been very aggressive with getting biopsies.  Again the molecular markers are definitely necessary.  We will get orders in for the Taxol/Cyramza.  Maybe, we can get started him on this if we find that he does not have any targeted mutations.  Again it would be interesting to see what his HER-2 status is.  I did talk to him a little bit about his situation.  I taught him about his thoughts about being kept alive on machines.  I  explained what all of the of this means.  I told him that he does not have to make a decision right now.  He can certainly talk to his wife about this.  He did not ask me how long I thought he had.  I do think that his weight will show [place] where things are headed.  We are going to have to follow him closely.  I would have to try to get him back in a couple weeks.    [name], MD 4/8/202212:30 PM

## 2020-06-26 ENCOUNTER — Other Ambulatory Visit (HOSPITAL_BASED_OUTPATIENT_CLINIC_OR_DEPARTMENT_OTHER): Payer: Self-pay

## 2020-06-26 DIAGNOSIS — C160 Malignant neoplasm of cardia: Secondary | ICD-10-CM | POA: Diagnosis not present

## 2020-06-26 LAB — IRON AND TIBC
Iron: 19 ug/dL — ABNORMAL LOW (ref 42–163)
Saturation Ratios: 11 % — ABNORMAL LOW (ref 20–55)
TIBC: 179 ug/dL — ABNORMAL LOW (ref 202–409)
UIBC: 159 ug/dL (ref 117–376)

## 2020-06-26 LAB — FERRITIN: Ferritin: 934 ng/mL — ABNORMAL HIGH (ref 24–336)

## 2020-06-26 NOTE — Progress Notes (Signed)
Pharmacist Chemotherapy Monitoring - Initial Assessment    Anticipated start date: 06/28/20  Regimen:  . Are orders appropriate based on the patient's diagnosis, regimen, and cycle? Yes . Does the plan date match the patient's scheduled date? Yes . Is the sequencing of drugs appropriate? Yes . Are the premedications appropriate for the patient's regimen? Yes . Prior Authorization for treatment is: Approved o If applicable, is the correct biosimilar selected based on the patient's insurance? not applicable  Organ Function and Labs: Marland Kitchen Are dose adjustments needed based on the patient's renal function, hepatic function, or hematologic function? No . Are appropriate labs ordered prior to the start of patient's treatment? Yes . Other organ system assessment, if indicated: ramucirumab: baseline BP . The following baseline labs, if indicated, have been ordered: ramucirumab: urine protein  Dose Assessment: . Are the drug doses appropriate? Yes . Are the following correct: o Drug concentrations Yes o IV fluid compatible with drug Yes o Administration routes Yes o Timing of therapy Yes . If applicable, does the patient have documented access for treatment and/or plans for port-a-cath placement? yes . If applicable, have lifetime cumulative doses been properly documented and assessed? not applicable Lifetime Dose Tracking  . Oxaliplatin: 591.287 mg/m2 (1,185 mg) = 98.55 % of the maximum lifetime dose of 600 mg/m2  o   Toxicity Monitoring/Prevention: . The patient has the following take home antiemetics prescribed: Ondansetron, Prochlorperazine and Dexamethasone . The patient has the following take home medications prescribed: N/A . Medication allergies and previous infusion related reactions, if applicable, have been reviewed and addressed. Yes . The patient's current medication list has been assessed for drug-drug interactions with their chemotherapy regimen. no significant drug-drug  interactions were identified on review.  Order Review: . Are the treatment plan orders signed? Yes . Is the patient scheduled to see a provider prior to their treatment? No  I verify that I have reviewed each item in the above checklist and answered each question accordingly.  Claybon Jabs, [place], 06/26/2020  1:00 PM

## 2020-06-27 ENCOUNTER — Encounter (HOSPITAL_COMMUNITY): Payer: Self-pay

## 2020-06-27 ENCOUNTER — Other Ambulatory Visit (HOSPITAL_BASED_OUTPATIENT_CLINIC_OR_DEPARTMENT_OTHER): Payer: Self-pay

## 2020-06-27 ENCOUNTER — Encounter: Payer: Self-pay | Admitting: *Deleted

## 2020-06-27 LAB — CYTOLOGY - NON PAP

## 2020-06-28 ENCOUNTER — Inpatient Hospital Stay: Payer: BC Managed Care – PPO

## 2020-06-28 ENCOUNTER — Other Ambulatory Visit: Payer: Self-pay | Admitting: Hematology & Oncology

## 2020-06-28 ENCOUNTER — Other Ambulatory Visit: Payer: Self-pay

## 2020-06-28 ENCOUNTER — Other Ambulatory Visit (HOSPITAL_BASED_OUTPATIENT_CLINIC_OR_DEPARTMENT_OTHER): Payer: Self-pay

## 2020-06-28 ENCOUNTER — Other Ambulatory Visit: Payer: Self-pay | Admitting: *Deleted

## 2020-06-28 VITALS — BP 94/75 | HR 92 | Resp 17

## 2020-06-28 DIAGNOSIS — Z5112 Encounter for antineoplastic immunotherapy: Secondary | ICD-10-CM | POA: Diagnosis not present

## 2020-06-28 DIAGNOSIS — Z7189 Other specified counseling: Secondary | ICD-10-CM

## 2020-06-28 DIAGNOSIS — R188 Other ascites: Secondary | ICD-10-CM | POA: Diagnosis not present

## 2020-06-28 DIAGNOSIS — D649 Anemia, unspecified: Secondary | ICD-10-CM

## 2020-06-28 DIAGNOSIS — Z79899 Other long term (current) drug therapy: Secondary | ICD-10-CM | POA: Diagnosis not present

## 2020-06-28 DIAGNOSIS — C160 Malignant neoplasm of cardia: Secondary | ICD-10-CM

## 2020-06-28 DIAGNOSIS — Z5111 Encounter for antineoplastic chemotherapy: Secondary | ICD-10-CM | POA: Diagnosis not present

## 2020-06-28 DIAGNOSIS — J910 Malignant pleural effusion: Secondary | ICD-10-CM | POA: Diagnosis not present

## 2020-06-28 DIAGNOSIS — K7460 Unspecified cirrhosis of liver: Secondary | ICD-10-CM | POA: Diagnosis not present

## 2020-06-28 DIAGNOSIS — G893 Neoplasm related pain (acute) (chronic): Secondary | ICD-10-CM | POA: Diagnosis not present

## 2020-06-28 DIAGNOSIS — C799 Secondary malignant neoplasm of unspecified site: Secondary | ICD-10-CM

## 2020-06-28 LAB — CBC WITH DIFFERENTIAL (CANCER CENTER ONLY)
Abs Immature Granulocytes: 0.08 K/uL — ABNORMAL HIGH (ref 0.00–0.07)
Basophils Absolute: 0 K/uL (ref 0.0–0.1)
Basophils Relative: 0 %
Eosinophils Absolute: 0.1 K/uL (ref 0.0–0.5)
Eosinophils Relative: 1 %
HCT: 46.6 % (ref 39.0–52.0)
Hemoglobin: 15.3 g/dL (ref 13.0–17.0)
Immature Granulocytes: 1 %
Lymphocytes Relative: 6 %
Lymphs Abs: 0.8 K/uL (ref 0.7–4.0)
MCH: 28.8 pg (ref 26.0–34.0)
MCHC: 32.8 g/dL (ref 30.0–36.0)
MCV: 87.6 fL (ref 80.0–100.0)
Monocytes Absolute: 0.8 K/uL (ref 0.1–1.0)
Monocytes Relative: 7 %
Neutro Abs: 10.3 K/uL — ABNORMAL HIGH (ref 1.7–7.7)
Neutrophils Relative %: 85 %
Platelet Count: 370 K/uL (ref 150–400)
RBC: 5.32 MIL/uL (ref 4.22–5.81)
RDW: 13.4 % (ref 11.5–15.5)
WBC Count: 12.1 K/uL — ABNORMAL HIGH (ref 4.0–10.5)
nRBC: 0 % (ref 0.0–0.2)

## 2020-06-28 LAB — TYPE AND SCREEN
ABO/RH(D): O POS
Antibody Screen: NEGATIVE

## 2020-06-28 LAB — CMP (CANCER CENTER ONLY)
ALT: 48 U/L — ABNORMAL HIGH (ref 0–44)
AST: 52 U/L — ABNORMAL HIGH (ref 15–41)
Albumin: 2.9 g/dL — ABNORMAL LOW (ref 3.5–5.0)
Alkaline Phosphatase: 488 U/L — ABNORMAL HIGH (ref 38–126)
Anion gap: 7 (ref 5–15)
BUN: 28 mg/dL — ABNORMAL HIGH (ref 6–20)
CO2: 27 mmol/L (ref 22–32)
Calcium: 9.2 mg/dL (ref 8.9–10.3)
Chloride: 94 mmol/L — ABNORMAL LOW (ref 98–111)
Creatinine: 0.93 mg/dL (ref 0.61–1.24)
GFR, Estimated: >60 mL/min
Glucose, Bld: 135 mg/dL — ABNORMAL HIGH (ref 70–99)
Potassium: 5.2 mmol/L — ABNORMAL HIGH (ref 3.5–5.1)
Sodium: 128 mmol/L — ABNORMAL LOW (ref 135–145)
Total Bilirubin: 0.7 mg/dL (ref 0.3–1.2)
Total Protein: 6 g/dL — ABNORMAL LOW (ref 6.5–8.1)

## 2020-06-28 LAB — PREALBUMIN: Prealbumin: 9.3 mg/dL — ABNORMAL LOW (ref 18–38)

## 2020-06-28 LAB — LACTATE DEHYDROGENASE: LDH: 142 U/L (ref 98–192)

## 2020-06-28 MED ORDER — DIPHENHYDRAMINE HCL 50 MG/ML IJ SOLN
INTRAMUSCULAR | Status: AC
  Filled 2020-06-28: qty 1

## 2020-06-28 MED ORDER — DIPHENHYDRAMINE HCL 50 MG/ML IJ SOLN
50.0000 mg | Freq: Once | INTRAMUSCULAR | Status: AC
  Administered 2020-06-28: 50 mg via INTRAVENOUS

## 2020-06-28 MED ORDER — PROCHLORPERAZINE MALEATE 10 MG PO TABS
10.0000 mg | ORAL_TABLET | Freq: Four times a day (QID) | ORAL | 1 refills | Status: DC | PRN
  Filled 2020-06-28: qty 30, 8d supply, fill #0

## 2020-06-28 MED ORDER — SODIUM CHLORIDE 0.9% FLUSH
10.0000 mL | INTRAVENOUS | Status: DC | PRN
  Administered 2020-06-28: 10 mL
  Filled 2020-06-28: qty 10

## 2020-06-28 MED ORDER — SODIUM CHLORIDE 0.9 % IV SOLN
8.0000 mg/kg | Freq: Once | INTRAVENOUS | Status: AC
  Administered 2020-06-28: 500 mg via INTRAVENOUS
  Filled 2020-06-28: qty 50

## 2020-06-28 MED ORDER — LIDOCAINE-PRILOCAINE 2.5-2.5 % EX CREA
TOPICAL_CREAM | CUTANEOUS | 3 refills | Status: DC
  Filled 2020-06-28 – 2020-07-05 (×2): qty 30, 30d supply, fill #0

## 2020-06-28 MED ORDER — ONDANSETRON HCL 8 MG PO TABS
8.0000 mg | ORAL_TABLET | Freq: Two times a day (BID) | ORAL | 1 refills | Status: DC | PRN
  Filled 2020-06-28: qty 30, 15d supply, fill #0

## 2020-06-28 MED ORDER — FAMOTIDINE IN NACL 20-0.9 MG/50ML-% IV SOLN
INTRAVENOUS | Status: AC
  Filled 2020-06-28: qty 50

## 2020-06-28 MED ORDER — SODIUM CHLORIDE 0.9 % IV SOLN
80.0000 mg/m2 | Freq: Once | INTRAVENOUS | Status: AC
  Administered 2020-06-28: 138 mg via INTRAVENOUS
  Filled 2020-06-28: qty 23

## 2020-06-28 MED ORDER — SODIUM CHLORIDE 0.9 % IV SOLN
20.0000 mg | Freq: Once | INTRAVENOUS | Status: AC
  Administered 2020-06-28: 20 mg via INTRAVENOUS
  Filled 2020-06-28: qty 20

## 2020-06-28 MED ORDER — SODIUM CHLORIDE 0.9 % IV SOLN
Freq: Once | INTRAVENOUS | Status: AC
  Filled 2020-06-28: qty 250

## 2020-06-28 MED ORDER — HYDROCORTISONE 10 MG PO TABS
ORAL_TABLET | ORAL | 3 refills | Status: DC
  Filled 2020-06-28: qty 52, 13d supply, fill #0
  Filled 2020-06-28: qty 60, fill #0
  Filled 2020-06-28: qty 68, 17d supply, fill #0

## 2020-06-28 MED ORDER — LORAZEPAM 0.5 MG PO TABS: 0.5000 mg | ORAL_TABLET | Freq: Four times a day (QID) | ORAL | 0 refills | Status: DC | PRN

## 2020-06-28 MED ORDER — FAMOTIDINE IN NACL 20-0.9 MG/50ML-% IV SOLN
20.0000 mg | Freq: Once | INTRAVENOUS | Status: AC
  Administered 2020-06-28: 20 mg via INTRAVENOUS

## 2020-06-28 MED ORDER — HEPARIN SOD (PORK) LOCK FLUSH 100 UNIT/ML IV SOLN
500.0000 [IU] | Freq: Once | INTRAVENOUS | Status: AC | PRN
Start: 2020-06-28 — End: 2020-06-28
  Administered 2020-06-28: 500 [IU]
  Filled 2020-06-28: qty 5

## 2020-06-28 NOTE — Patient Instructions (Signed)

## 2020-06-28 NOTE — Progress Notes (Signed)
Per [name], it's OK to treat with today's CMET results. Treating nurse made aware. Amelia Jo, RN

## 2020-06-28 NOTE — Patient Instructions (Signed)
Paclitaxel injection What is this medicine? PACLITAXEL (PAK li TAX el) is a chemotherapy drug. It targets fast dividing cells, like cancer cells, and causes these cells to die. This medicine is used to treat ovarian cancer, breast cancer, lung cancer, Kaposi's sarcoma, and other cancers. This medicine may be used for other purposes; ask your health care provider or pharmacist if you have questions. COMMON BRAND NAME(S): Onxol, Taxol What should I tell my health care provider before I take this medicine? They need to know if you have any of these conditions:  history of irregular heartbeat  liver disease  low blood counts, like low white cell, platelet, or red cell counts  lung or breathing disease, like asthma  tingling of the fingers or toes, or other nerve disorder  an unusual or allergic reaction to paclitaxel, alcohol, polyoxyethylated castor oil, other chemotherapy, other medicines, foods, dyes, or preservatives  pregnant or trying to get pregnant  breast-feeding How should I use this medicine? This drug is given as an infusion into a vein. It is administered in a hospital or clinic by a specially trained health care professional. Talk to your pediatrician regarding the use of this medicine in children. Special care may be needed. Overdosage: If you think you have taken too much of this medicine contact a poison control center or emergency room at once. NOTE: This medicine is only for you. Do not share this medicine with others. What if I miss a dose? It is important not to miss your dose. Call your doctor or health care professional if you are unable to keep an appointment. What may interact with this medicine? Do not take this medicine with any of the following medications:  live virus vaccines This medicine may also interact with the following medications:  antiviral medicines for hepatitis, HIV or AIDS  certain antibiotics like erythromycin and clarithromycin  certain  medicines for fungal infections like ketoconazole and itraconazole  certain medicines for seizures like carbamazepine, phenobarbital, phenytoin  gemfibrozil  nefazodone  rifampin  St. John's wort This list may not describe all possible interactions. Give your health care provider a list of all the medicines, herbs, non-prescription drugs, or dietary supplements you use. Also tell them if you smoke, drink alcohol, or use illegal drugs. Some items may interact with your medicine. What should I watch for while using this medicine? Your condition will be monitored carefully while you are receiving this medicine. You will need important blood work done while you are taking this medicine. This medicine can cause serious allergic reactions. To reduce your risk you will need to take other medicine(s) before treatment with this medicine. If you experience allergic reactions like skin rash, itching or hives, swelling of the face, lips, or tongue, tell your doctor or health care professional right away. In some cases, you may be given additional medicines to help with side effects. Follow all directions for their use. This drug may make you feel generally unwell. This is not uncommon, as chemotherapy can affect healthy cells as well as cancer cells. Report any side effects. Continue your course of treatment even though you feel ill unless your doctor tells you to stop. Call your doctor or health care professional for advice if you get a fever, chills or sore throat, or other symptoms of a cold or flu. Do not treat yourself. This drug decreases your body's ability to fight infections. Try to avoid being around people who are sick. This medicine may increase your risk to bruise  or bleed. Call your doctor or health care professional if you notice any unusual bleeding. Be careful brushing and flossing your teeth or using a toothpick because you may get an infection or bleed more easily. If you have any dental  work done, tell your dentist you are receiving this medicine. Avoid taking products that contain aspirin, acetaminophen, ibuprofen, naproxen, or ketoprofen unless instructed by your doctor. These medicines may hide a fever. Do not become pregnant while taking this medicine. Women should inform their doctor if they wish to become pregnant or think they might be pregnant. There is a potential for serious side effects to an unborn child. Talk to your health care professional or pharmacist for more information. Do not breast-feed an infant while taking this medicine. Men are advised not to father a child while receiving this medicine. This product may contain alcohol. Ask your pharmacist or healthcare provider if this medicine contains alcohol. Be sure to tell all healthcare providers you are taking this medicine. Certain medicines, like metronidazole and disulfiram, can cause an unpleasant reaction when taken with alcohol. The reaction includes flushing, headache, nausea, vomiting, sweating, and increased thirst. The reaction can last from 30 minutes to several hours. What side effects may I notice from receiving this medicine? Side effects that you should report to your doctor or health care professional as soon as possible:  allergic reactions like skin rash, itching or hives, swelling of the face, lips, or tongue  breathing problems  changes in vision  fast, irregular heartbeat  high or low blood pressure  mouth sores  pain, tingling, numbness in the hands or feet  signs of decreased platelets or bleeding - bruising, pinpoint red spots on the skin, black, tarry stools, blood in the urine  signs of decreased red blood cells - unusually weak or tired, feeling faint or lightheaded, falls  signs of infection - fever or chills, cough, sore throat, pain or difficulty passing urine  signs and symptoms of liver injury like dark yellow or brown urine; general ill feeling or flu-like symptoms;  light-colored stools; loss of appetite; nausea; right upper belly pain; unusually weak or tired; yellowing of the eyes or skin  swelling of the ankles, feet, hands  unusually slow heartbeat Side effects that usually do not require medical attention (report to your doctor or health care professional if they continue or are bothersome):  diarrhea  hair loss  loss of appetite  muscle or joint pain  nausea, vomiting  pain, redness, or irritation at site where injected  tiredness This list may not describe all possible side effects. Call your doctor for medical advice about side effects. You may report side effects to FDA at 1-800-FDA-1088. Where should I keep my medicine? This drug is given in a hospital or clinic and will not be stored at home. NOTE: This sheet is a summary. It may not cover all possible information. If you have questions about this medicine, talk to your doctor, pharmacist, or health care provider.  2021 Elsevier/Gold Standard (2019-02-03 13:37:23) Ramucirumab injection What is this medicine? RAMUCIRUMAB (ra mue SIR ue mab) is a monoclonal antibody. It is used to treat stomach cancer, colorectal cancer, liver cancer, and lung cancer. This medicine may be used for other purposes; ask your health care provider or pharmacist if you have questions. COMMON BRAND NAME(S): Cyramza What should I tell my health care provider before I take this medicine? They need to know if you have any of these conditions:  bleeding disorders  blood clots  heart disease, including heart failure, heart attack, or chest pain (angina)  high blood pressure  infection (especially a virus infection such as chickenpox, cold sores, or herpes)  protein in your urine  recent or planning to have surgery  stroke  an unusual or allergic reaction to ramucirumab, other medicines, foods, dyes, or preservatives  pregnant or trying to get pregnant  breast-feeding How should I use this  medicine? This medicine is for infusion into a vein. It is given by a health care professional in a hospital or clinic setting. Talk to your pediatrician regarding the use of this medicine in children. Special care may be needed. Overdosage: If you think you have taken too much of this medicine contact a poison control center or emergency room at once. NOTE: This medicine is only for you. Do not share this medicine with others. What if I miss a dose? It is important not to miss your dose. Call your doctor or health care professional if you are unable to keep an appointment. What may interact with this medicine? Interactions have not been studied. This list may not describe all possible interactions. Give your health care provider a list of all the medicines, herbs, non-prescription drugs, or dietary supplements you use. Also tell them if you smoke, drink alcohol, or use illegal drugs. Some items may interact with your medicine. What should I watch for while using this medicine? Your condition will be monitored carefully while you are receiving this medicine. You will need to to check your blood pressure and have your blood and urine tested while you are taking this medicine. Your condition will be monitored carefully while you are receiving this medicine. This medicine may increase your risk to bruise or bleed. Call your doctor or health care professional if you notice any unusual bleeding. Before having surgery, talk to your health care provider to make sure it is ok. This drug can increase the risk of poor healing of your surgical site or wound. You will need to stop this drug for 28 days before surgery. After surgery, wait at least 2 weeks before restarting this drug. Make sure the surgical site or wound is healed enough before restarting this drug. Talk to your health care provider if questions. Do not become pregnant while taking this medicine or for 3 months after stopping it. Women should  inform their doctor if they wish to become pregnant or think they might be pregnant. There is a potential for serious side effects to an unborn child. Talk to your health care professional or pharmacist for more information. Do not breast-feed an infant while taking this medicine or for 2 months after stopping it. This medicine may interfere with the ability to have a child. Talk with your doctor or health care professional if you are concerned about your fertility. What side effects may I notice from receiving this medicine? Side effects that you should report to your doctor or health care professional as soon as possible:  allergic reactions like skin rash, itching or hives, breathing problems, swelling of the face, lips, or tongue  signs of infection - fever or chills, cough, sore throat  chest pain or chest tightness  confusion  dizziness  feeling faint or lightheaded, falls  severe abdominal pain  severe nausea, vomiting  signs and symptoms of bleeding such as bloody or black, tarry stools; red or dark-brown urine; spitting up blood or brown material that looks like coffee grounds; red spots on the skin;  unusual bruising or bleeding from the eye, gums, or nose  signs and symptoms of a blood clot such as breathing problems; changes in vision; chest pain; severe, sudden headache; pain, swelling, warmth in the leg; trouble speaking; sudden numbness or weakness of the face, arm or leg  symptoms of a stroke: change in mental awareness, inability to talk or move one side of the body  trouble walking, dizziness, loss of balance or coordination Side effects that usually do not require medical attention (report to your doctor or health care professional if they continue or are bothersome):  cold, clammy skin  constipation  diarrhea  headache  nausea, vomiting  stomach pain  unusually slow heartbeat  unusually weak or tired This list may not describe all possible side effects.  Call your doctor for medical advice about side effects. You may report side effects to FDA at 1-800-FDA-1088. Where should I keep my medicine? This drug is given in a hospital or clinic and will not be stored at home. NOTE: This sheet is a summary. It may not cover all possible information. If you have questions about this medicine, talk to your doctor, pharmacist, or health care provider.  2021 Elsevier/Gold Standard (2018-12-30 11:17:50)

## 2020-06-29 ENCOUNTER — Other Ambulatory Visit (HOSPITAL_BASED_OUTPATIENT_CLINIC_OR_DEPARTMENT_OTHER): Payer: Self-pay

## 2020-07-03 ENCOUNTER — Other Ambulatory Visit: Payer: Self-pay | Admitting: Thoracic Surgery (Cardiothoracic Vascular Surgery)

## 2020-07-03 DIAGNOSIS — C160 Malignant neoplasm of cardia: Secondary | ICD-10-CM

## 2020-07-04 ENCOUNTER — Encounter: Payer: Self-pay | Admitting: Thoracic Surgery (Cardiothoracic Vascular Surgery)

## 2020-07-04 ENCOUNTER — Ambulatory Visit (INDEPENDENT_AMBULATORY_CARE_PROVIDER_SITE_OTHER): Payer: Self-pay | Admitting: Thoracic Surgery (Cardiothoracic Vascular Surgery)

## 2020-07-04 ENCOUNTER — Other Ambulatory Visit: Payer: Self-pay

## 2020-07-04 ENCOUNTER — Telehealth: Payer: Self-pay | Admitting: Nutrition

## 2020-07-04 ENCOUNTER — Ambulatory Visit
Admission: RE | Admit: 2020-07-04 | Discharge: 2020-07-04 | Disposition: A | Discharge status: Home / Self Care | Payer: BC Managed Care – PPO | Source: Ambulatory Visit | Attending: Thoracic Surgery (Cardiothoracic Vascular Surgery) | Admitting: Thoracic Surgery (Cardiothoracic Vascular Surgery)

## 2020-07-04 ENCOUNTER — Other Ambulatory Visit: Payer: Self-pay | Admitting: *Deleted

## 2020-07-04 VITALS — BP 100/72 | HR 110 | Resp 20 | Ht 69.0 in | Wt 125.0 lb

## 2020-07-04 DIAGNOSIS — C160 Malignant neoplasm of cardia: Secondary | ICD-10-CM

## 2020-07-04 DIAGNOSIS — J90 Pleural effusion, not elsewhere classified: Secondary | ICD-10-CM | POA: Diagnosis not present

## 2020-07-04 DIAGNOSIS — J939 Pneumothorax, unspecified: Secondary | ICD-10-CM | POA: Diagnosis not present

## 2020-07-04 DIAGNOSIS — Z09 Encounter for follow-up examination after completed treatment for conditions other than malignant neoplasm: Secondary | ICD-10-CM

## 2020-07-04 DIAGNOSIS — C799 Secondary malignant neoplasm of unspecified site: Secondary | ICD-10-CM

## 2020-07-04 DIAGNOSIS — J9811 Atelectasis: Secondary | ICD-10-CM | POA: Diagnosis not present

## 2020-07-04 DIAGNOSIS — T8182XA Emphysema (subcutaneous) resulting from a procedure, initial encounter: Secondary | ICD-10-CM | POA: Diagnosis not present

## 2020-07-04 DIAGNOSIS — Z7189 Other specified counseling: Secondary | ICD-10-CM

## 2020-07-04 DIAGNOSIS — G893 Neoplasm related pain (acute) (chronic): Secondary | ICD-10-CM

## 2020-07-04 NOTE — Telephone Encounter (Signed)
Telephone follow up completed with patient and wife. Pt continues treatment for cancer of the GE Junction with liver mets. He has disease progression and recently noted to have bilateral pleural effusions. Noted paracentesis on April 8 yielding 5.1 L. Wt documented as 125 pounds today. This is a 20% wt loss from July 2021, significant for time frame.  Patient consuming mostly liquid diet consisting of milk, pudding, yogurt, CIB, Gatorade and milkshakes. He wants to increase variety but still needs liquids.  I educated patient on options for improving variety.  Encouraged blenderizing some foods to increase calories and protein.  Encourage small frequent meals and snacks.  Questions were answered.  I mailed multiple fact sheets with recipes.  Encouraged patient/wife to contact me with further questions.  **Disclaimer: This note was dictated with voice recognition software. Similar sounding words can inadvertently be transcribed and this note may contain transcription errors which may not have been corrected upon publication of note.**

## 2020-07-04 NOTE — Progress Notes (Signed)
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  He developed bilateral pleural effusions, pericardial effusion and ascites.  I did a left VATS for pericardial window, pleural biopsy, and drainage of the effusions on 06/05/2020.  I left a pleural catheter in place on the left.  He did well and went home the next day.  He started draining daily and then every other day.  His drain is dropped off very rapidly.  He currently is draining every third day and is having less than 150 each time, sometimes only a few drops of fluid.  He has noticed air bubbles when there is minimal drainage from the catheter.  He does have some issues with swallowing but nothing has been severe recently and has not had any vomiting.  He has had some occasional cough but no severe coughing spells.  He had about 5 L of ascites drained a week and a half ago.  Past Medical History:  Diagnosis Date  . Adenocarcinoma of cardio-esophageal junction (Matthew Reynolds) 12/10/2017  . Goals of care, counseling/discussion 12/10/2017  . Iron deficiency anemia due to chronic blood loss 12/11/2017  . Malignant neoplasm metastatic to liver (Matthew Reynolds) 12/10/2017  . 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 baclofen (LIORESAL) 10 MG tablet TAKE 1 TABLET (10 MG TOTAL) BY MOUTH EVERY 8 (EIGHT) HOURS AS NEEDED FOR MUSCLE SPASMS. 90 tablet 1  . cetirizine (ZYRTEC) 10 MG tablet Take 10 mg by mouth daily as needed for allergies.    Marland Kitchen dronabinol (MARINOL) 2.5 MG capsule Take 1 capsule (2.5 mg total) by mouth 2 (two) times daily before lunch and supper. 60 capsule 0  . fentaNYL (DURAGESIC) 12 MCG/HR Place 1 patch onto the skin every 3 (three) days. 10 patch 0  .  hydrocortisone (CORTEF) 10 MG tablet take 2 tablets by mouth (this can be crushed) twice a day (Patient taking differently: take 2 tablets by mouth (this can be crushed) twice a day) 120 tablet 3  . lidocaine-prilocaine (EMLA) cream Apply to affected area once 30 g 3  . LORazepam (ATIVAN) 0.5 MG tablet Take 1 tablet (0.5 mg total) by mouth every 6 (six) hours as needed (Nausea or vomiting). 30 tablet 0  . Multiple Vitamin (MULTI-VITAMIN DAILY PO) Take 1 tablet by mouth daily.    . ondansetron (ZOFRAN) 8 MG tablet Take 1 tablet (8 mg total) by mouth 2 (two) times daily as needed (Nausea or vomiting). 30 tablet 1  . oxyCODONE (ROXICODONE INTENSOL) 20 MG/ML concentrated solution Take 0.5 mLs (10 mg total) by mouth every 4 (four) hours as needed for severe pain. 30 mL 0  . Polyethyl Glycol-Propyl Glycol (SYSTANE OP) Place 1 drop into both eyes daily as needed (dry eyes).    . prochlorperazine (COMPAZINE) 10 MG tablet Take 1 tablet (10 mg total) by mouth every 6 (six) hours as needed (Nausea or vomiting). 30 tablet 1  . spironolactone (ALDACTONE) 25 MG tablet TAKE 1 TABLET (25 MG TOTAL) BY MOUTH DAILY. 30 tablet 2  . acetaminophen (TYLENOL) 500 MG tablet Take 500 mg by mouth every 6 (six) hours as needed for mild pain. (  Patient not taking: Reported on 07/04/2020)     No current facility-administered medications for this visit.   Facility-Administered Medications Ordered in Other Visits  Medication Dose Route Frequency Provider Last Rate Last Admin  . sodium chloride flush (NS) 0.9 % injection 10 mL  10 mL Intravenous PRN [name], MD   10 mL at 03/31/18 1100    Physical Exam BP 100/72 (BP Location: Right Arm, Patient Position: Sitting)   Pulse (!) 110   Resp 20   Ht _0  (1.753 m)   Wt 125 lb (56.7 kg)   SpO2 93% Comment: RA  BMI 18.42 kg/m  55 year old man in no acute distress, cachectic Alert and oriented x3 with no focal deficits Cardiac tachycardic and regular Lungs clear  bilaterally Positive subcutaneous emphysema, trachea midline  Diagnostic Tests: I personally reviewed the chest x-ray.  There is a pneumomediastinum and extensive subcutaneous emphysema.  No significant effusion on either side.  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  Pathology from the pericardium and left pleura both showed metastatic adenocarcinoma.  He had a paracentesis with 5 L of fluid drained from his abdomen about 10 days ago.  He has had minimal drainage from his left pleural catheter.  Interestingly today he has a lot of subcutaneous emphysema and some pneumomediastinum.  There is no clear pneumothorax.  He is not in any distress.  I suspect this is due to a pneumo ex vacuo after drainage of the catheter.  There is nothing to suggest esophageal leak.  Possibly had a spontaneous pneumomediastinum but more likely it is related to the pleural catheter the tip of which is in the pericardium.  I do not see any indication for hospital admission or more aggressive work-up at this time.  He was cautioned that if he has any fever, difficulty swallowing out of the ordinary, shortness of breath, or pain that he should seek medical attention immediately.  He has had minimal output from the pleural catheter.  I recommended that he not drain that for 2 weeks.  We will then get another PA and lateral chest x-ray on him and see if there is any recurrence of the effusion.  If not, we can discuss removal of catheter.  Plan: Return in 2 weeks with PA lateral chest x-ray He was instructed to bring a drainage kit with him so that the cath can be drained in the office when he returns.  [name], MD [facility] [phone]

## 2020-07-05 ENCOUNTER — Other Ambulatory Visit (HOSPITAL_BASED_OUTPATIENT_CLINIC_OR_DEPARTMENT_OTHER): Payer: Self-pay

## 2020-07-05 ENCOUNTER — Inpatient Hospital Stay: Payer: BC Managed Care – PPO

## 2020-07-05 ENCOUNTER — Other Ambulatory Visit: Payer: Self-pay

## 2020-07-05 VITALS — BP 105/77 | HR 89 | Temp 94.8°F | Resp 18

## 2020-07-05 DIAGNOSIS — R188 Other ascites: Secondary | ICD-10-CM | POA: Diagnosis not present

## 2020-07-05 DIAGNOSIS — C799 Secondary malignant neoplasm of unspecified site: Secondary | ICD-10-CM

## 2020-07-05 DIAGNOSIS — Z5111 Encounter for antineoplastic chemotherapy: Secondary | ICD-10-CM | POA: Diagnosis not present

## 2020-07-05 DIAGNOSIS — Z5112 Encounter for antineoplastic immunotherapy: Secondary | ICD-10-CM | POA: Diagnosis not present

## 2020-07-05 DIAGNOSIS — G893 Neoplasm related pain (acute) (chronic): Secondary | ICD-10-CM | POA: Diagnosis not present

## 2020-07-05 DIAGNOSIS — Z7189 Other specified counseling: Secondary | ICD-10-CM

## 2020-07-05 DIAGNOSIS — D500 Iron deficiency anemia secondary to blood loss (chronic): Secondary | ICD-10-CM

## 2020-07-05 DIAGNOSIS — C160 Malignant neoplasm of cardia: Secondary | ICD-10-CM

## 2020-07-05 DIAGNOSIS — Z95828 Presence of other vascular implants and grafts: Secondary | ICD-10-CM

## 2020-07-05 DIAGNOSIS — K7460 Unspecified cirrhosis of liver: Secondary | ICD-10-CM | POA: Diagnosis not present

## 2020-07-05 DIAGNOSIS — Z79899 Other long term (current) drug therapy: Secondary | ICD-10-CM | POA: Diagnosis not present

## 2020-07-05 DIAGNOSIS — J910 Malignant pleural effusion: Secondary | ICD-10-CM | POA: Diagnosis not present

## 2020-07-05 LAB — CBC WITH DIFFERENTIAL (CANCER CENTER ONLY)
Abs Immature Granulocytes: 0.03 10*3/uL (ref 0.00–0.07)
Basophils Absolute: 0 10*3/uL (ref 0.0–0.1)
Basophils Relative: 1 %
Eosinophils Absolute: 0.1 10*3/uL (ref 0.0–0.5)
Eosinophils Relative: 3 %
HCT: 45.9 % (ref 39.0–52.0)
Hemoglobin: 15.3 g/dL (ref 13.0–17.0)
Immature Granulocytes: 1 %
Lymphocytes Relative: 14 %
Lymphs Abs: 0.5 10*3/uL — ABNORMAL LOW (ref 0.7–4.0)
MCH: 29.3 pg (ref 26.0–34.0)
MCHC: 33.3 g/dL (ref 30.0–36.0)
MCV: 87.9 fL (ref 80.0–100.0)
Monocytes Absolute: 0.2 10*3/uL (ref 0.1–1.0)
Monocytes Relative: 6 %
Neutro Abs: 2.9 10*3/uL (ref 1.7–7.7)
Neutrophils Relative %: 75 %
Platelet Count: 210 10*3/uL (ref 150–400)
RBC: 5.22 MIL/uL (ref 4.22–5.81)
RDW: 13.6 % (ref 11.5–15.5)
WBC Count: 3.7 10*3/uL — ABNORMAL LOW (ref 4.0–10.5)
nRBC: 0 % (ref 0.0–0.2)

## 2020-07-05 LAB — CMP (CANCER CENTER ONLY)
ALT: 38 U/L (ref 0–44)
AST: 30 U/L (ref 15–41)
Albumin: 2.9 g/dL — ABNORMAL LOW (ref 3.5–5.0)
Alkaline Phosphatase: 303 U/L — ABNORMAL HIGH (ref 38–126)
Anion gap: 7 (ref 5–15)
BUN: 27 mg/dL — ABNORMAL HIGH (ref 6–20)
CO2: 29 mmol/L (ref 22–32)
Calcium: 8.7 mg/dL — ABNORMAL LOW (ref 8.9–10.3)
Chloride: 97 mmol/L — ABNORMAL LOW (ref 98–111)
Creatinine: 0.76 mg/dL (ref 0.61–1.24)
GFR, Estimated: >60 mL/min (ref >60)
Glucose, Bld: 115 mg/dL — ABNORMAL HIGH (ref 70–99)
Potassium: 4.8 mmol/L (ref 3.5–5.1)
Sodium: 133 mmol/L — ABNORMAL LOW (ref 135–145)
Total Bilirubin: 0.8 mg/dL (ref 0.3–1.2)
Total Protein: 5.9 g/dL — ABNORMAL LOW (ref 6.5–8.1)

## 2020-07-05 MED ORDER — SODIUM CHLORIDE 0.9 % IV SOLN
Freq: Once | INTRAVENOUS | Status: AC
  Filled 2020-07-05: qty 250

## 2020-07-05 MED ORDER — TRAMADOL HCL 50 MG PO TABS
50.0000 mg | ORAL_TABLET | Freq: Four times a day (QID) | ORAL | 0 refills | Status: DC | PRN
  Filled 2020-07-05: qty 90, 23d supply, fill #0

## 2020-07-05 MED ORDER — SODIUM CHLORIDE 0.9% FLUSH
10.0000 mL | Freq: Once | INTRAVENOUS | Status: AC
  Administered 2020-07-05: 10 mL via INTRAVENOUS
  Filled 2020-07-05: qty 10

## 2020-07-05 MED ORDER — SODIUM CHLORIDE 0.9 % IV SOLN
80.0000 mg/m2 | Freq: Once | INTRAVENOUS | Status: AC
  Administered 2020-07-05: 138 mg via INTRAVENOUS
  Filled 2020-07-05: qty 23

## 2020-07-05 MED ORDER — SODIUM CHLORIDE 0.9 % IV SOLN
200.0000 mg | Freq: Once | INTRAVENOUS | Status: AC
  Administered 2020-07-05: 200 mg via INTRAVENOUS
  Filled 2020-07-05: qty 200

## 2020-07-05 MED ORDER — HEPARIN SOD (PORK) LOCK FLUSH 100 UNIT/ML IV SOLN
500.0000 [IU] | Freq: Once | INTRAVENOUS | Status: AC | PRN
  Administered 2020-07-05: 500 [IU]
  Filled 2020-07-05: qty 5

## 2020-07-05 MED ORDER — SODIUM CHLORIDE 0.9 % IV SOLN
20.0000 mg | Freq: Once | INTRAVENOUS | Status: AC
  Administered 2020-07-05: 20 mg via INTRAVENOUS
  Filled 2020-07-05: qty 20

## 2020-07-05 MED ORDER — DIPHENHYDRAMINE HCL 50 MG/ML IJ SOLN
INTRAMUSCULAR | Status: AC
  Filled 2020-07-05: qty 1

## 2020-07-05 MED ORDER — DIPHENHYDRAMINE HCL 50 MG/ML IJ SOLN
50.0000 mg | Freq: Once | INTRAMUSCULAR | Status: AC
  Administered 2020-07-05: 50 mg via INTRAVENOUS

## 2020-07-05 MED ORDER — FAMOTIDINE IN NACL 20-0.9 MG/50ML-% IV SOLN
INTRAVENOUS | Status: AC
  Filled 2020-07-05: qty 50

## 2020-07-05 MED ORDER — FAMOTIDINE IN NACL 20-0.9 MG/50ML-% IV SOLN
20.0000 mg | Freq: Once | INTRAVENOUS | Status: AC
  Administered 2020-07-05: 20 mg via INTRAVENOUS

## 2020-07-05 MED ORDER — SODIUM CHLORIDE 0.9% FLUSH
10.0000 mL | INTRAVENOUS | Status: DC | PRN
  Administered 2020-07-05: 10 mL
  Filled 2020-07-05: qty 10

## 2020-07-05 NOTE — Patient Instructions (Signed)
Tunneled Central Venous Catheter Flushing Guide  It is important to flush your tunneled central venous catheter each time you use it, both before and after you use it. Flushing your catheter will help prevent it from clogging. What are the risks? Risks may include:  Infection.  Air getting into the catheter and bloodstream. Supplies needed:  A clean pair of gloves.  A disinfecting wipe. Use an alcohol wipe, chlorhexidine wipe, or iodine wipe as told by your health care provider.  A 10 mL syringe that has been prefilled with saline solution.  An empty 10 mL syringe, if a substance called heparin was injected into your catheter. How to flush your catheter When you flush your catheter, make sure you follow any specific instructions from your health care provider or the manufacturer. These are general guidelines. Flushing your catheter before use If there is heparin in your catheter: 1. Wash your hands with soap and water. 2. Put on gloves. 3. Scrub the injection cap for a minimum of 15 seconds with a disinfecting wipe. 4. Unclamp the catheter. 5. Attach the empty syringe to the injection cap. 6. Pull the syringe plunger back and withdraw 10 mL of blood. 7. Place the syringe into an appropriate waste container. 8. Scrub the injection cap for 15 seconds with a disinfecting wipe. 9. Attach the prefilled syringe to the injection cap. 10. Flush the catheter by pushing the plunger forward until all the liquid from the syringe is in the catheter. 11. Remove the syringe from the injection cap. 12. Clamp the catheter. If there is no heparin in your catheter: 1. Wash your hands with soap and water. 2. Put on gloves. 3. Scrub the injection cap for 15 seconds with a disinfecting wipe. 4. Unclamp the catheter. 5. Attach the prefilled syringe to the injection cap. 6. Flush the catheter by pushing the plunger forward until 5 mL of the liquid from the syringe is in the catheter. 7. Pull back on  the syringe until you see blood in the catheter. 8. If you have been asked to collect any blood, follow your health care provider's instructions. Otherwise, flush the catheter with the rest of the solution from the syringe. 9. Remove the syringe from the injection cap. 10. Clamp the catheter.   Flushing your catheter after use 1. Wash your hands with soap and water. 2. Put on gloves. 3. Scrub the injection cap for 15 seconds with a disinfecting wipe. 4. Unclamp the catheter. 5. Attach the prefilled syringe to the injection cap. 6. Flush the catheter by pushing the plunger forward until all of the liquid from the syringe is in the catheter. 7. Remove the syringe from the injection cap. 8. Clamp the catheter. Problems and solutions  If blood cannot be completely cleared from the injection cap, you may need to have the injection cap replaced.  If the catheter is difficult to flush, use the pulsing method. The pulsing method involves pushing only a few milliliters of solution into the catheter at a time and pausing between pushes.  If you do not see blood in the catheter when you pull back on the syringe, change your body position, such as by raising your arms above your head. Take a deep breath and cough. Then, pull back on the syringe. If you still do not see blood, flush the catheter with a small amount of solution. Then, change positions again and take a breath or cough. Pull back on the syringe again. If you still do not   see blood, finish flushing the catheter and contact your health care provider. Do not use your catheter until your health care provider says it is okay. General tips  Have someone help you flush your catheter, if possible.  Do not force fluid through your catheter.  Do not use a syringe that is larger or smaller than 10 mL. Using a smaller syringe can make the catheter burst.  Do not use your catheter without flushing it first if it has heparin in it. Contact a health  care provider if:  You cannot see any blood in the catheter when you flush it before using it.  Your catheter is difficult to flush. Get help right away if:  You cannot flush the catheter.  The catheter leaks when you flush it or when there is fluid in it.  There are cracks or breaks in the catheter. Summary  It is important to flush your tunneled central venous catheter each time you use it, both before and after you use it.  Scrub the injection cap for 15 seconds with a disinfecting wipe before and after you flush it.  When you flush your catheter, make sure you follow any specific instructions from your health care provider or the manufacturer.  Get help right away if you cannot flush the catheter. This information is not intended to replace advice given to you by your health care provider. Make sure you discuss any questions you have with your health care provider. Document Revised: 05/13/2019 Document Reviewed: 05/20/2018 Elsevier Patient Education  2021 Elsevier Inc.  

## 2020-07-12 ENCOUNTER — Other Ambulatory Visit: Payer: Self-pay | Admitting: *Deleted

## 2020-07-12 DIAGNOSIS — Z95828 Presence of other vascular implants and grafts: Secondary | ICD-10-CM

## 2020-07-12 DIAGNOSIS — Z7189 Other specified counseling: Secondary | ICD-10-CM

## 2020-07-12 DIAGNOSIS — C799 Secondary malignant neoplasm of unspecified site: Secondary | ICD-10-CM

## 2020-07-12 DIAGNOSIS — C160 Malignant neoplasm of cardia: Secondary | ICD-10-CM

## 2020-07-13 ENCOUNTER — Inpatient Hospital Stay: Payer: BC Managed Care – PPO

## 2020-07-13 ENCOUNTER — Other Ambulatory Visit (HOSPITAL_BASED_OUTPATIENT_CLINIC_OR_DEPARTMENT_OTHER): Payer: Self-pay

## 2020-07-13 ENCOUNTER — Encounter: Payer: Self-pay | Admitting: Hematology & Oncology

## 2020-07-13 ENCOUNTER — Inpatient Hospital Stay (HOSPITAL_BASED_OUTPATIENT_CLINIC_OR_DEPARTMENT_OTHER): Payer: BC Managed Care – PPO | Admitting: Hematology & Oncology

## 2020-07-13 ENCOUNTER — Telehealth: Payer: Self-pay

## 2020-07-13 ENCOUNTER — Other Ambulatory Visit: Payer: Self-pay

## 2020-07-13 VITALS — BP 112/76 | HR 82 | Temp 98.2°F | Resp 24 | Wt 123.0 lb

## 2020-07-13 DIAGNOSIS — G893 Neoplasm related pain (acute) (chronic): Secondary | ICD-10-CM | POA: Diagnosis not present

## 2020-07-13 DIAGNOSIS — Z79899 Other long term (current) drug therapy: Secondary | ICD-10-CM | POA: Diagnosis not present

## 2020-07-13 DIAGNOSIS — Z7189 Other specified counseling: Secondary | ICD-10-CM

## 2020-07-13 DIAGNOSIS — J910 Malignant pleural effusion: Secondary | ICD-10-CM | POA: Diagnosis not present

## 2020-07-13 DIAGNOSIS — Z95828 Presence of other vascular implants and grafts: Secondary | ICD-10-CM

## 2020-07-13 DIAGNOSIS — C160 Malignant neoplasm of cardia: Secondary | ICD-10-CM

## 2020-07-13 DIAGNOSIS — C799 Secondary malignant neoplasm of unspecified site: Secondary | ICD-10-CM

## 2020-07-13 DIAGNOSIS — K7460 Unspecified cirrhosis of liver: Secondary | ICD-10-CM | POA: Diagnosis not present

## 2020-07-13 DIAGNOSIS — Z5112 Encounter for antineoplastic immunotherapy: Secondary | ICD-10-CM | POA: Diagnosis not present

## 2020-07-13 DIAGNOSIS — R188 Other ascites: Secondary | ICD-10-CM | POA: Diagnosis not present

## 2020-07-13 DIAGNOSIS — Z5111 Encounter for antineoplastic chemotherapy: Secondary | ICD-10-CM | POA: Diagnosis not present

## 2020-07-13 LAB — CBC WITH DIFFERENTIAL (CANCER CENTER ONLY)
Abs Immature Granulocytes: 0.22 10*3/uL — ABNORMAL HIGH (ref 0.00–0.07)
Basophils Absolute: 0.1 10*3/uL (ref 0.0–0.1)
Basophils Relative: 2 %
Eosinophils Absolute: 0 10*3/uL (ref 0.0–0.5)
Eosinophils Relative: 0 %
HCT: 40.6 % (ref 39.0–52.0)
Hemoglobin: 13.4 g/dL (ref 13.0–17.0)
Immature Granulocytes: 7 %
Lymphocytes Relative: 15 %
Lymphs Abs: 0.5 10*3/uL — ABNORMAL LOW (ref 0.7–4.0)
MCH: 28.9 pg (ref 26.0–34.0)
MCHC: 33 g/dL (ref 30.0–36.0)
MCV: 87.5 fL (ref 80.0–100.0)
Monocytes Absolute: 0.8 10*3/uL (ref 0.1–1.0)
Monocytes Relative: 26 %
Neutro Abs: 1.6 10*3/uL — ABNORMAL LOW (ref 1.7–7.7)
Neutrophils Relative %: 50 %
Platelet Count: 132 10*3/uL — ABNORMAL LOW (ref 150–400)
RBC: 4.64 MIL/uL (ref 4.22–5.81)
RDW: 14.1 % (ref 11.5–15.5)
WBC Count: 3.2 10*3/uL — ABNORMAL LOW (ref 4.0–10.5)
nRBC: 0 % (ref 0.0–0.2)

## 2020-07-13 LAB — CMP (CANCER CENTER ONLY)
ALT: 37 U/L (ref 0–44)
AST: 32 U/L (ref 15–41)
Albumin: 2.7 g/dL — ABNORMAL LOW (ref 3.5–5.0)
Alkaline Phosphatase: 226 U/L — ABNORMAL HIGH (ref 38–126)
Anion gap: 5 (ref 5–15)
BUN: 28 mg/dL — ABNORMAL HIGH (ref 6–20)
CO2: 31 mmol/L (ref 22–32)
Calcium: 8.7 mg/dL — ABNORMAL LOW (ref 8.9–10.3)
Chloride: 98 mmol/L (ref 98–111)
Creatinine: 0.68 mg/dL (ref 0.61–1.24)
GFR, Estimated: >60 mL/min (ref >60)
Glucose, Bld: 135 mg/dL — ABNORMAL HIGH (ref 70–99)
Potassium: 4.8 mmol/L (ref 3.5–5.1)
Sodium: 134 mmol/L — ABNORMAL LOW (ref 135–145)
Total Bilirubin: 0.8 mg/dL (ref 0.3–1.2)
Total Protein: 5.5 g/dL — ABNORMAL LOW (ref 6.5–8.1)

## 2020-07-13 LAB — PREALBUMIN: Prealbumin: 5.4 mg/dL — ABNORMAL LOW (ref 18–38)

## 2020-07-13 LAB — LACTATE DEHYDROGENASE: LDH: 122 U/L (ref 98–192)

## 2020-07-13 MED ORDER — DIPHENHYDRAMINE HCL 50 MG/ML IJ SOLN
INTRAMUSCULAR | Status: AC
  Filled 2020-07-13: qty 1

## 2020-07-13 MED ORDER — HEPARIN SOD (PORK) LOCK FLUSH 100 UNIT/ML IV SOLN
500.0000 [IU] | Freq: Once | INTRAVENOUS | Status: AC | PRN
  Administered 2020-07-13: 500 [IU]
  Filled 2020-07-13: qty 5

## 2020-07-13 MED ORDER — FAMOTIDINE IN NACL 20-0.9 MG/50ML-% IV SOLN
INTRAVENOUS | Status: AC
  Filled 2020-07-13: qty 50

## 2020-07-13 MED ORDER — SODIUM CHLORIDE 0.9% FLUSH
10.0000 mL | INTRAVENOUS | Status: DC | PRN
  Administered 2020-07-13: 10 mL
  Filled 2020-07-13: qty 10

## 2020-07-13 MED ORDER — SODIUM CHLORIDE 0.9 % IV SOLN
Freq: Once | INTRAVENOUS | Status: AC
Start: 2020-07-13 — End: 2020-07-13
  Filled 2020-07-13: qty 250

## 2020-07-13 MED ORDER — FAMOTIDINE IN NACL 20-0.9 MG/50ML-% IV SOLN
20.0000 mg | Freq: Once | INTRAVENOUS | Status: AC
  Administered 2020-07-13: 20 mg via INTRAVENOUS

## 2020-07-13 MED ORDER — FENTANYL 25 MCG/HR TD PT72
1.0000 | MEDICATED_PATCH | TRANSDERMAL | 0 refills | Status: AC
  Filled 2020-07-13: qty 10, 30d supply, fill #0

## 2020-07-13 MED ORDER — SODIUM CHLORIDE 0.9 % IV SOLN
80.0000 mg/m2 | Freq: Once | INTRAVENOUS | Status: AC
  Administered 2020-07-13: 138 mg via INTRAVENOUS
  Filled 2020-07-13: qty 23

## 2020-07-13 MED ORDER — SODIUM CHLORIDE 0.9% FLUSH
10.0000 mL | Freq: Once | INTRAVENOUS | Status: AC
Start: 2020-07-13 — End: 2020-07-13
  Administered 2020-07-13: 10 mL via INTRAVENOUS
  Filled 2020-07-13: qty 10

## 2020-07-13 MED ORDER — SODIUM CHLORIDE 0.9 % IV SOLN
400.0000 mg | Freq: Once | INTRAVENOUS | Status: AC
  Administered 2020-07-13: 400 mg via INTRAVENOUS
  Filled 2020-07-13: qty 40

## 2020-07-13 MED ORDER — SODIUM CHLORIDE 0.9 % IV SOLN
20.0000 mg | Freq: Once | INTRAVENOUS | Status: AC
  Administered 2020-07-13: 20 mg via INTRAVENOUS
  Filled 2020-07-13: qty 20

## 2020-07-13 MED ORDER — DIPHENHYDRAMINE HCL 50 MG/ML IJ SOLN
50.0000 mg | Freq: Once | INTRAMUSCULAR | Status: AC
  Administered 2020-07-13: 50 mg via INTRAVENOUS

## 2020-07-13 MED FILL — Spironolactone Tab 25 MG: ORAL | 30 days supply | Qty: 30 | Fill #1 | Status: CN

## 2020-07-13 NOTE — Progress Notes (Signed)
Hematology and Oncology Follow Up Visit  Matthew Reynolds [id number] 1965-06-13 55 y.o. 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 (+) -- local recurrence on 03/03/2019 --progression with positive pleural effusion on 05/17/2020  Past Therapy: FOLFOX/Herceptin- s/p cycle8 -- d/c on 05/19/2018 Xeloda 2500 mg po BID (14/14) -- s/p cycle 8 Herceptin 6 mg/kg IV q 3 weeks -- maintenance - Start on 12/07/2018 -- d/c on 06/17/2019 CDDP/5-FU + XRT -- s/p cycle 1 -started on 03/08/2019 Keytruda 200 mg IV q 3 week -- cycle 1 - start on 05/31/2019-- d/c on 06/17/2019  Current Therapy: Taxol/Cyramza -- start cycle #1 on 06/28/2020 Enhertu - q 3 week dosing -- s/p cycle#15- started on 06/29/2019 --DC on 05/23/2020   Interim History:  Matthew Reynolds is here today for follow-up.  The real problem that we have is his weight is down quite a bit.  His weight is down 12 pounds since we last saw him a couple weeks ago.  I really just hate this.  He is trying to eat better.  He is eating small amounts.  He says he can really only taken liquids.  I am not sure exactly why that would be.  It is hard to say if there is any obvious dysphagia.  Hopefully, we do not have to do an upper endoscopy on him.  I know he is trying hard.  He has an indwelling peritoneal catheter for ascites.  He really does not have to use this but weekly.  This hopefully is a good sign for [place].  He has had no bleeding.  I think he is tolerated chemotherapy pretty well.  We did do molecular markers on his cancer recurrence.  There really is no change.  His tumor is still HER2 positive.  There has been no problems with bowels or bladder.  He has had no diarrhea.  There is been no issues with pain.  He is on a fentanyl patch.  I think he does have oxycodone on occasion.  I think he does take tramadol on occasion.  Overall, his performance status is ECOG 2.     Medications:   Allergies as of 07/13/2020   No Known Allergies     Medication List       Accurate as of July 13, 2020 11:08 AM. If you have any questions, ask your nurse or doctor.        acetaminophen 500 MG tablet Commonly known as: TYLENOL Take 500 mg by mouth every 6 (six) hours as needed for mild pain.   baclofen 10 MG tablet Commonly known as: LIORESAL TAKE 1 TABLET (10 MG TOTAL) BY MOUTH EVERY 8 (EIGHT) HOURS AS NEEDED FOR MUSCLE SPASMS.   cetirizine 10 MG tablet Commonly known as: ZYRTEC Take 10 mg by mouth daily as needed for allergies.   dronabinol 2.5 MG capsule Commonly known as: MARINOL Take 1 capsule (2.5 mg total) by mouth 2 (two) times daily before lunch and supper.   fentaNYL 12 MCG/HR Commonly known as: [place] 1 patch onto the skin every 3 (three) days.   hydrocortisone 10 MG tablet Commonly known as: CORTEF take 2 tablets by mouth (this can be crushed) twice a day   lidocaine-prilocaine cream Commonly known as: EMLA Apply to affected area once   LORazepam 0.5 MG tablet Commonly known as: Ativan Take 1 tablet (0.5 mg total) by mouth every 6 (six) hours as needed (Nausea or vomiting).   MULTI-VITAMIN DAILY  PO Take 1 tablet by mouth daily.   ondansetron 8 MG tablet Commonly known as: Zofran Take 1 tablet (8 mg total) by mouth 2 (two) times daily as needed (Nausea or vomiting).   oxyCODONE 20 MG/ML concentrated solution Commonly known as: ROXICODONE INTENSOL Take 0.5 mLs (10 mg total) by mouth every 4 (four) hours as needed for severe pain.   prochlorperazine 10 MG tablet Commonly known as: COMPAZINE Take 1 tablet (10 mg total) by mouth every 6 (six) hours as needed (Nausea or vomiting).   spironolactone 25 MG tablet Commonly known as: ALDACTONE TAKE 1 TABLET (25 MG TOTAL) BY MOUTH DAILY.   SYSTANE OP Place 1 drop into both eyes daily as needed (dry eyes).   traMADol 50 MG tablet Commonly known as: ULTRAM Take 1 tablet (50 mg total) by  mouth every 6 (six) hours as needed.       Allergies: No Known Allergies  Past Medical History, Surgical history, Social history, and Family History were reviewed and updated.  Review of Systems: Review of Systems  Constitutional: Negative.   HENT: Negative.   Eyes: Negative.   Respiratory: Negative.   Cardiovascular: Negative.   Gastrointestinal: Negative.   Genitourinary: Negative.   Musculoskeletal: Negative.   Skin: Negative.   Neurological: Negative.   Endo/Heme/Allergies: Negative.   Psychiatric/Behavioral: Negative.       Physical Exam:  weight is 123 lb (55.8 kg). His oral temperature is 98.2 F (36.8 C). His blood pressure is 112/76 and his pulse is 82. His respiration is 24 (abnormal) and oxygen saturation is 99%.   Wt Readings from Last 3 Encounters:  07/13/20 123 lb (55.8 kg)  07/04/20 125 lb (56.7 kg)  06/23/20 135 lb 6.4 oz (61.4 kg)    Physical Exam Vitals reviewed.  HENT:     Head: Normocephalic and atraumatic.  Eyes:     Pupils: Pupils are equal, round, and reactive to light.  Cardiovascular:     Rate and Rhythm: Normal rate and regular rhythm.     Heart sounds: Normal heart sounds.  Pulmonary:     Effort: Pulmonary effort is normal.     Breath sounds: Normal breath sounds.  Abdominal:     General: Bowel sounds are normal.     Palpations: Abdomen is soft.  Musculoskeletal:        General: No tenderness or deformity. Normal range of motion.     Cervical back: Normal range of motion.  Lymphadenopathy:     Cervical: No cervical adenopathy.  Skin:    General: Skin is warm and dry.     Findings: No erythema or rash.  Neurological:     Mental Status: He is alert and oriented to person, place, and time.  Psychiatric:        Behavior: Behavior normal.        Thought Content: Thought content normal.        Judgment: Judgment normal.      Lab Results  Component Value Date   WBC 3.2 (L) 07/13/2020   HGB 13.4 07/13/2020   HCT 40.6  07/13/2020   MCV 87.5 07/13/2020   PLT 132 (L) 07/13/2020   Lab Results  Component Value Date   FERRITIN 934 (H) 06/23/2020   IRON 19 (L) 06/23/2020   TIBC 179 (L) 06/23/2020   UIBC 159 06/23/2020   IRONPCTSAT 11 (L) 06/23/2020   Lab Results  Component Value Date   RBC 4.64 07/13/2020   No results found for: KPAFRELGTCHN, LAMBDASER,  KAPLAMBRATIO No results found for: IGGSERUM, IGA, IGMSERUM No results found for: TOTALPROTELP, ALBUMINELP, A1GS, A2GS, BETS, BETA2SER, GAMS, MSPIKE, SPEI   Chemistry      Component Value Date/Time   NA 134 (L) 07/13/2020 1001   K 4.8 07/13/2020 1001   CL 98 07/13/2020 1001   CO2 31 07/13/2020 1001   BUN 28 (H) 07/13/2020 1001   CREATININE 0.68 07/13/2020 1001      Component Value Date/Time   CALCIUM 8.7 (L) 07/13/2020 1001   ALKPHOS 226 (H) 07/13/2020 1001   AST 32 07/13/2020 1001   ALT 37 07/13/2020 1001   BILITOT 0.8 07/13/2020 1001       Impression and Plan: Mr. Biggar is a very pleasant 55yo caucasian gentleman with metastatic adenocarcinoma of the GE junction, HER-2 positive, PD-L1 positive.  He has had a recurrence.  Again, the weight is really troublesome.  We are going to have to monitor this.  If his weight continues to decline, that they were going to have to think about switching over to a more palliative approach.  I know that he is incredibly motivated.  I know his wife is doing a fantastic job with him.  I told him to stay with the Marinol.  He is only taking a couple times.  He thought it did not help.  However, I told him that it takes 3 to 4 days before that really does help.  We will plan for his treatment today.  We will then get him back in 2 weeks for his next cycle.   [name], MD 4/28/202211:08 AM

## 2020-07-13 NOTE — Patient Instructions (Addendum)
[facility]  Discharge Instructions: Thank you for choosing [facility] to provide your oncology and hematology care.   If you have a lab appointment with the [facility], please go directly to the [facility] and check in at the registration area.  Wear comfortable clothing and clothing appropriate for easy access to any Portacath or PICC line.   We strive to give you quality time with your provider. You may need to reschedule your appointment if you arrive late (15 or more minutes).  Arriving late affects you and other patients whose appointments are after yours.  Also, if you miss three or more appointments without notifying the office, you may be dismissed from the clinic at the provider's discretion.      For prescription refill requests, have your pharmacy contact our office and allow 72 hours for refills to be completed.    Today you received the following chemotherapy and/or immunotherapy agents Cyramza and Taxol      To help prevent nausea and vomiting after your treatment, we encourage you to take your nausea medication as directed.  BELOW ARE SYMPTOMS THAT SHOULD BE REPORTED IMMEDIATELY: . *FEVER GREATER THAN 100.4 F (38 C) OR HIGHER . *CHILLS OR SWEATING . *NAUSEA AND VOMITING THAT IS NOT CONTROLLED WITH YOUR NAUSEA MEDICATION . *UNUSUAL SHORTNESS OF BREATH . *UNUSUAL BRUISING OR BLEEDING . *URINARY PROBLEMS (pain or burning when urinating, or frequent urination) . *BOWEL PROBLEMS (unusual diarrhea, constipation, pain near the anus) . TENDERNESS IN MOUTH AND THROAT WITH OR WITHOUT PRESENCE OF ULCERS (sore throat, sores in mouth, or a toothache) . UNUSUAL RASH, SWELLING OR PAIN  . UNUSUAL VAGINAL DISCHARGE OR ITCHING   Items with * indicate a potential emergency and should be followed up as soon as possible or go to the Emergency Department if any problems should occur.  Please show the CHEMOTHERAPY ALERT CARD or IMMUNOTHERAPY  ALERT CARD at check-in to the Emergency Department and triage nurse. Should you have questions after your visit or need to cancel or reschedule your appointment, please contact [facility]  [phone] and follow the prompts.  Office hours are 8:00 a.m. to 4:30 p.m. Monday - Friday. Please note that voicemails left after 4:00 p.m. may not be returned until the following business day.  We are closed weekends and major holidays. You have access to a nurse at all times for urgent questions. Please call the main number to the clinic [phone] and follow the prompts.  For any non-urgent questions, you may also contact your provider using MyChart. We now offer e-Visits for anyone 50 and older to request care online for non-urgent symptoms. For details visit [email or web address].   Also download the MyChart app! Go to the app store, search "MyChart", open the app, select [facility], and log in with your MyChart username and password.  Due to Covid, a mask is required upon entering the hospital/clinic. If you do not have a mask, one will be given to you upon arrival. For doctor visits, patients may have 1 support person aged 77 or older with them. For treatment visits, patients cannot have anyone with them due to current Covid guidelines and our immunocompromised population.

## 2020-07-13 NOTE — Patient Instructions (Signed)
Tunneled Central Venous Catheter Flushing Guide  It is important to flush your tunneled central venous catheter each time you use it, both before and after you use it. Flushing your catheter will help prevent it from clogging. What are the risks? Risks may include:  Infection.  Air getting into the catheter and bloodstream. Supplies needed:  A clean pair of gloves.  A disinfecting wipe. Use an alcohol wipe, chlorhexidine wipe, or iodine wipe as told by your health care provider.  A 10 mL syringe that has been prefilled with saline solution.  An empty 10 mL syringe, if a substance called heparin was injected into your catheter. How to flush your catheter When you flush your catheter, make sure you follow any specific instructions from your health care provider or the manufacturer. These are general guidelines. Flushing your catheter before use If there is heparin in your catheter: 1. Wash your hands with soap and water. 2. Put on gloves. 3. Scrub the injection cap for a minimum of 15 seconds with a disinfecting wipe. 4. Unclamp the catheter. 5. Attach the empty syringe to the injection cap. 6. Pull the syringe plunger back and withdraw 10 mL of blood. 7. Place the syringe into an appropriate waste container. 8. Scrub the injection cap for 15 seconds with a disinfecting wipe. 9. Attach the prefilled syringe to the injection cap. 10. Flush the catheter by pushing the plunger forward until all the liquid from the syringe is in the catheter. 11. Remove the syringe from the injection cap. 12. Clamp the catheter. If there is no heparin in your catheter: 1. Wash your hands with soap and water. 2. Put on gloves. 3. Scrub the injection cap for 15 seconds with a disinfecting wipe. 4. Unclamp the catheter. 5. Attach the prefilled syringe to the injection cap. 6. Flush the catheter by pushing the plunger forward until 5 mL of the liquid from the syringe is in the catheter. 7. Pull back on  the syringe until you see blood in the catheter. 8. If you have been asked to collect any blood, follow your health care provider's instructions. Otherwise, flush the catheter with the rest of the solution from the syringe. 9. Remove the syringe from the injection cap. 10. Clamp the catheter.   Flushing your catheter after use 1. Wash your hands with soap and water. 2. Put on gloves. 3. Scrub the injection cap for 15 seconds with a disinfecting wipe. 4. Unclamp the catheter. 5. Attach the prefilled syringe to the injection cap. 6. Flush the catheter by pushing the plunger forward until all of the liquid from the syringe is in the catheter. 7. Remove the syringe from the injection cap. 8. Clamp the catheter. Problems and solutions  If blood cannot be completely cleared from the injection cap, you may need to have the injection cap replaced.  If the catheter is difficult to flush, use the pulsing method. The pulsing method involves pushing only a few milliliters of solution into the catheter at a time and pausing between pushes.  If you do not see blood in the catheter when you pull back on the syringe, change your body position, such as by raising your arms above your head. Take a deep breath and cough. Then, pull back on the syringe. If you still do not see blood, flush the catheter with a small amount of solution. Then, change positions again and take a breath or cough. Pull back on the syringe again. If you still do not   see blood, finish flushing the catheter and contact your health care provider. Do not use your catheter until your health care provider says it is okay. General tips  Have someone help you flush your catheter, if possible.  Do not force fluid through your catheter.  Do not use a syringe that is larger or smaller than 10 mL. Using a smaller syringe can make the catheter burst.  Do not use your catheter without flushing it first if it has heparin in it. Contact a health  care provider if:  You cannot see any blood in the catheter when you flush it before using it.  Your catheter is difficult to flush. Get help right away if:  You cannot flush the catheter.  The catheter leaks when you flush it or when there is fluid in it.  There are cracks or breaks in the catheter. Summary  It is important to flush your tunneled central venous catheter each time you use it, both before and after you use it.  Scrub the injection cap for 15 seconds with a disinfecting wipe before and after you flush it.  When you flush your catheter, make sure you follow any specific instructions from your health care provider or the manufacturer.  Get help right away if you cannot flush the catheter. This information is not intended to replace advice given to you by your health care provider. Make sure you discuss any questions you have with your health care provider. Document Revised: 05/13/2019 Document Reviewed: 05/20/2018 Elsevier Patient Education  2021 Elsevier Inc.  

## 2020-07-13 NOTE — Addendum Note (Signed)
Addended by: Burney Gauze R on: 07/13/2020 03:05 PM   Modules accepted: Orders

## 2020-07-13 NOTE — Telephone Encounter (Signed)
appts made per 07/13/20 los and pt will gain sch in tx/avs and through my chart   Matthew Reynolds

## 2020-07-17 ENCOUNTER — Inpatient Hospital Stay (HOSPITAL_BASED_OUTPATIENT_CLINIC_OR_DEPARTMENT_OTHER)
Admission: EM | Admit: 2020-07-17 | Discharge: 2020-07-20 | DRG: 871 | Disposition: A | Discharge status: Hospice | Payer: BC Managed Care – PPO | Attending: Internal Medicine | Admitting: Internal Medicine

## 2020-07-17 ENCOUNTER — Other Ambulatory Visit (HOSPITAL_BASED_OUTPATIENT_CLINIC_OR_DEPARTMENT_OTHER): Payer: Self-pay

## 2020-07-17 ENCOUNTER — Other Ambulatory Visit: Payer: Self-pay

## 2020-07-17 ENCOUNTER — Encounter (HOSPITAL_BASED_OUTPATIENT_CLINIC_OR_DEPARTMENT_OTHER): Payer: Self-pay | Admitting: Emergency Medicine

## 2020-07-17 ENCOUNTER — Emergency Department (HOSPITAL_BASED_OUTPATIENT_CLINIC_OR_DEPARTMENT_OTHER): Payer: BC Managed Care – PPO

## 2020-07-17 DIAGNOSIS — Z95828 Presence of other vascular implants and grafts: Secondary | ICD-10-CM

## 2020-07-17 DIAGNOSIS — R627 Adult failure to thrive: Secondary | ICD-10-CM | POA: Diagnosis present

## 2020-07-17 DIAGNOSIS — Z8501 Personal history of malignant neoplasm of esophagus: Secondary | ICD-10-CM

## 2020-07-17 DIAGNOSIS — R066 Hiccough: Secondary | ICD-10-CM | POA: Diagnosis present

## 2020-07-17 DIAGNOSIS — Z681 Body mass index (BMI) 19 or less, adult: Secondary | ICD-10-CM | POA: Diagnosis not present

## 2020-07-17 DIAGNOSIS — E46 Unspecified protein-calorie malnutrition: Secondary | ICD-10-CM | POA: Diagnosis present

## 2020-07-17 DIAGNOSIS — T80219A Unspecified infection due to central venous catheter, initial encounter: Secondary | ICD-10-CM

## 2020-07-17 DIAGNOSIS — D6959 Other secondary thrombocytopenia: Secondary | ICD-10-CM | POA: Diagnosis present

## 2020-07-17 DIAGNOSIS — A419 Sepsis, unspecified organism: Secondary | ICD-10-CM | POA: Diagnosis not present

## 2020-07-17 DIAGNOSIS — T797XXS Traumatic subcutaneous emphysema, sequela: Secondary | ICD-10-CM

## 2020-07-17 DIAGNOSIS — C160 Malignant neoplasm of cardia: Secondary | ICD-10-CM | POA: Diagnosis present

## 2020-07-17 DIAGNOSIS — J984 Other disorders of lung: Secondary | ICD-10-CM | POA: Diagnosis not present

## 2020-07-17 DIAGNOSIS — J910 Malignant pleural effusion: Secondary | ICD-10-CM

## 2020-07-17 DIAGNOSIS — R059 Cough, unspecified: Secondary | ICD-10-CM | POA: Diagnosis not present

## 2020-07-17 DIAGNOSIS — J189 Pneumonia, unspecified organism: Secondary | ICD-10-CM

## 2020-07-17 DIAGNOSIS — Z20822 Contact with and (suspected) exposure to covid-19: Secondary | ICD-10-CM | POA: Diagnosis present

## 2020-07-17 DIAGNOSIS — C787 Secondary malignant neoplasm of liver and intrahepatic bile duct: Secondary | ICD-10-CM | POA: Diagnosis not present

## 2020-07-17 DIAGNOSIS — R188 Other ascites: Secondary | ICD-10-CM | POA: Diagnosis present

## 2020-07-17 DIAGNOSIS — Z85028 Personal history of other malignant neoplasm of stomach: Secondary | ICD-10-CM

## 2020-07-17 DIAGNOSIS — I2690 Septic pulmonary embolism without acute cor pulmonale: Secondary | ICD-10-CM | POA: Diagnosis not present

## 2020-07-17 DIAGNOSIS — J90 Pleural effusion, not elsewhere classified: Secondary | ICD-10-CM | POA: Diagnosis not present

## 2020-07-17 DIAGNOSIS — Z79899 Other long term (current) drug therapy: Secondary | ICD-10-CM | POA: Diagnosis not present

## 2020-07-17 DIAGNOSIS — C799 Secondary malignant neoplasm of unspecified site: Secondary | ICD-10-CM | POA: Diagnosis present

## 2020-07-17 DIAGNOSIS — R1310 Dysphagia, unspecified: Secondary | ICD-10-CM

## 2020-07-17 DIAGNOSIS — Z515 Encounter for palliative care: Secondary | ICD-10-CM

## 2020-07-17 DIAGNOSIS — Z66 Do not resuscitate: Secondary | ICD-10-CM | POA: Diagnosis not present

## 2020-07-17 DIAGNOSIS — T730XXA Starvation, initial encounter: Secondary | ICD-10-CM | POA: Diagnosis present

## 2020-07-17 DIAGNOSIS — J188 Other pneumonia, unspecified organism: Secondary | ICD-10-CM | POA: Diagnosis not present

## 2020-07-17 DIAGNOSIS — E440 Moderate protein-calorie malnutrition: Secondary | ICD-10-CM | POA: Diagnosis present

## 2020-07-17 DIAGNOSIS — T451X5A Adverse effect of antineoplastic and immunosuppressive drugs, initial encounter: Secondary | ICD-10-CM | POA: Diagnosis not present

## 2020-07-17 DIAGNOSIS — K7460 Unspecified cirrhosis of liver: Secondary | ICD-10-CM | POA: Diagnosis present

## 2020-07-17 DIAGNOSIS — Z923 Personal history of irradiation: Secondary | ICD-10-CM | POA: Diagnosis not present

## 2020-07-17 DIAGNOSIS — J982 Interstitial emphysema: Secondary | ICD-10-CM | POA: Diagnosis not present

## 2020-07-17 DIAGNOSIS — R0602 Shortness of breath: Secondary | ICD-10-CM | POA: Diagnosis not present

## 2020-07-17 DIAGNOSIS — Z7189 Other specified counseling: Secondary | ICD-10-CM | POA: Diagnosis not present

## 2020-07-17 DIAGNOSIS — R6520 Severe sepsis without septic shock: Secondary | ICD-10-CM

## 2020-07-17 DIAGNOSIS — D701 Agranulocytosis secondary to cancer chemotherapy: Secondary | ICD-10-CM | POA: Diagnosis not present

## 2020-07-17 DIAGNOSIS — C3490 Malignant neoplasm of unspecified part of unspecified bronchus or lung: Secondary | ICD-10-CM | POA: Diagnosis not present

## 2020-07-17 DIAGNOSIS — C159 Malignant neoplasm of esophagus, unspecified: Secondary | ICD-10-CM | POA: Diagnosis not present

## 2020-07-17 DIAGNOSIS — I76 Septic arterial embolism: Secondary | ICD-10-CM | POA: Diagnosis present

## 2020-07-17 DIAGNOSIS — J439 Emphysema, unspecified: Secondary | ICD-10-CM | POA: Diagnosis not present

## 2020-07-17 DIAGNOSIS — Z09 Encounter for follow-up examination after completed treatment for conditions other than malignant neoplasm: Secondary | ICD-10-CM

## 2020-07-17 DIAGNOSIS — D708 Other neutropenia: Secondary | ICD-10-CM | POA: Diagnosis not present

## 2020-07-17 MED ORDER — HYDROMORPHONE HCL 1 MG/ML IJ SOLN
1.0000 mg | Freq: Once | INTRAMUSCULAR | Status: AC
  Administered 2020-07-18: 1 mg via INTRAVENOUS
  Filled 2020-07-17: qty 1

## 2020-07-17 MED ORDER — ONDANSETRON HCL 4 MG/2ML IJ SOLN
4.0000 mg | Freq: Once | INTRAMUSCULAR | Status: AC
  Administered 2020-07-18: 4 mg via INTRAVENOUS
  Filled 2020-07-17: qty 2

## 2020-07-17 MED FILL — Spironolactone Tab 25 MG: ORAL | 30 days supply | Qty: 30 | Fill #1 | Status: CN

## 2020-07-17 NOTE — ED Triage Notes (Signed)
Pt c/o productive cough and shob starting today. Pt is CA pt currently receiving chemo. Last chemo tx 4/28

## 2020-07-17 NOTE — ED Notes (Signed)
ED Provider at bedside. 

## 2020-07-17 NOTE — ED Notes (Signed)
Patient transported to X-ray 

## 2020-07-18 ENCOUNTER — Emergency Department (HOSPITAL_BASED_OUTPATIENT_CLINIC_OR_DEPARTMENT_OTHER): Payer: BC Managed Care – PPO

## 2020-07-18 ENCOUNTER — Encounter (HOSPITAL_COMMUNITY): Payer: Self-pay | Admitting: Internal Medicine

## 2020-07-18 ENCOUNTER — Other Ambulatory Visit: Payer: Self-pay

## 2020-07-18 ENCOUNTER — Other Ambulatory Visit (HOSPITAL_BASED_OUTPATIENT_CLINIC_OR_DEPARTMENT_OTHER): Payer: Self-pay

## 2020-07-18 DIAGNOSIS — J910 Malignant pleural effusion: Secondary | ICD-10-CM | POA: Diagnosis present

## 2020-07-18 DIAGNOSIS — R1310 Dysphagia, unspecified: Secondary | ICD-10-CM | POA: Diagnosis not present

## 2020-07-18 DIAGNOSIS — E440 Moderate protein-calorie malnutrition: Secondary | ICD-10-CM | POA: Diagnosis present

## 2020-07-18 DIAGNOSIS — Z20822 Contact with and (suspected) exposure to covid-19: Secondary | ICD-10-CM | POA: Diagnosis present

## 2020-07-18 DIAGNOSIS — R188 Other ascites: Secondary | ICD-10-CM | POA: Diagnosis present

## 2020-07-18 DIAGNOSIS — D708 Other neutropenia: Secondary | ICD-10-CM | POA: Diagnosis not present

## 2020-07-18 DIAGNOSIS — R627 Adult failure to thrive: Secondary | ICD-10-CM | POA: Diagnosis present

## 2020-07-18 DIAGNOSIS — D6959 Other secondary thrombocytopenia: Secondary | ICD-10-CM | POA: Diagnosis present

## 2020-07-18 DIAGNOSIS — K7460 Unspecified cirrhosis of liver: Secondary | ICD-10-CM | POA: Diagnosis present

## 2020-07-18 DIAGNOSIS — J189 Pneumonia, unspecified organism: Secondary | ICD-10-CM | POA: Diagnosis not present

## 2020-07-18 DIAGNOSIS — Z79899 Other long term (current) drug therapy: Secondary | ICD-10-CM | POA: Diagnosis not present

## 2020-07-18 DIAGNOSIS — J188 Other pneumonia, unspecified organism: Secondary | ICD-10-CM | POA: Diagnosis present

## 2020-07-18 DIAGNOSIS — D701 Agranulocytosis secondary to cancer chemotherapy: Secondary | ICD-10-CM | POA: Diagnosis present

## 2020-07-18 DIAGNOSIS — I2690 Septic pulmonary embolism without acute cor pulmonale: Secondary | ICD-10-CM | POA: Diagnosis not present

## 2020-07-18 DIAGNOSIS — Z8501 Personal history of malignant neoplasm of esophagus: Secondary | ICD-10-CM | POA: Diagnosis not present

## 2020-07-18 DIAGNOSIS — Z85028 Personal history of other malignant neoplasm of stomach: Secondary | ICD-10-CM | POA: Diagnosis not present

## 2020-07-18 DIAGNOSIS — Z95828 Presence of other vascular implants and grafts: Secondary | ICD-10-CM | POA: Diagnosis not present

## 2020-07-18 DIAGNOSIS — C787 Secondary malignant neoplasm of liver and intrahepatic bile duct: Secondary | ICD-10-CM | POA: Diagnosis present

## 2020-07-18 DIAGNOSIS — C3490 Malignant neoplasm of unspecified part of unspecified bronchus or lung: Secondary | ICD-10-CM | POA: Diagnosis present

## 2020-07-18 DIAGNOSIS — C160 Malignant neoplasm of cardia: Secondary | ICD-10-CM | POA: Diagnosis present

## 2020-07-18 DIAGNOSIS — Z681 Body mass index (BMI) 19 or less, adult: Secondary | ICD-10-CM | POA: Diagnosis not present

## 2020-07-18 DIAGNOSIS — R066 Hiccough: Secondary | ICD-10-CM | POA: Diagnosis present

## 2020-07-18 DIAGNOSIS — J984 Other disorders of lung: Secondary | ICD-10-CM | POA: Diagnosis not present

## 2020-07-18 DIAGNOSIS — C799 Secondary malignant neoplasm of unspecified site: Secondary | ICD-10-CM | POA: Diagnosis present

## 2020-07-18 DIAGNOSIS — Z66 Do not resuscitate: Secondary | ICD-10-CM | POA: Diagnosis present

## 2020-07-18 DIAGNOSIS — Z515 Encounter for palliative care: Secondary | ICD-10-CM | POA: Diagnosis not present

## 2020-07-18 DIAGNOSIS — Z7189 Other specified counseling: Secondary | ICD-10-CM | POA: Diagnosis not present

## 2020-07-18 DIAGNOSIS — C159 Malignant neoplasm of esophagus, unspecified: Secondary | ICD-10-CM | POA: Diagnosis not present

## 2020-07-18 DIAGNOSIS — Z923 Personal history of irradiation: Secondary | ICD-10-CM | POA: Diagnosis not present

## 2020-07-18 DIAGNOSIS — T797XXS Traumatic subcutaneous emphysema, sequela: Secondary | ICD-10-CM | POA: Diagnosis not present

## 2020-07-18 DIAGNOSIS — A419 Sepsis, unspecified organism: Secondary | ICD-10-CM | POA: Diagnosis present

## 2020-07-18 DIAGNOSIS — T451X5A Adverse effect of antineoplastic and immunosuppressive drugs, initial encounter: Secondary | ICD-10-CM | POA: Diagnosis present

## 2020-07-18 DIAGNOSIS — E46 Unspecified protein-calorie malnutrition: Secondary | ICD-10-CM | POA: Diagnosis present

## 2020-07-18 DIAGNOSIS — R0602 Shortness of breath: Secondary | ICD-10-CM | POA: Diagnosis not present

## 2020-07-18 DIAGNOSIS — I76 Septic arterial embolism: Secondary | ICD-10-CM | POA: Diagnosis present

## 2020-07-18 LAB — COMPREHENSIVE METABOLIC PANEL
ALT: 30 U/L (ref 0–44)
AST: 35 U/L (ref 15–41)
Albumin: 2.1 g/dL — ABNORMAL LOW (ref 3.5–5.0)
Alkaline Phosphatase: 189 U/L — ABNORMAL HIGH (ref 38–126)
Anion gap: 9 (ref 5–15)
BUN: 28 mg/dL — ABNORMAL HIGH (ref 6–20)
CO2: 27 mmol/L (ref 22–32)
Calcium: 8.2 mg/dL — ABNORMAL LOW (ref 8.9–10.3)
Chloride: 102 mmol/L (ref 98–111)
Creatinine, Ser: 0.65 mg/dL (ref 0.61–1.24)
GFR, Estimated: >60 mL/min (ref >60)
Glucose, Bld: 112 mg/dL — ABNORMAL HIGH (ref 70–99)
Potassium: 5.1 mmol/L (ref 3.5–5.1)
Sodium: 138 mmol/L (ref 135–145)
Total Bilirubin: 2.1 mg/dL — ABNORMAL HIGH (ref 0.3–1.2)
Total Protein: 5.3 g/dL — ABNORMAL LOW (ref 6.5–8.1)

## 2020-07-18 LAB — CBC WITH DIFFERENTIAL/PLATELET
Abs Immature Granulocytes: 0.02 10*3/uL (ref 0.00–0.07)
Basophils Absolute: 0.1 10*3/uL (ref 0.0–0.1)
Basophils Relative: 5 %
Eosinophils Absolute: 0 10*3/uL (ref 0.0–0.5)
Eosinophils Relative: 0 %
HCT: 43.5 % (ref 39.0–52.0)
Hemoglobin: 14.2 g/dL (ref 13.0–17.0)
Immature Granulocytes: 2 %
Lymphocytes Relative: 21 %
Lymphs Abs: 0.2 10*3/uL — ABNORMAL LOW (ref 0.7–4.0)
MCH: 28.9 pg (ref 26.0–34.0)
MCHC: 32.6 g/dL (ref 30.0–36.0)
MCV: 88.4 fL (ref 80.0–100.0)
Monocytes Absolute: 0.1 10*3/uL (ref 0.1–1.0)
Monocytes Relative: 6 %
Neutro Abs: 0.7 10*3/uL — ABNORMAL LOW (ref 1.7–7.7)
Neutrophils Relative %: 66 %
Platelets: 79 10*3/uL — ABNORMAL LOW (ref 150–400)
RBC: 4.92 MIL/uL (ref 4.22–5.81)
RDW: 14.6 % (ref 11.5–15.5)
WBC: 1 10*3/uL — CL (ref 4.0–10.5)
nRBC: 0 % (ref 0.0–0.2)

## 2020-07-18 LAB — TROPONIN I (HIGH SENSITIVITY)
Troponin I (High Sensitivity): 11 ng/L (ref <18)
Troponin I (High Sensitivity): 11 ng/L (ref <18)

## 2020-07-18 LAB — LACTIC ACID, PLASMA: Lactic Acid, Venous: 1.2 mmol/L (ref 0.5–1.9)

## 2020-07-18 LAB — RESP PANEL BY RT-PCR (FLU A&B, COVID) ARPGX2
Influenza A by PCR: NEGATIVE
Influenza B by PCR: NEGATIVE
SARS Coronavirus 2 by RT PCR: NEGATIVE

## 2020-07-18 LAB — MRSA PCR SCREENING: MRSA by PCR: NEGATIVE

## 2020-07-18 MED ORDER — SODIUM CHLORIDE 0.9% FLUSH
3.0000 mL | Freq: Two times a day (BID) | INTRAVENOUS | Status: DC
  Administered 2020-07-18: 3 mL via INTRAVENOUS

## 2020-07-18 MED ORDER — VANCOMYCIN HCL IN DEXTROSE 1-5 GM/200ML-% IV SOLN
1000.0000 mg | Freq: Once | INTRAVENOUS | Status: AC
  Administered 2020-07-18: 1000 mg via INTRAVENOUS
  Filled 2020-07-18: qty 200

## 2020-07-18 MED ORDER — SODIUM CHLORIDE 0.9 % IV BOLUS
1000.0000 mL | Freq: Once | INTRAVENOUS | Status: AC
  Administered 2020-07-18: 1000 mL via INTRAVENOUS

## 2020-07-18 MED ORDER — LACTATED RINGERS IV SOLN: INTRAVENOUS | Status: DC

## 2020-07-18 MED ORDER — SODIUM CHLORIDE 0.9 % IV SOLN
2.0000 g | Freq: Three times a day (TID) | INTRAVENOUS | Status: DC
  Administered 2020-07-18 – 2020-07-20 (×8): 2 g via INTRAVENOUS
  Filled 2020-07-18 (×8): qty 2

## 2020-07-18 MED ORDER — FENTANYL 25 MCG/HR TD PT72
1.0000 | MEDICATED_PATCH | TRANSDERMAL | Status: DC
  Administered 2020-07-19: 1 via TRANSDERMAL
  Filled 2020-07-18: qty 1

## 2020-07-18 MED ORDER — BACLOFEN 10 MG PO TABS: 10.0000 mg | ORAL_TABLET | Freq: Three times a day (TID) | ORAL | Status: DC | PRN

## 2020-07-18 MED ORDER — IOHEXOL 300 MG/ML  SOLN
80.0000 mL | Freq: Once | INTRAMUSCULAR | Status: AC | PRN
  Administered 2020-07-18: 80 mL via INTRAVENOUS

## 2020-07-18 MED ORDER — POLYETHYLENE GLYCOL 3350 17 G PO PACK: 17.0000 g | PACK | Freq: Every day | ORAL | Status: DC | PRN

## 2020-07-18 MED ORDER — TBO-FILGRASTIM 300 MCG/0.5ML ~~LOC~~ SOSY
300.0000 ug | PREFILLED_SYRINGE | Freq: Every day | SUBCUTANEOUS | Status: DC
  Administered 2020-07-18 – 2020-07-19 (×2): 300 ug via SUBCUTANEOUS
  Filled 2020-07-18 (×2): qty 0.5

## 2020-07-18 MED ORDER — LIDOCAINE-PRILOCAINE 2.5-2.5 % EX CREA
1.0000 "application " | TOPICAL_CREAM | Freq: Once | CUTANEOUS | Status: DC
  Filled 2020-07-18: qty 5

## 2020-07-18 MED ORDER — SODIUM CHLORIDE 0.9% FLUSH: 10.0000 mL | INTRAVENOUS | Status: DC | PRN

## 2020-07-18 MED ORDER — HYDRALAZINE HCL 20 MG/ML IJ SOLN: 5.0000 mg | INTRAMUSCULAR | Status: DC | PRN

## 2020-07-18 MED ORDER — GUAIFENESIN ER 600 MG PO TB12: 600.0000 mg | ORAL_TABLET | Freq: Two times a day (BID) | ORAL | Status: DC | PRN

## 2020-07-18 MED ORDER — SODIUM CHLORIDE 0.9 % IV SOLN: INTRAVENOUS | Status: DC

## 2020-07-18 MED ORDER — HYDROMORPHONE HCL 1 MG/ML IJ SOLN
1.0000 mg | INTRAMUSCULAR | Status: AC | PRN
  Administered 2020-07-18 (×3): 1 mg via INTRAVENOUS
  Filled 2020-07-18 (×3): qty 1

## 2020-07-18 MED ORDER — ALBUTEROL SULFATE (2.5 MG/3ML) 0.083% IN NEBU: 2.5000 mg | INHALATION_SOLUTION | RESPIRATORY_TRACT | Status: DC | PRN

## 2020-07-18 MED ORDER — DRONABINOL 2.5 MG PO CAPS
2.5000 mg | ORAL_CAPSULE | Freq: Two times a day (BID) | ORAL | Status: DC
  Filled 2020-07-18: qty 1

## 2020-07-18 MED ORDER — CHLORHEXIDINE GLUCONATE CLOTH 2 % EX PADS
6.0000 | MEDICATED_PAD | Freq: Every day | CUTANEOUS | Status: DC
Start: 2020-07-18 — End: 2020-07-20
  Administered 2020-07-18 – 2020-07-19 (×2): 6 via TOPICAL

## 2020-07-18 MED ORDER — DOCUSATE SODIUM 100 MG PO CAPS
100.0000 mg | ORAL_CAPSULE | Freq: Two times a day (BID) | ORAL | Status: DC
  Filled 2020-07-18 (×4): qty 1

## 2020-07-18 MED ORDER — ONDANSETRON HCL 4 MG/2ML IJ SOLN: 4.0000 mg | Freq: Four times a day (QID) | INTRAMUSCULAR | Status: DC | PRN

## 2020-07-18 MED ORDER — ENOXAPARIN SODIUM 40 MG/0.4ML IJ SOSY
40.0000 mg | PREFILLED_SYRINGE | INTRAMUSCULAR | Status: DC
  Administered 2020-07-18 – 2020-07-19 (×2): 40 mg via SUBCUTANEOUS
  Filled 2020-07-18 (×3): qty 0.4

## 2020-07-18 MED ORDER — TRAMADOL HCL 50 MG PO TABS
50.0000 mg | ORAL_TABLET | Freq: Four times a day (QID) | ORAL | Status: DC | PRN
  Administered 2020-07-18: 50 mg via ORAL
  Filled 2020-07-18 (×2): qty 1

## 2020-07-18 MED ORDER — ONDANSETRON HCL 4 MG PO TABS: 4.0000 mg | ORAL_TABLET | Freq: Four times a day (QID) | ORAL | Status: DC | PRN

## 2020-07-18 MED ORDER — ACETAMINOPHEN 325 MG PO TABS: 650.0000 mg | ORAL_TABLET | Freq: Four times a day (QID) | ORAL | Status: DC | PRN

## 2020-07-18 MED ORDER — BISACODYL 5 MG PO TBEC: 5.0000 mg | DELAYED_RELEASE_TABLET | Freq: Every day | ORAL | Status: DC | PRN

## 2020-07-18 MED ORDER — HYDROMORPHONE HCL 1 MG/ML IJ SOLN
1.0000 mg | Freq: Once | INTRAMUSCULAR | Status: AC
  Administered 2020-07-18: 1 mg via INTRAVENOUS
  Filled 2020-07-18: qty 1

## 2020-07-18 MED ORDER — ACETAMINOPHEN 650 MG RE SUPP: 650.0000 mg | Freq: Four times a day (QID) | RECTAL | Status: DC | PRN

## 2020-07-18 MED ORDER — SPIRONOLACTONE 25 MG PO TABS: 25.0000 mg | ORAL_TABLET | Freq: Every day | ORAL | Status: DC

## 2020-07-18 MED ORDER — SODIUM CHLORIDE 0.9% FLUSH
10.0000 mL | Freq: Two times a day (BID) | INTRAVENOUS | Status: DC
  Administered 2020-07-19: 20 mL
  Administered 2020-07-20: 10 mL

## 2020-07-18 NOTE — H&P (Signed)
History and Physical    VALENTINO SAAVEDRA FTD:[id number] DOB: Jul 27, 1965 DOA: 07/17/2020  PCP: Cari Caraway, MD Consultants:  Marin Olp - oncology; Roxan Hockey - CT surgery Patient coming from: Home - lives with wife; NOK: Wife, Caren Griffins [phone]  Chief Complaint: Cough, SOB  HPI: Matthew Reynolds is a 55 y.o. male with medical history significant of GE junction adenocarcinoma with liver mets presenting with cough and SOB.  He has esophageal cancer, diagnosed in 11/2017.  Went through chemo.  Localized recurrent in 02/2019 and had radiation therapy.  I admitted him on 3/1 after a routine scan showed  a small pericardial effusion with moderate B pleural effusions and some ascites.  He ended up being diagnosed with decompensated liver cirrhosis and was discharged on 3/3 after paracentesis. He was rehospitalized from 3/21-22 by Dr. Roxan Hockey for VATS, pericardial window, and placement of Pleur-x catheter due to malignant effusions.   His last office visit with [name] was 4/28 and his weight was down 12 pounds, able to take only liquids; if he continues to have weight loss he may need to transition to palliative approach per the note.  He is currently being treated with Taxol/Cyramza.  He is only draining Pleur-x about once a week.  They have continued marinol but this makes him sleepy and so he doesn't eat.  He gets hiccups and is coughing and it makes it hard to talk.  He has been spitting after chemo but it has been more green and thick the last couple of days.  Yesterday he vomited mucopus a couple of times.  He is having dysphagia and feels like things get stuck in his lower throat.  Clearly has pill and solid dysphagia and sips sometimes also feel like they get stuck.  He is speaking quietly and it feels like it is harder to get sound out.  No fever.    ED Course: [facility] to [facility] transfer, per Dr. 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 During patient's hospitalization 05/2020 to patient underwent left Pleurx catheter placement as well as placement of a pericardial window. Patient has currently receiving chemotherapy, last round of chemo on 4/28. Patient presented to [place] with worsening cough and shortness of breath over the past several days. Work-up revealed bilateral cavitary lesions on CT imaging of the chest. ER provider discussed CT imaging with radiology and radiology feels that these are likely infectious in etiology, possibly septic emboli and not secondary to metastatic disease. Blood work revealed substantial neutropenia. Concern for early sepsis in this immunocompromised patient currently receiving chemotherapy and therefore patient has been given intravenous vancomycin and cefepime  Review of Systems: As per HPI; otherwise review of systems reviewed and negative.   Ambulatory Status:  Ambulates without assistance  COVID Vaccine Status:   Complete plus booster  Past Medical History:  Diagnosis Date  . Adenocarcinoma of cardio-esophageal junction ([place]) 12/10/2017  . Goals of care, counseling/discussion 12/10/2017  . Iron deficiency anemia due to chronic blood loss 12/11/2017  . Malignant neoplasm metastatic to liver ([place]) 12/10/2017  . 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 CHEST TUBE INSERTION Left 06/05/2020   Procedure: INSERTION PLEURAL DRAINAGE CATHETER;  Surgeon: [name], MD;  Location: [place];  Service: Thoracic;  Laterality: Left;  . CYST EXCISION Left 01/15/2016   Procedure: EXCISION SEBACEOUS CYST LEFT UPPER BACK;  Surgeon: Georganna Skeans, MD;  Location: [place];  Service: General;  Laterality: Left;  . IR IMAGING GUIDED PORT INSERTION  12/15/2017  . IR  PARACENTESIS  06/23/2020  . PERICARDIAL WINDOW N/A 06/05/2020   Procedure: PERICARDIAL WINDOW;  Surgeon: [name], MD;  Location: [place];  Service: Thoracic;  Laterality: N/A;  . PLEURAL BIOPSY N/A 06/05/2020   Procedure: PLEURAL BIOPSY;  Surgeon: [name], MD;  Location: [place];  Service: Thoracic;  Laterality: N/A;  . THORACENTESIS Left 05/17/2020   Procedure: THORACENTESIS;  Surgeon: Spero Geralds, MD;  Location: [facility] ENDOSCOPY;  Service: Pulmonary;  Laterality: Left;  Marland Kitchen VIDEO ASSISTED THORACOSCOPY Left 06/05/2020   Procedure: VIDEO ASSISTED THORACOSCOPY;  Surgeon: [name], MD;  Location: [facility] OR;  Service: Thoracic;  Laterality: Left;    Social History   Socioeconomic History  . Marital status: Married    Spouse name: Not on file  . Number of children: Not on file  . Years of education: Not on file  . Highest education level: Not on file  Occupational History  . Occupation: owns his own company  Tobacco Use  . Smoking status: Never Smoker  . Smokeless tobacco: Never Used  Vaping Use  . Vaping Use: Never used  Substance and Sexual Activity  . Alcohol use: Not Currently    Comment: socially  . Drug use: No  . Sexual activity: Not on file  Other Topics Concern  . Not on file  Social History Narrative  . Not on file   Social Determinants of Health   Financial Resource Strain: Not on file  Food Insecurity: Not on file  Transportation Needs: Not on file  Physical Activity: Not on file  Stress: Not on file  Social Connections: Not on file  Intimate Partner Violence: Not on file    No Known Allergies  History reviewed. No pertinent family history.  Prior to Admission medications   Medication Sig Start Date End Date Taking? Authorizing Provider  acetaminophen (TYLENOL) 500 MG tablet Take 500 mg by mouth every 6 (six) hours as needed for mild pain. Patient not taking: No sig reported    [provider]  baclofen (LIORESAL) 10 MG  tablet TAKE 1 TABLET (10 MG TOTAL) BY MOUTH EVERY 8 (EIGHT) HOURS AS NEEDED FOR MUSCLE SPASMS. 06/15/20 06/15/21  [name], MD  cetirizine (ZYRTEC) 10 MG tablet Take 10 mg by mouth daily as needed for allergies.    [provider]  dronabinol (MARINOL) 2.5 MG capsule Take 1 capsule (2.5 mg total) by mouth 2 (two) times daily before lunch and supper. Patient not taking: Reported on 07/13/2020 06/23/20   [name], MD  fentaNYL (DURAGESIC) 25 MCG/HR Place 1 patch onto the skin every 3 (three) days. 07/13/20   [name], MD  hydrocortisone (CORTEF) 10 MG tablet take 2 tablets by mouth (this can be crushed) twice a day Patient taking differently: take 2 tablets by mouth (this can be crushed) twice a day 06/28/20   [name], MD  lidocaine-prilocaine (EMLA) cream Apply to affected area once 06/28/20   Ennever, Rudell Cobb, MD  LORazepam (ATIVAN) 0.5 MG tablet Take 1 tablet (0.5 mg total) by mouth every 6 (six) hours as needed (Nausea or vomiting). Patient not taking: Reported on 07/13/2020 06/28/20   [name], MD  Multiple Vitamin (MULTI-VITAMIN DAILY  PO) Take 1 tablet by mouth daily.    [provider]  ondansetron (ZOFRAN) 8 MG tablet Take 1 tablet (8 mg total) by mouth 2 (two) times daily as needed (Nausea or vomiting). Patient not taking: Reported on 07/13/2020 06/28/20   [name], MD  Polyethyl Glycol-Propyl Glycol (SYSTANE OP) Place 1 drop into both eyes daily as needed (dry eyes). Patient not taking: Reported on 07/13/2020    [provider]  prochlorperazine (COMPAZINE) 10 MG tablet Take 1 tablet (10 mg total) by mouth every 6 (six) hours as needed (Nausea or vomiting). Patient not taking: Reported on 07/13/2020 06/28/20   [name], MD  spironolactone (ALDACTONE) 25 MG tablet TAKE 1 TABLET (25 MG TOTAL) BY MOUTH DAILY. 05/23/20 05/23/21  [name], MD  traMADol (ULTRAM) 50 MG tablet Take 1 tablet (50 mg total) by mouth every 6 (six)  hours as needed. 07/05/20   [name], MD  furosemide (LASIX) 40 MG tablet Take 1 tablet (40 mg total) by mouth daily. Start with 1/2 tab for 3days then increase to 1tab daily Patient not taking: Reported on 06/01/2020 05/19/20 06/06/20  Domenic Polite, MD    Physical Exam: Vitals:   07/18/20 0800 07/18/20 0900 07/18/20 0914 07/18/20 1040  BP: 101/75 101/78  103/75  Pulse: (!) 105 97  (!) 103  Resp: 16 16  18   Temp:   97.7 F (36.5 C) 98.1 F (36.7 C)  TempSrc:    Oral  SpO2: 94% 96%  97%  Weight:    55.7 kg  Height:    5\' 9"  (1.753 m)     . General:  Appears chronically very ill, quite cachectic, and significantly older than at the time of my last admission 2 months ago . Eyes:   EOMI, normal lids, iris . ENT:  grossly normal hearing, lips & tongue, mmm; speaks very softly . Neck:  no LAD, masses or thyromegaly . Cardiovascular:  RRR, no m/r/g. No LE edema.  Marland Kitchen Respiratory:   CTA bilaterally with no wheezes/rales/rhonchi.  Normal respiratory effort. . Abdomen:  soft, NT, ND . Back:   normal alignment, no CVAT . Skin:  no rash or induration seen on limited exam . Musculoskeletal:  grossly normal tone BUE/BLE, good ROM, no bony abnormality . Psychiatric:  blunted mood and affect, speech fluent and appropriate but quiet, AOx3 . Neurologic:  CN 2-12 grossly intact, moves all extremities in coordinated fashion    Radiological Exams on Admission: Independently reviewed - see discussion in A/P where applicable  DG Chest 2 View  Result Date: 07/17/2020 CLINICAL DATA:  Cough and shortness of breath for 1 day, esophageal carcinoma EXAM: CHEST - 2 VIEW COMPARISON:  07/04/2020 FINDINGS: Frontal and lateral views of the chest demonstrate stable right chest wall port and left-sided pleural drainage catheter. There has been interval resolution of the pneumomediastinum, with near complete resolution of subcutaneous emphysema seen previously. Cardiac silhouette is unremarkable. There is  multifocal bilateral airspace disease, greatest in the left lower lobe. Small left pleural effusion is unchanged. No pneumothorax. No acute bony abnormalities. IMPRESSION: 1. Progressive multifocal bilateral airspace disease. 2. Small left effusion. 3. Resolution of the pneumomediastinum seen previously. Near complete resolution of the chest wall subcutaneous emphysema. 4. Support devices as above. Electronically Signed   By: Randa Ngo M.D.   On: 07/17/2020 23:56   CT Chest W Contrast  Result Date: 07/18/2020 CLINICAL DATA:  History of pericardial window and pleural biopsy on June 05, 2020, presenting with productive cough and shortness of breath. EXAM: CT CHEST WITH CONTRAST TECHNIQUE: Multidetector CT imaging of the chest was performed during intravenous contrast administration. CONTRAST:  27mL OMNIPAQUE IOHEXOL 300 MG/ML  SOLN COMPARISON:  May 16, 2020 FINDINGS: Cardiovascular: A right-sided venous Port-A-Cath is in place. The thoracic aorta is normal in appearance without evidence of aneurysmal dilatation or dissection. The pulmonary arteries are normal in appearance, without evidence of intraluminal filling defects. Normal heart size. Trace amount of pericardial fluid is noted. Mediastinum/Nodes: No enlarged mediastinal, hilar, or axillary lymph nodes. The thyroid gland and trachea demonstrate no significant findings. A dilated, fluid-filled esophagus is seen. Lungs/Pleura: A single left-sided pleural drainage catheter is seen with its distal end extending along the anterior aspect of the left lung base. Its distal tip is seen just below the anterior medial aspect of the right atrium and right ventricle. Marked severity infiltrate is seen within the right lower lobe. This is increased in severity when compared to the prior study. Mild to moderate severity involvement of the adjacent portion of the left upper lobe is seen. Multiple small, ill-defined cavitary lung lesions are seen scattered throughout  the right upper lobe and superior aspect of the left lower lobe. These are new when compared to the prior study. Multiple small, ill-defined nodular appearing areas are seen scattered throughout the right middle lobe. There is a small left pleural effusion which is decreased in size when compared to the prior exam. This contains a few tiny foci of air within the lateral aspect of the left lung base. A mild to moderate amount of air is seen within the anterior aspect of the mediastinum. Upper Abdomen: There is a small hiatal hernia. The liver is cirrhotic in appearance. A large amount of abdominal free fluid is seen. Musculoskeletal: A mild to moderate amount of soft tissue air is seen along the lateral aspects of the upper chest wall, bilaterally. This extends to involve the soft tissues of the neck on the left. No acute or significant osseous findings. IMPRESSION: 1. Marked severity left lower lobe infiltrate with mild to moderate severity infiltrate within the adjacent portion of the left upper lobe. 2. Multiple, new ill-defined cavitary lung lesions involving predominantly the right upper lobe and left lower lobe which is concerning for sequelae associated with septic emboli. 3. Left-sided pleural drainage catheter positioning, as described above. 4. Small left pleural effusion which contains a tiny amount of pleural air and is decreased in size when compared to the prior study. 5. Small to moderate severity anterior pneumomediastinum. 6. Cirrhotic liver with a large amount of ascites. Electronically Signed   By: Virgina Norfolk M.D.   On: 07/18/2020 02:29    EKG: Independently reviewed.  Sinus tachycardia with rate 105; nonspecific ST changes with no evidence of acute ischemia   Labs on Admission: I have personally reviewed the available labs and imaging studies at the time of the admission.  Pertinent labs:   Glucose 112 AP 189 Albumin 2.1; 2.7 on 4/28 Bili 2.1; 0.8 on 4/28 HS troponin 11,  11 WBC 1.0; 3.2 on 4/28 Platelets 79; 132 on 4/28 ANC 680, moderate neutropenia COVID/flu negative   Assessment/Plan Principal Problem:   Cavitary pneumonia Active Problems:   Adenocarcinoma of cardio-esophageal junction (HCC)   Goals of care, counseling/discussion   S/P Left VATS with drainage of pleural effusion, pleural biopsy, creation of pericardial window, placement of pleur-x catheter   Cancer, metastatic (HCC)   Chemotherapy-induced neutropenia ([place])  Dysphagia   Malnutrition (HCC)    Cavitary PNA with neutropenia -Patient presenting with productive cough, SOB in the setting of active metastatic carcinoma, on chemotherapy -CXR with progressive multifocal B airspace disease -Chest CT with marked severity LLL infiltrate with LUL mild-moderate infiltrate; multiple cavitary lung lesions in the RUL and LLL, ?septic emboli; left-sided Pleur-x catheter; small to moderate anterior penumomediastinum -This may be c/w CAP vs. Aspiration PNA vs. Other infection given his immunocompromised state -Influenza negative. -COVID-19 negative. -Dr. Roxan Hockey was kind enough to review the films at home despite being off today; he agrees that this is a severe PNA, Pleur-x is in a good place, nothing surgical to do, and he will see the patient tomorrow. -Blood and sputum cultures, MRSA testing are pending. -Negative lactate, no current concerns for sepsis. -Additional complicating factors include: immunocompromise, active malignancy -Patient admitted to progressive care unit -Echo ordered, may need TEE -NS @ 75cc/hr -Fever control -Repeat CBC in am -Will add albuterol PRN -Will add Mucinex for cough -ID requested to consult on patient tomorrow -Continue Cefepime for now; will need to resume Vanc if MRSA PCR is positive  GE junction adenocarcinoma, metastatic -Patient with h/o metastatic adenoCA of the GE junction -He is currently being treated with Taxol/Cyramza -Followed by Dr.  Marin Olp -He had VATS, pericardial window, and Pleur-x catheter placement in March for malignant effusions -I have also notified [name] of admission and he will plan to see the patient -The family is so appreciative of their close relationship with [name]  -He has developed neutropenia and thrombocytopenia  Dysphagia -Patient now reports solid and pill dysphagia as well as some liquid dysphagia -He also has abnormal phonal quality -Suspect that his cavitary PNA is related to aspiration in the setting of esophageal cancer -Speech therapy evaluation -Full liquid diet for now  Malnutrition -Body mass index is 18.12 kg/m..  -He was 65.8 kg on 3/1 and is currently 55.7 kg -The patient has at least 2 indicators for malnutrition (insufficient energy intake, weight loss, loss of muscle mass, loss of subcutaneous fat.  -This is likely due to starvation related/chronic disease -Will obtain a nutrition consult for further recommendations. -He has been on Marinol with little response.  Goals of care -Patient and his wife have begun conversations about goals of care and are clearly aligned on his desires -For now, they are considering code status and not yet ready to make a decision -They know that they wish to treat the treatable and are inclined to ensure comfort first -They are open to palliative care consultation and a consult has been requested -Additionally, [name] is planning to discuss hospice with them     Note: This patient has been tested and is negative for the novel coronavirus COVID-19. The patient has been fully vaccinated against COVID-19.       Note: This patient has been tested and is negative for the novel coronavirus COVID-19. She has been fully vaccinated against COVID-19.   Level of care: Progressive DVT prophylaxis: Lovenox  Code Status:  Full - confirmed with patient/wife while they continue to discuss Family Communication: Wife was present  throughout evaluation Disposition Plan:  The patient is from: home  Anticipated d/c is to: home  Anticipated d/c date will depend on clinical response to treatment, likely several days  Patient is currently: acutely ill Consults called: Palliative care; Oncology; CVTS; ID; Nutrition; Speech therapy Admission status: Admit - It is my clinical opinion that admission to INPATIENT is reasonable  and necessary because of the expectation that this patient will require hospital care that crosses at least 2 midnights to treat this condition based on the medical complexity of the problems presented.  Given the aforementioned information, the predictability of an adverse outcome is felt to be significant.    Karmen Bongo MD [facility]   How to contact the [facility] Attending or Consulting provider [place] or covering provider during after hours [place], for this patient?  1. Check the care team in [facility] and look for a) attending/consulting [facility] provider listed and b) the [facility] team listed 2. Log into [email or web address] and use [place]'s universal password to access. If you do not have the password, please contact the hospital operator. 3. Locate the [facility] provider you are looking for under [facility] and page to a number that you can be directly reached. 4. If you still have difficulty reaching the provider, please page the [facility] (Director on Call) for the Hospitalists listed on amion for assistance.   07/18/2020, 6:10 PM

## 2020-07-18 NOTE — ED Notes (Signed)
Patient transported to CT 

## 2020-07-18 NOTE — ED Notes (Signed)
Pt transported to [facility] via [facility].  Stable prior to transport.

## 2020-07-18 NOTE — ED Notes (Addendum)
Report to Bonnita Nasuti, Therapist, sports at [facility]

## 2020-07-18 NOTE — ED Provider Notes (Signed)
[facility] EMERGENCY DEPARTMENT Provider Note   CSN: [id number] Arrival date & time: 07/17/20  2321     History Chief Complaint  Patient presents with  . Cough    Matthew Reynolds is a 55 y.o. male.  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  Patient reports ongoing worsening cough over the last several days.  At baseline he has some cough and drainage per his wife following chemotherapy but the cough has been fairly productive and more forceful.  He is now having associated chest discomfort.  He has also noted some shortness of breath.  No fevers at home.  No known sick contacts or COVID exposures.  Wife noted this evening that he also had some recurrent emesis.  This was nonbilious, nonbloody.  She states that they wanted to have them checked out.  He states that his pain is only when he coughs.  He rates his pain at 7 out of 10.  At baseline he has a fentanyl patch and uses oxycodone for any breakthrough pain.  He has not noted any lower extremity swelling and has no history of blood clots.  Of note, patient had significant cardiothoracic procedure on 3/21 including a left VATS with drainage of pleural effusion, pleural biopsy, pericardial window, and Pleurx catheter placement  Past Medical History:  Diagnosis Date  . 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 Goals of care, counseling/discussion 12/10/2017  . Iron deficiency anemia due to chronic blood loss 12/11/2017  . 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 Pleural effusion, malignant 06/05/2020  . S/P Left VATS with drainage of pleural effusion, pleural biopsy, creation of pericardial window, placement of pleur-x catheter 06/05/2020  .  Pleural effusion 05/17/2020  . Bilateral pleural effusion 05/17/2020  . Pericardial effusion 05/16/2020  . Iron deficiency anemia due to chronic blood loss 12/11/2017  . 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 Goals of care, counseling/discussion 12/10/2017  . 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 CHEST TUBE INSERTION Left 06/05/2020   Procedure: INSERTION PLEURAL DRAINAGE CATHETER;  Surgeon: [name], MD;  Location: [place];  Service: Thoracic;  Laterality: Left;  . CYST EXCISION Left 01/15/2016   Procedure: EXCISION SEBACEOUS CYST LEFT UPPER BACK;  Surgeon: Georganna Skeans, MD;  Location: [place];  Service: General;  Laterality: Left;  . IR IMAGING GUIDED PORT INSERTION  12/15/2017  . IR PARACENTESIS  06/23/2020  . PERICARDIAL WINDOW N/A 06/05/2020   Procedure: PERICARDIAL WINDOW;  Surgeon: [name], MD;  Location: [place];  Service: Thoracic;  Laterality: N/A;  . PLEURAL BIOPSY N/A 06/05/2020   Procedure: PLEURAL BIOPSY;  Surgeon: [name], MD;  Location: [place];  Service: Thoracic;  Laterality: N/A;  . THORACENTESIS Left 05/17/2020   Procedure: THORACENTESIS;  Surgeon: Spero Geralds, MD;  Location: [facility] ENDOSCOPY;  Service: Pulmonary;  Laterality: Left;  Marland Kitchen VIDEO ASSISTED THORACOSCOPY Left 06/05/2020   Procedure: VIDEO ASSISTED THORACOSCOPY;  Surgeon: [name], MD;  Location: [place];  Service: Thoracic;  Laterality: Left;       No family history on file.  Social History   Tobacco Use  . Smoking  status: Never Smoker  . Smokeless tobacco: Never Used  Vaping Use  . Vaping Use: Never used  Substance Use Topics  . Alcohol use: Yes    Comment: socially  . Drug use: No    Home Medications Prior to Admission medications   Medication Sig Start Date End Date Taking? Authorizing Provider  acetaminophen (TYLENOL) 500 MG tablet  Take 500 mg by mouth every 6 (six) hours as needed for mild pain. Patient not taking: No sig reported    [provider]  baclofen (LIORESAL) 10 MG tablet TAKE 1 TABLET (10 MG TOTAL) BY MOUTH EVERY 8 (EIGHT) HOURS AS NEEDED FOR MUSCLE SPASMS. 06/15/20 06/15/21  [name], MD  cetirizine (ZYRTEC) 10 MG tablet Take 10 mg by mouth daily as needed for allergies.    [provider]  dronabinol (MARINOL) 2.5 MG capsule Take 1 capsule (2.5 mg total) by mouth 2 (two) times daily before lunch and supper. Patient not taking: Reported on 07/13/2020 06/23/20   [name], MD  fentaNYL (DURAGESIC) 25 MCG/HR Place 1 patch onto the skin every 3 (three) days. 07/13/20   [name], MD  hydrocortisone (CORTEF) 10 MG tablet take 2 tablets by mouth (this can be crushed) twice a day Patient taking differently: take 2 tablets by mouth (this can be crushed) twice a day 06/28/20   [name], MD  lidocaine-prilocaine (EMLA) cream Apply to affected area once 06/28/20   Ennever, Rudell Cobb, MD  LORazepam (ATIVAN) 0.5 MG tablet Take 1 tablet (0.5 mg total) by mouth every 6 (six) hours as needed (Nausea or vomiting). Patient not taking: Reported on 07/13/2020 06/28/20   [name], MD  Multiple Vitamin (MULTI-VITAMIN DAILY PO) Take 1 tablet by mouth daily.    [provider]  ondansetron (ZOFRAN) 8 MG tablet Take 1 tablet (8 mg total) by mouth 2 (two) times daily as needed (Nausea or vomiting). Patient not taking: Reported on 07/13/2020 06/28/20   [name], MD  Polyethyl Glycol-Propyl Glycol (SYSTANE OP) Place 1 drop into both eyes daily as needed (dry eyes). Patient not taking: Reported on 07/13/2020    [provider]  prochlorperazine (COMPAZINE) 10 MG tablet Take 1 tablet (10 mg total) by mouth every 6 (six) hours as needed (Nausea or vomiting). Patient not taking: Reported on 07/13/2020 06/28/20   [name], MD  spironolactone (ALDACTONE) 25 MG tablet TAKE  1 TABLET (25 MG TOTAL) BY MOUTH DAILY. 05/23/20 05/23/21  [name], MD  traMADol (ULTRAM) 50 MG tablet Take 1 tablet (50 mg total) by mouth every 6 (six) hours as needed. 07/05/20   [name], MD  furosemide (LASIX) 40 MG tablet Take 1 tablet (40 mg total) by mouth daily. Start with 1/2 tab for 3days then increase to 1tab daily Patient not taking: Reported on 06/01/2020 05/19/20 06/06/20  Domenic Polite, MD    Allergies    Patient has no known allergies.  Review of Systems   Review of Systems  Constitutional: Positive for unexpected weight change. Negative for fever.  Respiratory: Positive for cough and shortness of breath.   Cardiovascular: Positive for chest pain. Negative for leg swelling.  Gastrointestinal: Positive for nausea and vomiting. Negative for abdominal pain.  Genitourinary: Negative for dysuria.  All other systems reviewed and are negative.   Physical Exam Updated Vital Signs BP 100/86   Pulse (!) 111   Temp (!) 97.5 F (36.4 C) (Oral)   Resp 16  Ht 1.753 m (5\' 9" )   Wt 55.8 kg   SpO2 94%   BMI 18.17 kg/m   Physical Exam Vitals and nursing note reviewed.  Constitutional:      Appearance: He is well-developed.     Comments: Thin, cachectic, nontoxic-appearing  HENT:     Head: Normocephalic and atraumatic.     Mouth/Throat:     Mouth: Mucous membranes are dry.  Eyes:     Pupils: Pupils are equal, round, and reactive to light.  Cardiovascular:     Rate and Rhythm: Regular rhythm. Tachycardia present.     Heart sounds: Normal heart sounds. No murmur heard.     Comments: Pleurx catheter Pulmonary:     Effort: Pulmonary effort is normal. No respiratory distress.     Breath sounds: Rhonchi present. No wheezing.     Comments: Diminished breath sounds right greater than left Port right upper chest Abdominal:     General: Bowel sounds are normal.     Palpations: Abdomen is soft.     Tenderness: There is no abdominal tenderness. There is no rebound.      Comments: Abdomen soft nontender, drain left mid abdomen  Musculoskeletal:        General: No tenderness.     Cervical back: Neck supple.     Right lower leg: No edema.     Left lower leg: No edema.  Lymphadenopathy:     Cervical: No cervical adenopathy.  Skin:    General: Skin is warm and dry.  Neurological:     Mental Status: He is alert and oriented to person, place, and time.  Psychiatric:        Mood and Affect: Mood normal.     ED Results / Procedures / Treatments   Labs (all labs ordered are listed, but only abnormal results are displayed) Labs Reviewed  CBC WITH DIFFERENTIAL/PLATELET - Abnormal; Notable for the following components:      Result Value   WBC 1.0 (*)    Platelets 79 (*)    Neutro Abs 0.7 (*)    Lymphs Abs 0.2 (*)    All other components within normal limits  COMPREHENSIVE METABOLIC PANEL - Abnormal; Notable for the following components:   Glucose, Bld 112 (*)    BUN 28 (*)    Calcium 8.2 (*)    Total Protein 5.3 (*)    Albumin 2.1 (*)    Alkaline Phosphatase 189 (*)    Total Bilirubin 2.1 (*)    All other components within normal limits  RESP PANEL BY RT-PCR (FLU A&B, COVID) ARPGX2  CULTURE, BLOOD (ROUTINE X 2)  CULTURE, BLOOD (ROUTINE X 2)  LACTIC ACID, PLASMA  TROPONIN I (HIGH SENSITIVITY)  TROPONIN I (HIGH SENSITIVITY)    EKG None  Radiology DG Chest 2 View  Result Date: 07/17/2020 CLINICAL DATA:  Cough and shortness of breath for 1 day, esophageal carcinoma EXAM: CHEST - 2 VIEW COMPARISON:  07/04/2020 FINDINGS: Frontal and lateral views of the chest demonstrate stable right chest wall port and left-sided pleural drainage catheter. There has been interval resolution of the pneumomediastinum, with near complete resolution of subcutaneous emphysema seen previously. Cardiac silhouette is unremarkable. There is multifocal bilateral airspace disease, greatest in the left lower lobe. Small left pleural effusion is unchanged. No pneumothorax.  No acute bony abnormalities. IMPRESSION: 1. Progressive multifocal bilateral airspace disease. 2. Small left effusion. 3. Resolution of the pneumomediastinum seen previously. Near complete resolution of the chest wall subcutaneous emphysema. 4.  Support devices as above. Electronically Signed   By: Randa Ngo M.D.   On: 07/17/2020 23:56   CT Chest W Contrast  Result Date: 07/18/2020 CLINICAL DATA:  History of pericardial window and pleural biopsy on June 05, 2020, presenting with productive cough and shortness of breath. EXAM: CT CHEST WITH CONTRAST TECHNIQUE: Multidetector CT imaging of the chest was performed during intravenous contrast administration. CONTRAST:  66mL OMNIPAQUE IOHEXOL 300 MG/ML  SOLN COMPARISON:  May 16, 2020 FINDINGS: Cardiovascular: A right-sided venous Port-A-Cath is in place. The thoracic aorta is normal in appearance without evidence of aneurysmal dilatation or dissection. The pulmonary arteries are normal in appearance, without evidence of intraluminal filling defects. Normal heart size. Trace amount of pericardial fluid is noted. Mediastinum/Nodes: No enlarged mediastinal, hilar, or axillary lymph nodes. The thyroid gland and trachea demonstrate no significant findings. A dilated, fluid-filled esophagus is seen. Lungs/Pleura: A single left-sided pleural drainage catheter is seen with its distal end extending along the anterior aspect of the left lung base. Its distal tip is seen just below the anterior medial aspect of the right atrium and right ventricle. Marked severity infiltrate is seen within the right lower lobe. This is increased in severity when compared to the prior study. Mild to moderate severity involvement of the adjacent portion of the left upper lobe is seen. Multiple small, ill-defined cavitary lung lesions are seen scattered throughout the right upper lobe and superior aspect of the left lower lobe. These are new when compared to the prior study. Multiple small,  ill-defined nodular appearing areas are seen scattered throughout the right middle lobe. There is a small left pleural effusion which is decreased in size when compared to the prior exam. This contains a few tiny foci of air within the lateral aspect of the left lung base. A mild to moderate amount of air is seen within the anterior aspect of the mediastinum. Upper Abdomen: There is a small hiatal hernia. The liver is cirrhotic in appearance. A large amount of abdominal free fluid is seen. Musculoskeletal: A mild to moderate amount of soft tissue air is seen along the lateral aspects of the upper chest wall, bilaterally. This extends to involve the soft tissues of the neck on the left. No acute or significant osseous findings. IMPRESSION: 1. Marked severity left lower lobe infiltrate with mild to moderate severity infiltrate within the adjacent portion of the left upper lobe. 2. Multiple, new ill-defined cavitary lung lesions involving predominantly the right upper lobe and left lower lobe which is concerning for sequelae associated with septic emboli. 3. Left-sided pleural drainage catheter positioning, as described above. 4. Small left pleural effusion which contains a tiny amount of pleural air and is decreased in size when compared to the prior study. 5. Small to moderate severity anterior pneumomediastinum. 6. Cirrhotic liver with a large amount of ascites. Electronically Signed   By: Virgina Norfolk M.D.   On: 07/18/2020 02:29    Procedures .Critical Care Performed by: Merryl Hacker, MD Authorized by: Merryl Hacker, MD   Critical care provider statement:    Critical care time (minutes):  45   Critical care was necessary to treat or prevent imminent or life-threatening deterioration of the following conditions:  Sepsis   Critical care was time spent personally by me on the following activities:  Discussions with consultants, evaluation of patient's response to treatment, examination of  patient, ordering and performing treatments and interventions, ordering and review of laboratory studies, ordering and review of  radiographic studies, pulse oximetry, re-evaluation of patient's condition, obtaining history from patient or surrogate and review of old charts     Medications Ordered in ED Medications  vancomycin (VANCOCIN) IVPB 1000 mg/200 mL premix (has no administration in time range)  ceFEPIme (MAXIPIME) 2 g in sodium chloride 0.9 % 100 mL IVPB (has no administration in time range)  lactated ringers infusion (has no administration in time range)  ondansetron (ZOFRAN) injection 4 mg (4 mg Intravenous Given 07/18/20 0013)  HYDROmorphone (DILAUDID) injection 1 mg (1 mg Intravenous Given 07/18/20 0013)  sodium chloride 0.9 % bolus 1,000 mL (0 mLs Intravenous Stopped 07/18/20 0212)  iohexol (OMNIPAQUE) 300 MG/ML solution 80 mL (80 mLs Intravenous Contrast Given 07/18/20 0126)  HYDROmorphone (DILAUDID) injection 1 mg (1 mg Intravenous Given 07/18/20 0220)    ED Course  I have reviewed the triage vital signs and the nursing notes.  Pertinent labs & imaging results that were available during my care of the patient were reviewed by me and considered in my medical decision making (see chart for details).  Clinical Course as of 07/18/20 0255  Tue Jul 18, 2020  0229 Spoke with radiology.  Multiple abnormal findings on CT scan likely related to prior VATS and pleural catheter.  However, does have multiple cavitary lesions concerning for septic emboli.  He is neutropenic with a neutrophil count of 700.  This puts him at increased risk regardless of absence of fever. Vancomycin and cefepime were ordered.  He has not been hemodynamically unstable and his lactate is normal.  Plan for admission. [CH]    Clinical Course User Index [CH] Mable Lashley, Barbette Hair, MD   MDM Rules/Calculators/A&P                          Patient presents with cough and chest discomfort.  He is chronically ill-appearing and  cachectic.  Ongoing chemotherapy.  He is afebrile on initial evaluation.  Pulse rate 110s.  O2 sats 95%.  He is not in any acute respiratory distress.  Work-up initiated including chest x-ray, basic lab work, troponin, EKG.  EKG without acute ischemic or arrhythmic changes.  Chest x-ray shows multifocal airspace disease.  For this reason, blood cultures and lactate were added for septic evaluation.  He is not hypotensive at this time.  Given his general cachectic and wasted appearance, 1 L of fluids was ordered.  We will hold off on aggressive fluid resuscitation until lactate has resulted as he is afebrile otherwise.  X-ray findings are new.  He also has a history of metastatic disease and question whether this could be mets.  For this reason, will obtain CT imaging to better characterize.  Patient notably leukopenic and neutropenic with absolute neutrophil count of 700.  This would put him at greater risk for opportunistic infection.  Spoke with radiology regarding CT.  He has severe left lower lobe infiltrate and multiple new cavitary lesions concerning for septic emboli.  He has persistent small to moderate pneumomediastinum and left pleural effusion likely related to prior VATS procedure.  Patient was started on vancomycin and cefepime.  His lactate is normal and he is remained hemodynamically stable without hypotension.  He has been persistently tachycardic in the 110s.  We will start LR at 150 an hour.  He technically meets criteria for severe sepsis given neutropenia and tachycardia with known source of infection.  Discussed with the patient and his wife findings.  We will plan for admission to  the hospital for IV antibiotics with likely oncology and/or cardiothoracic evaluation.  They state that his Pleurx catheter was due to be removed this week.  COVID and influenza testing negative.  Final Clinical Impression(s) / ED Diagnoses Final diagnoses:  Multifocal pneumonia  Acute septic pulmonary embolism  without acute cor pulmonale (HCC)  Chemotherapy-induced neutropenia (HCC)  Severe sepsis [facility])    Rx / DC Orders ED Discharge Orders    None       Kafi Dotter, Barbette Hair, MD 07/18/20 [phone]

## 2020-07-18 NOTE — ED Notes (Signed)
Report to [facility] from [facility]

## 2020-07-18 NOTE — Progress Notes (Signed)
Pharmacy Antibiotic Note  Matthew Reynolds is a 55 y.o. male admitted on 07/17/2020 with pneumonia.  Pharmacy has been consulted for cefepime dosing.  Plan: Cefepime 2gm IV q8 hours F/u renal function, cultures and clinical course  Height: 5\' 9"  (175.3 cm) Weight: 55.8 kg (123 lb 0.3 oz) IBW/kg (Calculated) : 70.7  Temp (24hrs), Avg:97.5 F (36.4 C), Min:97.5 F (36.4 C), Max:97.5 F (36.4 C)  Recent Labs  Lab 07/13/20 1001 07/18/20 0005 07/18/20 0040  WBC 3.2* 1.0*  --   CREATININE 0.68 0.65  --   LATICACIDVEN  --   --  1.2    Estimated Creatinine Clearance: 83.3 mL/min (by C-G formula based on SCr of 0.65 mg/dL).    No Known Allergies   Thank you for allowing pharmacy to be a part of this patient's care.  Henley Blyth, [place] 07/18/2020 2:39 AM

## 2020-07-18 NOTE — Progress Notes (Signed)
SLP Cancellation Note  Patient Details Name: Matthew Reynolds MRN: [id number] DOB: 05-03-1965   Cancelled treatment:       Reason Eval/Treat Not Completed: Other (comment) Attempted to see pt x2 this afternoon but he was meeting with an attorney per nursing. Will f/u as able for swallowing evaluation.     Osie Bond., M.A. [place] [facility] Pager [phone] Office [phone]  07/18/2020, 4:31 PM

## 2020-07-18 NOTE — Consult Note (Addendum)
[facility]  [phone] Fax:([phone]) [place]  Reason for Consultation: Neutropenia, metastatic adenocarcinoma the GE junction  HPI: Matthew Reynolds is a 55 year old male with a past medical history significant for metastatic adenocarcinoma the GE junction.  The patient has received multiple lines of therapy and is currently receiving treatment with Taxol/Cyramza which was started on 06/28/2020.  He received day 15 of cycle #1 on 07/13/2020.  He presented to the hospital with cough and shortness of breath.  He has been having issues with weight loss, dysphagia for pills and solids, hiccups, and intermittent vomiting.  Labs on admission showed WBC of 1.0, platelets 79,000, ANC 0.7, calcium 8.2, total protein 5.3, albumin 2.1, alk phos 189, T bili 2.1.  He had a CT of the chest with contrast which showed marked severity left lower lobe infiltrate with mild to moderate severity infiltrate within the adjacent portion of the left upper lobe, multiple new ill-defined cavitary lung lesions involving predominantly the right upper lobe and left lower lobe which is concerning for sequelae associate with septic emboli, small left pleural effusion, small to moderate severity anterior pneumomediastinum, cirrhotic liver with large amount of ascites.  The patient was seen in his hospital room today.  His wife is at the bedside.  She tells me that he has had increased sinus drainage and cough.  His drainage is darker in color.  He has been having some increased shortness of breath.  He denies having any fevers or chills at home.  He reports worsening dysphagia.  He can take in some thin liquids but cannot take an any solids really.  He has ongoing weight loss.  He has no chest pain, abdominal pain.  He reports some mild nausea and vomiting.  No bleeding reported.  Medical oncology was asked see the patient make recommendations regarding his neutropenia and metastatic  adenocarcinoma the GE junction.   Past Medical History:  Diagnosis Date  . Adenocarcinoma of cardio-esophageal junction ([place]) 12/10/2017  . Goals of care, counseling/discussion 12/10/2017  . Iron deficiency anemia due to chronic blood loss 12/11/2017  . Malignant neoplasm metastatic to liver ([place]) 12/10/2017  . 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 CHEST TUBE INSERTION Left 06/05/2020   Procedure: INSERTION PLEURAL DRAINAGE CATHETER;  Surgeon: [name], MD;  Location: [facility];  Service: Thoracic;  Laterality: Left;  . CYST EXCISION Left 01/15/2016   Procedure: EXCISION SEBACEOUS CYST LEFT UPPER BACK;  Surgeon: Georganna Skeans, MD;  Location: [facility];  Service: General;  Laterality: Left;  . IR IMAGING GUIDED PORT INSERTION  12/15/2017  . IR PARACENTESIS  06/23/2020  . PERICARDIAL WINDOW N/A 06/05/2020   Procedure: PERICARDIAL WINDOW;  Surgeon: [name], MD;  Location: [place] [facility];  Service: Thoracic;  Laterality: N/A;  . PLEURAL BIOPSY N/A 06/05/2020   Procedure: PLEURAL BIOPSY;  Surgeon: [name], MD;  Location: [place];  Service: Thoracic;  Laterality: N/A;  . THORACENTESIS Left 05/17/2020   Procedure: THORACENTESIS;  Surgeon: Spero Geralds, MD;  Location: [facility] ENDOSCOPY;  Service: Pulmonary;  Laterality: Left;  Marland Kitchen VIDEO ASSISTED THORACOSCOPY Left 06/05/2020   Procedure: VIDEO ASSISTED THORACOSCOPY;  Surgeon: [name], MD;  Location: [facility] OR;  Service: Thoracic;  Laterality: Left;  :  Current Facility-Administered Medications  Medication Dose Route Frequency Provider Last Rate Last Admin  . 0.9 %  sodium chloride infusion  Intravenous Continuous Karmen Bongo, MD 75 mL/hr at 07/18/20 1339 New Bag at 07/18/20 1339  . acetaminophen (TYLENOL) tablet 650 mg  650 mg Oral Q6H PRN Karmen Bongo, MD       Or  . acetaminophen (TYLENOL) suppository 650 mg  650 mg  Rectal Q6H PRN Karmen Bongo, MD      . albuterol (PROVENTIL) (2.5 MG/3ML) 0.083% nebulizer solution 2.5 mg  2.5 mg Nebulization Q2H PRN Karmen Bongo, MD      . baclofen (LIORESAL) tablet 10 mg  10 mg Oral Q8H PRN Karmen Bongo, MD      . bisacodyl (DULCOLAX) EC tablet 5 mg  5 mg Oral Daily PRN Karmen Bongo, MD      . ceFEPIme (MAXIPIME) 2 g in sodium chloride 0.9 % 100 mL IVPB  2 g Intravenous Lynne Logan, MD 200 mL/hr at 07/18/20 1151 2 g at 07/18/20 1151  . docusate sodium (COLACE) capsule 100 mg  100 mg Oral BID Karmen Bongo, MD      . dronabinol (MARINOL) capsule 2.5 mg  2.5 mg Oral BID Meliton Rattan, MD      . enoxaparin (LOVENOX) injection 40 mg  40 mg Subcutaneous Q24H Karmen Bongo, MD      . Derrill Memo ON 07/19/2020] fentaNYL (DURAGESIC) 25 MCG/HR 1 patch  1 patch Transdermal Q72H Karmen Bongo, MD      . guaiFENesin (Lead) 12 hr tablet 600 mg  600 mg Oral BID PRN Karmen Bongo, MD      . hydrALAZINE (APRESOLINE) injection 5 mg  5 mg Intravenous Q4H PRN Karmen Bongo, MD      . HYDROmorphone (DILAUDID) injection 1 mg  1 mg Intravenous Q3H PRN Karmen Bongo, MD   1 mg at 07/18/20 1216  . ondansetron (ZOFRAN) tablet 4 mg  4 mg Oral Q6H PRN Karmen Bongo, MD       Or  . ondansetron [facility]) injection 4 mg  4 mg Intravenous Q6H PRN Karmen Bongo, MD      . polyethylene glycol (MIRALAX / GLYCOLAX) packet 17 g  17 g Oral Daily PRN Karmen Bongo, MD      . sodium chloride flush (NS) 0.9 % injection 3 mL  3 mL Intravenous Q12H Karmen Bongo, MD      . traMADol Veatrice Bourbon) tablet 50 mg  50 mg Oral Q6H PRN Karmen Bongo, MD       Facility-Administered Medications Ordered in Other Encounters  Medication Dose Route Frequency Provider Last Rate Last Admin  . sodium chloride flush (NS) 0.9 % injection 10 mL  10 mL Intravenous PRN [name], MD   10 mL at 03/31/18 1100     No Known Allergies:  History reviewed. No pertinent family  history.:  Social History   Socioeconomic History  . Marital status: Married    Spouse name: Not on file  . Number of children: Not on file  . Years of education: Not on file  . Highest education level: Not on file  Occupational History  . Occupation: owns his own company  Tobacco Use  . Smoking status: Never Smoker  . Smokeless tobacco: Never Used  Vaping Use  . Vaping Use: Never used  Substance and Sexual Activity  . Alcohol use: Not Currently    Comment: socially  . Drug use: No  . Sexual activity: Not on file  Other Topics Concern  . Not on file  Social History Narrative  . Not on file   Social Determinants  of Health   Financial Resource Strain: Not on file  Food Insecurity: Not on file  Transportation Needs: Not on file  Physical Activity: Not on file  Stress: Not on file  Social Connections: Not on file  Intimate Partner Violence: Not on file  :  Review of Systems: A comprehensive 14 point review of systems was negative except as noted in the HPI.  Exam: Patient Vitals for the past 24 hrs:  BP Temp Temp src Pulse Resp SpO2 Height Weight  07/18/20 1040 103/75 98.1 F (36.7 C) Oral (!) 103 18 97 % 5' 9"  (1.753 m) 55.7 kg  07/18/20 0914 -- 97.7 F (36.5 C) -- -- -- -- -- --  07/18/20 0900 101/78 -- -- 97 16 96 % -- --  07/18/20 0800 101/75 -- -- (!) 105 16 94 % -- --  07/18/20 0700 98/75 -- -- (!) 116 15 93 % -- --  07/18/20 0500 104/81 -- -- (!) 105 -- 95 % -- --  07/18/20 0400 103/75 -- -- (!) 108 16 94 % -- --  07/18/20 0330 105/76 -- -- (!) 115 16 94 % -- --  07/18/20 0300 98/69 -- -- (!) 115 16 93 % -- --  07/18/20 0230 104/70 -- -- (!) 110 -- 92 % -- --  07/18/20 0225 100/86 -- -- (!) 111 16 94 % -- --  07/18/20 0115 110/81 -- -- (!) 114 (!) 21 93 % -- --  07/18/20 0100 113/71 -- -- (!) 105 (!) 21 96 % -- --  07/18/20 0045 109/83 -- -- (!) 115 (!) 22 94 % -- --  07/18/20 0030 104/81 -- -- (!) 116 20 95 % -- --  07/17/20 2331 -- -- -- -- -- -- 5' 9"   (1.753 m) 55.8 kg  07/17/20 2330 (!) 120/96 (!) 97.5 F (36.4 C) Oral (!) 111 20 95 % -- --    General: Chronically ill-appearing male, no distress, cachectic Eyes:  no scleral icterus.   ENT:  There were no oropharyngeal lesions.     Respiratory: lungs were clear bilaterally without wheezing or crackles.   Cardiovascular: Tachycardic, trace bilateral lower extremity edema. GI: Positive bowel sounds, distended, nontender   Skin exam was without echymosis, petichae.   Neuro exam was nonfocal. Patient was alert and oriented.  Attention was good. Language was appropriate.  Mood was normal without depression.  Speech was not pressured.  Thought content was not tangential.     Lab Results  Component Value Date   WBC 1.0 (LL) 07/18/2020   HGB 14.2 07/18/2020   HCT 43.5 07/18/2020   PLT 79 (L) 07/18/2020   GLUCOSE 112 (H) 07/18/2020   ALT 30 07/18/2020   AST 35 07/18/2020   NA 138 07/18/2020   K 5.1 07/18/2020   CL 102 07/18/2020   CREATININE 0.65 07/18/2020   BUN 28 (H) 07/18/2020   CO2 27 07/18/2020    DG Chest 2 View  Result Date: 07/17/2020 CLINICAL DATA:  Cough and shortness of breath for 1 day, esophageal carcinoma EXAM: CHEST - 2 VIEW COMPARISON:  07/04/2020 FINDINGS: Frontal and lateral views of the chest demonstrate stable right chest wall port and left-sided pleural drainage catheter. There has been interval resolution of the pneumomediastinum, with near complete resolution of subcutaneous emphysema seen previously. Cardiac silhouette is unremarkable. There is multifocal bilateral airspace disease, greatest in the left lower lobe. Small left pleural effusion is unchanged. No pneumothorax. No acute bony abnormalities. IMPRESSION: 1. Progressive multifocal bilateral  airspace disease. 2. Small left effusion. 3. Resolution of the pneumomediastinum seen previously. Near complete resolution of the chest wall subcutaneous emphysema. 4. Support devices as above. Electronically Signed    By: Randa Ngo M.D.   On: 07/17/2020 23:56   DG Chest 2 View  Result Date: 07/04/2020 CLINICAL DATA:  Lung cancer post VATS EXAM: CHEST - 2 VIEW COMPARISON:  06/20/2020 FINDINGS: RIGHT jugular Port-A-Cath with tip projecting over SVC. LEFT basilar thoracostomy tube, proximal side-port external to costal margin. Normal heart size, mediastinal contours, and pulmonary vascularity. Pneumomediastinum present. Small bibasilar pleural effusions with tiny loculated pneumothorax at LEFT base. LEFT basilar atelectasis. Significant chest wall emphysema LEFT greater than RIGHT extending into axillae and cervical regions bilaterally. Large rounded soft tissue density projects over RIGHT upper lobe at the supraclavicular region, question skin fold. No definite RIGHT pneumothorax. Osseous structures unremarkable. IMPRESSION: Proximal side-port of LEFT thoracostomy tube is external to the costal margin. Small BILATERAL pleural effusions, LEFT greater than RIGHT, with tiny LEFT basilar pneumothorax and LEFT basilar atelectasis. Extensive chest wall emphysema and pneumomediastinum. These results will be called to the ordering clinician or representative by the Radiologist Assistant, and communication documented in the PACS or [employer]. Electronically Signed   By: Lavonia Dana M.D.   On: 07/04/2020 08:52   DG Chest 2 View  Result Date: 06/20/2020 CLINICAL DATA:  55 year old male with gastrointestinal carcinoma. Malignant pleural and pericardial effusions. Status post left VATS and pericardial window on 06/05/2020. EXAM: CHEST - 2 VIEW COMPARISON:  Portable chest 06/06/2020 and earlier. FINDINGS: PA and lateral views. Stable right chest power port. No cardiomegaly. Normal cardiac size and mediastinal contours. Visualized tracheal air column is within normal limits. No pneumothorax or pulmonary edema. Improved bibasilar ventilation from last month but small to moderate bilateral pleural effusions persist. No confluent  pulmonary opacity. No acute osseous abnormality identified. Paucity of bowel gas in the abdomen. IMPRESSION: 1. Improved bibasilar ventilation from last month but small to moderate bilateral pleural effusions persist - approximately stable on the left and slightly larger on the right to those demonstrated on CT 05/16/2020. 2. Normal cardiac and mediastinal contours. Electronically Signed   By: Genevie Ann M.D.   On: 06/20/2020 09:08   CT Chest W Contrast  Result Date: 07/18/2020 CLINICAL DATA:  History of pericardial window and pleural biopsy on June 05, 2020, presenting with productive cough and shortness of breath. EXAM: CT CHEST WITH CONTRAST TECHNIQUE: Multidetector CT imaging of the chest was performed during intravenous contrast administration. CONTRAST:  45mL OMNIPAQUE IOHEXOL 300 MG/ML  SOLN COMPARISON:  May 16, 2020 FINDINGS: Cardiovascular: A right-sided venous Port-A-Cath is in place. The thoracic aorta is normal in appearance without evidence of aneurysmal dilatation or dissection. The pulmonary arteries are normal in appearance, without evidence of intraluminal filling defects. Normal heart size. Trace amount of pericardial fluid is noted. Mediastinum/Nodes: No enlarged mediastinal, hilar, or axillary lymph nodes. The thyroid gland and trachea demonstrate no significant findings. A dilated, fluid-filled esophagus is seen. Lungs/Pleura: A single left-sided pleural drainage catheter is seen with its distal end extending along the anterior aspect of the left lung base. Its distal tip is seen just below the anterior medial aspect of the right atrium and right ventricle. Marked severity infiltrate is seen within the right lower lobe. This is increased in severity when compared to the prior study. Mild to moderate severity involvement of the adjacent portion of the left upper lobe is seen. Multiple small, ill-defined cavitary lung  lesions are seen scattered throughout the right upper lobe and superior aspect  of the left lower lobe. These are new when compared to the prior study. Multiple small, ill-defined nodular appearing areas are seen scattered throughout the right middle lobe. There is a small left pleural effusion which is decreased in size when compared to the prior exam. This contains a few tiny foci of air within the lateral aspect of the left lung base. A mild to moderate amount of air is seen within the anterior aspect of the mediastinum. Upper Abdomen: There is a small hiatal hernia. The liver is cirrhotic in appearance. A large amount of abdominal free fluid is seen. Musculoskeletal: A mild to moderate amount of soft tissue air is seen along the lateral aspects of the upper chest wall, bilaterally. This extends to involve the soft tissues of the neck on the left. No acute or significant osseous findings. IMPRESSION: 1. Marked severity left lower lobe infiltrate with mild to moderate severity infiltrate within the adjacent portion of the left upper lobe. 2. Multiple, new ill-defined cavitary lung lesions involving predominantly the right upper lobe and left lower lobe which is concerning for sequelae associated with septic emboli. 3. Left-sided pleural drainage catheter positioning, as described above. 4. Small left pleural effusion which contains a tiny amount of pleural air and is decreased in size when compared to the prior study. 5. Small to moderate severity anterior pneumomediastinum. 6. Cirrhotic liver with a large amount of ascites. Electronically Signed   By: Virgina Norfolk M.D.   On: 07/18/2020 02:29   US Abdomen Complete  Result Date: 06/22/2020 CLINICAL DATA:  Hepatic cirrhosis.  Esophageal carcinoma EXAM: ABDOMEN ULTRASOUND COMPLETE COMPARISON:  Ultrasound right upper quadrant May 18, 2020; CT abdomen and pelvis May 16, 2020 FINDINGS: Gallbladder: Gallbladder wall is thickened diffusely and contracted. Suggestion of ring down type artifact from the gallbladder wall may indicate  inflammation such as adenomyomatosis or cholesterolosis. No gallstones evident. No pericholecystic fluid. No sonographic Murphy sign noted by sonographer. Common bile duct: Diameter: 2 mm. No intrahepatic, common hepatic, or common bile duct dilatation. Liver: No focal lesion identified. The liver has a diffusely nodular contour. The liver is overall diminished in size. The liver echogenicity is increased and coarsened. Portal vein is patent on color Doppler imaging with normal direction of blood flow towards the liver. IVC: No abnormality visualized in regions that can be interrogated. Visualized portions of inferior vena cava appear unremarkable. Pancreas: Visualized portion unremarkable. Portions of pancreas obscured by gas. Spleen: Size and appearance within normal limits. Right Kidney: Length: 9.6 cm. Echogenicity within normal limits. No mass or hydronephrosis visualized. Left Kidney: Length: 10.4 cm. Echogenicity within normal limits. No mass or hydronephrosis visualized. Abdominal aorta: No aneurysm visualized. Other findings: There is moderate diffuse ascites. Right pleural effusion noted. IMPRESSION: 1. The appearance of the liver is consistent with cirrhosis. No focal liver lesions evident. Note that the sensitivity of ultrasound for detection of focal liver lesions is diminished in this circumstance. 2. Gallbladder is contracted with thickened wall. Ascites and hepatic cirrhosis may result in gallbladder wall thickening. No gallstones are evident. Note that there is a degree of ring down type artifact suggesting underlying adenomatosis or cholesterolosis within the gallbladder. 3. Portions of inferior vena cava and pancreas obscured by gas. Visualized portions of the structures appear normal. 4.  Splenic size within normal limits. 5.  Ascites.  Right pleural effusion noted. Electronically Signed   By: Lowella Grip III M.D.  On: 06/22/2020 14:56   IR Paracentesis  Result Date:  06/23/2020 INDICATION: Patient with history of GE junction adenocarcinoma with recent development of ascites. Patient presents for diagnostic and therapeutic paracentesis. EXAM: ULTRASOUND GUIDED DIAGNOSTIC AND THERAPEUTIC PARACENTESIS MEDICATIONS: 10 mL 1% lidocaine COMPLICATIONS: None immediate. PROCEDURE: Informed written consent was obtained from the patient after a discussion of the risks, benefits and alternatives to treatment. A timeout was performed prior to the initiation of the procedure. Initial ultrasound scanning demonstrates a large amount of ascites within the right lower abdominal quadrant. The right lower abdomen was prepped and draped in the usual sterile fashion. 1% lidocaine was used for local anesthesia. Following this, a 19 gauge, 7-cm, Yueh catheter was introduced. An ultrasound image was saved for documentation purposes. The paracentesis was performed. The catheter was removed and a dressing was applied. The patient tolerated the procedure well without immediate post procedural complication. FINDINGS: A total of approximately 5.1 liters of yellow fluid was removed. Samples were sent to the laboratory as requested by the clinical team. IMPRESSION: Successful ultrasound-guided paracentesis yielding 5.1 liters of peritoneal fluid. Read by: Brynda Greathouse PA-C Electronically Signed   By: Corrie Mckusick D.O.   On: 06/23/2020 15:57     DG Chest 2 View  Result Date: 07/17/2020 CLINICAL DATA:  Cough and shortness of breath for 1 day, esophageal carcinoma EXAM: CHEST - 2 VIEW COMPARISON:  07/04/2020 FINDINGS: Frontal and lateral views of the chest demonstrate stable right chest wall port and left-sided pleural drainage catheter. There has been interval resolution of the pneumomediastinum, with near complete resolution of subcutaneous emphysema seen previously. Cardiac silhouette is unremarkable. There is multifocal bilateral airspace disease, greatest in the left lower lobe. Small left pleural  effusion is unchanged. No pneumothorax. No acute bony abnormalities. IMPRESSION: 1. Progressive multifocal bilateral airspace disease. 2. Small left effusion. 3. Resolution of the pneumomediastinum seen previously. Near complete resolution of the chest wall subcutaneous emphysema. 4. Support devices as above. Electronically Signed   By: Randa Ngo M.D.   On: 07/17/2020 23:56   DG Chest 2 View  Result Date: 07/04/2020 CLINICAL DATA:  Lung cancer post VATS EXAM: CHEST - 2 VIEW COMPARISON:  06/20/2020 FINDINGS: RIGHT jugular Port-A-Cath with tip projecting over SVC. LEFT basilar thoracostomy tube, proximal side-port external to costal margin. Normal heart size, mediastinal contours, and pulmonary vascularity. Pneumomediastinum present. Small bibasilar pleural effusions with tiny loculated pneumothorax at LEFT base. LEFT basilar atelectasis. Significant chest wall emphysema LEFT greater than RIGHT extending into axillae and cervical regions bilaterally. Large rounded soft tissue density projects over RIGHT upper lobe at the supraclavicular region, question skin fold. No definite RIGHT pneumothorax. Osseous structures unremarkable. IMPRESSION: Proximal side-port of LEFT thoracostomy tube is external to the costal margin. Small BILATERAL pleural effusions, LEFT greater than RIGHT, with tiny LEFT basilar pneumothorax and LEFT basilar atelectasis. Extensive chest wall emphysema and pneumomediastinum. These results will be called to the ordering clinician or representative by the Radiologist Assistant, and communication documented in the PACS or [employer]. Electronically Signed   By: Lavonia Dana M.D.   On: 07/04/2020 08:52   DG Chest 2 View  Result Date: 06/20/2020 CLINICAL DATA:  55 year old male with gastrointestinal carcinoma. Malignant pleural and pericardial effusions. Status post left VATS and pericardial window on 06/05/2020. EXAM: CHEST - 2 VIEW COMPARISON:  Portable chest 06/06/2020 and earlier.  FINDINGS: PA and lateral views. Stable right chest power port. No cardiomegaly. Normal cardiac size and mediastinal contours. Visualized tracheal air column  is within normal limits. No pneumothorax or pulmonary edema. Improved bibasilar ventilation from last month but small to moderate bilateral pleural effusions persist. No confluent pulmonary opacity. No acute osseous abnormality identified. Paucity of bowel gas in the abdomen. IMPRESSION: 1. Improved bibasilar ventilation from last month but small to moderate bilateral pleural effusions persist - approximately stable on the left and slightly larger on the right to those demonstrated on CT 05/16/2020. 2. Normal cardiac and mediastinal contours. Electronically Signed   By: Genevie Ann M.D.   On: 06/20/2020 09:08   CT Chest W Contrast  Result Date: 07/18/2020 CLINICAL DATA:  History of pericardial window and pleural biopsy on June 05, 2020, presenting with productive cough and shortness of breath. EXAM: CT CHEST WITH CONTRAST TECHNIQUE: Multidetector CT imaging of the chest was performed during intravenous contrast administration. CONTRAST:  35m OMNIPAQUE IOHEXOL 300 MG/ML  SOLN COMPARISON:  May 16, 2020 FINDINGS: Cardiovascular: A right-sided venous Port-A-Cath is in place. The thoracic aorta is normal in appearance without evidence of aneurysmal dilatation or dissection. The pulmonary arteries are normal in appearance, without evidence of intraluminal filling defects. Normal heart size. Trace amount of pericardial fluid is noted. Mediastinum/Nodes: No enlarged mediastinal, hilar, or axillary lymph nodes. The thyroid gland and trachea demonstrate no significant findings. A dilated, fluid-filled esophagus is seen. Lungs/Pleura: A single left-sided pleural drainage catheter is seen with its distal end extending along the anterior aspect of the left lung base. Its distal tip is seen just below the anterior medial aspect of the right atrium and right ventricle.  Marked severity infiltrate is seen within the right lower lobe. This is increased in severity when compared to the prior study. Mild to moderate severity involvement of the adjacent portion of the left upper lobe is seen. Multiple small, ill-defined cavitary lung lesions are seen scattered throughout the right upper lobe and superior aspect of the left lower lobe. These are new when compared to the prior study. Multiple small, ill-defined nodular appearing areas are seen scattered throughout the right middle lobe. There is a small left pleural effusion which is decreased in size when compared to the prior exam. This contains a few tiny foci of air within the lateral aspect of the left lung base. A mild to moderate amount of air is seen within the anterior aspect of the mediastinum. Upper Abdomen: There is a small hiatal hernia. The liver is cirrhotic in appearance. A large amount of abdominal free fluid is seen. Musculoskeletal: A mild to moderate amount of soft tissue air is seen along the lateral aspects of the upper chest wall, bilaterally. This extends to involve the soft tissues of the neck on the left. No acute or significant osseous findings. IMPRESSION: 1. Marked severity left lower lobe infiltrate with mild to moderate severity infiltrate within the adjacent portion of the left upper lobe. 2. Multiple, new ill-defined cavitary lung lesions involving predominantly the right upper lobe and left lower lobe which is concerning for sequelae associated with septic emboli. 3. Left-sided pleural drainage catheter positioning, as described above. 4. Small left pleural effusion which contains a tiny amount of pleural air and is decreased in size when compared to the prior study. 5. Small to moderate severity anterior pneumomediastinum. 6. Cirrhotic liver with a large amount of ascites. Electronically Signed   By: TVirgina NorfolkM.D.   On: 07/18/2020 02:29   [place]domen Complete  Result Date: 06/22/2020 CLINICAL  DATA:  Hepatic cirrhosis.  Esophageal carcinoma EXAM: ABDOMEN ULTRASOUND COMPLETE  COMPARISON:  Ultrasound right upper quadrant May 18, 2020; CT abdomen and pelvis May 16, 2020 FINDINGS: Gallbladder: Gallbladder wall is thickened diffusely and contracted. Suggestion of ring down type artifact from the gallbladder wall may indicate inflammation such as adenomyomatosis or cholesterolosis. No gallstones evident. No pericholecystic fluid. No sonographic Murphy sign noted by sonographer. Common bile duct: Diameter: 2 mm. No intrahepatic, common hepatic, or common bile duct dilatation. Liver: No focal lesion identified. The liver has a diffusely nodular contour. The liver is overall diminished in size. The liver echogenicity is increased and coarsened. Portal vein is patent on color Doppler imaging with normal direction of blood flow towards the liver. IVC: No abnormality visualized in regions that can be interrogated. Visualized portions of inferior vena cava appear unremarkable. Pancreas: Visualized portion unremarkable. Portions of pancreas obscured by gas. Spleen: Size and appearance within normal limits. Right Kidney: Length: 9.6 cm. Echogenicity within normal limits. No mass or hydronephrosis visualized. Left Kidney: Length: 10.4 cm. Echogenicity within normal limits. No mass or hydronephrosis visualized. Abdominal aorta: No aneurysm visualized. Other findings: There is moderate diffuse ascites. Right pleural effusion noted. IMPRESSION: 1. The appearance of the liver is consistent with cirrhosis. No focal liver lesions evident. Note that the sensitivity of ultrasound for detection of focal liver lesions is diminished in this circumstance. 2. Gallbladder is contracted with thickened wall. Ascites and hepatic cirrhosis may result in gallbladder wall thickening. No gallstones are evident. Note that there is a degree of ring down type artifact suggesting underlying adenomatosis or cholesterolosis within the  gallbladder. 3. Portions of inferior vena cava and pancreas obscured by gas. Visualized portions of the structures appear normal. 4.  Splenic size within normal limits. 5.  Ascites.  Right pleural effusion noted. Electronically Signed   By: Lowella Grip III M.D.   On: 06/22/2020 14:56   IR Paracentesis  Result Date: 06/23/2020 INDICATION: Patient with history of GE junction adenocarcinoma with recent development of ascites. Patient presents for diagnostic and therapeutic paracentesis. EXAM: ULTRASOUND GUIDED DIAGNOSTIC AND THERAPEUTIC PARACENTESIS MEDICATIONS: 10 mL 1% lidocaine COMPLICATIONS: None immediate. PROCEDURE: Informed written consent was obtained from the patient after a discussion of the risks, benefits and alternatives to treatment. A timeout was performed prior to the initiation of the procedure. Initial ultrasound scanning demonstrates a large amount of ascites within the right lower abdominal quadrant. The right lower abdomen was prepped and draped in the usual sterile fashion. 1% lidocaine was used for local anesthesia. Following this, a 19 gauge, 7-cm, Yueh catheter was introduced. An ultrasound image was saved for documentation purposes. The paracentesis was performed. The catheter was removed and a dressing was applied. The patient tolerated the procedure well without immediate post procedural complication. FINDINGS: A total of approximately 5.1 liters of yellow fluid was removed. Samples were sent to the laboratory as requested by the clinical team. IMPRESSION: Successful ultrasound-guided paracentesis yielding 5.1 liters of peritoneal fluid. Read by: Brynda Greathouse PA-C Electronically Signed   By: Corrie Mckusick D.O.   On: 06/23/2020 15:57   Assessment and Plan:  1.  Metastatic adenocarcinoma the GE junction 2.  Neutropenia secondary to recent chemotherapy 3.  Thrombocytopenia secondary to recent chemotherapy 4.  Pneumonia 5.  Ill-defined cavitary lung lesions - ?sequelae  associated with septic emboli 6.  Ascites 7.  Cirrhosis noted on CT scan 8.  Protein calorie malnutrition 9.  Hyperbilirubinemia  -The patient is currently receiving chemotherapy for metastatic adenocarcinoma the GE junction.  He has developed neutropenia and thrombocytopenia  secondary to recent chemotherapy.  We will start him on Granix 300 mcg subcu daily x3 days to get ANC above 1.5. -Monitor platelet count.  No need for platelet transfusion with platelet count less than 20,000 or active bleeding. -Recommend daily CBC with differential. -Antibiotics per hospitalist for pneumonia. -The patient was noted to have ill-defined cavitary lung lesions on CT scan.?  Sequelae associated with septic emboli.  Will obtain an echocardiogram to further evaluate for endocarditis. -We will consider ultrasound-guided paracentesis this admission once more stable. -Recommend dietitian consult. -Further recommendations per [name].  Mikey Bussing, DNP, AGPCNP-BC, AOCNP  ADDENDUM: I saw and examined Mr. Matthew Reynolds this morning.  Unfortunately, I think we are at a situation that we have to change our priorities.  I just think that he is too weak to take any more treatment.  He became neutropenic.  This is only with weekly Taxol along with Cyramza.  He has a bad pneumonia.  He has septic emboli.  We certainly have to check his heart to see if there is any endocarditis that could be causing some of this.  I am going to check his prealbumin to see how low this is.  I had believe that is going to be less than 10.  No weight loss has been dramatic.  His weight is holding steady compared to what we saw a week or so ago.  Again this pneumonia with septic emboli is clearly a huge problem.  I have not talked to him yet about Hospice.  I want to see with the prealbumin is before we have that talk.  Again, his status is really declined over the past month or so.  I know he is trying everything that he can do.  I  just think that his body is becoming weaker and is just not going to tolerate any further therapy.  I think Dr. Roxan Hockey is supposed  to see him in the hospital and will possibly remove the Pleurx catheter.  I know that he will get outstanding care from all staff on 6 E.     Lattie Haw, MD  2 Timothy 4:6-8

## 2020-07-18 NOTE — Sepsis Progress Note (Signed)
Following for sepsis monitoring ?

## 2020-07-19 ENCOUNTER — Inpatient Hospital Stay (HOSPITAL_COMMUNITY): Payer: BC Managed Care – PPO

## 2020-07-19 ENCOUNTER — Ambulatory Visit: Payer: BC Managed Care – PPO | Admitting: Thoracic Surgery (Cardiothoracic Vascular Surgery)

## 2020-07-19 DIAGNOSIS — J189 Pneumonia, unspecified organism: Secondary | ICD-10-CM | POA: Diagnosis not present

## 2020-07-19 DIAGNOSIS — R1310 Dysphagia, unspecified: Secondary | ICD-10-CM

## 2020-07-19 DIAGNOSIS — I2690 Septic pulmonary embolism without acute cor pulmonale: Secondary | ICD-10-CM

## 2020-07-19 DIAGNOSIS — D701 Agranulocytosis secondary to cancer chemotherapy: Secondary | ICD-10-CM

## 2020-07-19 DIAGNOSIS — T451X5A Adverse effect of antineoplastic and immunosuppressive drugs, initial encounter: Secondary | ICD-10-CM

## 2020-07-19 DIAGNOSIS — D708 Other neutropenia: Secondary | ICD-10-CM

## 2020-07-19 DIAGNOSIS — J984 Other disorders of lung: Secondary | ICD-10-CM

## 2020-07-19 DIAGNOSIS — C159 Malignant neoplasm of esophagus, unspecified: Secondary | ICD-10-CM

## 2020-07-19 DIAGNOSIS — C160 Malignant neoplasm of cardia: Secondary | ICD-10-CM

## 2020-07-19 DIAGNOSIS — R0602 Shortness of breath: Secondary | ICD-10-CM

## 2020-07-19 DIAGNOSIS — E46 Unspecified protein-calorie malnutrition: Secondary | ICD-10-CM

## 2020-07-19 LAB — ECHOCARDIOGRAM COMPLETE
AR max vel: 1.96 cm2
AV Area VTI: 2.22 cm2
AV Area mean vel: 2.01 cm2
AV Mean grad: 2 mmHg
AV Peak grad: 3.6 mmHg
Ao pk vel: 0.94 m/s
Area-P 1/2: 2.34 cm2
Height: 69 in
MV VTI: 1.36 cm2
S' Lateral: 2.2 cm
Weight: 1985.6 oz

## 2020-07-19 LAB — CBC
HCT: 38.5 % — ABNORMAL LOW (ref 39.0–52.0)
Hemoglobin: 12.5 g/dL — ABNORMAL LOW (ref 13.0–17.0)
MCH: 28.7 pg (ref 26.0–34.0)
MCHC: 32.5 g/dL (ref 30.0–36.0)
MCV: 88.5 fL (ref 80.0–100.0)
Platelets: UNDETERMINED 10*3/uL (ref 150–400)
RBC: 4.35 MIL/uL (ref 4.22–5.81)
RDW: 14.7 % (ref 11.5–15.5)
WBC: 1.7 10*3/uL — ABNORMAL LOW (ref 4.0–10.5)
nRBC: 0 % (ref 0.0–0.2)

## 2020-07-19 LAB — BASIC METABOLIC PANEL
Anion gap: 6 (ref 5–15)
BUN: 31 mg/dL — ABNORMAL HIGH (ref 6–20)
CO2: 27 mmol/L (ref 22–32)
Calcium: 8.2 mg/dL — ABNORMAL LOW (ref 8.9–10.3)
Chloride: 109 mmol/L (ref 98–111)
Creatinine, Ser: 0.73 mg/dL (ref 0.61–1.24)
GFR, Estimated: >60 mL/min (ref >60)
Glucose, Bld: 87 mg/dL (ref 70–99)
Potassium: 4.4 mmol/L (ref 3.5–5.1)
Sodium: 142 mmol/L (ref 135–145)

## 2020-07-19 LAB — CRYPTOCOCCAL ANTIGEN: Crypto Ag: NEGATIVE

## 2020-07-19 LAB — PREALBUMIN: Prealbumin: <5 mg/dL — ABNORMAL LOW (ref 18–38)

## 2020-07-19 MED ORDER — ENSURE ENLIVE PO LIQD
237.0000 mL | Freq: Three times a day (TID) | ORAL | Status: DC
  Administered 2020-07-20: 237 mL via ORAL

## 2020-07-19 MED ORDER — MORPHINE SULFATE (PF) 2 MG/ML IV SOLN
2.0000 mg | INTRAVENOUS | Status: DC | PRN
  Administered 2020-07-19 – 2020-07-20 (×9): 2 mg via INTRAVENOUS
  Filled 2020-07-19 (×9): qty 1

## 2020-07-19 MED ORDER — VANCOMYCIN HCL 1250 MG/250ML IV SOLN
1250.0000 mg | Freq: Once | INTRAVENOUS | Status: AC
  Administered 2020-07-19: 1250 mg via INTRAVENOUS
  Filled 2020-07-19: qty 250

## 2020-07-19 MED ORDER — VANCOMYCIN HCL 750 MG/150ML IV SOLN
750.0000 mg | Freq: Two times a day (BID) | INTRAVENOUS | Status: DC
  Administered 2020-07-19 – 2020-07-20 (×2): 750 mg via INTRAVENOUS
  Filled 2020-07-19 (×3): qty 150

## 2020-07-19 NOTE — Consult Note (Signed)
Reason for Consult:Fever Referring Physician: Dr. Kirt Boys- Oncology  Matthew Reynolds is an 55 y.o. male.  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  Admitted with cough and shortness of breath. Also increased dysphagia and only able to take liquids. Severely leukopenic. CT showed left lower lobe consolidation and a cavitary lesion in the right lung. Small pleural effusion and some residual pneumomediastinum. SQ emphysema. Started on Maxipime for pneumonia.  I did a left VATS for pleural and pericardial biopsies and placement of a pleural catheter in March. He has been draining the catheter once a week with minimal output.    Past Medical History:  Diagnosis Date  . Adenocarcinoma of cardio-esophageal junction ([place]) 12/10/2017  . Goals of care, counseling/discussion 12/10/2017  . Iron deficiency anemia due to chronic blood loss 12/11/2017  . Malignant neoplasm metastatic to liver ([place]) 12/10/2017  . 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 CHEST TUBE INSERTION Left 06/05/2020   Procedure: INSERTION PLEURAL DRAINAGE CATHETER;  Surgeon: [name], MD;  Location: [facility];  Service: Thoracic;  Laterality: Left;  . CYST EXCISION Left 01/15/2016   Procedure: EXCISION SEBACEOUS CYST LEFT UPPER BACK;  Surgeon: Georganna Skeans, MD;  Location: [employer];  Service: General;  Laterality: Left;  . IR IMAGING GUIDED PORT INSERTION  12/15/2017  . IR PARACENTESIS  06/23/2020  . PERICARDIAL WINDOW N/A 06/05/2020   Procedure: PERICARDIAL WINDOW;  Surgeon: [name], MD;  Location: [facility];  Service: Thoracic;  Laterality: N/A;  . PLEURAL BIOPSY N/A 06/05/2020   Procedure: PLEURAL BIOPSY;  Surgeon: [name], MD;  Location: [facility];  Service: Thoracic;  Laterality: N/A;  . THORACENTESIS Left 05/17/2020   Procedure: THORACENTESIS;   Surgeon: Spero Geralds, MD;  Location: [facility];  Service: Pulmonary;  Laterality: Left;  Marland Kitchen VIDEO ASSISTED THORACOSCOPY Left 06/05/2020   Procedure: VIDEO ASSISTED THORACOSCOPY;  Surgeon: [name], MD;  Location: [facility];  Service: Thoracic;  Laterality: Left;    History reviewed. No pertinent family history.  Social History:  reports that he has never smoked. He has never used smokeless tobacco. He reports previous alcohol use. He reports that he does not use drugs.  Allergies: No Known Allergies  Medications:  Scheduled: . Chlorhexidine Gluconate Cloth  6 each Topical Daily  . docusate sodium  100 mg Oral BID  . dronabinol  2.5 mg Oral BID AC  . enoxaparin (LOVENOX) injection  40 mg Subcutaneous Q24H  . fentaNYL  1 patch Transdermal Q72H  . lidocaine-prilocaine  1 application Topical Once  . sodium chloride flush  10-40 mL Intracatheter Q12H  . sodium chloride flush  3 mL Intravenous Q12H  . Tbo-filgastrim (GRANIX) SQ  300 mcg Subcutaneous q1800    Results for orders placed or performed during the hospital encounter of 07/17/20 (from the past 48 hour(s))  CBC with Differential     Status: Abnormal   Collection Time: 07/18/20 12:05 AM  Result Value Ref Range   WBC 1.0 (LL) 4.0 - 10.5 K/uL    Comment: REPEATED TO VERIFY THIS CRITICAL RESULT HAS VERIFIED AND BEEN CALLED TO KELLIE NEAL RN BY SHEA REQUEJO CROFT ON 05 03 2022 AT 0121, AND HAS BEEN READ BACK.     RBC 4.92 4.22 - 5.81 MIL/uL   Hemoglobin 14.2 13.0 - 17.0 g/dL   HCT 43.5  39.0 - 52.0 %   MCV 88.4 80.0 - 100.0 fL   MCH 28.9 26.0 - 34.0 pg   MCHC 32.6 30.0 - 36.0 g/dL   RDW 14.6 11.5 - 15.5 %   Platelets 79 (L) 150 - 400 K/uL    Comment: Immature Platelet Fraction may be clinically indicated, consider ordering this additional test [id number] REPEATED TO VERIFY PLATELET COUNT CONFIRMED BY SMEAR    nRBC 0.0 0.0 - 0.2 %   Neutrophils Relative % 66 %   Neutro Abs 0.7 (L) 1.7 - 7.7 K/uL   Lymphocytes  Relative 21 %   Lymphs Abs 0.2 (L) 0.7 - 4.0 K/uL   Monocytes Relative 6 %   Monocytes Absolute 0.1 0.1 - 1.0 K/uL   Eosinophils Relative 0 %   Eosinophils Absolute 0.0 0.0 - 0.5 K/uL   Basophils Relative 5 %   Basophils Absolute 0.1 0.0 - 0.1 K/uL   RBC Morphology MORPHOLOGY UNREMARKABLE    Smear Review MORPHOLOGY UNREMARKABLE    Immature Granulocytes 2 %   Abs Immature Granulocytes 0.02 0.00 - 0.07 K/uL    Comment: Performed at [facility], [address] [place], [place] [address]  Comprehensive metabolic panel     Status: Abnormal   Collection Time: 07/18/20 12:05 AM  Result Value Ref Range   Sodium 138 135 - 145 mmol/L   Potassium 5.1 3.5 - 5.1 mmol/L   Chloride 102 98 - 111 mmol/L   CO2 27 22 - 32 mmol/L   Glucose, Bld 112 (H) 70 - 99 mg/dL    Comment: Glucose reference range applies only to samples taken after fasting for at least 8 hours.   BUN 28 (H) 6 - 20 mg/dL   Creatinine, Ser 0.65 0.61 - 1.24 mg/dL   Calcium 8.2 (L) 8.9 - 10.3 mg/dL   Total Protein 5.3 (L) 6.5 - 8.1 g/dL   Albumin 2.1 (L) 3.5 - 5.0 g/dL   AST 35 15 - 41 U/L   ALT 30 0 - 44 U/L   Alkaline Phosphatase 189 (H) 38 - 126 U/L   Total Bilirubin 2.1 (H) 0.3 - 1.2 mg/dL   GFR, Estimated >60 >60 mL/min    Comment: (NOTE) Calculated using the CKD-EPI Creatinine Equation (2021)    Anion gap 9 5 - 15    Comment: Performed at [facility], [address] [place], [place] [address]  Troponin I (High Sensitivity)     Status: None   Collection Time: 07/18/20 12:05 AM  Result Value Ref Range   Troponin I (High Sensitivity) 11 <18 ng/L    Comment: (NOTE) Elevated high sensitivity troponin I (hsTnI) values and significant  changes across serial measurements may suggest ACS but many other  chronic and acute conditions are known to elevate hsTnI results.  Refer to the "Links" section for chest pain algorithms and additional  guidance. Performed at [facility], [address] [place], [place] [address]   Resp Panel by RT-PCR (Flu A&B, Covid) Nasopharyngeal Swab     Status: None   Collection Time: 07/18/20 12:05 AM   Specimen: Nasopharyngeal Swab; Nasopharyngeal(NP) swabs in vial transport medium  Result Value Ref Range   SARS Coronavirus 2 by RT PCR NEGATIVE NEGATIVE    Comment: (NOTE) SARS-CoV-2 target nucleic acids are NOT DETECTED.  The SARS-CoV-2 RNA is generally detectable in upper respiratory specimens during the acute phase of infection. The lowest concentration of SARS-CoV-2 viral copies  this assay can detect is 138 copies/mL. A negative result does not preclude SARS-Cov-2 infection and should not be used as the sole basis for treatment or other patient management decisions. A negative result may occur with  improper specimen collection/handling, submission of specimen other than nasopharyngeal swab, presence of viral mutation(s) within the areas targeted by this assay, and inadequate number of viral copies(<138 copies/mL). A negative result must be combined with clinical observations, patient history, and epidemiological information. The expected result is Negative.  Fact Sheet for Patients:  EntrepreneurPulse.com.au  Fact Sheet for Healthcare Providers:  IncredibleEmployment.be  This test is no t yet approved or cleared by the [place] FDA and  has been authorized for detection and/or diagnosis of SARS-CoV-2 by FDA under an Emergency Use Authorization (EUA). This EUA will remain  in effect (meaning this test can be used) for the duration of the COVID-19 declaration under Section 564(b)(1) of the Act, 21 U.S.C.section 360bbb-3(b)(1), unless the authorization is terminated  or revoked sooner.       Influenza A by PCR NEGATIVE NEGATIVE   Influenza B by PCR NEGATIVE NEGATIVE    Comment: (NOTE) The Xpert Xpress SARS-CoV-2/FLU/RSV plus assay is intended as an aid in the diagnosis of influenza  from Nasopharyngeal swab specimens and should not be used as a sole basis for treatment. Nasal washings and aspirates are unacceptable for Xpert Xpress SARS-CoV-2/FLU/RSV testing.  Fact Sheet for Patients: EntrepreneurPulse.com.au  Fact Sheet for Healthcare Providers: IncredibleEmployment.be  This test is not yet approved or cleared by the [place] FDA and has been authorized for detection and/or diagnosis of SARS-CoV-2 by FDA under an Emergency Use Authorization (EUA). This EUA will remain in effect (meaning this test can be used) for the duration of the COVID-19 declaration under Section 564(b)(1) of the Act, 21 U.S.C. section 360bbb-3(b)(1), unless the authorization is terminated or revoked.  Performed at [facility], [address] [place], [place] [address]   Lactic acid, plasma     Status: None   Collection Time: 07/18/20 12:40 AM  Result Value Ref Range   Lactic Acid, Venous 1.2 0.5 - 1.9 mmol/L    Comment: Performed at [facility], [address] [place], [place] [address]  Troponin I (High Sensitivity)     Status: None   Collection Time: 07/18/20  2:02 AM  Result Value Ref Range   Troponin I (High Sensitivity) 11 <18 ng/L    Comment: (NOTE) Elevated high sensitivity troponin I (hsTnI) values and significant  changes across serial measurements may suggest ACS but many other  chronic and acute conditions are known to elevate hsTnI results.  Refer to the "Links" section for chest pain algorithms and additional  guidance. Performed at [facility], [address] [place], [place] [address]   MRSA PCR Screening     Status: None   Collection Time: 07/18/20 12:00 PM   Specimen: Nasopharyngeal  Result Value Ref Range   MRSA by PCR NEGATIVE NEGATIVE    Comment:        The GeneXpert MRSA Assay (FDA approved for NASAL specimens only), is one component of a comprehensive MRSA  colonization surveillance program. It is not intended to diagnose MRSA infection nor to guide or monitor treatment for MRSA infections. Performed at [facility], [place] [address], [place], [place] [address]   Basic metabolic panel     Status: Abnormal   Collection Time: 07/19/20  5:00 AM  Result Value Ref Range   Sodium 142 135 - 145 mmol/L   Potassium 4.4 3.5 - 5.1 mmol/L   Chloride 109 98 - 111 mmol/L   CO2 27 22 - 32 mmol/L   Glucose, Bld 87 70 - 99 mg/dL    Comment: Glucose reference range applies only to samples taken after fasting for at least 8 hours.   BUN 31 (H) 6 - 20 mg/dL   Creatinine, Ser 0.73 0.61 - 1.24 mg/dL   Calcium 8.2 (L) 8.9 - 10.3 mg/dL   GFR, Estimated >60 >60 mL/min    Comment: (NOTE) Calculated using the CKD-EPI Creatinine Equation (2021)    Anion gap 6 5 - 15    Comment: Performed at [facility] [address], [place], [place] [address]  CBC     Status: Abnormal   Collection Time: 07/19/20  5:00 AM  Result Value Ref Range   WBC 1.7 (L) 4.0 - 10.5 K/uL   RBC 4.35 4.22 - 5.81 MIL/uL   Hemoglobin 12.5 (L) 13.0 - 17.0 g/dL   HCT 38.5 (L) 39.0 - 52.0 %   MCV 88.5 80.0 - 100.0 fL   MCH 28.7 26.0 - 34.0 pg   MCHC 32.5 30.0 - 36.0 g/dL   RDW 14.7 11.5 - 15.5 %   Platelets PLATELET CLUMPS NOTED ON SMEAR, UNABLE TO ESTIMATE 150 - 400 K/uL    Comment: Immature Platelet Fraction may be clinically indicated, consider ordering this additional test [id number]    nRBC 0.0 0.0 - 0.2 %    Comment: Performed at [facility], [place] [address], [place], [place] [address]    DG Chest 2 View  Result Date: 07/17/2020 CLINICAL DATA:  Cough and shortness of breath for 1 day, esophageal carcinoma EXAM: CHEST - 2 VIEW COMPARISON:  07/04/2020 FINDINGS: Frontal and lateral views of the chest demonstrate stable right chest wall port and left-sided pleural drainage catheter. There has been interval resolution of the pneumomediastinum, with near complete  resolution of subcutaneous emphysema seen previously. Cardiac silhouette is unremarkable. There is multifocal bilateral airspace disease, greatest in the left lower lobe. Small left pleural effusion is unchanged. No pneumothorax. No acute bony abnormalities. IMPRESSION: 1. Progressive multifocal bilateral airspace disease. 2. Small left effusion. 3. Resolution of the pneumomediastinum seen previously. Near complete resolution of the chest wall subcutaneous emphysema. 4. Support devices as above. Electronically Signed   By: Randa Ngo M.D.   On: 07/17/2020 23:56   CT Chest W Contrast  Result Date: 07/18/2020 CLINICAL DATA:  History of pericardial window and pleural biopsy on June 05, 2020, presenting with productive cough and shortness of breath. EXAM: CT CHEST WITH CONTRAST TECHNIQUE: Multidetector CT imaging of the chest was performed during intravenous contrast administration. CONTRAST:  25mL OMNIPAQUE IOHEXOL 300 MG/ML  SOLN COMPARISON:  May 16, 2020 FINDINGS: Cardiovascular: A right-sided venous Port-A-Cath is in place. The thoracic aorta is normal in appearance without evidence of aneurysmal dilatation or dissection. The pulmonary arteries are normal in appearance, without evidence of intraluminal filling defects. Normal heart size. Trace amount of pericardial fluid is noted. Mediastinum/Nodes: No enlarged mediastinal, hilar, or axillary lymph nodes. The thyroid gland and trachea demonstrate no significant findings. A dilated, fluid-filled esophagus is seen. Lungs/Pleura: A single left-sided pleural drainage catheter is seen with its distal end extending along the anterior aspect of the left lung base. Its distal tip is seen just below the anterior medial aspect of the right atrium and right ventricle.  Marked severity infiltrate is seen within the right lower lobe. This is increased in severity when compared to the prior study. Mild to moderate severity involvement of the adjacent portion of the left  upper lobe is seen. Multiple small, ill-defined cavitary lung lesions are seen scattered throughout the right upper lobe and superior aspect of the left lower lobe. These are new when compared to the prior study. Multiple small, ill-defined nodular appearing areas are seen scattered throughout the right middle lobe. There is a small left pleural effusion which is decreased in size when compared to the prior exam. This contains a few tiny foci of air within the lateral aspect of the left lung base. A mild to moderate amount of air is seen within the anterior aspect of the mediastinum. Upper Abdomen: There is a small hiatal hernia. The liver is cirrhotic in appearance. A large amount of abdominal free fluid is seen. Musculoskeletal: A mild to moderate amount of soft tissue air is seen along the lateral aspects of the upper chest wall, bilaterally. This extends to involve the soft tissues of the neck on the left. No acute or significant osseous findings. IMPRESSION: 1. Marked severity left lower lobe infiltrate with mild to moderate severity infiltrate within the adjacent portion of the left upper lobe. 2. Multiple, new ill-defined cavitary lung lesions involving predominantly the right upper lobe and left lower lobe which is concerning for sequelae associated with septic emboli. 3. Left-sided pleural drainage catheter positioning, as described above. 4. Small left pleural effusion which contains a tiny amount of pleural air and is decreased in size when compared to the prior study. 5. Small to moderate severity anterior pneumomediastinum. 6. Cirrhotic liver with a large amount of ascites. Electronically Signed   By: Virgina Norfolk M.D.   On: 07/18/2020 02:29   I personally reviewed the CT images. There is a small pleural effusion Review of Systems  Constitutional: Positive for activity change, appetite change and fatigue.  HENT: Positive for trouble swallowing and voice change.   Respiratory: Positive for  cough and shortness of breath.   Gastrointestinal: Positive for vomiting.   Blood pressure 112/70, pulse 91, temperature 97.7 F (36.5 C), temperature source Oral, resp. rate 17, height 5\' 9"  (1.753 m), weight 56.3 kg, SpO2 98 %. Physical Exam Vitals reviewed.  Constitutional:      General: He is not in acute distress.    Appearance: He is ill-appearing.  HENT:     Head: Normocephalic and atraumatic.  Eyes:     Extraocular Movements: Extraocular movements intact.  Cardiovascular:     Rate and Rhythm: Normal rate and regular rhythm.     Heart sounds: Normal heart sounds. No murmur heard.   Pulmonary:     Breath sounds: No wheezing or rhonchi.     Comments: Diminished BS in bases L > R Musculoskeletal:     Cervical back: Neck supple.  Skin:    General: Skin is warm and dry.  Neurological:     General: No focal deficit present.     Mental Status: He is alert and oriented to person, place, and time.     Cranial Nerves: No cranial nerve deficit.     Motor: No weakness.     Gait: Gait normal.    Assessment/Plan: 55 yo man with stage IV adenocarcinoma of the GE junction with liver, pleural and pericardial mets. Admitted with shortness of breath, cough, emesis and failure to thrive. Dysphagia for solid foods. CT showed a left  lower lobe pneumonia and cavitary lesion in right lung. Started on Maxipime for pneumonia.  He has had a good result with pericardial window, pleural catheter with no pericardial effusion and minimal left pleural effusion. I strongly suspect the pneumomediatinum and SQ emphysema are residual from his surgery a month ago when they were much more pronounced.   Recommend draining pleural catheter today. May consider removal depending on output and plan of care going forward.   [name] 07/19/2020, 8:01 AM

## 2020-07-19 NOTE — Progress Notes (Signed)
Palliative:  Consult received and chart reviewed. Briefly spoke with Mrs. Kubicki via telephone. Introduced role of palliative care. Mr. And Mrs. Nuzum ask I hold off on seeing them today - they would like more information from the medical team prior to our meeting. They are agreeable to following up tomorrow.   Juel Burrow, DNP, AGNP-C Palliative Medicine Team Team Phone # [phone]  Pager # [phone]  NO CHARGE

## 2020-07-19 NOTE — Progress Notes (Signed)
Pharmacy Antibiotic Note  Matthew Reynolds is a 55 y.o. male admitted on 07/17/2020. Patient has stage IV esophageal cancer, currently receiving chemotherapy. Admitted with cough, shortness of breath, increased dysphagia. Severely leukopenic. CT showed left lower lobe consolidation and a cavitary lesion in the right lung concerning for septic emboli. Pharmacy has been consulted for cefepime and vancomycin dosing.  MRSA PCR negative 5/3, but adding vancomycin for now until cultures result. Concerned for septic emboli, possible endocarditis - echo ordered. Sputum cultures to be done.   Plan: Vancomycin 1250 mg x1 followed by 750 mg q12h (eAUC 499, Scr used 0.8) Continue cefepime 2g IV q8h Monitor cultures, echo results, clinical progress  Height: 5\' 9"  (175.3 cm) Weight: 56.3 kg (124 lb 1.6 oz) IBW/kg (Calculated) : 70.7  Temp (24hrs), Avg:98 F (36.7 C), Min:97.7 F (36.5 C), Max:98.1 F (36.7 C)  Recent Labs  Lab 07/13/20 1001 07/18/20 0005 07/18/20 0040 07/19/20 0500  WBC 3.2* 1.0*  --  1.7*  CREATININE 0.68 0.65  --  0.73  LATICACIDVEN  --   --  1.2  --     Estimated Creatinine Clearance: 84.1 mL/min (by C-G formula based on SCr of 0.73 mg/dL).    No Known Allergies  Antimicrobials this admission: 5/3  cefepime >>  53 vancomycin x1, 5/4 >>   Dose adjustments this admission:   Microbiology results: 5/3 BCx: NG<24h 5/4 Sputum: collecting  5/3 MRSA PCR: negative  Thank you for allowing pharmacy to be a part of this patient's care.  Rebbeca Paul, PharmD PGY1 Pharmacy Resident 07/19/2020 9:45 AM  Please check [email or web address] for unit-specific pharmacy phone numbers.

## 2020-07-19 NOTE — Progress Notes (Signed)
  Echocardiogram 2D Echocardiogram has been performed.  Matthew Reynolds 07/19/2020, 5:44 PM

## 2020-07-19 NOTE — Evaluation (Signed)
Clinical/Bedside Swallow Evaluation Patient Details  Name: Matthew Reynolds MRN: [id number] Date of Birth: 20-Sep-1965  Today's Date: 07/19/2020 Time: SLP Start Time (ACUTE ONLY): 4098 SLP Stop Time (ACUTE ONLY): 1191 SLP Time Calculation (min) (ACUTE ONLY): 29 min  Past Medical History:  Past Medical History:  Diagnosis Date  . 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 Goals of care, counseling/discussion 12/10/2017  . Iron deficiency anemia due to chronic blood loss 12/11/2017  . Malignant neoplasm metastatic to liver ([place]) 12/10/2017  . 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 CHEST TUBE INSERTION Left 06/05/2020   Procedure: INSERTION PLEURAL DRAINAGE CATHETER;  Surgeon: [name], MD;  Location: [facility];  Service: Thoracic;  Laterality: Left;  . CYST EXCISION Left 01/15/2016   Procedure: EXCISION SEBACEOUS CYST LEFT UPPER BACK;  Surgeon: Georganna Skeans, MD;  Location: [facility];  Service: General;  Laterality: Left;  . IR IMAGING GUIDED PORT INSERTION  12/15/2017  . IR PARACENTESIS  06/23/2020  . PERICARDIAL WINDOW N/A 06/05/2020   Procedure: PERICARDIAL WINDOW;  Surgeon: [name], MD;  Location: [place];  Service: Thoracic;  Laterality: N/A;  . PLEURAL BIOPSY N/A 06/05/2020   Procedure: PLEURAL BIOPSY;  Surgeon: [name], MD;  Location: [facility];  Service: Thoracic;  Laterality: N/A;  . THORACENTESIS Left 05/17/2020   Procedure: THORACENTESIS;  Surgeon: Spero Geralds, MD;  Location: [facility] ENDOSCOPY;  Service: Pulmonary;  Laterality: Left;  Marland Kitchen VIDEO ASSISTED THORACOSCOPY Left 06/05/2020   Procedure: VIDEO ASSISTED THORACOSCOPY;  Surgeon: [name], MD;  Location: [facility] OR;  Service: Thoracic;  Laterality: Left;   HPI:  Pt is a 55 yo male admitted with sepsis secondary to PNA. 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 He has recently been having difficulties with weight loss, dysphagia to solids/pills, and vomiting. Pt has previously undergone chemo and XRT for esophageal ca. PMH also includes Barrett's esophagus   Assessment / Plan / Recommendation Clinical Impression  Pt presents with functional appearing oral pharyngeal swallowing, with symptoms that appear to be more consistent with an esophageal dysphagia particularly when considering his esophageal hx. He describes a fairly quick progression of not being able to tolerate anything other than thin liquids (even slightly thicker liquids), and even with thin liquids he can take only a few small sips at a time. He has regurgitation of thin, frothy secretions throughout the day, which he and his wife report to be at least tinged with other POs when it occurs during meal times. His risk for aspiration may be highest post-prandially, but there are no overt s/s of aspiration observed during skilled observation today. Recommend continuing with full liquid diet as initiated by MD, with several aspiration and esophageal precautions reviewed with the pt and his wife (although he has also been implementing many of this at home already). SLP will continue to follow acutely for ongoing assessment. If further assessment is indicated, it may be more revealing to consider a dedicated esophageal assessment as opposed to MBS. SLP Visit Diagnosis: Dysphagia, unspecified (R13.10)    Aspiration Risk  Mild aspiration risk;Risk for inadequate nutrition/hydration    Diet Recommendation Thin liquid (full liquid diet)   Liquid Administration via: Cup;Straw Medication Administration: Other (Comment) (liquid formulary) Supervision: Patient able to self feed;Intermittent supervision to cue for compensatory strategies Compensations: Slow rate;Small sips/bites Postural Changes: Seated upright at  90 degrees;Remain upright for at least 30 minutes after po  intake    Other  Recommendations Recommended Consults: Consider esophageal assessment Oral Care Recommendations: Oral care QID Other Recommendations: Have oral suction available   Follow up Recommendations None      Frequency and Duration min 2x/week  1 week       Prognosis Prognosis for Safe Diet Advancement: Fair Barriers to Reach Goals: Severity of deficits;Other (Comment) (overall medical prognosis)      Swallow Study   General HPI: Pt is a 55 yo male admitted with sepsis secondary to PNA. He is currently undergoing chemo for metastatic adenocarcinoma of the GE junction. He has recently been having difficulties with weight loss, dysphagia to solids/pills, and vomiting. Pt has previously undergone chemo and XRT for esophageal ca. PMH also includes Barrett's esophagus Type of Study: Bedside Swallow Evaluation Previous Swallow Assessment: none in cahrt Diet Prior to this Study: Thin liquids (full liquid diet) Temperature Spikes Noted: No Respiratory Status: Room air History of Recent Intubation: No Behavior/Cognition: Alert;Cooperative;Pleasant mood Oral Cavity Assessment: Excessive secretions (thin, frothy) Oral Care Completed by SLP: No Oral Cavity - Dentition: Adequate natural dentition Vision: Functional for self-feeding Self-Feeding Abilities: Able to feed self Patient Positioning: Upright in bed Baseline Vocal Quality: Low vocal intensity Volitional Swallow: Able to elicit    Oral/Motor/Sensory Function Overall Oral Motor/Sensory Function: Within functional limits   Ice Chips Ice chips: Not tested   Thin Liquid Thin Liquid: Within functional limits Presentation: Self Fed;Straw    Nectar Thick Nectar Thick Liquid: Not tested   Honey Thick Honey Thick Liquid: Not tested   Puree Puree: Not tested   Solid     Solid: Not tested      Osie Bond., M.A. [facility] Pager [phone] Office [phone]  07/19/2020,4:17 PM

## 2020-07-19 NOTE — Progress Notes (Signed)
Initial Nutrition Assessment  DOCUMENTATION CODES:   Underweight  INTERVENTION:  Provide Ensure Enlive po TID, each supplement provides 350 kcal and 20 grams of protein.  Encourage adequate PO intake.   NUTRITION DIAGNOSIS:   Increased nutrient needs related to cancer and cancer related treatments as evidenced by estimated needs.  GOAL:   Patient will meet greater than or equal to 90% of their needs  MONITOR:   PO intake,Supplement acceptance,Skin,Weight trends,Labs,I & O's  REASON FOR ASSESSMENT:   Consult Assessment of nutrition requirement/status  ASSESSMENT:   55 y.o. male with medical history significant of GE junction adenocarcinoma with liver mets s/p chemo presenting with cough and SOB. Work-up revealed bilateral cavitary lesions on CT imaging of the chest. Pt with cavitary pneumonia with neutropenia.   Pt unavailable during attempted time of visit. RD unable to obtain pt nutrition history at this time. Noted per weight records, pt with a 15% weight loss over the past 2 months, which is significant for time frame. Pt [place] currently on a full liquid diet. Meal completion 25%. RD to order nutritional supplements to aid in caloric and protein needs. Unable to complete Nutrition-Focused physical exam at this time.   Labs and medications reviewed.   Diet Order:   Diet Order            Diet full liquid Room service appropriate? Yes; Fluid consistency: Thin  Diet effective now                 EDUCATION NEEDS:   Not appropriate for education at this time  Skin:  Skin Assessment: Reviewed RN Assessment  Last BM:  5/2  Height:   Ht Readings from Last 1 Encounters:  07/18/20 5\' 9"  (1.753 m)    Weight:   Wt Readings from Last 1 Encounters:  07/19/20 56.3 kg    Ideal Body Weight:  72.7 kg  BMI:  Body mass index is 18.33 kg/m.  Estimated Nutritional Needs:   Kcal:  2000-2200  Protein:  100-110 grams  Fluid:  >/= 2 L/day  Corrin Parker, MS, RD,  LDN RD pager number/after hours weekend pager number on Amion.

## 2020-07-19 NOTE — Consult Note (Signed)
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[place])   Goals of care, counseling/discussion   S/P Left VATS with drainage of pleural effusion, pleural biopsy, creation of pericardial window, placement of pleur-x catheter   Cancer, metastatic ([place])   Chemotherapy-induced neutropenia ([place])   Dysphagia   Malnutrition ([place])   Antibiotics: Vancomycin 5/2 -                    Cefepime 5/2 -   Lines/Tubes: Rt chest portacath   Assessment Multiple new cavitary lung lesions predominantly in the RUL, LLL HIV NR Denies risk factors of TB  ( denies known contact with TB, denies IVDU/incarcerated/homeless, born in [place] and no travel outside of country) Has a Rt sided chest port in place placed in 11/28/2017  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 Although TB is always in the differential in an immunocompromised patient with cavitary lung lesions, it is less likely given his recent pleural and pericardial path positive for malignancy and no obvious risk factors for TB   Recommendations  Will add vancomycin for now while working up for the lung lesions, pharmacy to dose Continue cefepime to cover Pseudomonas given h/o recent greenish sputum Sputum cultures and Quantiferon Fu blood cultures  TTE given concern of septic emboli. If TTE is negative, might need  TEE to assess valves  Vascular US of rt portacath site?  for possible thrombus given CT chest findings concerning for septic emboli Will order fungitell, histoplasma ag and blastomyces ag urine given immunosuppressed status  Following  Rest of the management as per the primary team. Please call with questions or concerns.  Thank you for the consult  Rosiland Oz, MD Infectious Disease Physician [facility] [address] 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 He had a thoracentesis of left pleural effusion on 05/17/20 ( exudative) Cytology was positive for metastatic adenocarcinoma. underwent Left Video Assisted Thoracoscopy with Drainage of pleural effusion, pleural biopsy, creation of pericardial window with drainage of effusion, and placement of Pleur-x catheter on 06/05/20. Cytology from pericardiocentesis and Path from pleura and pericardium was positive for  metastatic adenocarcinoma. Cx were not taken. Patient also had a paracentesis on 06/23/20 ( SAAg>1.1).   Patient was re-started on chemotherapy OP cycle #1 on 06/28/2020, Enhertu - q 3 week dosing -- s/p cycle#15- started on 55/13/2021. Last Chemo 07/13/20 he has been closely following with Oncology where there was concerns about his weight loss.   He presented to the ED yesterday with worsening cough/chest pain for last several  days. History obtained from patient as well as his wife. Patient has been having cough since early March 2022 with some mucus/phlegm production ( not much). However, cough has gotten worse in the last several days associated with more phlegm production, greenish in color ( it used to be whitish before). He also started having increasing chest pain because of the cough. Denies fevers, chills and night sweats. Nausea is on and off. Denies abdominal pain, vomiting, diarrhea and GU symptoms. Denies headache, blurry vision, neck pain, back pain. Denies rashes and joint pain. Appetite is poor and has lost approx 30 lbs since Feb 2022.  He was born in [place], [place]. Denies any travel out of the country, has not traveled out of the state since the [place] pandemic began. Travled to [place] in Feb 2022 and [place] in summer 2021. Has not traveled to other states in the [place] and mid [place]. Lives with his wife. Denies having pets. 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 Denies smoking, alcohol occasionally and denies using IVDU   ROS: 12 point ROS done with all pertinent positives and negatives listed above   Past Medical History:  Diagnosis Date  . 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 Goals of care, counseling/discussion 12/10/2017  . Iron deficiency anemia due to chronic blood loss 12/11/2017  . Malignant neoplasm metastatic to liver ([place]) 12/10/2017  . Metastasis from adenocarcinoma of gastroesophageal structure ([place]) 12/10/2017    Past Surgical History:  Procedure Laterality Date  . CHEST TUBE INSERTION Left 06/05/2020   Procedure: INSERTION PLEURAL DRAINAGE CATHETER;  Surgeon: [name], MD;  Location: [facility];  Service: Thoracic;  Laterality: Left;  . CYST EXCISION Left 01/15/2016   Procedure: EXCISION SEBACEOUS CYST LEFT UPPER BACK;  Surgeon: Georganna Skeans, MD;  Location: [facility];  Service: General;  Laterality: Left;  . IR IMAGING GUIDED PORT INSERTION   12/15/2017  . IR PARACENTESIS  06/23/2020  . PERICARDIAL WINDOW N/A 06/05/2020   Procedure: PERICARDIAL WINDOW;  Surgeon: [name], MD;  Location: [facility];  Service: Thoracic;  Laterality: N/A;  . PLEURAL BIOPSY N/A 06/05/2020   Procedure: PLEURAL BIOPSY;  Surgeon: [name], MD;  Location: [facility];  Service: Thoracic;  Laterality: N/A;  . THORACENTESIS Left 05/17/2020   Procedure: THORACENTESIS;  Surgeon: Spero Geralds, MD;  Location: [facility] END[facility]COPY;  Service: Pulmonary;  Laterality: Left;  Marland Kitchen VIDEO ASSISTED THORACOSCOPY Left 06/05/2020   Procedure: VIDEO ASSISTED THORACOSCOPY;  Surgeon: [name], MD;  Location: [facility];  Service: Thoracic;  Laterality: Left;  '  Scheduled Meds: . Chlorhexidine Gluconate Cloth  6 each Topical Daily  . docusate sodium  100 mg Oral BID  . dronabinol  2.5 mg Oral BID AC  . enoxaparin (LOVENOX) injection  40 mg Subcutaneous Q24H  . fentaNYL  1 patch Transdermal Q72H  . lidocaine-prilocaine  1 application Topical Once  . sodium chloride flush  10-40 mL Intracatheter Q12H  . sodium chloride flush  3 mL Intravenous Q12H  . Tbo-filgastrim (GRANIX) SQ  300  mcg Subcutaneous q1800   Continuous Infusions: . sodium chloride 75 mL/hr at 07/19/20 0447  . ceFEPime (MAXIPIME) IV 2 g (07/19/20 0318)  . vancomycin    . vancomycin     PRN Meds:.acetaminophen **OR** acetaminophen, albuterol, baclofen, bisacodyl, guaiFENesin, hydrALAZINE, morphine injection, ondansetron **OR** ondansetron (ZOFRAN) IV, polyethylene glycol, sodium chloride flush  No Known Allergies  Social History   Socioeconomic History  . Marital status: Married    Spouse name: Not on file  . Number of children: Not on file  . Years of education: Not on file  . Highest education level: Not on file  Occupational History  . Occupation: owns his own company  Tobacco Use  . Smoking status: Never Smoker  . Smokeless tobacco: Never Used  Vaping Use  . Vaping Use: Never used   Substance and Sexual Activity  . Alcohol use: Not Currently    Comment: socially  . Drug use: No  . Sexual activity: Not on file  Other Topics Concern  . Not on file  Social History Narrative  . Not on file   Social Determinants of Health   Financial Resource Strain: Not on file  Food Insecurity: Not on file  Transportation Needs: Not on file  Physical Activity: Not on file  Stress: Not on file  Social Connections: Not on file  Intimate Partner Violence: Not on file    Vitals BP 112/70 (BP Location: Right Arm)   Pulse 91   Temp 97.7 F (36.5 C) (Oral)   Resp 17   Ht _0  (1.753 m)   Wt 56.3 kg   SpO2 98%   BMI 18.33 kg/m '  Physical Exam Constitutional:  malnourished and thin looking     Comments:   Cardiovascular:     Rate and Rhythm: Normal rate and regular rhythm.     Heart sounds: systolic murmur  Pulmonary:     Effort: Pulmonary effort is normal.     Comments:   Abdominal:     Palpations: Abdomen is soft.     Tenderness: Non tender   Musculoskeletal:        General: No swelling or tenderness.   Skin:    Comments: No obvious rashes/lesions , Rt chest portacath site- non tender/no erythema  Neurological:     General: No focal deficit present.   Psychiatric:        Mood and Affect: Mood normal.    Pertinent Microbiology Results for orders placed or performed during the hospital encounter of 07/17/20  Resp Panel by RT-PCR (Flu A&B, Covid) Nasopharyngeal Swab     Status: None   Collection Time: 07/18/20 12:05 AM   Specimen: Nasopharyngeal Swab; Nasopharyngeal(NP) swabs in vial transport medium  Result Value Ref Range Status   SARS Coronavirus 2 by RT PCR NEGATIVE NEGATIVE Final    Comment: (NOTE) SARS-CoV-2 target nucleic acids are NOT DETECTED.  The SARS-CoV-2 RNA is generally detectable in upper respiratory specimens during the acute phase of infection. The lowest concentration of SARS-CoV-2 viral copies this assay can detect is 138  copies/mL. A negative result does not preclude SARS-Cov-2 infection and should not be used as the sole basis for treatment or other patient management decisions. A negative result may occur with  improper specimen collection/handling, submission of specimen other than nasopharyngeal swab, presence of viral mutation(s) within the areas targeted by this assay, and inadequate number of viral copies(<138 copies/mL). A negative result must be combined with clinical observations, patient history, and epidemiological information. The  expected result is Negative.  Fact Sheet for Patients:  EntrepreneurPulse.com.au  Fact Sheet for Healthcare Providers:  IncredibleEmployment.be  This test is no t yet approved or cleared by the [place] FDA and  has been authorized for detection and/or diagnosis of SARS-CoV-2 by FDA under an Emergency Use Authorization (EUA). This EUA will remain  in effect (meaning this test can be used) for the duration of the COVID-19 declaration under Section 564(b)(1) of the Act, 21 U.S.C.section 360bbb-3(b)(1), unless the authorization is terminated  or revoked sooner.       Influenza A by PCR NEGATIVE NEGATIVE Final   Influenza B by PCR NEGATIVE NEGATIVE Final    Comment: (NOTE) The Xpert Xpress SARS-CoV-2/FLU/RSV plus assay is intended as an aid in the diagnosis of influenza from Nasopharyngeal swab specimens and should not be used as a sole basis for treatment. Nasal washings and aspirates are unacceptable for Xpert Xpress SARS-CoV-2/FLU/RSV testing.  Fact Sheet for Patients: EntrepreneurPulse.com.au  Fact Sheet for Healthcare Providers: IncredibleEmployment.be  This test is not yet approved or cleared by the [place] FDA and has been authorized for detection and/or diagnosis of SARS-CoV-2 by FDA under an Emergency Use Authorization (EUA). This EUA will remain in effect (meaning  this test can be used) for the duration of the COVID-19 declaration under Section 564(b)(1) of the Act, 21 U.S.C. section 360bbb-3(b)(1), unless the authorization is terminated or revoked.  Performed at [facility], [address] [place], [place] [address]   Blood culture (routine x 2)     Status: None (Preliminary result)   Collection Time: 07/18/20 12:40 AM   Specimen: BLOOD  Result Value Ref Range Status   Specimen Description   Final    BLOOD Blood Culture results may not be optimal due to an inadequate volume of blood received in culture bottles Performed at [facility], [address] [place], [place] [address]    Special Requests   Final    BOTTLES DRAWN AEROBIC AND ANAEROBIC RIGHT ANTECUBITAL Performed at [facility], [address] [place], [place] [address]    Culture   Final    NO GROWTH < 24 HOURS Performed at [facility], [place] [address] [place], [place] [address]    Report Status PENDING  Incomplete  Blood culture (routine x 2)     Status: None (Preliminary result)   Collection Time: 07/18/20  2:55 AM   Specimen: BLOOD  Result Value Ref Range Status   Specimen Description   Final    BLOOD Blood Culture results may not be optimal due to an inadequate volume of blood received in culture bottles Performed at [facility], [address] [place], [place] [address]    Special Requests   Final    BOTTLES DRAWN AEROBIC AND ANAEROBIC LEFT ANTECUBITAL Performed at [facility], [address] [place], [place] [address]    Culture   Final    NO GROWTH < 24 HOURS Performed at [facility], [place] [address], [place], [place] [address]    Report Status PENDING  Incomplete  MRSA PCR Screening     Status: None   Collection Time: 07/18/20 12:00 PM   Specimen: Nasopharyngeal  Result Value Ref Range Status   MRSA by PCR NEGATIVE NEGATIVE Final    Comment:        The GeneXpert MRSA Assay  (FDA approved for NASAL specimens only),  is one component of a comprehensive MRSA colonization surveillance program. It is not intended to diagnose MRSA infection nor to guide or monitor treatment for MRSA infections. Performed at [facility], [place] [address] [place], [place] [address]      Pertinent Lab seen by me: CBC Latest Ref Rng & Units 07/19/2020 07/18/2020 07/13/2020  WBC 4.0 - 10.5 K/uL 1.7(L) 1.0(LL) 3.2(L)  Hemoglobin 13.0 - 17.0 g/dL 12.5(L) 14.2 13.4  Hematocrit 39.0 - 52.0 % 38.5(L) 43.5 40.6  Platelets 150 - 400 K/uL PLATELET CLUMPS NOTED ON SMEAR, UNABLE TO ESTIMATE 79(L) 132(L)    CMP Latest Ref Rng & Units 07/19/2020 07/18/2020 07/13/2020  Glucose 70 - 99 mg/dL 87 112(H) 135(H)  BUN 6 - 20 mg/dL 31(H) 28(H) 28(H)  Creatinine 0.61 - 1.24 mg/dL 0.73 0.65 0.68  Sodium 135 - 145 mmol/L 142 138 134(L)  Potassium 3.5 - 5.1 mmol/L 4.4 5.1 4.8  Chloride 98 - 111 mmol/L 109 102 98  CO2 22 - 32 mmol/L _0 Calcium 8.9 - 10.3 mg/dL 8.2(L) 8.2(L) 8.7(L)  Total Protein 6.5 - 8.1 g/dL - 5.3(L) 5.5(L)  Total Bilirubin 0.3 - 1.2 mg/dL - 2.1(H) 0.8  Alkaline Phos 38 - 126 U/L - 189(H) 226(H)  AST 15 - 41 U/L - 35 32  ALT 0 - 44 U/L - 30 37    Pertinent Imagings/Other Imagings Plain films and CT images have been personally visualized and interpreted; radiology reports have been reviewed. Decision making incorporated into the Impression / Recommendations.  CT Chest 07/18/20 FINDINGS: Cardiovascular: A right-sided venous Port-A-Cath is in place. The thoracic aorta is normal in appearance without evidence of aneurysmal dilatation or dissection. The pulmonary arteries are normal in appearance, without evidence of intraluminal filling defects. Normal heart size. Trace amount of pericardial fluid is noted.  Mediastinum/Nodes: No enlarged mediastinal, hilar, or axillary lymph nodes. The thyroid gland and trachea demonstrate no significant findings. A dilated,  fluid-filled esophagus is seen.  Lungs/Pleura: A single left-sided pleural drainage catheter is seen with its distal end extending along the anterior aspect of the left lung base. Its distal tip is seen just below the anterior medial aspect of the right atrium and right ventricle.  Marked severity infiltrate is seen within the right lower lobe. This is increased in severity when compared to the prior study. Mild to moderate severity involvement of the adjacent portion of the left upper lobe is seen.  Multiple small, ill-defined cavitary lung lesions are seen scattered throughout the right upper lobe and superior aspect of the left lower lobe. These are new when compared to the prior study.  Multiple small, ill-defined nodular appearing areas are seen scattered throughout the right middle lobe.  There is a small left pleural effusion which is decreased in size when compared to the prior exam. This contains a few tiny foci of air within the lateral aspect of the left lung base.  A mild to moderate amount of air is seen within the anterior aspect of the mediastinum.  Upper Abdomen: There is a small hiatal hernia.  The liver is cirrhotic in appearance.  A large amount of abdominal free fluid is seen.  Musculoskeletal: A mild to moderate amount of soft tissue air is seen along the lateral aspects of the upper chest wall, bilaterally. This extends to involve the soft tissues of the neck on the left. No acute or significant osseous findings.  IMPRESSION: 1. Marked severity left lower lobe infiltrate with mild to  moderate severity infiltrate within the adjacent portion of the left upper lobe. 2. Multiple, new ill-defined cavitary lung lesions involving predominantly the right upper lobe and left lower lobe which is concerning for sequelae associated with septic emboli. 3. Left-sided pleural drainage catheter positioning, as described above. 4. Small left pleural  effusion which contains a tiny amount of pleural air and is decreased in size when compared to the prior study. 5. Small to moderate severity anterior pneumomediastinum. 6. Cirrhotic liver with a large amount of ascites.  I have spent more than 70  minutes for this patient encounter including review of prior medical records with greater than 50% of time being face to face and coordination of their care.  Electronically signed by:   Rosiland Oz, MD Infectious Disease Physician [facility] for Infectious Disease Pager: [phone]

## 2020-07-19 NOTE — Plan of Care (Signed)

## 2020-07-19 NOTE — Progress Notes (Signed)
PROGRESS NOTE    Matthew Reynolds  OQH:[id number] DOB: 28-Apr-1965 DOA: 07/17/2020 PCP: Cari Caraway, MD    Chief Complaint  Patient presents with  . Cough    Brief Narrative:  Matthew Reynolds is a 55 y.o. male with medical history significant of GE junction adenocarcinoma with liver mets presenting with cough and SOB.  He has esophageal cancer, diagnosed in 11/2017. Went through chemo. Localized recurrent in 02/2019 and had radiation therapy.  He was rehospitalized from 3/21-22 by Dr. Roxan Hockey for VATS, pericardial window, and placement of Pleur-x catheter due to malignant effusions. He is currently being treated with Taxol/Cyramza.  He is only draining Pleur-x about once a week.  he presents to ED, to with sob, productive cough. Last chemo on 4/28.  Patient presented to [place] with worsening cough and shortness of breath over the past several days. Work-up revealed bilateral cavitary lesions on CT imaging of the chest. ER provider discussed CT imaging with radiology and radiology feels that these are likely infectious in etiology, possibly septic emboli and not secondary to metastatic disease. Blood work revealed substantial neutropenia. Concern for early sepsis in this immunocompromised patient currently receiving chemotherapy and therefore patient has been given intravenous vancomycin and cefepime. Oncology on board , recommended started on Granix 300 mcg for 3 days. Echocardiogram for evaluation of endocarditis. Plan for pleurex drainage today and possible removal of the pleurex catheter   Assessment & Plan:   Principal Problem:   Cavitary pneumonia Active Problems:   Adenocarcinoma of cardio-esophageal junction (HCC)   Goals of care, counseling/discussion   S/P Left VATS with drainage of pleural effusion, pleural biopsy, creation of pericardial window, placement of pleur-x catheter   Cancer, metastatic (HCC)   Chemotherapy-induced neutropenia ([place])   Dysphagia    Malnutrition ([place])     Cavitary pneumonia with neutropenia: In the setting of metastatic carcinoma, s/p chemotherapy on 07/13/2020. CT chest showed left lower lobe infiltrate with left upper lobe mild to moderate infiltrate, multiple cavitary lung lesions in the right upper lobe and left lower lobe, questionable septic emboli.  Left-sided Pleurx catheter in place.  Small to moderate pneumomediastinum present. Patient's influenza and COVID are negative. Continue with broad-spectrum IV antibiotics. Continue with Granix infusion for 3 days. .  Echocardiogram ordered for evaluation of endocarditis. Pain control. ID consulted by admitting physician for antibiotic duration.    Metastatic GE junction adenocarcinoma S/p chemo with Taxol/ cyramza.  S/p VATS, pericardial window and Pleurx catheter placement in March for malignant effusions. Patient currently is neutropenic and has thrombocytopenia. Granix ordered by oncology.  Continue to monitor blood counts.   Dysphagia SLP evaluation pending, full liquid diet for now.    Moderate protein calorie malnutrition. Nutrition consulted        DVT prophylaxis: (Lovenox) Code Status: FULL CODE Family Communication: family at bedside.  Disposition:   Status is: Inpatient  Remains inpatient appropriate because:Ongoing diagnostic testing needed not appropriate for outpatient work up and IV treatments appropriate due to intensity of illness or inability to take PO   Dispo: The patient is from: Home              Anticipated d/c is to: Home              Patient currently is not medically stable to d/c.   Difficult to place patient No       Consultants:   Oncology  CTVS   Procedures: none.   Antimicrobials:  Antibiotics  Given (last 72 hours)    Date/Time Action Medication Dose Rate   07/18/20 0256 New Bag/Given   ceFEPIme (MAXIPIME) 2 g in sodium chloride 0.9 % 100 mL IVPB 2 g 200 mL/hr   07/18/20 0333 New Bag/Given    vancomycin (VANCOCIN) IVPB 1000 mg/200 mL premix 1,000 mg 200 mL/hr   07/18/20 1151 New Bag/Given   ceFEPIme (MAXIPIME) 2 g in sodium chloride 0.9 % 100 mL IVPB 2 g 200 mL/hr   07/18/20 2007 New Bag/Given   ceFEPIme (MAXIPIME) 2 g in sodium chloride 0.9 % 100 mL IVPB 2 g 200 mL/hr   07/19/20 0318 New Bag/Given   ceFEPIme (MAXIPIME) 2 g in sodium chloride 0.9 % 100 mL IVPB 2 g 200 mL/hr   07/19/20 1056 New Bag/Given   ceFEPIme (MAXIPIME) 2 g in sodium chloride 0.9 % 100 mL IVPB 2 g 200 mL/hr          Subjective: Not feeling good,  Objective: Vitals:   07/18/20 1040 07/18/20 2134 07/18/20 2304 07/19/20 0304  BP: 103/75 108/86 110/84 112/70  Pulse: (!) 103 (!) 107 92 91  Resp: 18 18 20 17   Temp: 98.1 F (36.7 C) 98.1 F (36.7 C) 98 F (36.7 C) 97.7 F (36.5 C)  TempSrc: Oral Oral Oral Oral  SpO2: 97% 93% 97% 98%  Weight: 55.7 kg   56.3 kg  Height: 5\' 9"  (1.753 m)       Intake/Output Summary (Last 24 hours) at 07/19/2020 1100 Last data filed at 07/18/2020 2300 Gross per 24 hour  Intake 2159.17 ml  Output 475 ml  Net 1684.17 ml   Filed Weights   07/17/20 2331 07/18/20 1040 07/19/20 0304  Weight: 55.8 kg 55.7 kg 56.3 kg    Examination:  General exam: Appears calm and comfortable  Respiratory system: scattered rhonchi. No wheezing heard. On RA. Tachypnea present.  Cardiovascular system: S1 & S2 heard, RRR. No JVD,No pedal edema. Gastrointestinal system: Abdomen is nondistended, soft and nontender.  Normal bowel sounds heard. Central nervous system: Alert and oriented. No focal neurological deficits. Extremities: Symmetric 5 x 5 power. Skin: No rashes, lesions or ulcers Psychiatry:  Mood & affect appropriate.     Data Reviewed: I have personally reviewed following labs and imaging studies  CBC: Recent Labs  Lab 07/13/20 1001 07/18/20 0005 07/19/20 0500  WBC 3.2* 1.0* 1.7*  NEUTROABS 1.6* 0.7*  --   HGB 13.4 14.2 12.5*  HCT 40.6 43.5 38.5*  MCV 87.5 88.4  88.5  PLT 132* 79* PLATELET CLUMPS NOTED ON SMEAR, UNABLE TO ESTIMATE    Basic Metabolic Panel: Recent Labs  Lab 07/13/20 1001 07/18/20 0005 07/19/20 0500  NA 134* 138 142  K 4.8 5.1 4.4  CL 98 102 109  CO2 31 27 27   GLUCOSE 135* 112* 87  BUN 28* 28* 31*  CREATININE 0.68 0.65 0.73  CALCIUM 8.7* 8.2* 8.2*    GFR: Estimated Creatinine Clearance: 84.1 mL/min (by C-G formula based on SCr of 0.73 mg/dL).  Liver Function Tests: Recent Labs  Lab 07/13/20 1001 07/18/20 0005  AST 32 35  ALT 37 30  ALKPHOS 226* 189*  BILITOT 0.8 2.1*  PROT 5.5* 5.3*  ALBUMIN 2.7* 2.1*    CBG: No results for input(s): GLUCAP in the last 168 hours.   Recent Results (from the past 240 hour(s))  Resp Panel by RT-PCR (Flu A&B, Covid) Nasopharyngeal Swab     Status: None   Collection Time: 07/18/20 12:05 AM   Specimen:  Nasopharyngeal Swab; Nasopharyngeal(NP) swabs in vial transport medium  Result Value Ref Range Status   SARS Coronavirus 2 by RT PCR NEGATIVE NEGATIVE Final    Comment: (NOTE) SARS-CoV-2 target nucleic acids are NOT DETECTED.  The SARS-CoV-2 RNA is generally detectable in upper respiratory specimens during the acute phase of infection. The lowest concentration of SARS-CoV-2 viral copies this assay can detect is 138 copies/mL. A negative result does not preclude SARS-Cov-2 infection and should not be used as the sole basis for treatment or other patient management decisions. A negative result may occur with  improper specimen collection/handling, submission of specimen other than nasopharyngeal swab, presence of viral mutation(s) within the areas targeted by this assay, and inadequate number of viral copies(<138 copies/mL). A negative result must be combined with clinical observations, patient history, and epidemiological information. The expected result is Negative.  Fact Sheet for Patients:  EntrepreneurPulse.com.au  Fact Sheet for Healthcare  Providers:  IncredibleEmployment.be  This test is no t yet approved or cleared by the [place] FDA and  has been authorized for detection and/or diagnosis of SARS-CoV-2 by FDA under an Emergency Use Authorization (EUA). This EUA will remain  in effect (meaning this test can be used) for the duration of the COVID-19 declaration under Section 564(b)(1) of the Act, 21 U.S.C.section 360bbb-3(b)(1), unless the authorization is terminated  or revoked sooner.       Influenza A by PCR NEGATIVE NEGATIVE Final   Influenza B by PCR NEGATIVE NEGATIVE Final    Comment: (NOTE) The Xpert Xpress SARS-CoV-2/FLU/RSV plus assay is intended as an aid in the diagnosis of influenza from Nasopharyngeal swab specimens and should not be used as a sole basis for treatment. Nasal washings and aspirates are unacceptable for Xpert Xpress SARS-CoV-2/FLU/RSV testing.  Fact Sheet for Patients: EntrepreneurPulse.com.au  Fact Sheet for Healthcare Providers: IncredibleEmployment.be  This test is not yet approved or cleared by the [place] FDA and has been authorized for detection and/or diagnosis of SARS-CoV-2 by FDA under an Emergency Use Authorization (EUA). This EUA will remain in effect (meaning this test can be used) for the duration of the COVID-19 declaration under Section 564(b)(1) of the Act, 21 U.S.C. section 360bbb-3(b)(1), unless the authorization is terminated or revoked.  Performed at [facility], [address] [place], [place] [address]   Blood culture (routine x 2)     Status: None (Preliminary result)   Collection Time: 07/18/20 12:40 AM   Specimen: BLOOD  Result Value Ref Range Status   Specimen Description   Final    BLOOD Blood Culture results may not be optimal due to an inadequate volume of blood received in culture bottles Performed at [facility], [address] [place], [place] [address]     Special Requests   Final    BOTTLES DRAWN AEROBIC AND ANAEROBIC RIGHT ANTECUBITAL Performed at [facility], [address] [place], [place] [address]    Culture   Final    NO GROWTH < 24 HOURS Performed at [facility], [place] [address] [place], [place] [address]    Report Status PENDING  Incomplete  Blood culture (routine x 2)     Status: None (Preliminary result)   Collection Time: 07/18/20  2:55 AM   Specimen: BLOOD  Result Value Ref Range Status   Specimen Description   Final    BLOOD Blood Culture results may not be optimal due to an inadequate volume of blood  received in culture bottles Performed at [facility], [address] [place], [place] [address]    Special Requests   Final    BOTTLES DRAWN AEROBIC AND ANAEROBIC LEFT ANTECUBITAL Performed at [facility], [address] [place], [place] [address]    Culture   Final    NO GROWTH < 24 HOURS Performed at [facility], [place] [address] [place], [place] [address]    Report Status PENDING  Incomplete  MRSA PCR Screening     Status: None   Collection Time: 07/18/20 12:00 PM   Specimen: Nasopharyngeal  Result Value Ref Range Status   MRSA by PCR NEGATIVE NEGATIVE Final    Comment:        The GeneXpert MRSA Assay (FDA approved for NASAL specimens only), is one component of a comprehensive MRSA colonization surveillance program. It is not intended to diagnose MRSA infection nor to guide or monitor treatment for MRSA infections. Performed at [facility], [place] [address], [place], [place] [address]          Radiology Studies: DG Chest 2 View  Result Date: 07/17/2020 CLINICAL DATA:  Cough and shortness of breath for 1 day, esophageal carcinoma EXAM: CHEST - 2 VIEW COMPARISON:  07/04/2020 FINDINGS: Frontal and lateral views of the chest demonstrate stable right chest wall port and left-sided pleural drainage catheter. There has been interval resolution of the  pneumomediastinum, with near complete resolution of subcutaneous emphysema seen previously. Cardiac silhouette is unremarkable. There is multifocal bilateral airspace disease, greatest in the left lower lobe. Small left pleural effusion is unchanged. No pneumothorax. No acute bony abnormalities. IMPRESSION: 1. Progressive multifocal bilateral airspace disease. 2. Small left effusion. 3. Resolution of the pneumomediastinum seen previously. Near complete resolution of the chest wall subcutaneous emphysema. 4. Support devices as above. Electronically Signed   By: Randa Ngo M.D.   On: 07/17/2020 23:56   CT Chest W Contrast  Result Date: 07/18/2020 CLINICAL DATA:  History of pericardial window and pleural biopsy on June 05, 2020, presenting with productive cough and shortness of breath. EXAM: CT CHEST WITH CONTRAST TECHNIQUE: Multidetector CT imaging of the chest was performed during intravenous contrast administration. CONTRAST:  70mL OMNIPAQUE IOHEXOL 300 MG/ML  SOLN COMPARISON:  May 16, 2020 FINDINGS: Cardiovascular: A right-sided venous Port-A-Cath is in place. The thoracic aorta is normal in appearance without evidence of aneurysmal dilatation or dissection. The pulmonary arteries are normal in appearance, without evidence of intraluminal filling defects. Normal heart size. Trace amount of pericardial fluid is noted. Mediastinum/Nodes: No enlarged mediastinal, hilar, or axillary lymph nodes. The thyroid gland and trachea demonstrate no significant findings. A dilated, fluid-filled esophagus is seen. Lungs/Pleura: A single left-sided pleural drainage catheter is seen with its distal end extending along the anterior aspect of the left lung base. Its distal tip is seen just below the anterior medial aspect of the right atrium and right ventricle. Marked severity infiltrate is seen within the right lower lobe. This is increased in severity when compared to the prior study. Mild to moderate severity involvement  of the adjacent portion of the left upper lobe is seen. Multiple small, ill-defined cavitary lung lesions are seen scattered throughout the right upper lobe and superior aspect of the left lower lobe. These are new when compared to the prior study. Multiple small, ill-defined nodular appearing areas are seen scattered throughout the right middle lobe. There is a small left pleural effusion which is  decreased in size when compared to the prior exam. This contains a few tiny foci of air within the lateral aspect of the left lung base. A mild to moderate amount of air is seen within the anterior aspect of the mediastinum. Upper Abdomen: There is a small hiatal hernia. The liver is cirrhotic in appearance. A large amount of abdominal free fluid is seen. Musculoskeletal: A mild to moderate amount of soft tissue air is seen along the lateral aspects of the upper chest wall, bilaterally. This extends to involve the soft tissues of the neck on the left. No acute or significant osseous findings. IMPRESSION: 1. Marked severity left lower lobe infiltrate with mild to moderate severity infiltrate within the adjacent portion of the left upper lobe. 2. Multiple, new ill-defined cavitary lung lesions involving predominantly the right upper lobe and left lower lobe which is concerning for sequelae associated with septic emboli. 3. Left-sided pleural drainage catheter positioning, as described above. 4. Small left pleural effusion which contains a tiny amount of pleural air and is decreased in size when compared to the prior study. 5. Small to moderate severity anterior pneumomediastinum. 6. Cirrhotic liver with a large amount of ascites. Electronically Signed   By: Virgina Norfolk M.D.   On: 07/18/2020 02:29        Scheduled Meds: . Chlorhexidine Gluconate Cloth  6 each Topical Daily  . docusate sodium  100 mg Oral BID  . dronabinol  2.5 mg Oral BID AC  . enoxaparin (LOVENOX) injection  40 mg Subcutaneous Q24H  .  fentaNYL  1 patch Transdermal Q72H  . lidocaine-prilocaine  1 application Topical Once  . sodium chloride flush  10-40 mL Intracatheter Q12H  . sodium chloride flush  3 mL Intravenous Q12H  . Tbo-filgastrim (GRANIX) SQ  300 mcg Subcutaneous q1800   Continuous Infusions: . sodium chloride 75 mL/hr at 07/19/20 0447  . ceFEPime (MAXIPIME) IV 2 g (07/19/20 1056)  . vancomycin    . vancomycin       LOS: 1 day       Hosie Poisson, MD [facility]   To contact the attending provider between 7A-7P or the covering provider during after hours 7P-7A, please log into the web site [email or web address] and access using universal [place] password for that web site. If you do not have the password, please call the hospital operator.  07/19/2020, 11:00 AM

## 2020-07-20 ENCOUNTER — Other Ambulatory Visit (HOSPITAL_BASED_OUTPATIENT_CLINIC_OR_DEPARTMENT_OTHER): Payer: Self-pay | Admitting: Internal Medicine

## 2020-07-20 ENCOUNTER — Other Ambulatory Visit (HOSPITAL_BASED_OUTPATIENT_CLINIC_OR_DEPARTMENT_OTHER): Payer: Self-pay

## 2020-07-20 DIAGNOSIS — J189 Pneumonia, unspecified organism: Secondary | ICD-10-CM | POA: Diagnosis not present

## 2020-07-20 DIAGNOSIS — Z515 Encounter for palliative care: Secondary | ICD-10-CM

## 2020-07-20 DIAGNOSIS — Z7189 Other specified counseling: Secondary | ICD-10-CM

## 2020-07-20 DIAGNOSIS — J984 Other disorders of lung: Secondary | ICD-10-CM | POA: Diagnosis not present

## 2020-07-20 DIAGNOSIS — Z66 Do not resuscitate: Secondary | ICD-10-CM

## 2020-07-20 LAB — CBC WITH DIFFERENTIAL/PLATELET
Abs Immature Granulocytes: 0.04 10*3/uL (ref 0.00–0.07)
Basophils Absolute: 0.1 10*3/uL (ref 0.0–0.1)
Basophils Relative: 2 %
Eosinophils Absolute: 0 10*3/uL (ref 0.0–0.5)
Eosinophils Relative: 0 %
HCT: 38.4 % — ABNORMAL LOW (ref 39.0–52.0)
Hemoglobin: 12.4 g/dL — ABNORMAL LOW (ref 13.0–17.0)
Immature Granulocytes: 2 %
Lymphocytes Relative: 11 %
Lymphs Abs: 0.3 10*3/uL — ABNORMAL LOW (ref 0.7–4.0)
MCH: 29.1 pg (ref 26.0–34.0)
MCHC: 32.3 g/dL (ref 30.0–36.0)
MCV: 90.1 fL (ref 80.0–100.0)
Monocytes Absolute: 0.4 10*3/uL (ref 0.1–1.0)
Monocytes Relative: 17 %
Neutro Abs: 1.7 10*3/uL (ref 1.7–7.7)
Neutrophils Relative %: 68 %
Platelets: UNDETERMINED 10*3/uL (ref 150–400)
RBC: 4.26 MIL/uL (ref 4.22–5.81)
RDW: 15 % (ref 11.5–15.5)
WBC: 2.5 10*3/uL — ABNORMAL LOW (ref 4.0–10.5)
nRBC: 0 % (ref 0.0–0.2)

## 2020-07-20 LAB — BASIC METABOLIC PANEL
Anion gap: 9 (ref 5–15)
BUN: 29 mg/dL — ABNORMAL HIGH (ref 6–20)
CO2: 24 mmol/L (ref 22–32)
Calcium: 8.2 mg/dL — ABNORMAL LOW (ref 8.9–10.3)
Chloride: 110 mmol/L (ref 98–111)
Creatinine, Ser: 0.7 mg/dL (ref 0.61–1.24)
GFR, Estimated: >60 mL/min (ref >60)
Glucose, Bld: 63 mg/dL — ABNORMAL LOW (ref 70–99)
Potassium: 3.7 mmol/L (ref 3.5–5.1)
Sodium: 143 mmol/L (ref 135–145)

## 2020-07-20 MED ORDER — HEPARIN SOD (PORK) LOCK FLUSH 100 UNIT/ML IV SOLN
500.0000 [IU] | Freq: Once | INTRAVENOUS | Status: AC
  Administered 2020-07-20: 500 [IU] via INTRAVENOUS
  Filled 2020-07-20: qty 5

## 2020-07-20 MED ORDER — ALBUTEROL SULFATE (2.5 MG/3ML) 0.083% IN NEBU
2.5000 mg | INHALATION_SOLUTION | RESPIRATORY_TRACT | 12 refills | Status: AC | PRN
  Filled 2020-07-20: qty 75, 3d supply, fill #0

## 2020-07-20 MED ORDER — ENSURE ENLIVE PO LIQD
237.0000 mL | Freq: Three times a day (TID) | ORAL | 12 refills | Status: AC
  Filled 2020-07-20: qty 237, 1d supply, fill #0

## 2020-07-20 MED ORDER — AMOXICILLIN-POT CLAVULANATE 250-62.5 MG/5ML PO SUSR
500.0000 mg | Freq: Two times a day (BID) | ORAL | 0 refills | Status: AC
  Filled 2020-07-20: qty 150, 8d supply, fill #0

## 2020-07-20 MED ORDER — LEVOFLOXACIN 750 MG PO TABS
750.0000 mg | ORAL_TABLET | Freq: Every day | ORAL | 0 refills | Status: DC
  Filled 2020-07-20: qty 5, 5d supply, fill #0

## 2020-07-20 MED ORDER — OXYCODONE HCL 20 MG/ML PO CONC
10.0000 mg | ORAL | 0 refills | Status: AC | PRN
  Filled 2020-07-20: qty 30, 10d supply, fill #0

## 2020-07-20 MED ORDER — GUAIFENESIN ER 600 MG PO TB12: 600.0000 mg | ORAL_TABLET | Freq: Two times a day (BID) | ORAL | Status: AC | PRN

## 2020-07-20 MED ORDER — POLYETHYLENE GLYCOL 3350 17 G PO PACK
17.0000 g | PACK | Freq: Every day | ORAL | 0 refills | Status: AC | PRN
  Filled 2020-07-20: qty 14, 14d supply, fill #0

## 2020-07-20 MED ORDER — OXYCODONE HCL 20 MG/ML PO CONC
10.0000 mg | ORAL | 0 refills | Status: DC | PRN
  Filled 2020-07-20: qty 10, 4d supply, fill #0

## 2020-07-20 MED ORDER — LIDOCAINE HCL (PF) 1 % IJ SOLN
INTRAMUSCULAR | Status: AC
  Filled 2020-07-20: qty 30

## 2020-07-20 NOTE — Progress Notes (Signed)
  Unfortunately, the prealbumin came back less than 5.  I think this clearly shows [place] where things are going.  I had a long talk with Mr. Prows this morning.  I explained to him what I thought was going on and that his prealbumin was so low that his body was beginning to "shut down."  I really do not see any further treatment for him.  I would never treat somebody with a prealbumin less than 10.  I really believe that we have to focus on quality of life.  I think comfort care is where we really need to prioritize.  I talked to him about my opinions as to how long I think he has.  I really believe that he is going have a tough time getting through the month of May.  With a prealbumin less than 5, I have not seen any patient survive more than 4-6 weeks.  We talked about end-of-life issues.  We talked about being kept alive on ventilators and life support.  He does not want this.  I totally agree with this.  I think if he were to go on life support, he would never come off.  As such, he is now a DO NOT RESUSCITATE.  I just hate the fact that his cancer has turned incredibly virulent.  The biology of his cancer has always been 1 that would respond to treatment.  Now, I just think that his malignancy is just refractory.  Having this pneumonia and septic emboli, adds more stress to his body.  I talked him about Hospice.  I think Hospice would be excellent for him when he goes home.  I know he would like to be home.  I know he would have great support at home.  We can certainly get hospice going once he is discharged.  I really hate that he is declining so quickly.  Again I just think that the biology was cancer has changed dramatically and that his body is just not able to deal with this and he is declining and his body is not able to handle the stress that is placed upon it.  I will call his wife this morning and talk to her.  I know she has always been very supportive of him.  She is really been a  source of strength.  I know this will be incredibly tough on her.  I really feel bad for her.  I know that he is getting great care from all the staff up on [address].  Lattie Haw, MD  2 Timothy 4:16-18

## 2020-07-20 NOTE — Progress Notes (Signed)
  Subjective: Minimal output from pleural catheter yesterday Dr. Antonieta Pert note reviewed No new complaints  Objective: Vital signs in last 24 hours: Temp:  [97.5 F (36.4 C)-97.7 F (36.5 C)] 97.7 F (36.5 C) (05/05 0813) Pulse Rate:  [89-105] 89 (05/05 0813) Cardiac Rhythm: Normal sinus rhythm (05/04 1934) Resp:  [16-17] 16 (05/05 0813) BP: (105-120)/(81-92) 112/92 (05/05 0813) SpO2:  [97 %-100 %] 100 % (05/05 0813) Weight:  [56.8 kg] 56.8 kg (05/05 0420)  Hemodynamic parameters for last 24 hours:    Intake/Output from previous day: 05/04 0701 - 05/05 0700 In: 610 [P.O.:360; IV Piggyback:250] Out: 400 [Urine:400] Intake/Output this shift: Total I/O In: 240 [P.O.:240] Out: -   General appearance: alert, cooperative and no distress  Lab Results: Recent Labs    07/19/20 0500 07/20/20 0414  WBC 1.7* 2.5*  HGB 12.5* 12.4*  HCT 38.5* 38.4*  PLT PLATELET CLUMPS NOTED ON SMEAR, UNABLE TO ESTIMATE PLATELET CLUMPS NOTED ON SMEAR, UNABLE TO ESTIMATE   BMET:  Recent Labs    07/19/20 0500 07/20/20 0414  NA 142 143  K 4.4 3.7  CL 109 110  CO2 27 24  GLUCOSE 87 63*  BUN 31* 29*  CREATININE 0.73 0.70  CALCIUM 8.2* 8.2*    PT/INR: No results for input(s): LABPROT, INR in the last 72 hours. ABG    Component Value Date/Time   PHART 7.425 06/02/2020 0842   HCO3 23.9 06/02/2020 0842   O2SAT 97.6 06/02/2020 0842   CBG (last 3)  No results for input(s): GLUCAP in the last 72 hours.  Assessment/Plan: There was minimal output from the pleural catheter with drainage yesterday. There was minimal output with his most recent draiange about a week ago as well.   Discussed options of removing tube v leaving in place in case effusion were to recur. He has some discomfort from the tube and wishes to have it removed.   LOS: 2 days    [name] 07/20/2020

## 2020-07-20 NOTE — Plan of Care (Signed)

## 2020-07-20 NOTE — Discharge Summary (Addendum)
Physician Discharge Summary  Matthew Reynolds:[id number] DOB: [birth year] DOA: 07/17/2020  PCP: Cari Caraway, MD  Admit date: 07/17/2020 Discharge date: 07/20/2020  Admitted From: HOME.  Disposition:  HOME.  Recommendations for Outpatient Follow-up:  Follow up with hospice MD as recommended.   Discharge Condition: Hospice.  CODE STATUS: DNR.  Diet recommendation: full liquid diet.   Brief/Interim Summary: Matthew Reynolds a 55 y.o.malewith medical history significant ofGE junction adenocarcinoma with liver mets presenting withcough and SOB.He has esophageal cancer, diagnosed in 11/2017. Went through chemo. Localized recurrent in 02/2019 and had radiation therapy. He was rehospitalized from 3/21-22 by Dr. Roxan Hockey for VATS, pericardial window, and placement of Pleur-x catheter due to malignant effusions. He is currently being treated with Taxol/Cyramza. He is only draining Pleur-x about once a week. he presents to ED, to with sob, productive cough. Last chemo on 4/28.  Patient presented to [place] with worsening cough and shortness of breath over the past several days. Work-up revealed bilateral cavitary lesions on CT imaging of the chest. ER provider discussed CT imaging with radiology and radiology feels that these are likely infectious in etiology, possibly septic emboli and not secondary to metastatic disease. Blood work revealed substantial neutropenia. Concern for early sepsis in thisimmunocompromisedpatient currently receiving chemotherapy and therefore patient has been given intravenous vancomycin and cefepime. Oncology on board , recommended started on Granix 300 mcg for 3 days. 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     Sepsis from Cavitary pneumonia with neutropenia present on admission: In the setting of metastatic carcinoma, s/p chemotherapy on 07/13/2020. CT chest showed left lower lobe infiltrate with left upper lobe mild to moderate infiltrate, multiple cavitary lung lesions in the right upper lobe and left lower lobe, questionable septic emboli.  Small to moderate pneumomediastinum present. Patient's influenza and COVID are negative. Started on  broad-spectrum IV antibiotics, transition to oral levaquin to complete the course.  Echocardiogram negative for endocarditis.  In view of his poor prognosis, oncology recommended hospice . Palliative care consulted and pt and family decided to transition to home with hospice .  palliative recommended roxicodone 10 mg every 4 hours prn.  Meanwhile cardiothoracic surgery removed the pleurex catheter due to minimal output.     Metastatic GE junction adenocarcinoma S/p chemo with Taxol/ cyramza.  S/p VATS, pericardial window and Pleurx catheter placement in March for malignant effusions. Patient currently is neutropenic and has thrombocytopenia. Granix ordered by oncology.    Dysphagia SLP evaluation  full liquid diet for now.    Moderate protein calorie malnutrition. Nutrition consulted       Discharge Instructions  Discharge Instructions    Diet - low sodium heart healthy   Complete by: As directed    Discharge instructions   Complete by: As directed    Follow up with PCP and Dr Marin Olp as recommended.   Increase activity slowly   Complete by: As directed       Allergies  as of 07/20/2020   No Known Allergies     Medication List    STOP taking these medications   hydrocortisone 10 MG tablet Commonly known as: CORTEF   spironolactone 25 MG tablet Commonly known as: ALDACTONE   traMADol 50 MG tablet Commonly known as: ULTRAM     TAKE these medications   acetaminophen 500 MG tablet Commonly known as: TYLENOL Take 500 mg by mouth every 6 (six) hours as needed for mild pain.   albuterol (2.5 MG/3ML) 0.083% nebulizer solution Commonly known as: PROVENTIL Take 3 mLs (2.5 mg total) by nebulization every 2 (two) hours as needed for wheezing.   amoxicillin-clavulanate 250-62.5 MG/5ML suspension Commonly known as: AUGMENTIN Take 10 mLs (500 mg total) by mouth 2 (two) times daily for 7 days.   baclofen 10 MG tablet Commonly known as: LIORESAL TAKE 1 TABLET (10 MG TOTAL) BY MOUTH EVERY 8 (EIGHT) HOURS AS NEEDED FOR MUSCLE SPASMS. What changed:   how much to take  how to take this  when to take this  reasons to take this   cetirizine 10 MG tablet Commonly known as: ZYRTEC Take 10 mg by mouth daily as needed for allergies.   dronabinol 2.5 MG capsule Commonly known as: MARINOL Take 1 capsule (2.5 mg total) by mouth 2 (two) times daily before lunch and supper.   feeding supplement Liqd Take 237 mLs by mouth 3 (three) times daily between meals. Notes to patient: He was taking Boost instead of Ensure.   fentaNYL 25 MCG/HR Commonly known as: [place] 1 patch onto the skin every 3 (three) days.   guaiFENesin 600 MG 12 hr tablet Commonly known as: MUCINEX Take 1 tablet (600 mg total) by mouth 2 (two) times daily as needed for cough.   lidocaine-prilocaine cream Commonly known as: EMLA Apply to affected area once   oxyCODONE 20 MG/ML concentrated solution Commonly known as: ROXICODONE INTENSOL Take 0.5 mLs (10 mg total) by mouth every 4 (four) hours as needed for severe pain.   polyethylene glycol 17 g  packet Commonly known as: MIRALAX / GLYCOLAX Take 17 g by mouth daily as needed for mild constipation.       Follow-up Information    [facility] [place] Follow up.   Why: Hospice Services-office to call the family.  Contact information: [address] [place] [address] [phone]             No Known Allergies  Consultations:  Oncology  palliative care    Procedures/Studies: DG Chest 2 View  Result Date: 07/17/2020 CLINICAL DATA:  Cough and shortness of breath for 1 day, esophageal carcinoma EXAM: CHEST - 2 VIEW COMPARISON:  07/04/2020 FINDINGS: Frontal and lateral views of the chest demonstrate stable right chest wall port and left-sided pleural drainage catheter. There has been interval resolution of the pneumomediastinum, with near complete resolution of subcutaneous emphysema seen previously. Cardiac silhouette is unremarkable. There is multifocal bilateral airspace disease, greatest in the left lower lobe. Small left pleural effusion is unchanged. No pneumothorax. No acute bony abnormalities. IMPRESSION: 1. Progressive multifocal bilateral airspace disease. 2. Small left effusion. 3. Resolution of the pneumomediastinum seen previously. Near complete resolution of the chest wall subcutaneous emphysema. 4. Support devices as above. Electronically Signed   By: Randa Ngo M.D.   On: 07/17/2020 23:56   DG Chest 2 View  Result Date: 07/04/2020 CLINICAL DATA:  Lung cancer post VATS EXAM: CHEST - 2 VIEW COMPARISON:  06/20/2020 FINDINGS:  RIGHT jugular Port-A-Cath with tip projecting over SVC. LEFT basilar thoracostomy tube, proximal side-port external to costal margin. Normal heart size, mediastinal contours, and pulmonary vascularity. Pneumomediastinum present. Small bibasilar pleural effusions with tiny loculated pneumothorax at LEFT base. LEFT basilar atelectasis. Significant chest wall emphysema LEFT greater than RIGHT extending into axillae and  cervical regions bilaterally. Large rounded soft tissue density projects over RIGHT upper lobe at the supraclavicular region, question skin fold. No definite RIGHT pneumothorax. Osseous structures unremarkable. IMPRESSION: Proximal side-port of LEFT thoracostomy tube is external to the costal margin. Small BILATERAL pleural effusions, LEFT greater than RIGHT, with tiny LEFT basilar pneumothorax and LEFT basilar atelectasis. Extensive chest wall emphysema and pneumomediastinum. These results will be called to the ordering clinician or representative by the Radiologist Assistant, and communication documented in the PACS or [employer]. Electronically Signed   By: Lavonia Dana M.D.   On: 07/04/2020 08:52   CT Chest W Contrast  Result Date: 07/18/2020 CLINICAL DATA:  History of pericardial window and pleural biopsy on June 05, 2020, presenting with productive cough and shortness of breath. EXAM: CT CHEST WITH CONTRAST TECHNIQUE: Multidetector CT imaging of the chest was performed during intravenous contrast administration. CONTRAST:  25mL OMNIPAQUE IOHEXOL 300 MG/ML  SOLN COMPARISON:  May 16, 2020 FINDINGS: Cardiovascular: A right-sided venous Port-A-Cath is in place. The thoracic aorta is normal in appearance without evidence of aneurysmal dilatation or dissection. The pulmonary arteries are normal in appearance, without evidence of intraluminal filling defects. Normal heart size. Trace amount of pericardial fluid is noted. Mediastinum/Nodes: No enlarged mediastinal, hilar, or axillary lymph nodes. The thyroid gland and trachea demonstrate no significant findings. A dilated, fluid-filled esophagus is seen. Lungs/Pleura: A single left-sided pleural drainage catheter is seen with its distal end extending along the anterior aspect of the left lung base. Its distal tip is seen just below the anterior medial aspect of the right atrium and right ventricle. Marked severity infiltrate is seen within the right lower  lobe. This is increased in severity when compared to the prior study. Mild to moderate severity involvement of the adjacent portion of the left upper lobe is seen. Multiple small, ill-defined cavitary lung lesions are seen scattered throughout the right upper lobe and superior aspect of the left lower lobe. These are new when compared to the prior study. Multiple small, ill-defined nodular appearing areas are seen scattered throughout the right middle lobe. There is a small left pleural effusion which is decreased in size when compared to the prior exam. This contains a few tiny foci of air within the lateral aspect of the left lung base. A mild to moderate amount of air is seen within the anterior aspect of the mediastinum. Upper Abdomen: There is a small hiatal hernia. The liver is cirrhotic in appearance. A large amount of abdominal free fluid is seen. Musculoskeletal: A mild to moderate amount of soft tissue air is seen along the lateral aspects of the upper chest wall, bilaterally. This extends to involve the soft tissues of the neck on the left. No acute or significant osseous findings. IMPRESSION: 1. Marked severity left lower lobe infiltrate with mild to moderate severity infiltrate within the adjacent portion of the left upper lobe. 2. Multiple, new ill-defined cavitary lung lesions involving predominantly the right upper lobe and left lower lobe which is concerning for sequelae associated with septic emboli. 3. Left-sided pleural drainage catheter positioning, as described above. 4. Small left pleural effusion which contains a tiny amount of pleural air  and is decreased in size when compared to the prior study. 5. Small to moderate severity anterior pneumomediastinum. 6. Cirrhotic liver with a large amount of ascites. Electronically Signed   By: Virgina Norfolk M.D.   On: 07/18/2020 02:29   US Abdomen Complete  Result Date: 06/22/2020 CLINICAL DATA:  Hepatic cirrhosis.  Esophageal carcinoma EXAM:  ABDOMEN ULTRASOUND COMPLETE COMPARISON:  Ultrasound right upper quadrant May 18, 2020; CT abdomen and pelvis May 16, 2020 FINDINGS: Gallbladder: Gallbladder wall is thickened diffusely and contracted. Suggestion of ring down type artifact from the gallbladder wall may indicate inflammation such as adenomyomatosis or cholesterolosis. No gallstones evident. No pericholecystic fluid. No sonographic Murphy sign noted by sonographer. Common bile duct: Diameter: 2 mm. No intrahepatic, common hepatic, or common bile duct dilatation. Liver: No focal lesion identified. The liver has a diffusely nodular contour. The liver is overall diminished in size. The liver echogenicity is increased and coarsened. Portal vein is patent on color Doppler imaging with normal direction of blood flow towards the liver. IVC: No abnormality visualized in regions that can be interrogated. Visualized portions of inferior vena cava appear unremarkable. Pancreas: Visualized portion unremarkable. Portions of pancreas obscured by gas. Spleen: Size and appearance within normal limits. Right Kidney: Length: 9.6 cm. Echogenicity within normal limits. No mass or hydronephrosis visualized. Left Kidney: Length: 10.4 cm. Echogenicity within normal limits. No mass or hydronephrosis visualized. Abdominal aorta: No aneurysm visualized. Other findings: There is moderate diffuse ascites. Right pleural effusion noted. IMPRESSION: 1. The appearance of the liver is consistent with cirrhosis. No focal liver lesions evident. Note that the sensitivity of ultrasound for detection of focal liver lesions is diminished in this circumstance. 2. Gallbladder is contracted with thickened wall. Ascites and hepatic cirrhosis may result in gallbladder wall thickening. No gallstones are evident. Note that there is a degree of ring down type artifact suggesting underlying adenomatosis or cholesterolosis within the gallbladder. 3. Portions of inferior vena cava and pancreas  obscured by gas. Visualized portions of the structures appear normal. 4.  Splenic size within normal limits. 5.  Ascites.  Right pleural effusion noted. Electronically Signed   By: Lowella Grip III M.D.   On: 06/22/2020 14:56   ECHOCARDIOGRAM COMPLETE  Result Date: 07/19/2020    ECHOCARDIOGRAM REPORT   Patient Name:   Matthew Reynolds Date of Exam: 07/19/2020 Medical Rec #:  [id number]      Height:       69.0 in Accession #:    [id number]     Weight:       124.1 lb Date of Birth:  1965/07/16       BSA:          1.687 m Patient Age:    39 years       BP:           109/84 mmHg Patient Gender: M              HR:           84 bpm. Exam Location:  Inpatient Procedure: 2D Echo, Cardiac Doppler and Color Doppler Indications:    Pericardial effusion  History:        Patient has prior history of Echocardiogram examinations, most                 recent 05/17/2020. Signs/Symptoms:Shortness of Breath. Cancer.  Sonographer:    Clayton Lefort RDCS (AE) Referring Phys: [place]  1. Abnormal septal motion . Left  ventricular ejection fraction, by estimation, is 60 to 65%. The left ventricle has normal function. The left ventricle has no regional wall motion abnormalities. Left ventricular diastolic parameters were normal.  2. Right ventricular systolic function is normal. The right ventricular size is normal.  3. The mitral valve is normal in structure. Trivial mitral valve regurgitation. No evidence of mitral stenosis.  4. The aortic valve is tricuspid. Aortic valve regurgitation is not visualized. No aortic stenosis is present.  5. The inferior vena cava is normal in size with greater than 50% respiratory variability, suggesting right atrial pressure of 3 mmHg. FINDINGS  Left Ventricle: Abnormal septal motion. Left ventricular ejection fraction, by estimation, is 60 to 65%. The left ventricle has normal function. The left ventricle has no regional wall motion abnormalities. The left ventricular internal cavity  size was normal in size. There is no left ventricular hypertrophy. Left ventricular diastolic parameters were normal. Right Ventricle: The right ventricular size is normal. No increase in right ventricular wall thickness. Right ventricular systolic function is normal. Left Atrium: Left atrial size was normal in size. Right Atrium: Right atrial size was normal in size. Pericardium: There is no evidence of pericardial effusion. Mitral Valve: The mitral valve is normal in structure. Trivial mitral valve regurgitation. No evidence of mitral valve stenosis. MV peak gradient, 2.5 mmHg. The mean mitral valve gradient is 2.0 mmHg. Tricuspid Valve: The tricuspid valve is normal in structure. Tricuspid valve regurgitation is trivial. No evidence of tricuspid stenosis. Aortic Valve: The aortic valve is tricuspid. Aortic valve regurgitation is not visualized. No aortic stenosis is present. Aortic valve mean gradient measures 2.0 mmHg. Aortic valve peak gradient measures 3.6 mmHg. Aortic valve area, by VTI measures 2.22 cm. Pulmonic Valve: The pulmonic valve was normal in structure. Pulmonic valve regurgitation is not visualized. No evidence of pulmonic stenosis. Aorta: The aortic root is normal in size and structure. Venous: The inferior vena cava is normal in size with greater than 50% respiratory variability, suggesting right atrial pressure of 3 mmHg. IAS/Shunts: No atrial level shunt detected by color flow Doppler.  LEFT VENTRICLE PLAX 2D LVIDd:         3.70 cm  Diastology LVIDs:         2.20 cm  LV e' medial:    8.70 cm/s LV PW:         1.00 cm  LV E/e' medial:  6.8 LV IVS:        0.80 cm  LV e' lateral:   11.80 cm/s LVOT diam:     1.80 cm  LV E/e' lateral: 5.0 LV SV:         33 LV SV Index:   20 LVOT Area:     2.54 cm  RIGHT VENTRICLE            IVC RV Basal diam:  2.70 cm    IVC diam: 1.10 cm RV S prime:     7.50 cm/s TAPSE (M-mode): 1.2 cm LEFT ATRIUM             Index       RIGHT ATRIUM           Index LA diam:         2.00 cm 1.19 cm/m  RA Area:     10.30 cm LA Vol (A2C):   17.5 ml 10.38 ml/m RA Volume:   21.70 ml  12.87 ml/m LA Vol (A4C):   22.7 ml 13.46 ml/m LA Biplane Vol: 21.6  ml 12.81 ml/m  AORTIC VALVE AV Area (Vmax):    1.96 cm AV Area (Vmean):   2.01 cm AV Area (VTI):     2.22 cm AV Vmax:           94.30 cm/s AV Vmean:          68.300 cm/s AV VTI:            0.150 m AV Peak Grad:      3.6 mmHg AV Mean Grad:      2.0 mmHg LVOT Vmax:         72.60 cm/s LVOT Vmean:        54.000 cm/s LVOT VTI:          0.131 m LVOT/AV VTI ratio: 0.87  AORTA Ao Root diam: 3.20 cm Ao Asc diam:  2.80 cm MITRAL VALVE MV Area (PHT): 2.34 cm    SHUNTS MV Area VTI:   1.36 cm    Systemic VTI:  0.13 m MV Peak grad:  2.5 mmHg    Systemic Diam: 1.80 cm MV Mean grad:  2.0 mmHg MV Vmax:       0.79 m/s MV Vmean:      63.3 cm/s MV Decel Time: 324 msec MV E velocity: 59.30 cm/s MV A velocity: 52.40 cm/s MV E/A ratio:  1.13 Jenkins Rouge MD Electronically signed by Jenkins Rouge MD Signature Date/Time: 07/19/2020/5:48:10 PM    Final    IR Paracentesis  Result Date: 06/23/2020 INDICATION: Patient with history of GE junction adenocarcinoma with recent development of ascites. Patient presents for diagnostic and therapeutic paracentesis. EXAM: ULTRASOUND GUIDED DIAGNOSTIC AND THERAPEUTIC PARACENTESIS MEDICATIONS: 10 mL 1% lidocaine COMPLICATIONS: None immediate. PROCEDURE: Informed written consent was obtained from the patient after a discussion of the risks, benefits and alternatives to treatment. A timeout was performed prior to the initiation of the procedure. Initial ultrasound scanning demonstrates a large amount of ascites within the right lower abdominal quadrant. The right lower abdomen was prepped and draped in the usual sterile fashion. 1% lidocaine was used for local anesthesia. Following this, a 19 gauge, 7-cm, Yueh catheter was introduced. An ultrasound image was saved for documentation purposes. The paracentesis was performed. The  catheter was removed and a dressing was applied. The patient tolerated the procedure well without immediate post procedural complication. FINDINGS: A total of approximately 5.1 liters of yellow fluid was removed. Samples were sent to the laboratory as requested by the clinical team. IMPRESSION: Successful ultrasound-guided paracentesis yielding 5.1 liters of peritoneal fluid. Read by: Brynda Greathouse PA-C Electronically Signed   By: Corrie Mckusick D.O.   On: 06/23/2020 15:57      Subjective: Wants to know when he can go home.   Discharge Exam: Vitals:   07/20/20 0813 07/20/20 1409  BP: (!) 112/92 121/82  Pulse: 89 (!) 102  Resp: 16 16  Temp: 97.7 F (36.5 C) 97.6 F (36.4 C)  SpO2: 100% 98%   Vitals:   07/20/20 0420 07/20/20 0421 07/20/20 0813 07/20/20 1409  BP:  111/81 (!) 112/92 121/82  Pulse: 93 95 89 (!) 102  Resp: 17  16 16   Temp: 97.6 F (36.4 C)  97.7 F (36.5 C) 97.6 F (36.4 C)  TempSrc: Oral  Oral Oral  SpO2:  100% 100% 98%  Weight: 56.8 kg     Height:        General: Pt is alert, awake, not in acute distress Cardiovascular: RRR, S1/S2 +, no rubs, no gallops Respiratory: CTA  bilaterally, no wheezing, no rhonchi Abdominal: Soft, NT, ND, bowel sounds + Extremities: no edema, no cyanosis    The results of significant diagnostics from this hospitalization (including imaging, microbiology, ancillary and laboratory) are listed below for reference.     Microbiology: Recent Results (from the past 240 hour(s))  Resp Panel by RT-PCR (Flu A&B, Covid) Nasopharyngeal Swab     Status: None   Collection Time: 07/18/20 12:05 AM   Specimen: Nasopharyngeal Swab; Nasopharyngeal(NP) swabs in vial transport medium  Result Value Ref Range Status   SARS Coronavirus 2 by RT PCR NEGATIVE NEGATIVE Final    Comment: (NOTE) SARS-CoV-2 target nucleic acids are NOT DETECTED.  The SARS-CoV-2 RNA is generally detectable in upper respiratory specimens during the acute phase of  infection. The lowest concentration of SARS-CoV-2 viral copies this assay can detect is 138 copies/mL. A negative result does not preclude SARS-Cov-2 infection and should not be used as the sole basis for treatment or other patient management decisions. A negative result may occur with  improper specimen collection/handling, submission of specimen other than nasopharyngeal swab, presence of viral mutation(s) within the areas targeted by this assay, and inadequate number of viral copies(<138 copies/mL). A negative result must be combined with clinical observations, patient history, and epidemiological information. The expected result is Negative.  Fact Sheet for Patients:  EntrepreneurPulse.com.au  Fact Sheet for Healthcare Providers:  IncredibleEmployment.be  This test is no t yet approved or cleared by the [place] FDA and  has been authorized for detection and/or diagnosis of SARS-CoV-2 by FDA under an Emergency Use Authorization (EUA). This EUA will remain  in effect (meaning this test can be used) for the duration of the COVID-19 declaration under Section 564(b)(1) of the Act, 21 U.S.C.section 360bbb-3(b)(1), unless the authorization is terminated  or revoked sooner.       Influenza A by PCR NEGATIVE NEGATIVE Final   Influenza B by PCR NEGATIVE NEGATIVE Final    Comment: (NOTE) The Xpert Xpress SARS-CoV-2/FLU/RSV plus assay is intended as an aid in the diagnosis of influenza from Nasopharyngeal swab specimens and should not be used as a sole basis for treatment. Nasal washings and aspirates are unacceptable for Xpert Xpress SARS-CoV-2/FLU/RSV testing.  Fact Sheet for Patients: EntrepreneurPulse.com.au  Fact Sheet for Healthcare Providers: IncredibleEmployment.be  This test is not yet approved or cleared by the [place] FDA and has been authorized for detection and/or diagnosis of SARS-CoV-2  by FDA under an Emergency Use Authorization (EUA). This EUA will remain in effect (meaning this test can be used) for the duration of the COVID-19 declaration under Section 564(b)(1) of the Act, 21 U.S.C. section 360bbb-3(b)(1), unless the authorization is terminated or revoked.  Performed at [facility], [address] [place], [place] [address]   Blood culture (routine x 2)     Status: None (Preliminary result)   Collection Time: 07/18/20 12:40 AM   Specimen: BLOOD  Result Value Ref Range Status   Specimen Description   Final    BLOOD Blood Culture results may not be optimal due to an inadequate volume of blood received in culture bottles Performed at [facility], [address] [place], [place] [address]    Special Requests   Final    BOTTLES DRAWN AEROBIC AND ANAEROBIC RIGHT ANTECUBITAL Performed at [facility], [address] [place], [place] [address]    Culture   Final    NO GROWTH 2 DAYS Performed at  [facility], [place] [address], [place], [place] [address]    Report Status PENDING  Incomplete  Blood culture (routine x 2)     Status: None (Preliminary result)   Collection Time: 07/18/20  2:55 AM   Specimen: BLOOD  Result Value Ref Range Status   Specimen Description   Final    BLOOD Blood Culture results may not be optimal due to an inadequate volume of blood received in culture bottles Performed at [facility], [address] [place], [place] [address]    Special Requests   Final    BOTTLES DRAWN AEROBIC AND ANAEROBIC LEFT ANTECUBITAL Performed at [facility], [address] [place], [place] [address]    Culture   Final    NO GROWTH 2 DAYS Performed at [facility], [place] [address], [place], [place] [address]    Report Status PENDING  Incomplete  MRSA PCR Screening     Status: None   Collection Time: 07/18/20 12:00 PM   Specimen: Nasopharyngeal  Result Value Ref Range Status   MRSA by  PCR NEGATIVE NEGATIVE Final    Comment:        The GeneXpert MRSA Assay (FDA approved for NASAL specimens only), is one component of a comprehensive MRSA colonization surveillance program. It is not intended to diagnose MRSA infection nor to guide or monitor treatment for MRSA infections. Performed at [facility], [place] [address], [place], [place] [address]      Labs: BNP (last 3 results) Recent Labs    05/16/20 1743  BNP 01.0   Basic Metabolic Panel: Recent Labs  Lab 07/18/20 0005 07/19/20 0500 07/20/20 0414  NA 138 142 143  K 5.1 4.4 3.7  CL 102 109 110  CO2 27 27 24   GLUCOSE 112* 87 63*  BUN 28* 31* 29*  CREATININE 0.65 0.73 0.70  CALCIUM 8.2* 8.2* 8.2*   Liver Function Tests: Recent Labs  Lab 07/18/20 0005  AST 35  ALT 30  ALKPHOS 189*  BILITOT 2.1*  PROT 5.3*  ALBUMIN 2.1*   No results for input(s): LIPASE, AMYLASE in the last 168 hours. No results for input(s): AMMONIA in the last 168 hours. CBC: Recent Labs  Lab 07/18/20 0005 07/19/20 0500 07/20/20 0414  WBC 1.0* 1.7* 2.5*  NEUTROABS 0.7*  --  1.7  HGB 14.2 12.5* 12.4*  HCT 43.5 38.5* 38.4*  MCV 88.4 88.5 90.1  PLT 79* PLATELET CLUMPS NOTED ON SMEAR, UNABLE TO ESTIMATE PLATELET CLUMPS NOTED ON SMEAR, UNABLE TO ESTIMATE   Cardiac Enzymes: No results for input(s): CKTOTAL, CKMB, CKMBINDEX, TROPONINI in the last 168 hours. BNP: Invalid input(s): POCBNP CBG: No results for input(s): GLUCAP in the last 168 hours. D-Dimer No results for input(s): DDIMER in the last 72 hours. Hgb A1c No results for input(s): HGBA1C in the last 72 hours. Lipid Profile No results for input(s): CHOL, HDL, LDLCALC, TRIG, CHOLHDL, LDLDIRECT in the last 72 hours. Thyroid function studies No results for input(s): TSH, T4TOTAL, T3FREE, THYROIDAB in the last 72 hours.  Invalid input(s): FREET3 Anemia work up No results for input(s): VITAMINB12, FOLATE, FERRITIN, TIBC, IRON, RETICCTPCT in the last 72  hours. Urinalysis    Component Value Date/Time   COLORURINE AMBER (A) 06/02/2020 0912   APPEARANCEUR HAZY (A) 06/02/2020 0912   LABSPEC 1.029 06/02/2020 0912   PHURINE 5.0 06/02/2020 0912   GLUCOSEU NEGATIVE 06/02/2020 0912   HGBUR NEGATIVE 06/02/2020 0912  BILIRUBINUR NEGATIVE 06/02/2020 0912   KETONESUR 5 (A) 06/02/2020 0912   PROTEINUR NEGATIVE 06/02/2020 0912   NITRITE NEGATIVE 06/02/2020 0912   LEUKOCYTESUR NEGATIVE 06/02/2020 0912   Sepsis Labs Invalid input(s): PROCALCITONIN,  WBC,  LACTICIDVEN Microbiology Recent Results (from the past 240 hour(s))  Resp Panel by RT-PCR (Flu A&B, Covid) Nasopharyngeal Swab     Status: None   Collection Time: 07/18/20 12:05 AM   Specimen: Nasopharyngeal Swab; Nasopharyngeal(NP) swabs in vial transport medium  Result Value Ref Range Status   SARS Coronavirus 2 by RT PCR NEGATIVE NEGATIVE Final    Comment: (NOTE) SARS-CoV-2 target nucleic acids are NOT DETECTED.  The SARS-CoV-2 RNA is generally detectable in upper respiratory specimens during the acute phase of infection. The lowest concentration of SARS-CoV-2 viral copies this assay can detect is 138 copies/mL. A negative result does not preclude SARS-Cov-2 infection and should not be used as the sole basis for treatment or other patient management decisions. A negative result may occur with  improper specimen collection/handling, submission of specimen other than nasopharyngeal swab, presence of viral mutation(s) within the areas targeted by this assay, and inadequate number of viral copies(<138 copies/mL). A negative result must be combined with clinical observations, patient history, and epidemiological information. The expected result is Negative.  Fact Sheet for Patients:  EntrepreneurPulse.com.au  Fact Sheet for Healthcare Providers:  IncredibleEmployment.be  This test is no t yet approved or cleared by the [place] FDA and  has been  authorized for detection and/or diagnosis of SARS-CoV-2 by FDA under an Emergency Use Authorization (EUA). This EUA will remain  in effect (meaning this test can be used) for the duration of the COVID-19 declaration under Section 564(b)(1) of the Act, 21 U.S.C.section 360bbb-3(b)(1), unless the authorization is terminated  or revoked sooner.       Influenza A by PCR NEGATIVE NEGATIVE Final   Influenza B by PCR NEGATIVE NEGATIVE Final    Comment: (NOTE) The Xpert Xpress SARS-CoV-2/FLU/RSV plus assay is intended as an aid in the diagnosis of influenza from Nasopharyngeal swab specimens and should not be used as a sole basis for treatment. Nasal washings and aspirates are unacceptable for Xpert Xpress SARS-CoV-2/FLU/RSV testing.  Fact Sheet for Patients: EntrepreneurPulse.com.au  Fact Sheet for Healthcare Providers: IncredibleEmployment.be  This test is not yet approved or cleared by the [place] FDA and has been authorized for detection and/or diagnosis of SARS-CoV-2 by FDA under an Emergency Use Authorization (EUA). This EUA will remain in effect (meaning this test can be used) for the duration of the COVID-19 declaration under Section 564(b)(1) of the Act, 21 U.S.C. section 360bbb-3(b)(1), unless the authorization is terminated or revoked.  Performed at [facility], [address] [place], [place] [address]   Blood culture (routine x 2)     Status: None (Preliminary result)   Collection Time: 07/18/20 12:40 AM   Specimen: BLOOD  Result Value Ref Range Status   Specimen Description   Final    BLOOD Blood Culture results may not be optimal due to an inadequate volume of blood received in culture bottles Performed at [facility], [address] [place], [place] [address]    Special Requests   Final    BOTTLES DRAWN AEROBIC AND ANAEROBIC RIGHT ANTECUBITAL Performed at [facility], [address] [place], [place] [address]    Culture   Final    NO GROWTH 2 DAYS Performed at [facility], [address], [place], [place] [address]    Report Status PENDING  Incomplete  Blood culture (routine x 2)     Status: None (Preliminary result)   Collection Time: 07/18/20  2:55 AM   Specimen: BLOOD  Result Value Ref Range Status   Specimen Description   Final    BLOOD Blood Culture results may not be optimal due to an inadequate volume of blood received in culture bottles Performed at [facility], [address] [place], [place] [address]    Special Requests   Final    BOTTLES DRAWN AEROBIC AND ANAEROBIC LEFT ANTECUBITAL Performed at [facility], [address] [place], [place] [address]    Culture   Final    NO GROWTH 2 DAYS Performed at [facility], [place] [address] [place], [place] [address]    Report Status PENDING  Incomplete  MRSA PCR Screening     Status: None   Collection Time: 07/18/20 12:00 PM   Specimen: Nasopharyngeal  Result Value Ref Range Status   MRSA by PCR NEGATIVE NEGATIVE Final    Comment:        The GeneXpert MRSA Assay (FDA approved for NASAL specimens only), is one component of a comprehensive MRSA colonization surveillance program. It is not intended to diagnose MRSA infection nor to guide or monitor treatment for MRSA infections. Performed at [facility], [place] [address] [place], [place] [address]      Time coordinating discharge: 36 minutes.  SIGNED:   Hosie Poisson, MD  [facility] 07/20/2020, 3:20 PM

## 2020-07-20 NOTE — Progress Notes (Signed)
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 where he thinks comfort care is the best approach for this patient.  Spoke with patient and his wife this morning, both seem to be leaning towards comfort care and do not want anything aggressive.  They have a meeting with palliative care later in the day to discuss goals of care.  I will hold off on further work-up for the lung lesions pending goals of care meeting today.  No new recommendations  Rest of the management as per the primary team. Thank you for the consult. Please page with pertinent questions or concerns.  ______________________________________________________________________ Subjective patient seen and examined at the  bedside.  Wife is very tearful.  Patient appears to be comfortable  Vitals BP (!) 112/92 (BP Location: Right Arm)   Pulse 89   Temp 97.7 F (36.5 C) (Oral)   Resp 16   Ht 5\' 9"  (1.753 m)   Wt 56.8 kg   SpO2 100%   BMI 18.50 kg/m     Physical Exam Elderly white male sitting in bed, appears to be comfortable, malnourished Pale Able to speak in full sentences Heart and lung sounds-within normal limit Abdomen soft and nontender Extremities no pedal edema Skin no rashes Neuro grossly nonfocal  Pertinent Microbiology Results for orders placed or performed during the hospital encounter of 07/17/20  Resp Panel by RT-PCR (Flu A&B, Covid) Nasopharyngeal Swab     Status: None   Collection Time: 07/18/20 12:05 AM   Specimen: Nasopharyngeal Swab; Nasopharyngeal(NP) swabs in vial transport medium  Result Value Ref Range Status   SARS Coronavirus 2 by RT PCR NEGATIVE NEGATIVE Final    Comment: (NOTE) SARS-CoV-2 target nucleic acids are NOT DETECTED.  The SARS-CoV-2 RNA is generally detectable in upper respiratory specimens during the acute phase of infection. The lowest concentration of SARS-CoV-2 viral copies this assay can detect is 138 copies/mL. A negative result does not preclude SARS-Cov-2 infection and should not be used as the sole basis for treatment or other patient management decisions. A negative result may occur with  improper specimen collection/handling, submission of specimen other than nasopharyngeal swab, presence of viral mutation(s) within the areas targeted by this assay, and inadequate number of viral copies(<138 copies/mL). A negative result must be combined with clinical observations, patient history, and epidemiological information. The expected result is Negative.  Fact Sheet for Patients:  EntrepreneurPulse.com.au  Fact Sheet for Healthcare Providers:  IncredibleEmployment.be  This test is no t yet approved or  cleared by the [place] FDA and  has been authorized for detection and/or diagnosis of SARS-CoV-2 by FDA under an Emergency Use Authorization (EUA). This EUA will remain  in effect (meaning this test can be used) for the duration of the COVID-19 declaration under Section 564(b)(1) of the Act, 21 U.S.C.section 360bbb-3(b)(1), unless the authorization is terminated  or revoked sooner.       Influenza A by PCR NEGATIVE NEGATIVE Final   Influenza B by PCR NEGATIVE NEGATIVE Final    Comment: (NOTE) The Xpert Xpress SARS-CoV-2/FLU/RSV plus assay is intended as an aid in the diagnosis of influenza from Nasopharyngeal swab specimens and should not be used as a sole basis for treatment. Nasal washings and aspirates are unacceptable for Xpert Xpress SARS-CoV-2/FLU/RSV testing.  Fact Sheet for Patients: EntrepreneurPulse.com.au  Fact Sheet for Healthcare Providers: IncredibleEmployment.be  This test is not yet approved or cleared by the [place] FDA and has been authorized for detection and/or diagnosis of SARS-CoV-2 by FDA under an Emergency Use Authorization (EUA). This EUA will remain in effect (meaning this test can be used) for the duration of the COVID-19 declaration under Section 564(b)(1) of the Act, 21 U.S.C. section 360bbb-3(b)(1), unless the authorization is terminated or revoked.  Performed at [facility], [address] [place], [place] [address]   Blood culture (routine x 2)     Status: None (Preliminary result)   Collection Time: 07/18/20 12:40 AM   Specimen: BLOOD  Result Value Ref Range Status   Specimen Description   Final    BLOOD Blood Culture results may not be optimal due to an inadequate volume of blood received in culture bottles Performed at [facility], [address] [place], [place] [address]    Special Requests   Final    BOTTLES DRAWN AEROBIC AND ANAEROBIC RIGHT  ANTECUBITAL Performed at [facility], [address] [place], [place] [address]    Culture   Final    NO GROWTH 2 DAYS Performed at [facility], [place] [address] [place], [place] [address]    Report Status PENDING  Incomplete  Blood culture (routine x 2)     Status: None (Preliminary result)   Collection Time: 07/18/20  2:55 AM   Specimen: BLOOD  Result Value Ref Range Status   Specimen Description   Final    BLOOD Blood Culture results may not be optimal due to an inadequate volume of blood received in culture bottles Performed at [facility], [address] [place], [place] [address]    Special Requests   Final    BOTTLES DRAWN AEROBIC AND ANAEROBIC LEFT ANTECUBITAL Performed at [facility], [address] [place], [place] [address]    Culture   Final    NO GROWTH 2 DAYS Performed at [facility], [place] [address] [place], [place] [address]    Report Status PENDING  Incomplete  MRSA PCR Screening     Status: None   Collection Time: 07/18/20 12:00 PM   Specimen: Nasopharyngeal  Result Value Ref Range Status   MRSA by PCR NEGATIVE NEGATIVE Final    Comment:        The GeneXpert MRSA Assay (FDA approved for NASAL specimens only), is one component of a comprehensive MRSA colonization surveillance program. It is  not intended to diagnose MRSA infection nor to guide or monitor treatment for MRSA infections. Performed at [facility], [place] [address], [place], [place] [address]     Pertinent Lab. CBC Latest Ref Rng & Units 07/20/2020 07/19/2020 07/18/2020  WBC 4.0 - 10.5 K/uL 2.5(L) 1.7(L) 1.0(LL)  Hemoglobin 13.0 - 17.0 g/dL 12.4(L) 12.5(L) 14.2  Hematocrit 39.0 - 52.0 % 38.4(L) 38.5(L) 43.5  Platelets 150 - 400 K/uL PLATELET CLUMPS NOTED ON SMEAR, UNABLE TO ESTIMATE PLATELET CLUMPS NOTED ON SMEAR, UNABLE TO ESTIMATE 79(L)   CMP Latest Ref Rng & Units 07/20/2020 07/19/2020 07/18/2020  Glucose 70 - 99 mg/dL 63(L) 87 112(H)  BUN  6 - 20 mg/dL 29(H) 31(H) 28(H)  Creatinine 0.61 - 1.24 mg/dL 0.70 0.73 0.65  Sodium 135 - 145 mmol/L 143 142 138  Potassium 3.5 - 5.1 mmol/L 3.7 4.4 5.1  Chloride 98 - 111 mmol/L 110 109 102  CO2 22 - 32 mmol/L 24 27 27   Calcium 8.9 - 10.3 mg/dL 8.2(L) 8.2(L) 8.2(L)  Total Protein 6.5 - 8.1 g/dL - - 5.3(L)  Total Bilirubin 0.3 - 1.2 mg/dL - - 2.1(H)  Alkaline Phos 38 - 126 U/L - - 189(H)  AST 15 - 41 U/L - - 35  ALT 0 - 44 U/L - - 30     Pertinent Imaging today Plain films and CT images have been personally visualized and interpreted; radiology reports have been reviewed. Decision making incorporated into the Impression / Recommendations.  I have spent more than 35 minutes for this patient encounter including review of prior medical records, coordination of care  with greater than 50% of time being face to face/counseling and discussing diagnostics/treatment plan with the patient/family.  Electronically signed by:   Rosiland Oz, MD Infectious Disease Physician [facility] Infectious Disease Pager: [phone]

## 2020-07-20 NOTE — Consult Note (Signed)
Consultation Note Date: 07/20/2020   Patient Name: Matthew Reynolds  DOB: 12-15-1965  MRN: [id number]  Age / Sex: 55 y.o., male  PCP: Cari Caraway, MD Referring Physician: Hosie Poisson, MD  Reason for Consultation: Establishing goals of care  HPI/Patient Profile: 55 y.o. male  with past medical history of GE junction adenocarcinoma with liver mets  admitted on 07/17/2020 with cough and shortness of breath. He was hospitalized from 3/21-22 by Dr. Roxan Hockey for VATS, pericardial window, and placement of Pleur-x catheter due to malignant effusions. During his last oncology visit 4/28 his weight was down 12 pounds and he was only able to take in liquids - received chemo at this visit. This admission, work-up revealed bilateral cavitary lesions on CT imaging of the chest: likely infectious in etiology, possibly septic emboli and not secondary to metastatic disease. Blood work revealed substantial neutropenia. Treated for sepsis from cavitary pneumonia. [name] saw patient and discussed very los prealbumin - discussed that he is no longer a candidate for chemo. Hospice was introduced during that conversation. PMT consulted for ongoing [place] discussions.   Clinical Assessment and Goals of Care: I have reviewed medical records including EPIC notes, labs and imaging, received received report from Dr. Karleen Hampshire, assessed the patient and then met with patient and his wife  to discuss diagnosis prognosis, GOC, EOL wishes, disposition and options.  I introduced Palliative Medicine as specialized medical care for people living with serious illness. It focuses on providing relief from the symptoms and stress of a serious illness. The goal is to improve quality of life for both the patient and the family.  We discussed a brief life review of the patient. They tell me about their 3 kids - daughter is 65 years old and twin boys are 55 years old. One of their sons graduates  from college this Saturday.   As far as functional and nutritional status, patient tells me about his weakness and need for assistance with ADLs. His wife and sons are able to help him - he can still walk to the bathroom as needed. He is only able to take in liquids, appetite is poor. He is sipping of Boost drinks for nutrients.     We discussed patient's current illness and what it means in the larger context of patient's on-going co-morbidities.  Natural disease trajectory and expectations at EOL were discussed. Patient understand he is nearing end of life with a prognosis of weeks.   I attempted to elicit values and goals of care important to the patient.  It is very important to the patient to spend time with his family and focus on quality of life now that he is no longer a candidate for any further treatment options.   They share about their plans for a beach trip the end of this month. They understand this is likely too far out for him - they have moved beach trip to leave this Sunday with their children. The home can accommodate Matthew Reynolds wheelchair and his sons and wife can help with transfers.   His main symptom complaint is his weakness - arranging wheelchair, wheelchair cushion, pads for his back, urinals. He has some pain that is well controlled by fentanyl patch and PRN liquid oxycodone - requesting refill of oxycodone.   They are interested in support of hospice at home and understand hospice philosophy of care.   Questions and concerns were addressed. The family was encouraged to call with questions or concerns.   Primary  Decision Maker PATIENT    SUMMARY OF RECOMMENDATIONS   Comfort measures Patient planning to go to beach with his family - leave Sunday Hospice support at discharge D/c today Refill liquid oxycodone Wheelchair at home  Code Status/Advance Care Planning:  DNR  Symptom Management:   Fentanyl patch and PRN liquid oxycodone  Prognosis:   < 6  weeks  Discharge Planning: Home with Hospice      Primary Diagnoses: Present on Admission: . Cancer, metastatic ([place]) . Chemotherapy-induced neutropenia ([place]) . 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 Malnutrition ([place])   I have reviewed the medical record, interviewed the patient and family, and examined the patient. The following aspects are pertinent.  Past Medical History:  Diagnosis Date  . 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 Goals of care, counseling/discussion 12/10/2017  . Iron deficiency anemia due to chronic blood loss 12/11/2017  . 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 Marital status: Married    Spouse name: Not on file  . Number of children: Not on file  . Years of education: Not on file  . Highest education level: Not on file  Occupational History  . Occupation: owns his own company  Tobacco Use  . Smoking status: Never Smoker  . Smokeless tobacco: Never Used  Vaping Use  . Vaping Use: Never used  Substance and Sexual Activity  . Alcohol use: Not Currently    Comment: socially  . Drug use: No  . Sexual activity: Not on file  Other Topics Concern  . Not on file  Social History Narrative  . Not on file   Social Determinants of Health   Financial Resource Strain: Not on file  Food Insecurity: Not on file  Transportation Needs: Not on file  Physical Activity: Not on file  Stress: Not on file  Social Connections: Not on file   History reviewed. No pertinent family history. Scheduled Meds: . Chlorhexidine Gluconate Cloth  6 each Topical Daily  . docusate sodium  100 mg Oral BID  . dronabinol  2.5 mg Oral BID AC  . enoxaparin (LOVENOX) injection  40 mg Subcutaneous Q24H  . feeding supplement  237 mL Oral TID BM  . fentaNYL  1 patch Transdermal Q72H  . lidocaine (PF)       . lidocaine-prilocaine  1 application Topical Once  . sodium chloride flush  10-40 mL Intracatheter Q12H  . sodium chloride flush  3 mL Intravenous Q12H   Continuous Infusions: . ceFEPime (MAXIPIME) IV 2 g (07/20/20 1249)  . vancomycin 750 mg (07/20/20 1045)   PRN Meds:.acetaminophen **OR** acetaminophen, albuterol, baclofen, bisacodyl, guaiFENesin, hydrALAZINE, morphine injection, ondansetron **OR** ondansetron (ZOFRAN) IV, polyethylene glycol, sodium chloride flush No Known Allergies Review of Systems  Constitutional: Positive for activity change and appetite change.  Neurological: Positive for weakness.    Physical Exam Constitutional:      General: He is not in acute distress. Pulmonary:     Effort: Pulmonary effort is normal.  Skin:    General: Skin is warm and dry.  Neurological:     Mental Status: He is alert and oriented to person, place, and time.  Psychiatric:        Mood and Affect: Mood normal.        Behavior: Behavior normal.     Vital Signs: BP 121/82 (BP Location: Left Arm)   Pulse (!) 102  Temp 97.6 F (36.4 C) (Oral)   Resp 16   Ht 5' 9" (1.753 m)   Wt 56.8 kg   SpO2 98%   BMI 18.50 kg/m  Pain Scale: 0-10   Pain Score: 3    SpO2: SpO2: 98 % O2 Device:SpO2: 98 % O2 Flow Rate: .   IO: Intake/output summary:   Intake/Output Summary (Last 24 hours) at 07/20/2020 1554 Last data filed at 07/20/2020 1249 Gross per 24 hour  Intake 840 ml  Output 250 ml  Net 590 ml    LBM: Last BM Date: 07/17/20 Baseline Weight: Weight: 55.8 kg Most recent weight: Weight: 56.8 kg     Palliative Assessment/Data: PPS 50%    Time Total: 65 minutes Greater than 50%  of this time was spent counseling and coordinating care related to the above assessment and plan.  Juel Burrow, DNP, AGNP-C Palliative Medicine Team [phone] Pager: [phone]

## 2020-07-20 NOTE — Progress Notes (Signed)
      [address]       [place] [address]             [phone]       HPI:   Mr. 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 Admitted with cough and shortness of breath. Also increased dysphagia and only able to take liquids. Severely leukopenic. CT showed left lower lobe consolidation and a cavitary lesion in the right lung. Small pleural effusion and some residual pneumomediastinum. SQ emphysema. Started on Maxipime for pneumonia. We did a VATS for pleural and pericardial biopsies and placement of a pleural catheter in March He has been draining once a week with minimal output.   Recent CXR done 07/17/2020:   1. Progressive multifocal bilateral airspace disease. 2. Small left effusion. 3. Resolution of the pneumomediastinum seen previously. Near complete resolution of the chest wall subcutaneous emphysema. 4. Support devices as above  Procedure: Pleurx catheter removal  Consent: verbal consent obtained  Procedure:   The patient was draped with 4 blue towels and prepped with iodine swabs in a sterile fashion. Nursing administered morphine in his IV for comfort. 8 cc of 1% lidocaine was injected into the skin around the pleurx catheter insertion site until skin was anesthestized. The suture was cut, the cuff of the catheter was carefully dissected with the assistance of [place] Cutter who provided sterile gentle tension on the tube and retraction. Once the cuff was freed, the tube was pulled without resistance. There was a small slit in the tube from the dissection process. Pressure was applied to the incision, then a 3.0 silk stitch was placed bringing the skin together. 2 x 2 sterile gauze and a Tegaderm was placed to dress the incision.   Complications: none  Post-procedure: Patient states that he did not have any pain during the procedure only some pressure at the incision  site.  Instructions: Please leave the dressing in place for 24 hours. After that you may wash the incision with soap and water in the shower. Keep the stitch in place for 7-10 days. I would recommend waiting a few weeks before submerging in water (like a pool), however submerging feet/legs would be just fine.   Nicholes Rough, PA-C

## 2020-07-20 NOTE — Progress Notes (Signed)
Drained patient's Pleur-x catheter and got about 10cc of fluid. Patient tolerated it well, vitals remained stable.

## 2020-07-21 ENCOUNTER — Encounter: Payer: Self-pay | Admitting: Hematology & Oncology

## 2020-07-21 LAB — QUANTIFERON-TB GOLD PLUS (RQFGPL)
QuantiFERON Mitogen Value: 1.52 IU/mL
QuantiFERON Nil Value: 0.01 IU/mL
QuantiFERON TB1 Ag Value: 0.01 IU/mL
QuantiFERON TB2 Ag Value: 0.03 IU/mL

## 2020-07-21 LAB — QUANTIFERON-TB GOLD PLUS: QuantiFERON-TB Gold Plus: NEGATIVE

## 2020-07-21 NOTE — Progress Notes (Signed)
[facility]:  Spoke to the pt's wife over phone to confirm interest in hospice care. She and her husband are about to leave to go home. I offered for them to call us when he got home for our nurse to come out his evening. (07/20/20) The pt and his wife both agreed that tomorrow would be better. Therefore I offered the additional time frames we had available for Friday 07/21/20  They agree to allow [place] to come out to home for enrollment into hospice services at 200pm Friday. Webb Silversmith RN

## 2020-07-21 NOTE — Care Management (Signed)
07-21-20 1736 Late Entry: Case Manager received a secure chat from the Palliative Care NP regarding home with Hospice Services. The Palliative Care NP contacted [facility] for services. Case Manager confirmed that [facility] has accepted the patient and start of care to begin 07-21-20. Bethena Roys, RN,BSN Case Manager

## 2020-07-22 LAB — FUNGITELL, SERUM: Fungitell Result: <31 pg/mL (ref <80)

## 2020-07-23 LAB — CULTURE, BLOOD (ROUTINE X 2)
Culture: NO GROWTH
Culture: NO GROWTH

## 2020-07-24 ENCOUNTER — Other Ambulatory Visit (HOSPITAL_BASED_OUTPATIENT_CLINIC_OR_DEPARTMENT_OTHER): Payer: Self-pay

## 2020-07-24 LAB — BLASTOMYCES ANTIGEN: Blastomyces Antigen: NOT DETECTED ng/mL

## 2020-07-27 ENCOUNTER — Inpatient Hospital Stay: Payer: BC Managed Care – PPO

## 2020-07-27 ENCOUNTER — Inpatient Hospital Stay: Payer: BC Managed Care – PPO | Admitting: Hematology & Oncology

## 2020-07-31 ENCOUNTER — Other Ambulatory Visit (HOSPITAL_COMMUNITY): Payer: Self-pay

## 2020-07-31 MED ORDER — LORAZEPAM 0.5 MG PO TABS
0.5000 mg | ORAL_TABLET | ORAL | 4 refills | Status: AC | PRN
  Filled 2020-07-31: qty 30, 5d supply, fill #0

## 2020-08-03 ENCOUNTER — Inpatient Hospital Stay: Payer: BC Managed Care – PPO

## 2020-08-07 ENCOUNTER — Other Ambulatory Visit: Payer: Self-pay | Admitting: *Deleted

## 2020-08-07 ENCOUNTER — Telehealth: Payer: Self-pay | Admitting: *Deleted

## 2020-08-07 NOTE — Telephone Encounter (Signed)
Received notification from [facility] that Yurem passed away June 12, 2022.

## 2020-08-07 NOTE — Telephone Encounter (Signed)
Received notification from [facility] that patient expired May 22 at 6:41pm.  Dr Marin Olp made aware.  Flowers sent

## 2020-08-10 ENCOUNTER — Inpatient Hospital Stay: Payer: BC Managed Care – PPO

## 2020-08-10 ENCOUNTER — Inpatient Hospital Stay: Payer: BC Managed Care – PPO | Admitting: Hematology & Oncology

## 2020-08-16 DEATH — deceased

## 2020-09-26 ENCOUNTER — Encounter: Payer: Self-pay | Admitting: *Deleted

## 2022-03-01 IMAGING — CT CT CHEST W/ CM
2 of 5 series · 13 of 36 positions shown, 16 images · IV contrast (Omnipaque)
Comparison: PET-CT 01/27/2020, 08/25/2019

CLINICAL DATA: Gastrointestinal carcinoma surveillance. GE junction
adenocarcinoma. Chemotherapy ongoing

EXAM:
CT CHEST, ABDOMEN, AND PELVIS WITH CONTRAST
TECHNIQUE: Multidetector CT imaging of the chest, abdomen and pelvis was
performed following the standard protocol during bolus
administration of intravenous contrast.
CONTRAST:  100mL OMNIPAQUE IOHEXOL 300 MG/ML  SOLN

[Series 2: cap with 2 · axial · 0.80mm/px · z∈[-647,-57]mm · 10 of 146 slices shown, 13 images]
[im 14/146  mediastinal]
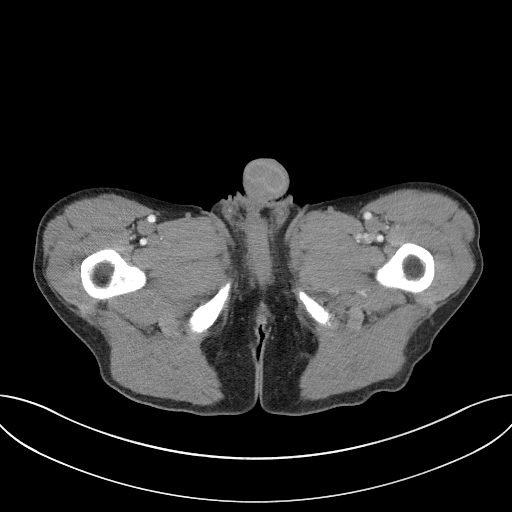
[im 14/146  lung]
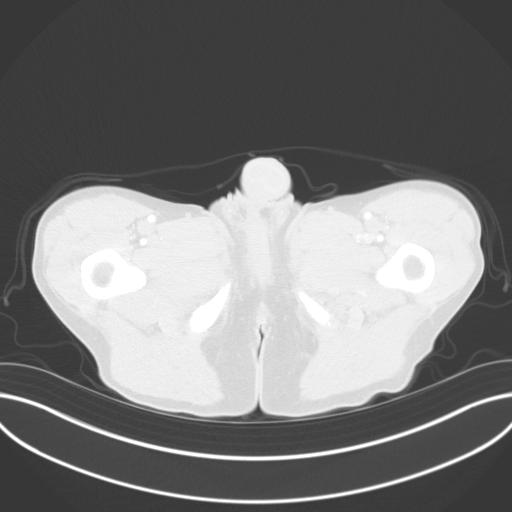
[im 27/146  lung]
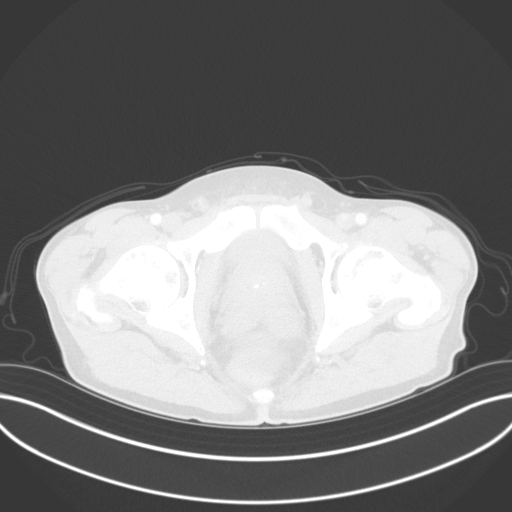
[im 40/146  lung]
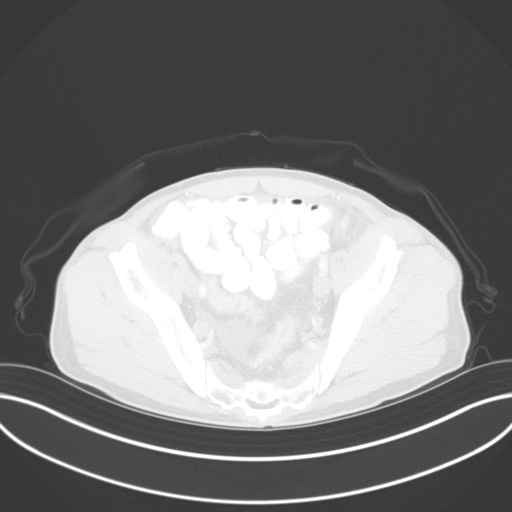
[im 53/146  lung]
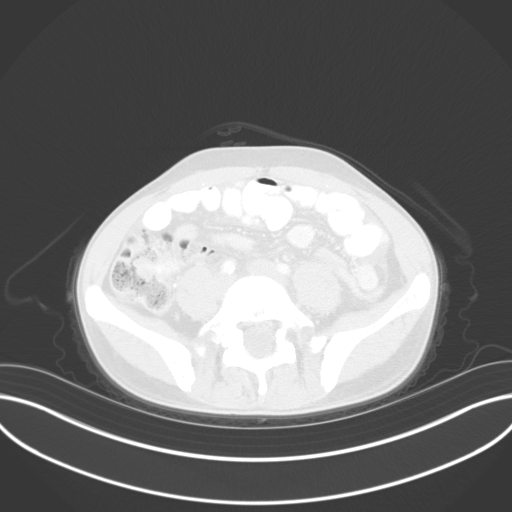
[im 66/146  mediastinal]
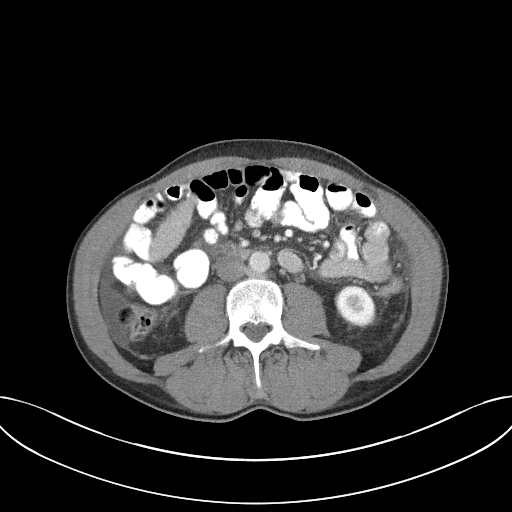
[im 66/146  lung]
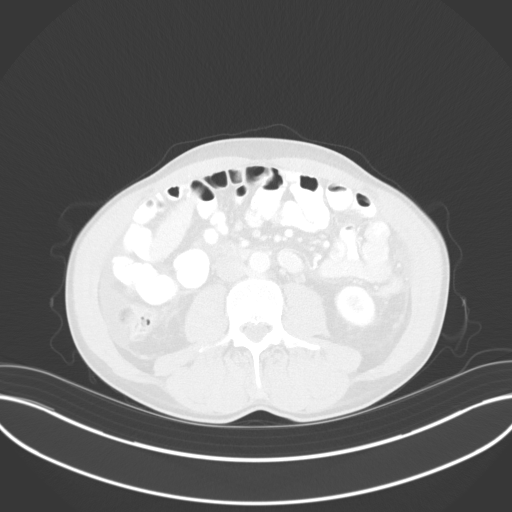
[im 80/146  lung]
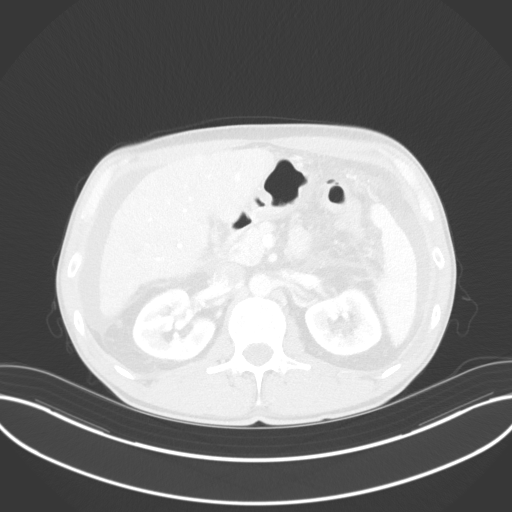
[im 93/146  lung]
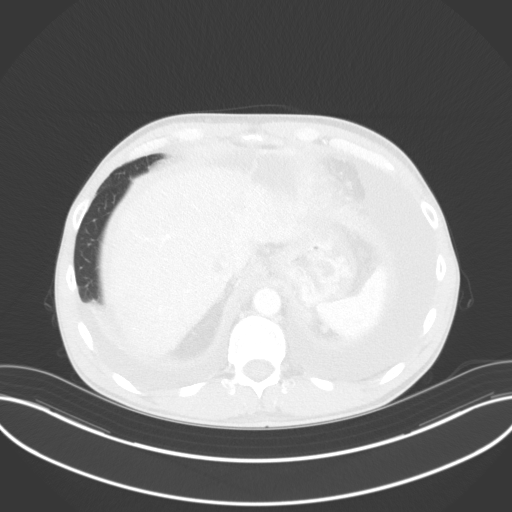
[im 106/146  lung]
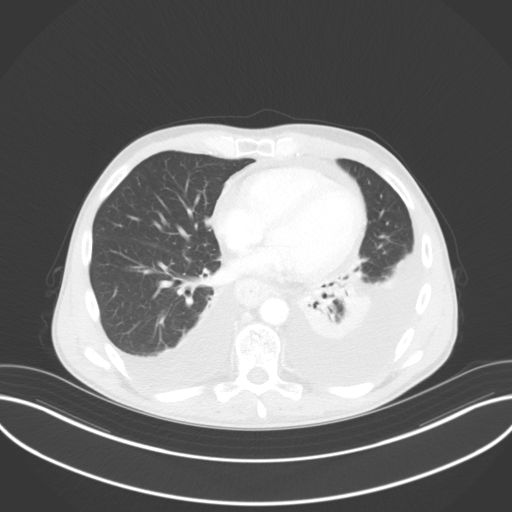
[im 119/146  mediastinal]
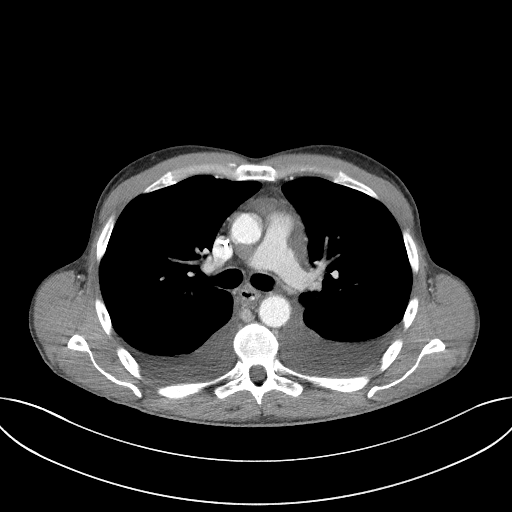
[im 119/146  lung]
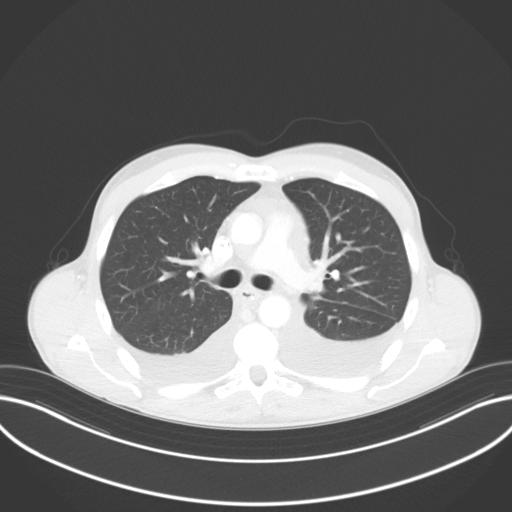
[im 132/146  lung]
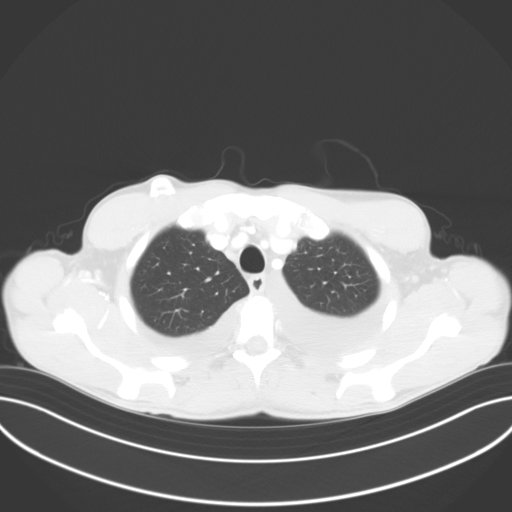

[Series 5: coronals · coronal · 0.82mm/px · 3 of 128 slices shown]
[im 26/128  lung]
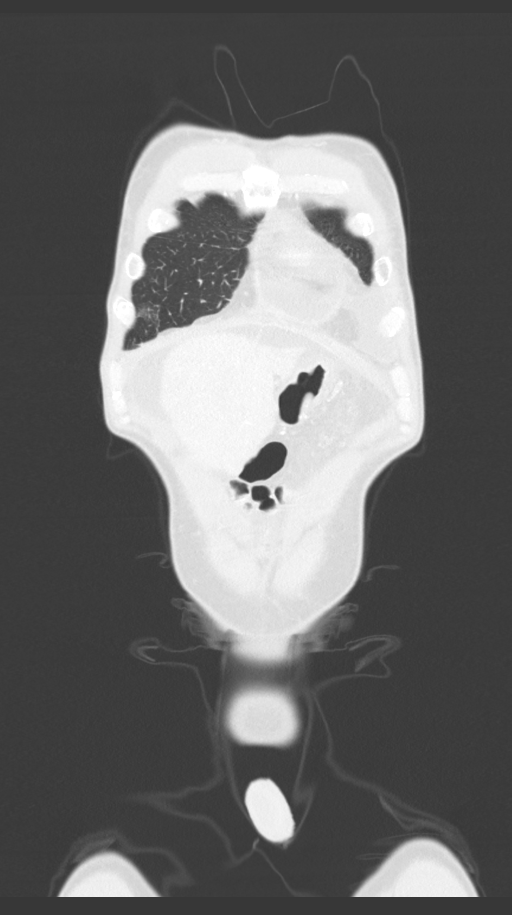
[im 51/128  lung]
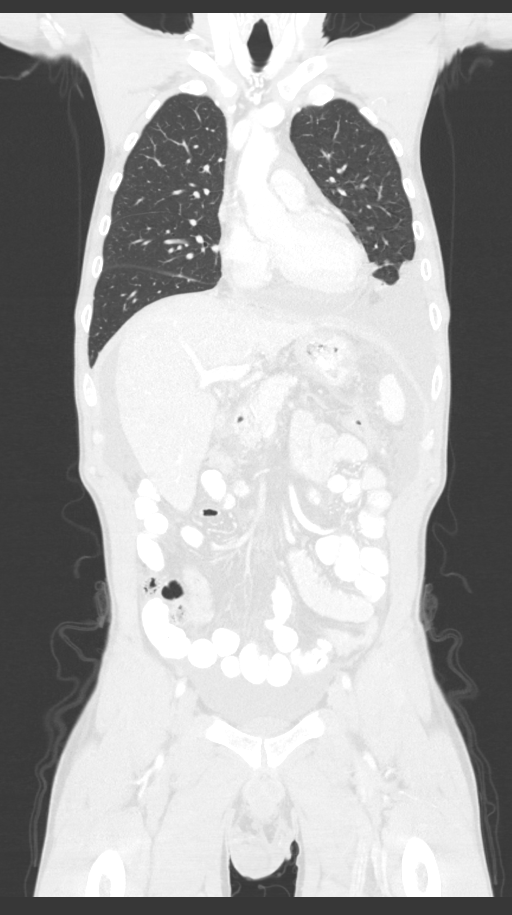
[im 77/128  lung]
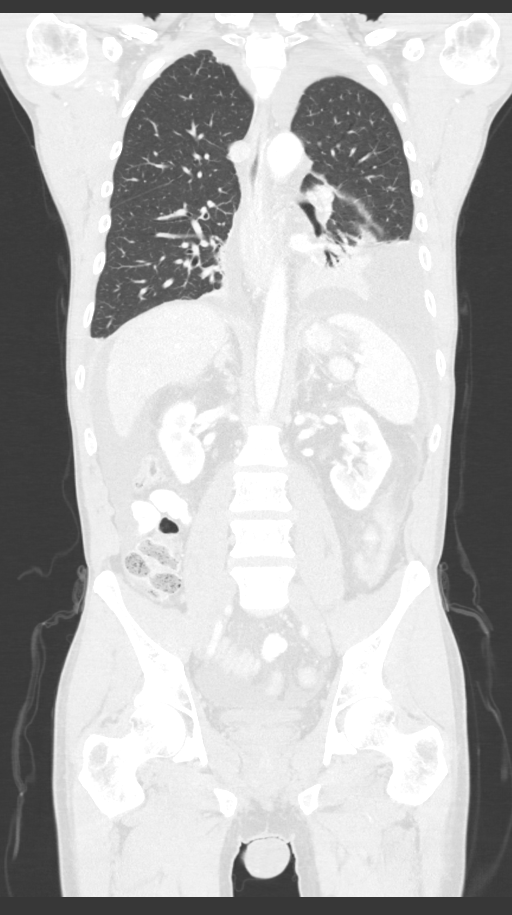

[13 of 36 positions shown; findings below may reference images not displayed]

FINDINGS: CT CHEST FINDINGS

Cardiovascular: Port in the anterior chest wall with tip in distal
SVC. Small pericardial effusion is new from prior. Fusion is most
prominent along the base the heart measuring 13 mm in depth (image
41/coronal series 5).

Mediastinum/Nodes: Continued mild esophageal wall thickening through
the distal esophagus up to the GE junction. No change from prior. No
mass lesion or obstruction.

No enlarged mediastinal lymph nodes.

Lungs/Pleura: Bilateral moderate pleural effusions are new from
prior. New bibasilar passive atelectasis.

Musculoskeletal: No aggressive osseous lesion.

CT ABDOMEN AND PELVIS FINDINGS

Hepatobiliary: Liver has a fine nodular contour. No enhancing
lesion. Gallbladder is collapsed.

Moderate volume of intraperitoneal free fluid along the margin the
RIGHT hepatic lobe. Portal veins are patent.

Pancreas: Pancreas is normal. No ductal dilatation. No pancreatic
inflammation.

Spleen: Normal spleen

Adrenals/urinary tract: Adrenal glands and kidneys are normal. The
ureters and bladder normal.

Stomach/Bowel: Stomach, small bowel, appendix, and cecum are normal.
The colon and rectosigmoid colon are normal.

Vascular/Lymphatic: Abdominal aorta is normal caliber. There is no
retroperitoneal or periportal lymphadenopathy. No pelvic
lymphadenopathy.

Reproductive: Prostate normal

Other: Moderate volume of intraperitoneal free fluid extends along
the pericolic gutters into the pelvis. New from prior

Musculoskeletal: No aggressive osseous lesion. Bilateral pars
defects at L5 with grade 1 anterolisthesis.
IMPRESSION: Chest Impression:

1. New pericardial effusion and moderate bilateral pleural
effusions.
2. No evidence of esophageal cancer recurrence. Stable thickening
through the distal esophagus.
3. No pulmonary metastasis or mediastinal adenopathy.

Abdomen / Pelvis Impression:

1. No metastatic disease in the abdomen pelvis.
2. New intraperitoneal free fluid in the abdomen pelvis.
3. With new pericardial effusion, pleural effusions, and
intraperitoneal free fluid query cardiac dysfunction or cardiac
toxicity.

These results will be called to the ordering clinician or
representative by the Radiologist Assistant, and communication
documented in the PACS or [REDACTED].

## 2022-03-02 IMAGING — DX DG CHEST 1V PORT
1 series · 1 of 1 positions shown · non-contrast
Comparison: May 16, 2020

CLINICAL DATA: Pleural effusion

EXAM:
PORTABLE CHEST 1 VIEW

[chest ap]
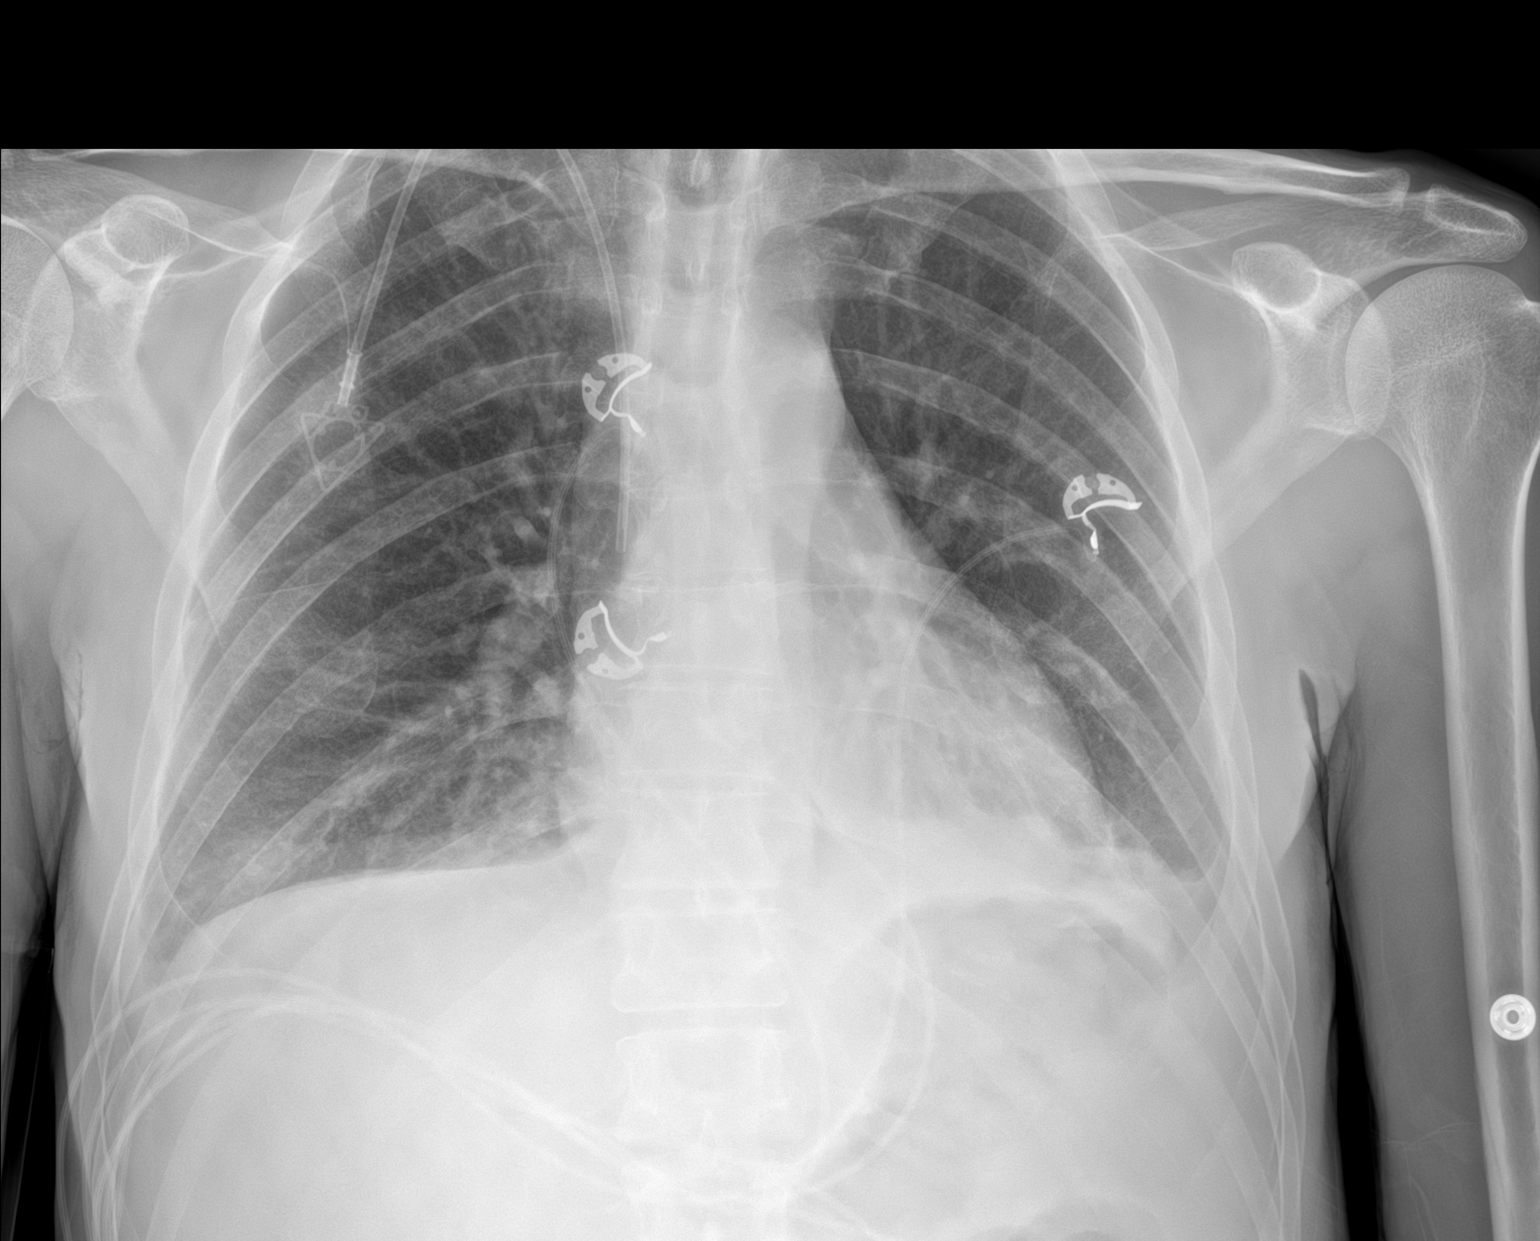

[1 of 1 positions shown; findings below may reference images not displayed]

FINDINGS: The heart size and mediastinal contours are within normal limits. A
trace left pleural effusion is present, decreased in size from the
prior exam. A right-sided MediPort catheter seen with the tip at the
superior cavoatrial junction. There is prominence of the central
pulmonary vasculature. No acute osseous abnormality.
IMPRESSION: .

Mild pulmonary vascular congestion

Interval decreased size in the left pleural effusion
